# Patient Record
Sex: Female | Born: 1940 | Race: White | Hispanic: No | Smoking: Never smoker
Health system: Southern US, Community
[De-identification: ages and names within clinical notes are randomized; demographics above are authoritative.]

## PROBLEM LIST (undated history)

## (undated) DIAGNOSIS — C801 Malignant (primary) neoplasm, unspecified: Secondary | ICD-10-CM

## (undated) DIAGNOSIS — R002 Palpitations: Secondary | ICD-10-CM

## (undated) DIAGNOSIS — I509 Heart failure, unspecified: Secondary | ICD-10-CM

## (undated) DIAGNOSIS — I10 Essential (primary) hypertension: Secondary | ICD-10-CM

## (undated) DIAGNOSIS — E119 Type 2 diabetes mellitus without complications: Secondary | ICD-10-CM

## (undated) DIAGNOSIS — H9190 Unspecified hearing loss, unspecified ear: Secondary | ICD-10-CM

## (undated) HISTORY — PX: CHOLECYSTECTOMY: SHX55

## (undated) HISTORY — PX: DILATION AND CURETTAGE OF UTERUS: SHX78

## (undated) HISTORY — PX: STOMACH SURGERY: SHX791

---

## 1988-09-23 HISTORY — PX: ABDOMINAL HYSTERECTOMY: SHX81

## 2005-12-17 ENCOUNTER — Ambulatory Visit: Payer: Self-pay | Admitting: Otolaryngology

## 2006-06-24 ENCOUNTER — Ambulatory Visit: Payer: Self-pay | Admitting: Internal Medicine

## 2008-03-30 ENCOUNTER — Emergency Department: Payer: Self-pay | Admitting: Emergency Medicine

## 2008-03-30 HISTORY — PX: NECK SURGERY: SHX720

## 2008-12-22 ENCOUNTER — Ambulatory Visit: Payer: Self-pay | Admitting: Internal Medicine

## 2015-01-05 ENCOUNTER — Ambulatory Visit
Admit: 2015-01-05 | Disposition: A | Discharge status: Home / Self Care | Payer: Self-pay | Attending: Internal Medicine | Admitting: Internal Medicine

## 2015-01-12 ENCOUNTER — Ambulatory Visit
Admit: 2015-01-12 | Disposition: A | Discharge status: Home / Self Care | Payer: Self-pay | Attending: Internal Medicine | Admitting: Internal Medicine

## 2015-01-23 ENCOUNTER — Other Ambulatory Visit
Admission: RE | Admit: 2015-01-23 | Discharge: 2015-01-23 | Disposition: A | Discharge status: Home / Self Care | Payer: Medicare Other | Source: Ambulatory Visit | Attending: Urgent Care | Admitting: Urgent Care

## 2015-01-23 DIAGNOSIS — R160 Hepatomegaly, not elsewhere classified: Secondary | ICD-10-CM | POA: Insufficient documentation

## 2015-01-23 LAB — CBC
HEMATOCRIT: 33.5 % — AB (ref 35.0–47.0)
HEMOGLOBIN: 10.5 g/dL — AB (ref 12.0–16.0)
MCH: 23.4 pg — ABNORMAL LOW (ref 26.0–34.0)
MCHC: 31.4 g/dL — AB (ref 32.0–36.0)
MCV: 74.6 fL — ABNORMAL LOW (ref 80.0–100.0)
Platelets: 412 10*3/uL (ref 150–440)
RBC: 4.49 MIL/uL (ref 3.80–5.20)
RDW: 17.7 % — ABNORMAL HIGH (ref 11.5–14.5)
WBC: 16 10*3/uL — ABNORMAL HIGH (ref 3.6–11.0)

## 2015-01-23 LAB — HEPATIC FUNCTION PANEL
ALK PHOS: 52 U/L (ref 38–126)
ALT: 11 U/L — AB (ref 14–54)
AST: 15 U/L (ref 15–41)
Albumin: 3.1 g/dL — ABNORMAL LOW (ref 3.5–5.0)
Bilirubin, Direct: 0.2 mg/dL (ref 0.1–0.5)
Indirect Bilirubin: 0.4 mg/dL (ref 0.3–0.9)
Total Bilirubin: 0.6 mg/dL (ref 0.3–1.2)
Total Protein: 7 g/dL (ref 6.5–8.1)

## 2015-01-23 LAB — PROTIME-INR
INR: 1.06
Prothrombin Time: 14 seconds (ref 11.4–15.0)

## 2015-01-24 LAB — AFP TUMOR MARKER: AFP TUMOR MARKER: 2 ng/mL (ref 0.0–8.3)

## 2015-01-24 LAB — CEA (SERIAL MONITOR): CEA: 0.9 ng/mL (ref 0.0–4.7)

## 2015-02-17 ENCOUNTER — Encounter: Payer: Self-pay | Admitting: Urgent Care

## 2015-02-27 ENCOUNTER — Telehealth: Payer: Self-pay | Admitting: Urgent Care

## 2015-02-27 NOTE — Telephone Encounter (Signed)
Please call [facility] & get 5/23 notes from Consult with Dr Patsy [place].  It is not in EPIC. Thanks

## 2015-02-27 NOTE — Telephone Encounter (Signed)
Phoned Dr.Zacks office at [facility] spoke with nurse lisa who said she will fax over last progress note in May

## 2015-02-27 NOTE — Telephone Encounter (Signed)
Records have been requested by fax from Dr Patsy Baltimore

## 2015-02-28 DIAGNOSIS — E785 Hyperlipidemia, unspecified: Secondary | ICD-10-CM | POA: Insufficient documentation

## 2015-02-28 DIAGNOSIS — E039 Hypothyroidism, unspecified: Secondary | ICD-10-CM | POA: Insufficient documentation

## 2015-02-28 DIAGNOSIS — E119 Type 2 diabetes mellitus without complications: Secondary | ICD-10-CM | POA: Insufficient documentation

## 2015-02-28 NOTE — Telephone Encounter (Signed)
Dr.Zacks last progress note has been uploaded to the chart under media tab

## 2015-03-14 ENCOUNTER — Ambulatory Visit: Payer: Self-pay | Admitting: Urgent Care

## 2015-06-12 ENCOUNTER — Other Ambulatory Visit: Payer: Self-pay | Admitting: Internal Medicine

## 2015-06-12 DIAGNOSIS — R1084 Generalized abdominal pain: Secondary | ICD-10-CM

## 2015-06-14 ENCOUNTER — Ambulatory Visit
Admission: RE | Admit: 2015-06-14 | Discharge: 2015-06-14 | Disposition: A | Discharge status: Home / Self Care | Payer: Medicare Other | Source: Ambulatory Visit | Attending: Internal Medicine | Admitting: Internal Medicine

## 2015-06-14 DIAGNOSIS — Z9049 Acquired absence of other specified parts of digestive tract: Secondary | ICD-10-CM | POA: Diagnosis not present

## 2015-06-14 DIAGNOSIS — R160 Hepatomegaly, not elsewhere classified: Secondary | ICD-10-CM | POA: Diagnosis not present

## 2015-06-14 DIAGNOSIS — R1084 Generalized abdominal pain: Secondary | ICD-10-CM | POA: Diagnosis present

## 2018-01-03 ENCOUNTER — Emergency Department: Payer: Medicare Other

## 2018-01-03 ENCOUNTER — Encounter: Payer: Self-pay | Admitting: Emergency Medicine

## 2018-01-03 ENCOUNTER — Emergency Department
Admission: EM | Admit: 2018-01-03 | Discharge: 2018-01-03 | Disposition: A | Discharge status: Home / Self Care | Payer: Medicare Other | Attending: Emergency Medicine | Admitting: Emergency Medicine

## 2018-01-03 ENCOUNTER — Other Ambulatory Visit: Payer: Self-pay

## 2018-01-03 DIAGNOSIS — Z7982 Long term (current) use of aspirin: Secondary | ICD-10-CM | POA: Insufficient documentation

## 2018-01-03 DIAGNOSIS — Z7984 Long term (current) use of oral hypoglycemic drugs: Secondary | ICD-10-CM | POA: Diagnosis not present

## 2018-01-03 DIAGNOSIS — S50311A Abrasion of right elbow, initial encounter: Secondary | ICD-10-CM | POA: Diagnosis not present

## 2018-01-03 DIAGNOSIS — Y92481 Parking lot as the place of occurrence of the external cause: Secondary | ICD-10-CM | POA: Insufficient documentation

## 2018-01-03 DIAGNOSIS — Z85028 Personal history of other malignant neoplasm of stomach: Secondary | ICD-10-CM | POA: Diagnosis not present

## 2018-01-03 DIAGNOSIS — E119 Type 2 diabetes mellitus without complications: Secondary | ICD-10-CM | POA: Diagnosis not present

## 2018-01-03 DIAGNOSIS — Z23 Encounter for immunization: Secondary | ICD-10-CM | POA: Insufficient documentation

## 2018-01-03 DIAGNOSIS — Y998 Other external cause status: Secondary | ICD-10-CM | POA: Insufficient documentation

## 2018-01-03 DIAGNOSIS — T148XXA Other injury of unspecified body region, initial encounter: Secondary | ICD-10-CM | POA: Insufficient documentation

## 2018-01-03 DIAGNOSIS — I10 Essential (primary) hypertension: Secondary | ICD-10-CM | POA: Diagnosis not present

## 2018-01-03 DIAGNOSIS — S8992XA Unspecified injury of left lower leg, initial encounter: Secondary | ICD-10-CM | POA: Diagnosis present

## 2018-01-03 DIAGNOSIS — Z85828 Personal history of other malignant neoplasm of skin: Secondary | ICD-10-CM | POA: Insufficient documentation

## 2018-01-03 DIAGNOSIS — Y9301 Activity, walking, marching and hiking: Secondary | ICD-10-CM | POA: Insufficient documentation

## 2018-01-03 HISTORY — DX: Malignant (primary) neoplasm, unspecified: C80.1

## 2018-01-03 HISTORY — DX: Type 2 diabetes mellitus without complications: E11.9

## 2018-01-03 HISTORY — DX: Essential (primary) hypertension: I10

## 2018-01-03 LAB — BASIC METABOLIC PANEL
Anion gap: 8 (ref 5–15)
BUN: 10 mg/dL (ref 6–20)
CALCIUM: 8.8 mg/dL — AB (ref 8.9–10.3)
CO2: 27 mmol/L (ref 22–32)
CREATININE: 0.78 mg/dL (ref 0.44–1.00)
Chloride: 102 mmol/L (ref 101–111)
GFR calc Af Amer: >60 mL/min (ref >60)
GFR calc non Af Amer: >60 mL/min (ref >60)
GLUCOSE: 169 mg/dL — AB (ref 65–99)
Potassium: 3.5 mmol/L (ref 3.5–5.1)
Sodium: 137 mmol/L (ref 135–145)

## 2018-01-03 LAB — CBC WITH DIFFERENTIAL/PLATELET
Basophils Absolute: 0.1 10*3/uL (ref 0–0.1)
Basophils Relative: 1 %
EOS ABS: 0.2 10*3/uL (ref 0–0.7)
Eosinophils Relative: 2 %
HEMATOCRIT: 36.1 % (ref 35.0–47.0)
Hemoglobin: 11.8 g/dL — ABNORMAL LOW (ref 12.0–16.0)
Lymphocytes Relative: 16 %
Lymphs Abs: 1.7 10*3/uL (ref 1.0–3.6)
MCH: 25.5 pg — ABNORMAL LOW (ref 26.0–34.0)
MCHC: 32.6 g/dL (ref 32.0–36.0)
MCV: 78.4 fL — ABNORMAL LOW (ref 80.0–100.0)
MONO ABS: 0.5 10*3/uL (ref 0.2–0.9)
MONOS PCT: 4 %
Neutro Abs: 7.9 10*3/uL — ABNORMAL HIGH (ref 1.4–6.5)
Neutrophils Relative %: 77 %
Platelets: 286 10*3/uL (ref 150–440)
RBC: 4.61 MIL/uL (ref 3.80–5.20)
RDW: 17.3 % — AB (ref 11.5–14.5)
WBC: 10.3 10*3/uL (ref 3.6–11.0)

## 2018-01-03 MED ORDER — TETANUS-DIPHTH-ACELL PERTUSSIS 5-2.5-18.5 LF-MCG/0.5 IM SUSP
0.5000 mL | Freq: Once | INTRAMUSCULAR | Status: AC
  Administered 2018-01-03: 0.5 mL via INTRAMUSCULAR
  Filled 2018-01-03: qty 0.5
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
## 2018-01-03 NOTE — ED Notes (Signed)
Patient transported to radiology

## 2018-01-03 NOTE — ED Notes (Signed)
Patient transported to XR. 
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
## 2018-01-03 NOTE — Discharge Instructions (Addendum)
Rest and take it easy use Tylenol for pain. Follow-up with your regular doctor later this week.

## 2018-01-03 NOTE — ED Notes (Signed)
Pt refusing pain meds at this time and stating "I just want to go home."

## 2020-02-24 ENCOUNTER — Ambulatory Visit (INDEPENDENT_AMBULATORY_CARE_PROVIDER_SITE_OTHER): Payer: Medicare Other | Admitting: Internal Medicine

## 2020-02-24 ENCOUNTER — Other Ambulatory Visit: Payer: Self-pay

## 2020-02-24 ENCOUNTER — Encounter: Payer: Self-pay | Admitting: Internal Medicine

## 2020-02-24 VITALS — BP 145/62 | HR 92 | Wt 199.9 lb

## 2020-02-24 DIAGNOSIS — I10 Essential (primary) hypertension: Secondary | ICD-10-CM | POA: Diagnosis not present

## 2020-02-24 DIAGNOSIS — E0840 Diabetes mellitus due to underlying condition with diabetic neuropathy, unspecified: Secondary | ICD-10-CM | POA: Insufficient documentation

## 2020-02-24 DIAGNOSIS — J301 Allergic rhinitis due to pollen: Secondary | ICD-10-CM | POA: Diagnosis not present

## 2020-02-24 DIAGNOSIS — H9193 Unspecified hearing loss, bilateral: Secondary | ICD-10-CM | POA: Insufficient documentation

## 2020-02-24 HISTORY — DX: Morbid (severe) obesity due to excess calories: E66.01

## 2020-02-24 NOTE — Assessment & Plan Note (Signed)
Stable

## 2020-02-24 NOTE — Assessment & Plan Note (Signed)
Blood pressure is stable at the present time he is following low-salt diet.

## 2020-02-24 NOTE — Assessment & Plan Note (Signed)
Need to lose weight.

## 2020-02-24 NOTE — Assessment & Plan Note (Signed)
Due to senile otosclerosis

## 2020-02-24 NOTE — Assessment & Plan Note (Signed)
Patient is seen endocrinologist for the control of diabetes.

## 2020-02-24 NOTE — Progress Notes (Signed)
Patient ID: Tanya Frye, female   DOB: [birth year], 79 y.o.   MRN: [id number]    Established Patient Office Visit  Subjective:  Patient ID: Tanya Frye, female    DOB: 09/25/1940  Age: 79 y.o. MRN: [id number]  CC:  Chief Complaint  Patient presents with  . Diabetes    patient is here today for 3 month fu on diabetes. Patient states that her blood sugar is stable and today from home it was 123.    HPI  Tanya Frye presents for a 3 month follow up regarding her diabetes. Her blood sugars have been stable and today at home it was 123. She does not take insulin but is on Metformin. She brought her blood sugar log with her today. She reports that she cannot walk well or hear well and that she has cataracts. The patient denies chest pain, hip pain, and any other symptoms or complaints at this time.  Past Medical History:  Diagnosis Date  . 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 Diabetes mellitus without complication ([place])   . Hypertension     Past Surgical History:  Procedure Laterality Date  . ABDOMINAL HYSTERECTOMY    . NECK SURGERY     Per family, pt broke her neck  . STOMACH SURGERY     tumor removed, per family    History reviewed. No pertinent family history.  Social History   Socioeconomic History  . Marital status: Married    Spouse name: Not on file  . Number of children: Not on file  . Years of education: Not on file  . Highest education level: Not on file  Occupational History  . Not on file  Tobacco Use  . Smoking status: Never Smoker  . Smokeless tobacco: Never Used  Substance and Sexual Activity  . Alcohol use: Never  . Drug use: Never  . Sexual activity: Not Currently  Other Topics Concern  . Not on file  Social History Narrative  . Not on file   Social Determinants of Health   Financial Resource Strain:   . Difficulty of Paying Living Expenses:   Food Insecurity:   . Worried About Charity fundraiser in the Last Year:   . Arboriculturist in  the Last Year:   Transportation Needs:   . Film/video editor (Medical):   Marland Kitchen Lack of Transportation (Non-Medical):   Physical Activity:   . Days of Exercise per Week:   . Minutes of Exercise per Session:   Stress:   . Feeling of Stress :   Social Connections:   . Frequency of Communication with Friends and Family:   . Frequency of Social Gatherings with Friends and Family:   . Attends Religious Services:   . Active Member of Clubs or Organizations:   . Attends Archivist Meetings:   Marland Kitchen Marital Status:   Intimate Partner Violence:   . Fear of Current or Ex-Partner:   . Emotionally Abused:   Marland Kitchen Physically Abused:   . Sexually Abused:      Current Outpatient Medications:  .  aspirin EC 81 MG tablet, Take 1 tablet by mouth daily., Disp: , Rfl:  .  furosemide (LASIX) 40 MG tablet, Take 40 mg by mouth daily., Disp: , Rfl: 3 .  glipiZIDE (GLUCOTROL XL) 5 MG 24 hr tablet, Take 3 tablets by mouth daily., Disp: , Rfl:  .  levothyroxine (SYNTHROID, LEVOTHROID) 88 MCG tablet, Take 88  mcg by mouth every morning., Disp: , Rfl: 0 .  loratadine (CLARITIN) 10 MG tablet, Take 1 tablet by mouth daily., Disp: , Rfl:  .  losartan (COZAAR) 50 MG tablet, Take 50 mg by mouth daily., Disp: , Rfl: 3 .  lovastatin (MEVACOR) 10 MG tablet, TAKE 1 TABLET ONCE A DAY ORALLY, Disp: , Rfl: 0 .  metFORMIN (GLUCOPHAGE) 1000 MG tablet, Take 1 tablet by mouth 2 (two) times daily., Disp: , Rfl: 0 .  Omega-3 Fatty Acids (FISH OIL) 500 MG CAPS, Take 1 capsule by mouth daily., Disp: , Rfl:  .  omeprazole (PRILOSEC) 20 MG capsule, Take 1 capsule by mouth daily., Disp: , Rfl: 3   Allergies  Allergen Reactions  . Penicillins Rash    ROS Review of Systems  Constitutional: Negative.   HENT: Positive for hearing loss.   Eyes:       Reports cataracts  Respiratory: Negative.   Cardiovascular: Negative.  Negative for chest pain.  Gastrointestinal: Negative.   Endocrine: Negative.   Genitourinary:  Negative.   Musculoskeletal: Positive for gait problem.  Skin: Negative.   Allergic/Immunologic: Negative.   Hematological: Negative.   Psychiatric/Behavioral: Negative.      Objective:    Physical Exam  Constitutional: The patient is oriented to person, place, and time. Pt appears well-developed and well-nourished.  Head: Normocephalic and atraumatic. Patient cannot hear well. Eyes: Pupils are equal, round, and reactive to light.  Neck: No JVD present. No tracheal deviation present. No thyromegaly present.  Cardiovascular: Regular rate and rhythm. No gallop. Pulmonary/Chest: Normal breath sounds. Lungs clear to auscultation. Abdominal: Soft. No abdominal tenderness. No guarding or rebound tenderness.. Musculoskeletal: Normal range of motion.  Lymphatic: No cervical adenopathy.  Neurological: No cranial nerve deficit.  Skin: Skin is warm and hydrated. No ankle edema. Psychiatric: The patient has a normal mood and affect.  BP (!) 145/62   Pulse 92   Wt 199 lb 14.4 oz (90.7 kg)   BMI 34.86 kg/m  Wt Readings from Last 3 Encounters:  02/24/20 199 lb 14.4 oz (90.7 kg)  01/03/18 208 lb (94.3 kg)     Health Maintenance Due  Topic Date Due  . HEMOGLOBIN A1C  Never done  . FOOT EXAM  Never done  . OPHTHALMOLOGY EXAM  Never done  . COVID-19 Vaccine (1) Never done  . DEXA SCAN  Never done  . PNA vac Low Risk Adult (1 of 2 - PCV13) Never done    There are no preventive care reminders to display for this patient.  No results found for: TSH Lab Results  Component Value Date   WBC 10.3 01/03/2018   HGB 11.8 (L) 01/03/2018   HCT 36.1 01/03/2018   MCV 78.4 (L) 01/03/2018   PLT 286 01/03/2018   Lab Results  Component Value Date   NA 137 01/03/2018   K 3.5 01/03/2018   CO2 27 01/03/2018   GLUCOSE 169 (H) 01/03/2018   BUN 10 01/03/2018   CREATININE 0.78 01/03/2018   BILITOT 0.6 01/23/2015   ALKPHOS 52 01/23/2015   AST 15 01/23/2015   ALT 11 (L) 01/23/2015   PROT 7.0  01/23/2015   ALBUMIN 3.1 (L) 01/23/2015   CALCIUM 8.8 (L) 01/03/2018   ANIONGAP 8 01/03/2018   No results found for: CHOL No results found for: HDL No results found for: LDLCALC No results found for: TRIG No results found for: CHOLHDL No results found for: HGBA1C    Assessment & Plan:   Problem  List Items Addressed This Visit      Cardiovascular and Mediastinum   Essential hypertension - Primary    Blood pressure is stable at the present timeshe  is following low-salt diet.        Respiratory   Seasonal allergic rhinitis due to pollen    Stable.        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      Relevant Medications   glipiZIDE (GLUCOTROL XL) 5 MG 24 hr tablet      No orders of the defined types were placed in this encounter.   Follow-up: Return in about 3 months (around 05/26/2020).   By signing my name below, I, De Burrs, attest that this documentation has been prepared under the direction and in the presence of Cletis Athens, MD. Electronically Signed: De Burrs, Medical Scribe. I personally performed the services described in this documentation, which was SCRIBED in my presence. The recorded information has been reviewed and considered accurate. It has been edited as necessary during review. Cletis Athens, MD
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
## 2020-03-25 ENCOUNTER — Other Ambulatory Visit: Payer: Self-pay

## 2020-03-25 MED ORDER — LORATADINE 10 MG PO TABS: 10.0000 mg | ORAL_TABLET | Freq: Every day | ORAL | 2 refills | Status: DC

## 2020-04-13 ENCOUNTER — Other Ambulatory Visit: Payer: Self-pay | Admitting: *Deleted

## 2020-04-13 ENCOUNTER — Telehealth: Payer: Self-pay | Admitting: *Deleted

## 2020-04-13 NOTE — Telephone Encounter (Signed)
Patient needs letter to request her mail box to be moved.

## 2020-05-30 ENCOUNTER — Ambulatory Visit (INDEPENDENT_AMBULATORY_CARE_PROVIDER_SITE_OTHER): Payer: Medicare Other | Admitting: Internal Medicine

## 2020-05-30 ENCOUNTER — Other Ambulatory Visit: Payer: Self-pay

## 2020-05-30 ENCOUNTER — Encounter: Payer: Self-pay | Admitting: Internal Medicine

## 2020-05-30 VITALS — BP 145/63 | HR 87 | Ht 63.0 in | Wt 196.8 lb

## 2020-05-30 DIAGNOSIS — H9193 Unspecified hearing loss, bilateral: Secondary | ICD-10-CM

## 2020-05-30 DIAGNOSIS — E0840 Diabetes mellitus due to underlying condition with diabetic neuropathy, unspecified: Secondary | ICD-10-CM

## 2020-05-30 DIAGNOSIS — Z23 Encounter for immunization: Secondary | ICD-10-CM | POA: Diagnosis not present

## 2020-05-30 DIAGNOSIS — I10 Essential (primary) hypertension: Secondary | ICD-10-CM | POA: Diagnosis not present

## 2020-05-30 MED ORDER — LORATADINE 10 MG PO TABS: 10.0000 mg | ORAL_TABLET | Freq: Every day | ORAL | 2 refills | Status: DC

## 2020-05-30 MED ORDER — GLIPIZIDE ER 5 MG PO TB24: 15.0000 mg | ORAL_TABLET | Freq: Every day | ORAL | 3 refills | Status: DC

## 2020-05-30 MED ORDER — OMEPRAZOLE 20 MG PO CPDR: 20.0000 mg | DELAYED_RELEASE_CAPSULE | Freq: Every day | ORAL | 3 refills | Status: DC

## 2020-05-30 MED ORDER — METFORMIN HCL 1000 MG PO TABS: 1000.0000 mg | ORAL_TABLET | Freq: Two times a day (BID) | ORAL | 3 refills | Status: DC

## 2020-05-30 NOTE — Assessment & Plan Note (Signed)
bs  Is 89

## 2020-05-30 NOTE — Progress Notes (Signed)
Established Patient Office Visit  SUBJECTIVE:  Subjective  Patient ID: Tanya Frye, female    DOB: July 11, 1941  Age: 79 y.o. MRN: [id number]  CC:  Chief Complaint  Patient presents with  . Diabetes    patient checked blood sugar at home and it was 138  . Hypertension    HPI Tanya Frye is a 79 y.o. female presenting today for a routine diabetes and hypertension check.  Her husband passed recently. She got her mailbox moved to her front door so that she doesn't have to walk out to the end of the driveway; this has been a big help to her.   Today, her blood sugar was 138 at home. Her blood pressure today was 145/63. She feels ok overall.   Past Medical History:  Diagnosis Date  . 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 Diabetes mellitus without complication ([place])   . Hypertension     Past Surgical History:  Procedure Laterality Date  . ABDOMINAL HYSTERECTOMY    . NECK SURGERY     Per family, pt broke her neck  . STOMACH SURGERY     tumor removed, per family    No family history on file.  Social History   Socioeconomic History  . Marital status: Married    Spouse name: Not on file  . Number of children: Not on file  . Years of education: Not on file  . Highest education level: Not on file  Occupational History  . Not on file  Tobacco Use  . Smoking status: Never Smoker  . Smokeless tobacco: Never Used  Vaping Use  . Vaping Use: Never assessed  Substance and Sexual Activity  . Alcohol use: Never  . Drug use: Never  . Sexual activity: Not Currently  Other Topics Concern  . Not on file  Social History Narrative  . Not on file   Social Determinants of Health   Financial Resource Strain:   . Difficulty of Paying Living Expenses: Not on file  Food Insecurity:   . Worried About Charity fundraiser in the Last Year: Not on file  . Ran Out of Food in the Last Year: Not on file  Transportation Needs:   . Lack of Transportation (Medical): Not on file  .  Lack of Transportation (Non-Medical): Not on file  Physical Activity:   . Days of Exercise per Week: Not on file  . Minutes of Exercise per Session: Not on file  Stress:   . Feeling of Stress : Not on file  Social Connections:   . Frequency of Communication with Friends and Family: Not on file  . Frequency of Social Gatherings with Friends and Family: Not on file  . Attends Religious Services: Not on file  . Active Member of Clubs or Organizations: Not on file  . Attends Archivist Meetings: Not on file  . Marital Status: Not on file  Intimate Partner Violence:   . Fear of Current or Ex-Partner: Not on file  . Emotionally Abused: Not on file  . Physically Abused: Not on file  . Sexually Abused: Not on file     Current Outpatient Medications:  .  aspirin EC 81 MG tablet, Take 1 tablet by mouth daily., Disp: , Rfl:  .  furosemide (LASIX) 40 MG tablet, Take 40 mg by mouth daily., Disp: , Rfl: 3 .  glipiZIDE (GLUCOTROL XL) 5 MG 24 hr tablet, Take 3 tablets (15 mg total)  by mouth daily., Disp: 90 tablet, Rfl: 3 .  levothyroxine (SYNTHROID, LEVOTHROID) 88 MCG tablet, Take 88 mcg by mouth every morning., Disp: , Rfl: 0 .  loratadine (CLARITIN) 10 MG tablet, Take 1 tablet (10 mg total) by mouth daily., Disp: 30 tablet, Rfl: 2 .  losartan (COZAAR) 50 MG tablet, Take 50 mg by mouth daily., Disp: , Rfl: 3 .  lovastatin (MEVACOR) 10 MG tablet, TAKE 1 TABLET ONCE A DAY ORALLY, Disp: , Rfl: 0 .  metFORMIN (GLUCOPHAGE) 1000 MG tablet, Take 1 tablet (1,000 mg total) by mouth 2 (two) times daily., Disp: 90 tablet, Rfl: 3 .  Omega-3 Fatty Acids (FISH OIL) 500 MG CAPS, Take 1 capsule by mouth daily., Disp: , Rfl:  .  omeprazole (PRILOSEC) 20 MG capsule, Take 1 capsule (20 mg total) by mouth daily., Disp: 90 capsule, Rfl: 3   Allergies  Allergen Reactions  . Penicillins Rash    ROS Review of Systems  Constitutional: Negative.   HENT: Negative.   Eyes: Negative.   Respiratory:  Negative.   Cardiovascular: Negative.   Gastrointestinal: Negative.   Endocrine: Negative.   Genitourinary: Negative.   Musculoskeletal: Negative.   Skin: Negative.   Allergic/Immunologic: Negative.   Neurological: Negative.   Hematological: Negative.   Psychiatric/Behavioral: Negative.   All other systems reviewed and are negative.    OBJECTIVE:    Physical Exam Vitals reviewed.  Constitutional:      Appearance: Normal appearance. She is obese.  HENT:     Right Ear: Decreased hearing noted.     Left Ear: Decreased hearing noted.     Mouth/Throat:     Mouth: Mucous membranes are moist.  Eyes:     Pupils: Pupils are equal, round, and reactive to light.  Neck:     Vascular: No carotid bruit.  Cardiovascular:     Rate and Rhythm: Normal rate and regular rhythm.     Pulses: Normal pulses.     Heart sounds: Normal heart sounds.  Pulmonary:     Effort: Pulmonary effort is normal.     Breath sounds: Normal breath sounds.  Abdominal:     General: Bowel sounds are normal.     Palpations: Abdomen is soft. There is no hepatomegaly, splenomegaly or mass.     Tenderness: There is no abdominal tenderness.     Hernia: No hernia is present.  Musculoskeletal:        General: No tenderness.     Cervical back: Neck supple.     Right lower leg: 1+ Edema present.     Left lower leg: 1+ Edema present.  Skin:    Findings: No rash.  Neurological:     Mental Status: She is alert and oriented to person, place, and time.     Motor: No weakness.  Psychiatric:        Mood and Affect: Mood and affect normal.        Behavior: Behavior normal.     BP (!) 145/63   Pulse 87   Ht 5\' 3"  (1.6 m)   Wt 196 lb 12.8 oz (89.3 kg)   BMI 34.86 kg/m  Wt Readings from Last 3 Encounters:  05/30/20 196 lb 12.8 oz (89.3 kg)  02/24/20 199 lb 14.4 oz (90.7 kg)  01/03/18 208 lb (94.3 kg)    Health Maintenance Due  Topic Date Due  . HEMOGLOBIN A1C  Never done  . Hepatitis C Screening  Never done    . FOOT EXAM  Never done  . OPHTHALMOLOGY EXAM  Never done  . COVID-19 Vaccine (1) Never done  . DEXA SCAN  Never done  . PNA vac Low Risk Adult (1 of 2 - PCV13) Never done    There are no preventive care reminders to display for this patient.  CBC Latest Ref Rng & Units 01/03/2018 01/23/2015  WBC 3.6 - 11.0 K/uL 10.3 16.0(H)  Hemoglobin 12.0 - 16.0 g/dL 11.8(L) 10.5(L)  Hematocrit 35 - 47 % 36.1 33.5(L)  Platelets 150 - 440 K/uL 286 412   CMP Latest Ref Rng & Units 01/03/2018 01/23/2015  Glucose 65 - 99 mg/dL 169(H) -  BUN 6 - 20 mg/dL 10 -  Creatinine 0.44 - 1.00 mg/dL 0.78 -  Sodium 135 - 145 mmol/L 137 -  Potassium 3.5 - 5.1 mmol/L 3.5 -  Chloride 101 - 111 mmol/L 102 -  CO2 22 - 32 mmol/L 27 -  Calcium 8.9 - 10.3 mg/dL 8.8(L) -  Total Protein 6.5 - 8.1 g/dL - 7.0  Total Bilirubin 0.3 - 1.2 mg/dL - 0.6  Alkaline Phos 38 - 126 U/L - 52  AST 15 - 41 U/L - 15  ALT 14 - 54 U/L - 11(L)    No results found for: TSH Lab Results  Component Value Date   ALBUMIN 3.1 (L) 01/23/2015   ANIONGAP 8 01/03/2018   No results found for: CHOL, HDL, LDLCALC, CHOLHDL No results found for: TRIG No results found for: HGBA1C    ASSESSMENT & PLAN:   Problem List Items Addressed This Visit      Cardiovascular and Mediastinum   Essential hypertension    - Today, the patient's blood pressure is well managed on losartan. - The patient will continue the current treatment regimen.  - I encouraged the patient to eat a low-sodium diet to help control blood pressure. - I encouraged the patient to live an active lifestyle and complete activities that increases heart rate to 85% target heart rate at least 5 times per week for one hour.            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  - I educated them on making healthy dietary choices including eating more fruits and vegetables and less fried foods. - I encouraged the patient to exercise more, and educated on the benefits of exercise including weight loss, diabetes management, and hypertension management.        Relevant Medications   glipiZIDE (GLUCOTROL XL) 5 MG 24 hr tablet   metFORMIN (GLUCOPHAGE) 1000 MG tablet   Need for influenza vaccination - Primary    Pt received a high dose flu shot.       Relevant Orders   Flu Vaccine QUAD High Dose(Fluad) (Completed)      Meds ordered this encounter  Medications  . glipiZIDE (GLUCOTROL XL) 5 MG 24 hr tablet    Sig: Take 3 tablets (15 mg total) by mouth daily.    Dispense:  90 tablet    Refill:  3  . metFORMIN (GLUCOPHAGE)  1000 MG tablet    Sig: Take 1 tablet (1,000 mg total) by mouth 2 (two) times daily.    Dispense:  90 tablet    Refill:  3  . loratadine (CLARITIN) 10 MG tablet    Sig: Take 1 tablet (10 mg total) by mouth daily.    Dispense:  30 tablet    Refill:  2  . omeprazole (PRILOSEC) 20 MG capsule    Sig: Take 1 capsule (20 mg total) by mouth daily.    Dispense:  90 capsule    Refill:  3     Follow-up: Return in about 3 months (around 08/29/2020) for Follow Up, DM HTN.    Dr. Jane Canary [facility] [address], [place], [place] [address]   By signing my name below, I, [employer], attest that this documentation has been prepared under the direction and in the presence of Cletis Athens, MD. Electronically Signed: Cletis Athens, MD 05/30/20, 2:34 PM   I personally performed the services described in this documentation, which was SCRIBED in my presence. The recorded information has been reviewed and considered accurate. It has been edited as necessary during review. Cletis Athens, MD

## 2020-05-30 NOTE — Assessment & Plan Note (Signed)
-   I encouraged the patient to lose weight.  - I educated them on making healthy dietary choices including eating more fruits and vegetables and less fried foods. - I encouraged the patient to exercise more, and educated on the benefits of exercise including weight loss, diabetes management, and hypertension management.   

## 2020-05-30 NOTE — Assessment & Plan Note (Signed)
Pt received a high dose flu shot.

## 2020-05-30 NOTE — Assessment & Plan Note (Signed)
ch problem 

## 2020-05-30 NOTE — Assessment & Plan Note (Signed)
-   Today, the patient's blood pressure is well managed on losartan. - The patient will continue the current treatment regimen.  - I encouraged the patient to eat a low-sodium diet to help control blood pressure. - I encouraged the patient to live an active lifestyle and complete activities that increases heart rate to 85% target heart rate at least 5 times per week for one hour.     

## 2020-08-21 ENCOUNTER — Other Ambulatory Visit: Payer: Self-pay | Admitting: Internal Medicine

## 2020-08-29 ENCOUNTER — Other Ambulatory Visit: Payer: Self-pay

## 2020-08-29 ENCOUNTER — Encounter: Payer: Self-pay | Admitting: Internal Medicine

## 2020-08-29 ENCOUNTER — Ambulatory Visit (INDEPENDENT_AMBULATORY_CARE_PROVIDER_SITE_OTHER): Payer: Medicare Other | Admitting: Internal Medicine

## 2020-08-29 VITALS — BP 134/75 | HR 109 | Ht 63.5 in | Wt 200.9 lb

## 2020-08-29 DIAGNOSIS — I10 Essential (primary) hypertension: Secondary | ICD-10-CM | POA: Diagnosis not present

## 2020-08-29 DIAGNOSIS — R000 Tachycardia, unspecified: Secondary | ICD-10-CM | POA: Diagnosis not present

## 2020-08-29 DIAGNOSIS — H9193 Unspecified hearing loss, bilateral: Secondary | ICD-10-CM | POA: Diagnosis not present

## 2020-08-29 MED ORDER — METOPROLOL SUCCINATE ER 50 MG PO TB24: 50.0000 mg | ORAL_TABLET | Freq: Every day | ORAL | 3 refills | Status: DC

## 2020-08-29 NOTE — Assessment & Plan Note (Signed)
Patient complaining of tachycardia.  I did an EKG on her.  EKG did not show any acute changes suggestive of old anteroseptal myocardial infarction patient was started on metoprolol 50 mg p.o. daily.

## 2020-08-29 NOTE — Assessment & Plan Note (Signed)
Patient is seen ENT for hearing aid.

## 2020-08-29 NOTE — Progress Notes (Signed)
Established Patient Office Visit  Subjective:  Patient ID: Tanya Frye, female    DOB: Nov 07, 1940  Age: 79 y.o. MRN: [id number]  CC:  Chief Complaint  Patient presents with  . Diabetes  . Hypertension    Diabetes She presents for her follow-up diabetic visit. She has type 2 diabetes mellitus. Hypoglycemia symptoms include nervousness/anxiousness. Pertinent negatives for hypoglycemia include no dizziness. Pertinent negatives for diabetes include no blurred vision, no chest pain, no fatigue, no foot paresthesias, no weakness and no weight loss. Pertinent negatives for diabetic complications include no CVA or heart disease. Risk factors for coronary artery disease include post-menopausal, hypertension and obesity. She is compliant with treatment most of the time. She is following a high fiber and low fat/cholesterol diet. She has had a previous visit with a dietitian.  Hypertension The problem has been waxing and waning since onset. The problem is controlled. Associated symptoms include anxiety and palpitations. Pertinent negatives include no blurred vision or chest pain. There is no history of CVA.  Palpitations  The current episode started in the past 7 days. Nothing aggravates the symptoms. Associated symptoms include anxiety. Pertinent negatives include no chest pain, dizziness or weakness. There is no history of heart disease.   Patient has a history of hepatobiliary cystadenoma hyperlipidemia hypothyroidism and also has a history of lymphocytopenia. Tanya Frye presents for palpitation, she has a seasonal allergic rhinitis.   Past Medical History:  Diagnosis Date  . 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 Diabetes mellitus without complication ([place])   . Hypertension     Past Surgical History:  Procedure Laterality Date  . ABDOMINAL HYSTERECTOMY    . NECK SURGERY     Per family, pt broke her neck  . STOMACH SURGERY     tumor removed, per family    History reviewed. No  pertinent family history.  Social History   Socioeconomic History  . Marital status: Married    Spouse name: Not on file  . Number of children: Not on file  . Years of education: Not on file  . Highest education level: Not on file  Occupational History  . Not on file  Tobacco Use  . Smoking status: Never Smoker  . Smokeless tobacco: Never Used  Vaping Use  . Vaping Use: Never assessed  Substance and Sexual Activity  . Alcohol use: Never  . Drug use: Never  . Sexual activity: Not Currently  Other Topics Concern  . Not on file  Social History Narrative  . Not on file   Social Determinants of Health   Financial Resource Strain:   . Difficulty of Paying Living Expenses: Not on file  Food Insecurity:   . Worried About Charity fundraiser in the Last Year: Not on file  . Ran Out of Food in the Last Year: Not on file  Transportation Needs:   . Lack of Transportation (Medical): Not on file  . Lack of Transportation (Non-Medical): Not on file  Physical Activity:   . Days of Exercise per Week: Not on file  . Minutes of Exercise per Session: Not on file  Stress:   . Feeling of Stress : Not on file  Social Connections:   . Frequency of Communication with Friends and Family: Not on file  . Frequency of Social Gatherings with Friends and Family: Not on file  . Attends Religious Services: Not on file  . Active Member of Clubs or Organizations: Not on file  .  Attends Archivist Meetings: Not on file  . Marital Status: Not on file  Intimate Partner Violence:   . Fear of Current or Ex-Partner: Not on file  . Emotionally Abused: Not on file  . Physically Abused: Not on file  . Sexually Abused: Not on file     Current Outpatient Medications:  .  aspirin EC 81 MG tablet, Take 1 tablet by mouth daily., Disp: , Rfl:  .  furosemide (LASIX) 40 MG tablet, Take 40 mg by mouth daily., Disp: , Rfl: 3 .  glipiZIDE (GLUCOTROL XL) 5 MG 24 hr tablet, TAKE 3 TABLETS BY MOUTH DAILY,  Disp: 270 tablet, Rfl: 1 .  levothyroxine (SYNTHROID, LEVOTHROID) 88 MCG tablet, Take 88 mcg by mouth every morning., Disp: , Rfl: 0 .  loratadine (CLARITIN) 10 MG tablet, TAKE 1 TABLET BY MOUTH EVERY DAY, Disp: 90 tablet, Rfl: 3 .  losartan (COZAAR) 50 MG tablet, Take 50 mg by mouth daily., Disp: , Rfl: 3 .  lovastatin (MEVACOR) 10 MG tablet, TAKE 1 TABLET ONCE A DAY ORALLY, Disp: , Rfl: 0 .  metFORMIN (GLUCOPHAGE) 1000 MG tablet, Take 1 tablet (1,000 mg total) by mouth 2 (two) times daily., Disp: 90 tablet, Rfl: 3 .  Omega-3 Fatty Acids (FISH OIL) 500 MG CAPS, Take 1 capsule by mouth daily., Disp: , Rfl:  .  omeprazole (PRILOSEC) 20 MG capsule, Take 1 capsule (20 mg total) by mouth daily., Disp: 90 capsule, Rfl: 3 .  metoprolol succinate (TOPROL-XL) 50 MG 24 hr tablet, Take 1 tablet (50 mg total) by mouth daily. Take with or immediately following a meal., Disp: 90 tablet, Rfl: 3   Allergies  Allergen Reactions  . Penicillins Rash    ROS Review of Systems  Constitutional: Negative.  Negative for fatigue and weight loss.  HENT: Negative.   Eyes: Negative.  Negative for blurred vision.  Respiratory: Negative.   Cardiovascular: Positive for palpitations. Negative for chest pain.  Gastrointestinal: Negative.   Endocrine: Negative.   Genitourinary: Negative.   Musculoskeletal: Negative.   Skin: Negative.   Allergic/Immunologic: Negative.   Neurological: Negative.  Negative for dizziness and weakness.  Hematological: Negative.   Psychiatric/Behavioral: The patient is nervous/anxious.   All other systems reviewed and are negative.     Objective:    Physical Exam Vitals reviewed.  Constitutional:      Appearance: Normal appearance.  HENT:     Mouth/Throat:     Mouth: Mucous membranes are moist.  Eyes:     Pupils: Pupils are equal, round, and reactive to light.  Neck:     Vascular: No carotid bruit.  Cardiovascular:     Rate and Rhythm: Normal rate and regular rhythm.      Pulses: Normal pulses.     Heart sounds: Normal heart sounds.  Pulmonary:     Effort: Pulmonary effort is normal.     Breath sounds: Normal breath sounds.  Abdominal:     General: Bowel sounds are normal.     Palpations: Abdomen is soft. There is no hepatomegaly, splenomegaly or mass.     Tenderness: There is no abdominal tenderness.     Hernia: No hernia is present.  Musculoskeletal:        General: No tenderness.     Cervical back: Neck supple.     Right lower leg: No edema.     Left lower leg: No edema.  Skin:    Findings: No rash.  Neurological:     Mental  Status: She is alert and oriented to person, place, and time.     Motor: No weakness.  Psychiatric:        Mood and Affect: Mood and affect normal.        Behavior: Behavior normal.     BP 134/75   Pulse (!) 109   Ht 5' 3.5" (1.613 m)   Wt 200 lb 14.4 oz (91.1 kg)   BMI 35.03 kg/m  Wt Readings from Last 3 Encounters:  08/29/20 200 lb 14.4 oz (91.1 kg)  05/30/20 196 lb 12.8 oz (89.3 kg)  02/24/20 199 lb 14.4 oz (90.7 kg)     Health Maintenance Due  Topic Date Due  . HEMOGLOBIN A1C  Never done  . Hepatitis C Screening  Never done  . FOOT EXAM  Never done  . OPHTHALMOLOGY EXAM  Never done  . COVID-19 Vaccine (1) Never done  . DEXA SCAN  Never done  . PNA vac Low Risk Adult (1 of 2 - PCV13) Never done    There are no preventive care reminders to display for this patient.  No results found for: TSH Lab Results  Component Value Date   WBC 10.3 01/03/2018   HGB 11.8 (L) 01/03/2018   HCT 36.1 01/03/2018   MCV 78.4 (L) 01/03/2018   PLT 286 01/03/2018   Lab Results  Component Value Date   NA 137 01/03/2018   K 3.5 01/03/2018   CO2 27 01/03/2018   GLUCOSE 169 (H) 01/03/2018   BUN 10 01/03/2018   CREATININE 0.78 01/03/2018   BILITOT 0.6 01/23/2015   ALKPHOS 52 01/23/2015   AST 15 01/23/2015   ALT 11 (L) 01/23/2015   PROT 7.0 01/23/2015   ALBUMIN 3.1 (L) 01/23/2015   CALCIUM 8.8 (L) 01/03/2018    ANIONGAP 8 01/03/2018   No results found for: CHOL No results found for: HDL No results found for: LDLCALC No results found for: TRIG No results found for: CHOLHDL No results found for: HGBA1C    Assessment & Plan:   Problem List Items Addressed This Visit      Cardiovascular and Mediastinum   Essential hypertension    Blood pressure is stable at the present medication.      Relevant Medications   metoprolol succinate (TOPROL-XL) 50 MG 24 hr tablet     Nervous and Auditory   Bilateral deafness    Patient is seen ENT for hearing aid.        Other   Morbid obesity ([place])    - I encouraged the patient to lose weight.  - I educated them on making healthy dietary choices including eating more fruits and vegetables and less fried foods. - I encouraged the patient to exercise more, and educated on the benefits of exercise including weight loss, diabetes management, and hypertension management.        Tachycardia    Patient complaining of tachycardia.  I did an EKG on her.  EKG did not show any acute changes suggestive of old anteroseptal myocardial infarction patient was started on metoprolol 50 mg p.o. daily.      Relevant Medications   metoprolol succinate (TOPROL-XL) 50 MG 24 hr tablet    Other Visit Diagnoses    Racing heart beat    -  Primary   Relevant Orders   EKG 12-Lead    EKG  REPORT  Echocardiogram revealed normal sinus tachycardia heart rate 91.  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 metoprolol succinate (TOPROL-XL) 50 MG 24 hr tablet    Sig: Take 1 tablet (50 mg total) by mouth daily. Take with or immediately following a meal.    Dispense:  90 tablet    Refill:  3    Follow-up: No follow-ups on file.    Cletis Athens, MD

## 2020-08-29 NOTE — Assessment & Plan Note (Signed)
-   I encouraged the patient to lose weight.  - I educated them on making healthy dietary choices including eating more fruits and vegetables and less fried foods. - I encouraged the patient to exercise more, and educated on the benefits of exercise including weight loss, diabetes management, and hypertension management.   

## 2020-08-29 NOTE — Assessment & Plan Note (Signed)
Blood pressure is stable at the present medication.

## 2020-09-26 ENCOUNTER — Ambulatory Visit (INDEPENDENT_AMBULATORY_CARE_PROVIDER_SITE_OTHER): Payer: Medicare Other | Admitting: Internal Medicine

## 2020-09-26 ENCOUNTER — Encounter: Payer: Self-pay | Admitting: Internal Medicine

## 2020-09-26 VITALS — BP 142/67 | HR 87 | Ht 63.5 in | Wt 204.6 lb

## 2020-09-26 DIAGNOSIS — H9193 Unspecified hearing loss, bilateral: Secondary | ICD-10-CM | POA: Diagnosis not present

## 2020-09-26 DIAGNOSIS — E0840 Diabetes mellitus due to underlying condition with diabetic neuropathy, unspecified: Secondary | ICD-10-CM | POA: Diagnosis not present

## 2020-09-26 DIAGNOSIS — J301 Allergic rhinitis due to pollen: Secondary | ICD-10-CM | POA: Diagnosis not present

## 2020-09-26 DIAGNOSIS — I10 Essential (primary) hypertension: Secondary | ICD-10-CM | POA: Diagnosis not present

## 2020-09-26 MED ORDER — GABAPENTIN 100 MG PO CAPS
100.0000 mg | ORAL_CAPSULE | Freq: Three times a day (TID) | ORAL | 3 refills | Status: DC
Start: 2020-09-26 — End: 2021-04-03

## 2020-09-26 NOTE — Assessment & Plan Note (Signed)
Take claritin 10 mg po daily 

## 2020-09-26 NOTE — Progress Notes (Signed)
Established Patient Office Visit  Subjective:  Patient ID: Tanya Frye, female    DOB: 23-Feb-1941  Age: 80 y.o. MRN: [id number]  CC:  Chief Complaint  Patient presents with  . Follow-up    Patient is here for her diabetes and hypertension follow up    HPI  Tanya Frye presents for pain in both legs  Past Medical History:  Diagnosis Date  . 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 Diabetes mellitus without complication (HCC)   . Hypertension     Past Surgical History:  Procedure Laterality Date  . ABDOMINAL HYSTERECTOMY    . NECK SURGERY     Per family, pt broke her neck  . STOMACH SURGERY     tumor removed, per family    History reviewed. No pertinent family history.  Social History   Socioeconomic History  . Marital status: Married    Spouse name: Not on file  . Number of children: Not on file  . Years of education: Not on file  . Highest education level: Not on file  Occupational History  . Not on file  Tobacco Use  . Smoking status: Never Smoker  . Smokeless tobacco: Never Used  Vaping Use  . Vaping Use: Not on file  Substance and Sexual Activity  . Alcohol use: Never  . Drug use: Never  . Sexual activity: Not Currently  Other Topics Concern  . Not on file  Social History Narrative  . Not on file   Social Determinants of Health   Financial Resource Strain: Not on file  Food Insecurity: Not on file  Transportation Needs: Not on file  Physical Activity: Not on file  Stress: Not on file  Social Connections: Not on file  Intimate Partner Violence: Not on file     Current Outpatient Medications:  .  gabapentin (NEURONTIN) 100 MG capsule, Take 1 capsule (100 mg total) by mouth 3 (three) times daily., Disp: 90 capsule, Rfl: 3 .  aspirin EC 81 MG tablet, Take 1 tablet by mouth daily., Disp: , Rfl:  .  furosemide (LASIX) 40 MG tablet, Take 40 mg by mouth daily., Disp: , Rfl: 3 .  glipiZIDE (GLUCOTROL XL) 5 MG 24 hr tablet, TAKE 3 TABLETS BY  MOUTH DAILY, Disp: 270 tablet, Rfl: 1 .  levothyroxine (SYNTHROID, LEVOTHROID) 88 MCG tablet, Take 88 mcg by mouth every morning., Disp: , Rfl: 0 .  loratadine (CLARITIN) 10 MG tablet, TAKE 1 TABLET BY MOUTH EVERY DAY, Disp: 90 tablet, Rfl: 3 .  losartan (COZAAR) 50 MG tablet, Take 50 mg by mouth daily., Disp: , Rfl: 3 .  lovastatin (MEVACOR) 10 MG tablet, TAKE 1 TABLET ONCE A DAY ORALLY, Disp: , Rfl: 0 .  metFORMIN (GLUCOPHAGE) 1000 MG tablet, Take 1 tablet (1,000 mg total) by mouth 2 (two) times daily., Disp: 90 tablet, Rfl: 3 .  metoprolol succinate (TOPROL-XL) 50 MG 24 hr tablet, Take 1 tablet (50 mg total) by mouth daily. Take with or immediately following a meal., Disp: 90 tablet, Rfl: 3 .  Omega-3 Fatty Acids (FISH OIL) 500 MG CAPS, Take 1 capsule by mouth daily., Disp: , Rfl:  .  omeprazole (PRILOSEC) 20 MG capsule, Take 1 capsule (20 mg total) by mouth daily., Disp: 90 capsule, Rfl: 3   Allergies  Allergen Reactions  . Penicillins Rash    ROS Review of Systems  Constitutional: Positive for fatigue.  HENT: Negative.  Negative for congestion.   Eyes:  Negative.  Negative for itching.  Respiratory: Negative.  Negative for choking.   Cardiovascular: Positive for leg swelling. Negative for chest pain.  Gastrointestinal: Positive for abdominal distention. Negative for constipation.  Endocrine: Negative.   Genitourinary: Negative.   Musculoskeletal: Positive for arthralgias. Negative for back pain and joint swelling.  Skin: Negative.   Allergic/Immunologic: Negative.   Neurological: Negative.  Negative for seizures and headaches.  Hematological: Negative.   Psychiatric/Behavioral: Negative.  Negative for behavioral problems.  All other systems reviewed and are negative.     Objective:    Physical Exam Cardiovascular:     Rate and Rhythm: Regular rhythm.     Heart sounds: Normal heart sounds.  Pulmonary:     Breath sounds: Normal breath sounds.  Abdominal:     General:  There is distension.     Palpations: Abdomen is soft.     Tenderness: There is no guarding.  Skin:    Coloration: Skin is not jaundiced.     Findings: No bruising, lesion or rash.  Neurological:     General: No focal deficit present.     BP (!) 142/67   Pulse 87   Ht 5' 3.5" (1.613 m)   Wt 204 lb 9.6 oz (92.8 kg)   BMI 35.67 kg/m  Wt Readings from Last 3 Encounters:  09/26/20 204 lb 9.6 oz (92.8 kg)  08/29/20 200 lb 14.4 oz (91.1 kg)  05/30/20 196 lb 12.8 oz (89.3 kg)     Health Maintenance Due  Topic Date Due  . HEMOGLOBIN A1C  Never done  . Hepatitis C Screening  Never done  . FOOT EXAM  Never done  . OPHTHALMOLOGY EXAM  Never done  . COVID-19 Vaccine (1) Never done  . DEXA SCAN  Never done  . PNA vac Low Risk Adult (1 of 2 - PCV13) Never done    There are no preventive care reminders to display for this patient.  No results found for: TSH Lab Results  Component Value Date   WBC 10.3 01/03/2018   HGB 11.8 (L) 01/03/2018   HCT 36.1 01/03/2018   MCV 78.4 (L) 01/03/2018   PLT 286 01/03/2018   Lab Results  Component Value Date   NA 137 01/03/2018   K 3.5 01/03/2018   CO2 27 01/03/2018   GLUCOSE 169 (H) 01/03/2018   BUN 10 01/03/2018   CREATININE 0.78 01/03/2018   BILITOT 0.6 01/23/2015   ALKPHOS 52 01/23/2015   AST 15 01/23/2015   ALT 11 (L) 01/23/2015   PROT 7.0 01/23/2015   ALBUMIN 3.1 (L) 01/23/2015   CALCIUM 8.8 (L) 01/03/2018   ANIONGAP 8 01/03/2018   No results found for: CHOL No results found for: HDL No results found for: LDLCALC No results found for: TRIG No results found for: CHOLHDL No results found for: HGBA1C    Assessment & Plan:   Problem List Items Addressed This Visit      Cardiovascular and Mediastinum   Essential hypertension - Primary     .  - I encouraged the patient to eat a low-sodium diet to help control blood pressure. - I encouraged the patient to live an active lifestyle and complete activities that increases  heart rate to 85% target heart rate at least 5 times per week for one hour.            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  - I educated them on making healthy dietary choices including eating more fruits and vegetables and less fried foods. - I encouraged the patient to exercise more, and educated on the benefits of exercise including weight loss, diabetes management, and hypertension management.           Meds ordered this encounter  Medications  . gabapentin (NEURONTIN) 100 MG capsule    Sig: Take 1 capsule (100 mg total) by mouth 3 (three) times daily.    Dispense:  90 capsule    Refill:  3    Follow-up: No follow-ups on file.    Cletis Athens, MD

## 2020-09-26 NOTE — Assessment & Plan Note (Signed)
.  -   I encouraged the patient to eat a low-sodium diet to help control blood pressure. - I encouraged the patient to live an active lifestyle and complete activities that increases heart rate to 85% target heart rate at least 5 times per week for one hour.     

## 2020-09-26 NOTE — Assessment & Plan Note (Signed)
We will start the patient on gabapentin 100 mg to be taken 3 times a day

## 2020-09-26 NOTE — Assessment & Plan Note (Signed)
It is a chronic problem we will refer the patient to the ENT

## 2020-09-26 NOTE — Assessment & Plan Note (Signed)
-   I encouraged the patient to lose weight.  - I educated them on making healthy dietary choices including eating more fruits and vegetables and less fried foods. - I encouraged the patient to exercise more, and educated on the benefits of exercise including weight loss, diabetes management, and hypertension management.   

## 2020-11-27 ENCOUNTER — Other Ambulatory Visit: Payer: Self-pay | Admitting: *Deleted

## 2020-11-27 MED ORDER — FUROSEMIDE 40 MG PO TABS: 40.0000 mg | ORAL_TABLET | Freq: Every day | ORAL | 3 refills | Status: DC

## 2020-12-14 ENCOUNTER — Other Ambulatory Visit: Payer: Self-pay

## 2020-12-14 ENCOUNTER — Encounter: Payer: Self-pay | Admitting: Ophthalmology

## 2020-12-18 ENCOUNTER — Other Ambulatory Visit: Payer: Self-pay

## 2020-12-18 ENCOUNTER — Other Ambulatory Visit
Admission: RE | Admit: 2020-12-18 | Discharge: 2020-12-18 | Disposition: A | Discharge status: Home / Self Care | Payer: Medicare Other | Source: Ambulatory Visit | Attending: Ophthalmology | Admitting: Ophthalmology

## 2020-12-18 DIAGNOSIS — Z20822 Contact with and (suspected) exposure to covid-19: Secondary | ICD-10-CM | POA: Insufficient documentation

## 2020-12-18 DIAGNOSIS — Z01812 Encounter for preprocedural laboratory examination: Secondary | ICD-10-CM | POA: Diagnosis present

## 2020-12-18 LAB — SARS CORONAVIRUS 2 (TAT 6-24 HRS): SARS Coronavirus 2: NEGATIVE

## 2020-12-18 NOTE — Discharge Instructions (Signed)

## 2020-12-20 ENCOUNTER — Ambulatory Visit: Payer: Medicare Other | Admitting: Anesthesiology

## 2020-12-20 ENCOUNTER — Other Ambulatory Visit: Payer: Self-pay

## 2020-12-20 ENCOUNTER — Encounter: Payer: Self-pay | Admitting: Ophthalmology

## 2020-12-20 ENCOUNTER — Other Ambulatory Visit: Payer: Self-pay | Admitting: Internal Medicine

## 2020-12-20 ENCOUNTER — Ambulatory Visit
Admission: RE | Admit: 2020-12-20 | Discharge: 2020-12-20 | Disposition: A | Discharge status: Home / Self Care | Payer: Medicare Other | Attending: Ophthalmology | Admitting: Ophthalmology

## 2020-12-20 ENCOUNTER — Encounter
Admission: RE | Disposition: A | Discharge status: Home / Self Care | Payer: Self-pay | Source: Home / Self Care | Attending: Ophthalmology

## 2020-12-20 DIAGNOSIS — Z880 Allergy status to penicillin: Secondary | ICD-10-CM | POA: Diagnosis not present

## 2020-12-20 DIAGNOSIS — Z85028 Personal history of other malignant neoplasm of stomach: Secondary | ICD-10-CM | POA: Insufficient documentation

## 2020-12-20 DIAGNOSIS — E1136 Type 2 diabetes mellitus with diabetic cataract: Secondary | ICD-10-CM | POA: Insufficient documentation

## 2020-12-20 DIAGNOSIS — Z85828 Personal history of other malignant neoplasm of skin: Secondary | ICD-10-CM | POA: Insufficient documentation

## 2020-12-20 DIAGNOSIS — Z90710 Acquired absence of both cervix and uterus: Secondary | ICD-10-CM | POA: Diagnosis not present

## 2020-12-20 DIAGNOSIS — H2511 Age-related nuclear cataract, right eye: Secondary | ICD-10-CM | POA: Insufficient documentation

## 2020-12-20 DIAGNOSIS — I10 Essential (primary) hypertension: Secondary | ICD-10-CM | POA: Diagnosis not present

## 2020-12-20 DIAGNOSIS — Z9049 Acquired absence of other specified parts of digestive tract: Secondary | ICD-10-CM | POA: Diagnosis not present

## 2020-12-20 DIAGNOSIS — Z79890 Hormone replacement therapy (postmenopausal): Secondary | ICD-10-CM | POA: Insufficient documentation

## 2020-12-20 DIAGNOSIS — Z7982 Long term (current) use of aspirin: Secondary | ICD-10-CM | POA: Diagnosis not present

## 2020-12-20 DIAGNOSIS — Z79899 Other long term (current) drug therapy: Secondary | ICD-10-CM | POA: Insufficient documentation

## 2020-12-20 DIAGNOSIS — Z7984 Long term (current) use of oral hypoglycemic drugs: Secondary | ICD-10-CM | POA: Diagnosis not present

## 2020-12-20 HISTORY — DX: Unspecified hearing loss, unspecified ear: H91.90

## 2020-12-20 HISTORY — PX: CATARACT EXTRACTION W/PHACO: SHX586

## 2020-12-20 HISTORY — DX: Palpitations: R00.2

## 2020-12-20 LAB — GLUCOSE, CAPILLARY
Glucose-Capillary: 117 mg/dL — ABNORMAL HIGH (ref 70–99)
Glucose-Capillary: 133 mg/dL — ABNORMAL HIGH (ref 70–99)

## 2020-12-20 SURGERY — PHACOEMULSIFICATION, CATARACT, WITH IOL INSERTION
Anesthesia: Monitor Anesthesia Care | Site: Eye | Laterality: Right

## 2020-12-20 MED ORDER — EPINEPHRINE PF 1 MG/ML IJ SOLN
INTRAOCULAR | Status: DC | PRN
  Administered 2020-12-20: 73 mL via OPHTHALMIC

## 2020-12-20 MED ORDER — ACETAMINOPHEN 325 MG PO TABS: 325.0000 mg | ORAL_TABLET | ORAL | Status: DC | PRN

## 2020-12-20 MED ORDER — TETRACAINE HCL 0.5 % OP SOLN
1.0000 [drp] | OPHTHALMIC | Status: DC | PRN
  Administered 2020-12-20 (×3): 1 [drp] via OPHTHALMIC

## 2020-12-20 MED ORDER — LIDOCAINE HCL (PF) 2 % IJ SOLN
INTRAOCULAR | Status: DC | PRN
  Administered 2020-12-20: 1 mL

## 2020-12-20 MED ORDER — BRIMONIDINE TARTRATE-TIMOLOL 0.2-0.5 % OP SOLN
OPHTHALMIC | Status: DC | PRN
  Administered 2020-12-20: 1 [drp] via OPHTHALMIC

## 2020-12-20 MED ORDER — ARMC OPHTHALMIC DILATING DROPS
1.0000 "application " | OPHTHALMIC | Status: DC | PRN
  Administered 2020-12-20 (×3): 1 via OPHTHALMIC

## 2020-12-20 MED ORDER — MOXIFLOXACIN HCL 0.5 % OP SOLN
OPHTHALMIC | Status: DC | PRN
  Administered 2020-12-20: 0.2 mL via OPHTHALMIC

## 2020-12-20 MED ORDER — ONDANSETRON HCL 4 MG/2ML IJ SOLN: 4.0000 mg | Freq: Once | INTRAMUSCULAR | Status: DC | PRN

## 2020-12-20 MED ORDER — MIDAZOLAM HCL 2 MG/2ML IJ SOLN
INTRAMUSCULAR | Status: DC | PRN
  Administered 2020-12-20 (×2): .5 mg via INTRAVENOUS

## 2020-12-20 MED ORDER — ACETAMINOPHEN 160 MG/5ML PO SOLN: 325.0000 mg | ORAL | Status: DC | PRN

## 2020-12-20 MED ORDER — FENTANYL CITRATE (PF) 100 MCG/2ML IJ SOLN
INTRAMUSCULAR | Status: DC | PRN
  Administered 2020-12-20: 25 ug via INTRAVENOUS

## 2020-12-20 MED ORDER — NA HYALUR & NA CHOND-NA HYALUR 0.4-0.35 ML IO KIT
PACK | INTRAOCULAR | Status: DC | PRN
  Administered 2020-12-20: 1 mL via INTRAOCULAR

## 2020-12-20 SURGICAL SUPPLY — 20 items
CANNULA ANT/CHMB 27GA (MISCELLANEOUS) ×2 IMPLANT
GLOVE SURG LX 7.5 STRW (GLOVE) ×1
GLOVE SURG LX STRL 7.5 STRW (GLOVE) ×1 IMPLANT
GLOVE SURG TRIUMPH 8.0 PF LTX (GLOVE) ×2 IMPLANT
GOWN STRL REUS W/ TWL LRG LVL3 (GOWN DISPOSABLE) ×2 IMPLANT
GOWN STRL REUS W/TWL LRG LVL3 (GOWN DISPOSABLE) ×4
LENS IOL DIOP 12.0 (Intraocular Lens) ×2 IMPLANT
LENS IOL TECNIS MONO 12.0 (Intraocular Lens) ×1 IMPLANT
MARKER SKIN DUAL TIP RULER LAB (MISCELLANEOUS) ×2 IMPLANT
NEEDLE CAPSULORHEX 25GA (NEEDLE) ×2 IMPLANT
NEEDLE FILTER BLUNT 18X 1/2SAF (NEEDLE) ×2
NEEDLE FILTER BLUNT 18X1 1/2 (NEEDLE) ×2 IMPLANT
PACK CATARACT BRASINGTON (MISCELLANEOUS) ×2 IMPLANT
PACK EYE AFTER SURG (MISCELLANEOUS) ×2 IMPLANT
PACK OPTHALMIC (MISCELLANEOUS) ×2 IMPLANT
SOLUTION OPHTHALMIC SALT (MISCELLANEOUS) ×2 IMPLANT
SYR 3ML LL SCALE MARK (SYRINGE) ×4 IMPLANT
SYR TB 1ML LUER SLIP (SYRINGE) ×2 IMPLANT
WATER STERILE IRR 250ML POUR (IV SOLUTION) ×2 IMPLANT
WIPE NON LINTING 3.25X3.25 (MISCELLANEOUS) ×2 IMPLANT

## 2020-12-20 NOTE — Transfer of Care (Signed)
Immediate Anesthesia Transfer of Care Note  Patient: Tanya Frye  Procedure(s) Performed: CATARACT EXTRACTION PHACO AND INTRAOCULAR LENS PLACEMENT (IOC) RIGHT DIABETIC 8.13 01:17.6 10.5% (Right Eye)  Patient Location: PACU  Anesthesia Type: MAC  Level of Consciousness: awake, alert  and patient cooperative  Airway and Oxygen Therapy: Patient Spontanous Breathing   Post-op Assessment: Post-op Vital signs reviewed, Patient's Cardiovascular Status Stable, Respiratory Function Stable, Patent Airway and No signs of Nausea or vomiting  Post-op Vital Signs: Reviewed and stable  Complications: No complications documented.

## 2020-12-20 NOTE — H&P (Signed)
[name]   Primary Care Physician:  Cletis Athens, MD Ophthalmologist: Dr. Leandrew Koyanagi  Pre-Procedure History & Physical: HPI:  Tanya Frye is a 80 y.o. female here for ophthalmic surgery.   Past Medical History:  Diagnosis Date  . 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 Diabetes mellitus without complication ([place])   . HOH (hard of hearing)    extremely HOH  . Hypertension   . Palpitations    occasional related to meds/heart races    Past Surgical History:  Procedure Laterality Date  . ABDOMINAL HYSTERECTOMY  1990  . CHOLECYSTECTOMY    . DILATION AND CURETTAGE OF UTERUS  before 1990   X2  . NECK SURGERY  03/30/2008   Per family, pt broke her neck  . STOMACH SURGERY     tumor removed, per family    Prior to Admission medications   Medication Sig Start Date End Date Taking? Authorizing Provider  aspirin EC 81 MG tablet Take 1 tablet by mouth daily.   Yes [provider]  Calcium Carbonate-Vit D-Min (CALCIUM 1200 PO) Take by mouth.   Yes [provider]  furosemide (LASIX) 40 MG tablet Take 1 tablet (40 mg total) by mouth daily. 11/27/20  Yes Masoud, Viann Shove, MD  gabapentin (NEURONTIN) 100 MG capsule Take 1 capsule (100 mg total) by mouth 3 (three) times daily. 09/26/20  Yes Masoud, Viann Shove, MD  glipiZIDE (GLUCOTROL XL) 5 MG 24 hr tablet TAKE 3 TABLETS BY MOUTH DAILY 08/21/20  Yes Beckie Salts, FNP  levothyroxine (SYNTHROID, LEVOTHROID) 88 MCG tablet Take 88 mcg by mouth every morning. 12/09/17  Yes [provider]  loratadine (CLARITIN) 10 MG tablet TAKE 1 TABLET BY MOUTH EVERY DAY 08/21/20  Yes Beckie Salts, FNP  losartan (COZAAR) 50 MG tablet Take 50 mg by mouth daily. 10/05/17  Yes [provider]  lovastatin (MEVACOR) 10 MG tablet TAKE 1 TABLET ONCE A DAY ORALLY 10/20/17  Yes [provider]  metFORMIN (GLUCOPHAGE) 1000 MG tablet TAKE 1 TABLET BY MOUTH TWICE A DAY 12/20/20  Yes Masoud, Viann Shove, MD  metoprolol  succinate (TOPROL-XL) 50 MG 24 hr tablet Take 1 tablet (50 mg total) by mouth daily. Take with or immediately following a meal. 08/29/20  Yes Masoud, Viann Shove, MD  Omega-3 Fatty Acids (FISH OIL) 500 MG CAPS Take 1,000 mg by mouth daily.   Yes [provider]  omeprazole (PRILOSEC) 20 MG capsule Take 1 capsule (20 mg total) by mouth daily. 05/30/20  Yes Cletis Athens, MD    Allergies as of 10/18/2020 - Review Complete 09/26/2020  Allergen Reaction Noted  . Penicillins Rash 01/03/2018    History reviewed. No pertinent family history.  Social History   Socioeconomic History  . Marital status: Married    Spouse name: Not on file  . Number of children: Not on file  . Years of education: Not on file  . Highest education level: Not on file  Occupational History  . Not on file  Tobacco Use  . Smoking status: Never Smoker  . Smokeless tobacco: Never Used  Vaping Use  . Vaping Use: Not on file  Substance and Sexual Activity  . Alcohol use: Never  . Drug use: Never  . Sexual activity: Not Currently  Other Topics Concern  . Not on file  Social History Narrative  . Not on file   Social Determinants of Health   Financial Resource Strain: Not on file  Food Insecurity: Not on  file  Transportation Needs: Not on file  Physical Activity: Not on file  Stress: Not on file  Social Connections: Not on file  Intimate Partner Violence: Not on file    Review of Systems: See HPI, otherwise negative ROS  Physical Exam: Ht 5' 3.5" (1.613 m)   Wt 91.1 kg   BMI 35.01 kg/m  General:   Alert,  pleasant and cooperative in NAD Head:  Normocephalic and atraumatic. Lungs:  Clear to auscultation.    Heart:  Regular rate and rhythm.   Impression/Plan: Tanya Frye is here for ophthalmic surgery.  Risks, benefits, limitations, and alternatives regarding ophthalmic surgery have been reviewed with the patient.  Questions have been answered.  All parties agreeable.   Leandrew Koyanagi, MD   12/20/2020, 9:08 AM

## 2020-12-20 NOTE — Anesthesia Postprocedure Evaluation (Signed)
Anesthesia Post Note  Patient: Tanya Frye  Procedure(s) Performed: CATARACT EXTRACTION PHACO AND INTRAOCULAR LENS PLACEMENT (IOC) RIGHT DIABETIC 8.13 01:17.6 10.5% (Right Eye)     Patient location during evaluation: PACU Anesthesia Type: MAC Level of consciousness: awake Pain management: pain level controlled Vital Signs Assessment: post-procedure vital signs reviewed and stable Respiratory status: respiratory function stable Cardiovascular status: stable Postop Assessment: no apparent nausea or vomiting Anesthetic complications: no   No complications documented.  Veda Canning

## 2020-12-20 NOTE — Op Note (Signed)
  LOCATION:  [facility]   PREOPERATIVE DIAGNOSIS:    Nuclear sclerotic cataract right eye. H25.11   POSTOPERATIVE DIAGNOSIS:  Nuclear sclerotic cataract right eye.     PROCEDURE:  Phacoemusification with posterior chamber intraocular lens placement of the right eye   ULTRASOUND TIME: Procedure(s): CATARACT EXTRACTION PHACO AND INTRAOCULAR LENS PLACEMENT (IOC) RIGHT DIABETIC 8.13 01:17.6 10.5% (Right)  LENS:   Implant Name Type Inv. Item Serial No. Manufacturer Lot No. LRB No. Used Action  LENS IOL DIOP 12.0 - [id number] Intraocular Lens LENS IOL DIOP 12.0 [id number]   Right 1 Implanted         SURGEON:  Wyonia Hough, MD   ANESTHESIA:  Topical with tetracaine drops and 2% Xylocaine jelly, augmented with 1% preservative-free intracameral lidocaine.    COMPLICATIONS:  None.   DESCRIPTION OF PROCEDURE:  The patient was identified in the holding room and transported to the operating room and placed in the supine position under the operating microscope.  The right eye was identified as the operative eye and it was prepped and draped in the usual sterile ophthalmic fashion.   A 1 millimeter clear-corneal paracentesis was made at the 12:00 position.  0.5 ml of preservative-free 1% lidocaine was injected into the anterior chamber. The anterior chamber was filled with Viscoat viscoelastic.  A 2.4 millimeter keratome was used to make a near-clear corneal incision at the 9:00 position.  A curvilinear capsulorrhexis was made with a cystotome and capsulorrhexis forceps.  Balanced salt solution was used to hydrodissect and hydrodelineate the nucleus.   Phacoemulsification was then used in stop and chop fashion to remove the lens nucleus and epinucleus.  The remaining cortex was then removed using the irrigation and aspiration handpiece. Provisc was then placed into the capsular bag to distend it for lens placement.  A lens was then injected into the capsular bag.  The  remaining viscoelastic was aspirated.   Wounds were hydrated with balanced salt solution.  The anterior chamber was inflated to a physiologic pressure with balanced salt solution.  No wound leaks were noted. Vigamox 0.2 ml of a 1mg  per ml solution was injected into the anterior chamber for a dose of 0.2 mg of intracameral antibiotic at the completion of the case.   Timolol and Brimonidine drops were applied to the eye.  The patient was taken to the recovery room in stable condition without complications of anesthesia or surgery.   Wells Mabe 12/20/2020, 10:03 AM

## 2020-12-20 NOTE — Anesthesia Preprocedure Evaluation (Signed)
Anesthesia Evaluation  Patient identified by MRN, date of birth, ID band Patient awake    Reviewed: Allergy & Precautions, NPO status   Airway Mallampati: II  TM Distance: >3 FB     Dental   Pulmonary    Pulmonary exam normal        Cardiovascular hypertension,  Rhythm:Regular Rate:Normal  HLD   Neuro/Psych    GI/Hepatic   Endo/Other  diabetes, Type 2Hypothyroidism Obesity - BMI 35  Renal/GU      Musculoskeletal   Abdominal   Peds  Hematology   Anesthesia Other Findings   Reproductive/Obstetrics                             Anesthesia Physical Anesthesia Plan  ASA: III  Anesthesia Plan: MAC   Post-op Pain Management:    Induction: Intravenous  PONV Risk Score and Plan: TIVA, Midazolam and Treatment may vary due to age or medical condition  Airway Management Planned: Natural Airway and Nasal Cannula  Additional Equipment:   Intra-op Plan:   Post-operative Plan:   Informed Consent: I have reviewed the patients History and Physical, chart, labs and discussed the procedure including the risks, benefits and alternatives for the proposed anesthesia with the patient or authorized representative who has indicated his/her understanding and acceptance.       Plan Discussed with: CRNA  Anesthesia Plan Comments:         Anesthesia Quick Evaluation

## 2020-12-20 NOTE — Anesthesia Procedure Notes (Signed)
Procedure Name: MAC Date/Time: 12/20/2020 9:44 AM Performed by: Vanetta Shawl, CRNA Pre-anesthesia Checklist: Patient identified, Emergency Drugs available, Suction available, Timeout performed and Patient being monitored Patient Re-evaluated:Patient Re-evaluated prior to induction Oxygen Delivery Method: Nasal cannula Placement Confirmation: positive ETCO2

## 2020-12-21 ENCOUNTER — Encounter: Payer: Self-pay | Admitting: Ophthalmology

## 2020-12-25 ENCOUNTER — Ambulatory Visit: Payer: Medicare Other | Admitting: Internal Medicine

## 2020-12-26 ENCOUNTER — Ambulatory Visit: Payer: Medicare Other | Admitting: Internal Medicine

## 2020-12-27 ENCOUNTER — Encounter: Payer: Self-pay | Admitting: Ophthalmology

## 2021-01-01 ENCOUNTER — Other Ambulatory Visit: Payer: Medicare Other

## 2021-01-02 NOTE — Discharge Instructions (Signed)

## 2021-01-03 ENCOUNTER — Ambulatory Visit: Payer: Medicare Other | Admitting: Anesthesiology

## 2021-01-03 ENCOUNTER — Ambulatory Visit
Admission: RE | Admit: 2021-01-03 | Discharge: 2021-01-03 | Disposition: A | Discharge status: Home / Self Care | Payer: Medicare Other | Attending: Ophthalmology | Admitting: Ophthalmology

## 2021-01-03 ENCOUNTER — Other Ambulatory Visit: Payer: Self-pay

## 2021-01-03 ENCOUNTER — Encounter: Payer: Self-pay | Admitting: Ophthalmology

## 2021-01-03 ENCOUNTER — Encounter
Admission: RE | Disposition: A | Discharge status: Home / Self Care | Payer: Self-pay | Source: Home / Self Care | Attending: Ophthalmology

## 2021-01-03 DIAGNOSIS — E1136 Type 2 diabetes mellitus with diabetic cataract: Secondary | ICD-10-CM | POA: Insufficient documentation

## 2021-01-03 DIAGNOSIS — Z880 Allergy status to penicillin: Secondary | ICD-10-CM | POA: Insufficient documentation

## 2021-01-03 DIAGNOSIS — Z90710 Acquired absence of both cervix and uterus: Secondary | ICD-10-CM | POA: Insufficient documentation

## 2021-01-03 DIAGNOSIS — Z7982 Long term (current) use of aspirin: Secondary | ICD-10-CM | POA: Insufficient documentation

## 2021-01-03 DIAGNOSIS — I10 Essential (primary) hypertension: Secondary | ICD-10-CM | POA: Diagnosis not present

## 2021-01-03 DIAGNOSIS — H2512 Age-related nuclear cataract, left eye: Secondary | ICD-10-CM | POA: Diagnosis not present

## 2021-01-03 DIAGNOSIS — Z79899 Other long term (current) drug therapy: Secondary | ICD-10-CM | POA: Insufficient documentation

## 2021-01-03 DIAGNOSIS — Z79890 Hormone replacement therapy (postmenopausal): Secondary | ICD-10-CM | POA: Insufficient documentation

## 2021-01-03 DIAGNOSIS — Z85828 Personal history of other malignant neoplasm of skin: Secondary | ICD-10-CM | POA: Insufficient documentation

## 2021-01-03 DIAGNOSIS — Z7984 Long term (current) use of oral hypoglycemic drugs: Secondary | ICD-10-CM | POA: Diagnosis not present

## 2021-01-03 DIAGNOSIS — Z9049 Acquired absence of other specified parts of digestive tract: Secondary | ICD-10-CM | POA: Insufficient documentation

## 2021-01-03 DIAGNOSIS — H9190 Unspecified hearing loss, unspecified ear: Secondary | ICD-10-CM | POA: Insufficient documentation

## 2021-01-03 HISTORY — PX: CATARACT EXTRACTION W/PHACO: SHX586

## 2021-01-03 LAB — GLUCOSE, CAPILLARY: Glucose-Capillary: 117 mg/dL — ABNORMAL HIGH (ref 70–99)

## 2021-01-03 SURGERY — PHACOEMULSIFICATION, CATARACT, WITH IOL INSERTION
Anesthesia: Monitor Anesthesia Care | Site: Eye | Laterality: Left

## 2021-01-03 MED ORDER — TETRACAINE HCL 0.5 % OP SOLN
1.0000 [drp] | OPHTHALMIC | Status: DC | PRN
  Administered 2021-01-03 (×3): 1 [drp] via OPHTHALMIC

## 2021-01-03 MED ORDER — NA HYALUR & NA CHOND-NA HYALUR 0.4-0.35 ML IO KIT
PACK | INTRAOCULAR | Status: DC | PRN
  Administered 2021-01-03: 1 mL via INTRAOCULAR

## 2021-01-03 MED ORDER — LIDOCAINE HCL (PF) 2 % IJ SOLN
INTRAOCULAR | Status: DC | PRN
  Administered 2021-01-03: 2 mL

## 2021-01-03 MED ORDER — MIDAZOLAM HCL 2 MG/2ML IJ SOLN
INTRAMUSCULAR | Status: DC | PRN
  Administered 2021-01-03: 1 mg via INTRAVENOUS

## 2021-01-03 MED ORDER — MOXIFLOXACIN HCL 0.5 % OP SOLN
OPHTHALMIC | Status: DC | PRN
  Administered 2021-01-03: 0.2 mL via OPHTHALMIC

## 2021-01-03 MED ORDER — BRIMONIDINE TARTRATE-TIMOLOL 0.2-0.5 % OP SOLN
OPHTHALMIC | Status: DC | PRN
  Administered 2021-01-03: 1 [drp] via OPHTHALMIC

## 2021-01-03 MED ORDER — NEOMYCIN-POLYMYXIN-DEXAMETH 3.5-10000-0.1 OP OINT
TOPICAL_OINTMENT | OPHTHALMIC | Status: DC | PRN
  Administered 2021-01-03: 1 via OPHTHALMIC

## 2021-01-03 MED ORDER — FENTANYL CITRATE (PF) 100 MCG/2ML IJ SOLN
INTRAMUSCULAR | Status: DC | PRN
  Administered 2021-01-03: 50 ug via INTRAVENOUS

## 2021-01-03 MED ORDER — ARMC OPHTHALMIC DILATING DROPS
1.0000 "application " | OPHTHALMIC | Status: DC | PRN
  Administered 2021-01-03 (×3): 1 via OPHTHALMIC

## 2021-01-03 MED ORDER — EPINEPHRINE PF 1 MG/ML IJ SOLN
INTRAOCULAR | Status: DC | PRN
  Administered 2021-01-03: 70 mL via OPHTHALMIC

## 2021-01-03 SURGICAL SUPPLY — 18 items
CANNULA ANT/CHMB 27GA (MISCELLANEOUS) ×2 IMPLANT
GLOVE SURG TRIUMPH 8.0 PF LTX (GLOVE) ×4 IMPLANT
GOWN STRL REUS W/ TWL LRG LVL3 (GOWN DISPOSABLE) ×2 IMPLANT
GOWN STRL REUS W/TWL LRG LVL3 (GOWN DISPOSABLE) ×4
LENS IOL DIOP 13.0 (Intraocular Lens) ×2 IMPLANT
LENS IOL TECNIS MONO 13.0 (Intraocular Lens) ×1 IMPLANT
MARKER SKIN DUAL TIP RULER LAB (MISCELLANEOUS) ×2 IMPLANT
NEEDLE CAPSULORHEX 25GA (NEEDLE) ×2 IMPLANT
NEEDLE FILTER BLUNT 18X 1/2SAF (NEEDLE) ×2
NEEDLE FILTER BLUNT 18X1 1/2 (NEEDLE) ×2 IMPLANT
PACK CATARACT BRASINGTON (MISCELLANEOUS) ×2 IMPLANT
PACK EYE AFTER SURG (MISCELLANEOUS) ×2 IMPLANT
PACK OPTHALMIC (MISCELLANEOUS) ×2 IMPLANT
SOLUTION OPHTHALMIC SALT (MISCELLANEOUS) ×2 IMPLANT
SYR 3ML LL SCALE MARK (SYRINGE) ×4 IMPLANT
SYR TB 1ML LUER SLIP (SYRINGE) ×2 IMPLANT
WATER STERILE IRR 250ML POUR (IV SOLUTION) ×2 IMPLANT
WIPE NON LINTING 3.25X3.25 (MISCELLANEOUS) ×2 IMPLANT

## 2021-01-03 NOTE — H&P (Signed)
[facility]   Primary Care Physician:  Cletis Athens, MD Ophthalmologist: Dr. Leandrew Koyanagi  Pre-Procedure History & Physical: HPI:  Tanya Frye is a 80 y.o. female here for ophthalmic surgery.   Past Medical History:  Diagnosis Date  . 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 Diabetes mellitus without complication ([place])   . HOH (hard of hearing)    extremely HOH  . Hypertension   . Palpitations    occasional related to meds/heart races    Past Surgical History:  Procedure Laterality Date  . ABDOMINAL HYSTERECTOMY  1990  . CATARACT EXTRACTION W/PHACO Right 12/20/2020   Procedure: CATARACT EXTRACTION PHACO AND INTRAOCULAR LENS PLACEMENT (IOC) RIGHT DIABETIC 8.13 01:17.6 10.5%;  Surgeon: Leandrew Koyanagi, MD;  Location: [facility];  Service: Ophthalmology;  Laterality: Right;  . CHOLECYSTECTOMY    . DILATION AND CURETTAGE OF UTERUS  before 1990   X2  . NECK SURGERY  03/30/2008   Per family, pt broke her neck  . STOMACH SURGERY     tumor removed, per family    Prior to Admission medications   Medication Sig Start Date End Date Taking? Authorizing Provider  aspirin EC 81 MG tablet Take 1 tablet by mouth daily.   Yes [provider]  Calcium Carbonate-Vit D-Min (CALCIUM 1200 PO) Take by mouth.   Yes [provider]  furosemide (LASIX) 40 MG tablet Take 1 tablet (40 mg total) by mouth daily. 11/27/20  Yes Masoud, Viann Shove, MD  gabapentin (NEURONTIN) 100 MG capsule Take 1 capsule (100 mg total) by mouth 3 (three) times daily. 09/26/20  Yes Masoud, Viann Shove, MD  glipiZIDE (GLUCOTROL XL) 5 MG 24 hr tablet TAKE 3 TABLETS BY MOUTH DAILY 08/21/20  Yes Beckie Salts, FNP  levothyroxine (SYNTHROID, LEVOTHROID) 88 MCG tablet Take 88 mcg by mouth every morning. 12/09/17  Yes [provider]  loratadine (CLARITIN) 10 MG tablet TAKE 1 TABLET BY MOUTH EVERY DAY 08/21/20  Yes Beckie Salts, FNP  losartan (COZAAR) 50 MG tablet Take 50 mg by mouth  daily. 10/05/17  Yes [provider]  lovastatin (MEVACOR) 10 MG tablet TAKE 1 TABLET ONCE A DAY ORALLY 10/20/17  Yes [provider]  metFORMIN (GLUCOPHAGE) 1000 MG tablet TAKE 1 TABLET BY MOUTH TWICE A DAY 12/20/20  Yes Masoud, Viann Shove, MD  metoprolol succinate (TOPROL-XL) 50 MG 24 hr tablet Take 1 tablet (50 mg total) by mouth daily. Take with or immediately following a meal. 08/29/20  Yes Masoud, Viann Shove, MD  Omega-3 Fatty Acids (FISH OIL) 500 MG CAPS Take 1,000 mg by mouth daily.   Yes [provider]  omeprazole (PRILOSEC) 20 MG capsule Take 1 capsule (20 mg total) by mouth daily. 05/30/20  Yes Cletis Athens, MD    Allergies as of 10/18/2020 - Review Complete 09/26/2020  Allergen Reaction Noted  . Penicillins Rash 01/03/2018    History reviewed. No pertinent family history.  Social History   Socioeconomic History  . Marital status: Married    Spouse name: Not on file  . Number of children: Not on file  . Years of education: Not on file  . Highest education level: Not on file  Occupational History  . Not on file  Tobacco Use  . Smoking status: Never Smoker  . Smokeless tobacco: Never Used  Vaping Use  . Vaping Use: Not on file  Substance and Sexual Activity  . Alcohol use: Never  . Drug use: Never  . Sexual activity:  Not Currently  Other Topics Concern  . Not on file  Social History Narrative  . Not on file   Social Determinants of Health   Financial Resource Strain: Not on file  Food Insecurity: Not on file  Transportation Needs: Not on file  Physical Activity: Not on file  Stress: Not on file  Social Connections: Not on file  Intimate Partner Violence: Not on file    Review of Systems: See HPI, otherwise negative ROS  Physical Exam: BP 135/64   Pulse 73   Temp (!) 97.3 F (36.3 C)   Wt 88.9 kg   SpO2 95%   BMI 34.18 kg/m  General:   Alert,  pleasant and cooperative in NAD Head:  Normocephalic and atraumatic. Lungs:  Clear to  auscultation.    Heart:  Regular rate and rhythm. Gr III M  Impression/Plan: [name] is here for ophthalmic surgery.  Risks, benefits, limitations, and alternatives regarding ophthalmic surgery have been reviewed with the patient.  Questions have been answered.  All parties agreeable.   Leandrew Koyanagi, MD  01/03/2021, 9:39 AM

## 2021-01-03 NOTE — Anesthesia Preprocedure Evaluation (Signed)
Anesthesia Evaluation  Patient identified by MRN, date of birth, ID band Patient awake    History of Anesthesia Complications Negative for: history of anesthetic complications  Airway Mallampati: III  TM Distance: >3 FB Neck ROM: Full    Dental  (+) Upper Dentures, Lower Dentures   Pulmonary neg pulmonary ROS,    Pulmonary exam normal        Cardiovascular hypertension, Pt. on medications Normal cardiovascular exam     Neuro/Psych Hard of hearing    GI/Hepatic GERD  Medicated,  Endo/Other  diabetes, Well Controlled, Type 2Hypothyroidism BMI 34  Renal/GU      Musculoskeletal   Abdominal   Peds  Hematology   Anesthesia Other Findings   Reproductive/Obstetrics                             Anesthesia Physical Anesthesia Plan  ASA: II  Anesthesia Plan: MAC   Post-op Pain Management:    Induction: Intravenous  PONV Risk Score and Plan: 2 and Midazolam, TIVA and Treatment may vary due to age or medical condition  Airway Management Planned: Nasal Cannula and Natural Airway  Additional Equipment: None  Intra-op Plan:   Post-operative Plan:   Informed Consent: I have reviewed the patients History and Physical, chart, labs and discussed the procedure including the risks, benefits and alternatives for the proposed anesthesia with the patient or authorized representative who has indicated his/her understanding and acceptance.       Plan Discussed with: CRNA  Anesthesia Plan Comments:        Anesthesia Quick Evaluation

## 2021-01-03 NOTE — Transfer of Care (Signed)
Immediate Anesthesia Transfer of Care Note  Patient: Tanya Frye  Procedure(s) Performed: CATARACT EXTRACTION PHACO AND INTRAOCULAR LENS PLACEMENT (IOC) LEFT DIABETIC  7.37 01:15.6 9.8% (Left Eye)  Patient Location: PACU  Anesthesia Type: MAC  Level of Consciousness: awake, alert  and patient cooperative  Airway and Oxygen Therapy: Patient Spontanous Breathing and Patient connected to supplemental oxygen  Post-op Assessment: Post-op Vital signs reviewed, Patient's Cardiovascular Status Stable, Respiratory Function Stable, Patent Airway and No signs of Nausea or vomiting  Post-op Vital Signs: Reviewed and stable  Complications: No complications documented.

## 2021-01-03 NOTE — Anesthesia Procedure Notes (Signed)
Procedure Name: MAC Performed by: Morty Ortwein, CRNA Pre-anesthesia Checklist: Patient identified, Emergency Drugs available, Suction available, Timeout performed and Patient being monitored Patient Re-evaluated:Patient Re-evaluated prior to induction Oxygen Delivery Method: Nasal cannula Placement Confirmation: positive ETCO2       

## 2021-01-03 NOTE — Op Note (Signed)
OPERATIVE NOTE  Tanya Frye [id number] 01/03/2021   PREOPERATIVE DIAGNOSIS:  Nuclear sclerotic cataract left eye. H25.12   POSTOPERATIVE DIAGNOSIS:    Nuclear sclerotic cataract left eye.     PROCEDURE:  Phacoemusification with posterior chamber intraocular lens placement of the left eye  Ultrasound time: Procedure(s): CATARACT EXTRACTION PHACO AND INTRAOCULAR LENS PLACEMENT (IOC) LEFT DIABETIC  7.37 01:15.6 9.8% (Left)  LENS:   Implant Name Type Inv. Item Serial No. Manufacturer Lot No. LRB No. Used Action  LENS IOL DIOP 13.0 - [id number] Intraocular Lens LENS IOL DIOP 13.0 [id number] JOHNSON   Left 1 Implanted      SURGEON:  Wyonia Hough, MD   ANESTHESIA:  Topical with tetracaine drops and 2% Xylocaine jelly, augmented with 1% preservative-free intracameral lidocaine.    COMPLICATIONS:  None.   DESCRIPTION OF PROCEDURE:  The patient was identified in the holding room and transported to the operating room and placed in the supine position under the operating microscope.  The left eye was identified as the operative eye and it was prepped and draped in the usual sterile ophthalmic fashion.   A 1 millimeter clear-corneal paracentesis was made at the 1:30 position.  0.5 ml of preservative-free 1% lidocaine was injected into the anterior chamber.  The anterior chamber was filled with Viscoat viscoelastic.  A 2.4 millimeter keratome was used to make a near-clear corneal incision at the 10:30 position.  .  A curvilinear capsulorrhexis was made with a cystotome and capsulorrhexis forceps.  Balanced salt solution was used to hydrodissect and hydrodelineate the nucleus.   Phacoemulsification was then used in stop and chop fashion to remove the lens nucleus and epinucleus.  The remaining cortex was then removed using the irrigation and aspiration handpiece. Provisc was then placed into the capsular bag to distend it for lens placement.  A lens was then injected into the capsular bag.   The remaining viscoelastic was aspirated.   Wounds were hydrated with balanced salt solution.  The anterior chamber was inflated to a physiologic pressure with balanced salt solution.  No wound leaks were noted. Vigamox 0.2 ml of a 1mg  per ml solution was injected into the anterior chamber for a dose of 0.2 mg of intracameral antibiotic at the completion of the case.   Timolol and Brimonidine drops were applied to the eye.  The patient was taken to the recovery room in stable condition without complications of anesthesia or surgery.  [name] 01/03/2021, 11:17 AM

## 2021-01-03 NOTE — Anesthesia Postprocedure Evaluation (Signed)
Anesthesia Post Note  Patient: Tanya Frye  Procedure(s) Performed: CATARACT EXTRACTION PHACO AND INTRAOCULAR LENS PLACEMENT (IOC) LEFT DIABETIC  7.37 01:15.6 9.8% (Left Eye)     Patient location during evaluation: PACU Anesthesia Type: MAC Level of consciousness: awake and alert Pain management: pain level controlled Vital Signs Assessment: post-procedure vital signs reviewed and stable Respiratory status: spontaneous breathing Cardiovascular status: blood pressure returned to baseline Postop Assessment: no apparent nausea or vomiting, adequate PO intake and no headache Anesthetic complications: no   No complications documented.  Tanya Frye

## 2021-01-04 ENCOUNTER — Encounter: Payer: Self-pay | Admitting: Ophthalmology

## 2021-04-02 ENCOUNTER — Ambulatory Visit: Payer: Medicare Other | Admitting: Internal Medicine

## 2021-04-03 ENCOUNTER — Other Ambulatory Visit: Payer: Self-pay

## 2021-04-03 ENCOUNTER — Ambulatory Visit (INDEPENDENT_AMBULATORY_CARE_PROVIDER_SITE_OTHER): Payer: Medicare Other | Admitting: Internal Medicine

## 2021-04-03 ENCOUNTER — Encounter: Payer: Self-pay | Admitting: Internal Medicine

## 2021-04-03 VITALS — BP 132/84 | HR 80 | Ht 63.5 in | Wt 201.8 lb

## 2021-04-03 DIAGNOSIS — E0840 Diabetes mellitus due to underlying condition with diabetic neuropathy, unspecified: Secondary | ICD-10-CM

## 2021-04-03 DIAGNOSIS — M4005 Postural kyphosis, thoracolumbar region: Secondary | ICD-10-CM | POA: Insufficient documentation

## 2021-04-03 DIAGNOSIS — Z13820 Encounter for screening for osteoporosis: Secondary | ICD-10-CM

## 2021-04-03 DIAGNOSIS — H9193 Unspecified hearing loss, bilateral: Secondary | ICD-10-CM

## 2021-04-03 DIAGNOSIS — E1169 Type 2 diabetes mellitus with other specified complication: Secondary | ICD-10-CM

## 2021-04-03 DIAGNOSIS — I10 Essential (primary) hypertension: Secondary | ICD-10-CM

## 2021-04-03 LAB — POCT GLYCOSYLATED HEMOGLOBIN (HGB A1C): HbA1c POC (<> result, manual entry): 7.1 % (ref 4.0–5.6)

## 2021-04-03 NOTE — Progress Notes (Signed)
General check  Established Patient Office Visit  Subjective:  Patient ID: Tanya Frye, female    DOB: 10/24/1940  Age: 80 y.o. MRN: [id number]  CC: No chief complaint on file.   HPI  [name] presents for general check.  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   Diabetes mellitus without complication ([place])    HOH (hard of hearing)    extremely HOH   Hypertension    Palpitations    occasional related to meds/heart races    Past Surgical History:  Procedure Laterality Date   ABDOMINAL HYSTERECTOMY  1990   CATARACT EXTRACTION W/PHACO Right 12/20/2020   Procedure: CATARACT EXTRACTION PHACO AND INTRAOCULAR LENS PLACEMENT (IOC) RIGHT DIABETIC 8.13 01:17.6 10.5%;  Surgeon: Leandrew Koyanagi, MD;  Location: [facility];  Service: Ophthalmology;  Laterality: Right;   CATARACT EXTRACTION W/PHACO Left 01/03/2021   Procedure: CATARACT EXTRACTION PHACO AND INTRAOCULAR LENS PLACEMENT (IOC) LEFT DIABETIC  7.37 01:15.6 9.8%;  Surgeon: Leandrew Koyanagi, MD;  Location: [facility];  Service: Ophthalmology;  Laterality: Left;   CHOLECYSTECTOMY     DILATION AND CURETTAGE OF UTERUS  before [place] SURGERY  03/30/2008   Per family, pt broke her neck   STOMACH SURGERY     tumor removed, per family    History reviewed. No pertinent family history.  Social History   Socioeconomic History   Marital status: Married    Spouse name: Not on file   Number of children: Not on file   Years of education: Not on file   Highest education level: Not on file  Occupational History   Not on file  Tobacco Use   Smoking status: Never   Smokeless tobacco: Never  Vaping Use   Vaping Use: Not on file  Substance and Sexual Activity   Alcohol use: Never    Drug use: Never   Sexual activity: Not Currently  Other Topics Concern   Not on file  Social History Narrative   Not on file   Social Determinants of Health   Financial Resource Strain: Not on file  Food Insecurity: Not on file  Transportation Needs: Not on file  Physical Activity: Not on file  Stress: Not on file  Social Connections: Not on file  Intimate Partner Violence: Not on file     Current Outpatient Medications:    aspirin EC 81 MG tablet, Take 1 tablet by mouth daily., Disp: , Rfl:    Calcium Carbonate-Vit D-Min (CALCIUM 1200 PO), Take by mouth., Disp: , Rfl:    furosemide (LASIX) 40 MG tablet, Take 1 tablet (40 mg total) by mouth daily., Disp: 90 tablet, Rfl: 3   glipiZIDE (GLUCOTROL XL) 5 MG 24 hr tablet, TAKE 3 TABLETS BY MOUTH DAILY, Disp: 270 tablet, Rfl: 1   levothyroxine (SYNTHROID, LEVOTHROID) 88 MCG tablet, Take 88 mcg by mouth every morning., Disp: , Rfl: 0   loratadine (CLARITIN) 10 MG tablet, TAKE 1 TABLET BY MOUTH EVERY DAY, Disp: 90 tablet, Rfl: 3   losartan (COZAAR) 50 MG tablet, Take 50 mg by mouth daily., Disp: , Rfl: 3   lovastatin (MEVACOR) 10 MG tablet, TAKE 1 TABLET ONCE A DAY ORALLY, Disp: , Rfl: 0   metFORMIN (  GLUCOPHAGE) 1000 MG tablet, TAKE 1 TABLET BY MOUTH TWICE A DAY, Disp: 180 tablet, Rfl: 1   metoprolol succinate (TOPROL-XL) 50 MG 24 hr tablet, Take 1 tablet (50 mg total) by mouth daily. Take with or immediately following a meal., Disp: 90 tablet, Rfl: 3   Omega-3 Fatty Acids (FISH OIL) 500 MG CAPS, Take 1,000 mg by mouth daily., Disp: , Rfl:    omeprazole (PRILOSEC) 20 MG capsule, Take 1 capsule (20 mg total) by mouth daily., Disp: 90 capsule, Rfl: 3   Allergies  Allergen Reactions   Penicillins Rash    ROS Review of Systems  Constitutional: Negative.   HENT: Negative.    Eyes: Negative.   Respiratory: Negative.    Cardiovascular: Negative.   Gastrointestinal: Negative.   Endocrine: Negative.   Genitourinary: Negative.    Musculoskeletal: Negative.   Skin: Negative.   Allergic/Immunologic: Negative.   Neurological: Negative.   Hematological: Negative.   Psychiatric/Behavioral: Negative.    All other systems reviewed and are negative.    Objective:    Physical Exam Vitals reviewed.  Constitutional:      Appearance: Normal appearance.  HENT:     Mouth/Throat:     Mouth: Mucous membranes are moist.  Eyes:     Pupils: Pupils are equal, round, and reactive to light.  Neck:     Vascular: No carotid bruit.  Cardiovascular:     Rate and Rhythm: Normal rate and regular rhythm.     Pulses: Normal pulses.     Heart sounds: Normal heart sounds.  Pulmonary:     Effort: Pulmonary effort is normal.     Breath sounds: Normal breath sounds.  Abdominal:     General: Bowel sounds are normal.     Palpations: Abdomen is soft. There is no hepatomegaly, splenomegaly or mass.     Tenderness: There is no abdominal tenderness.     Hernia: No hernia is present.  Musculoskeletal:        General: No tenderness.     Cervical back: Neck supple.     Right lower leg: No edema.     Left lower leg: No edema.  Skin:    Findings: No rash.  Neurological:     Mental Status: She is alert and oriented to person, place, and time.     Motor: No weakness.  Psychiatric:        Mood and Affect: Mood and affect normal.        Behavior: Behavior normal.    BP 132/84   Pulse 80   Ht 5' 3.5" (1.613 m)   Wt 201 lb 12.8 oz (91.5 kg)   BMI 35.19 kg/m  Wt Readings from Last 3 Encounters:  04/03/21 201 lb 12.8 oz (91.5 kg)  01/03/21 196 lb (88.9 kg)  12/14/20 200 lb 12.8 oz (91.1 kg)     Health Maintenance Due  Topic Date Due   COVID-19 Vaccine (1) Never done   Hepatitis C Screening  Never done   Zoster Vaccines- Shingrix (1 of 2) Never done   DEXA SCAN  Never done   PNA vac Low Risk Adult (1 of 2 - PCV13) Never done    There are no preventive care reminders to display for this patient.  No results found for:  TSH Lab Results  Component Value Date   WBC 10.3 01/03/2018   HGB 11.8 (L) 01/03/2018   HCT 36.1 01/03/2018   MCV 78.4 (L) 01/03/2018   PLT 286 01/03/2018  Lab Results  Component Value Date   NA 137 01/03/2018   K 3.5 01/03/2018   CO2 27 01/03/2018   GLUCOSE 169 (H) 01/03/2018   BUN 10 01/03/2018   CREATININE 0.78 01/03/2018   BILITOT 0.6 01/23/2015   ALKPHOS 52 01/23/2015   AST 15 01/23/2015   ALT 11 (L) 01/23/2015   PROT 7.0 01/23/2015   ALBUMIN 3.1 (L) 01/23/2015   CALCIUM 8.8 (L) 01/03/2018   ANIONGAP 8 01/03/2018   No results found for: CHOL No results found for: HDL No results found for: LDLCALC No results found for: TRIG No results found for: CHOLHDL Lab Results  Component Value Date   HGBA1C 7.1 04/03/2021      Assessment & Plan:   Problem List Items Addressed This Visit       Cardiovascular and Mediastinum   Essential hypertension     Patient denies any chest pain or shortness of breath there is no history of palpitation or paroxysmal nocturnal dyspnea   patient was advised to follow low-salt low-cholesterol diet    ideally I want to keep systolic blood pressure below 130 mmHg, patient was asked to check blood pressure one times a week and give me a report on that.  Patient will be follow-up in 3 months  or earlier as needed, patient will call me back for any change in the cardiovascular symptoms Patient was advised to buy a book from local bookstore concerning blood pressure and read several chapters  every day.  This will be supplemented by some of the material we will give him from the office.  Patient should also utilize other resources like YouTube and Internet to learn more about the blood pressure and the diet.         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 - The patient will continue the current treatment regimen.  - I encouraged the patient to regularly  check blood sugar.  - I encouraged the patient to monitor diet. I encouraged the patient to eat low-carb and low-sugar to help prevent blood sugar spikes.  - I encouraged the patient to continue following their prescribed treatment plan for diabetes - I informed the patient to get help if blood sugar drops below 54mg /dL, or if suddenly have trouble thinking clearly or breathing.  Patient was advised to buy a book on diabetes from a local bookstore or from [place]  Patient should read 2 chapters every day to keep the motivation going, this is in addition to some of the materials we provided them from the office.  There are other resources on the Internet like YouTube and [employer] to get an education on the diabetes         Nervous and Auditory   Bilateral deafness    Refer to ENT for hearing aid         Musculoskeletal and Integument   Postural kyphosis of thoracolumbar region    I suggested the bone density test to the patient         Other   Morbid obesity ([place])    - I encouraged the patient to lose weight.  - I educated them on making healthy dietary choices including eating more fruits and vegetables and less fried foods. - I encouraged the patient to exercise more, and educated on the benefits of exercise including weight loss, diabetes prevention, and hypertension prevention.   Dietary  counseling with a registered dietician  Referral to a weight management support group (e.g. Weight Watchers, Overeaters Anonymous)  If your BMI is greater than 29 or you have gained more than 15 pounds you should work on weight loss.  Attend a healthy cooking class        Other Visit Diagnoses     Osteoporosis screening    -  Primary   Type 2 diabetes mellitus with other specified complication, unspecified whether long term insulin use ([place])       Relevant Orders   POCT HgB A1C (Completed)       No orders of the defined types were placed in this encounter.   Follow-up: No  follow-ups on file.    Cletis Athens, MD

## 2021-04-03 NOTE — Assessment & Plan Note (Signed)

## 2021-04-03 NOTE — Assessment & Plan Note (Signed)
Refer to ENT for hearing aid

## 2021-04-03 NOTE — Assessment & Plan Note (Signed)

## 2021-04-03 NOTE — Assessment & Plan Note (Signed)
I suggested the bone density test to the patient

## 2021-04-03 NOTE — Assessment & Plan Note (Signed)

## 2021-05-28 ENCOUNTER — Other Ambulatory Visit: Payer: Self-pay | Admitting: Internal Medicine

## 2021-06-04 ENCOUNTER — Other Ambulatory Visit: Payer: Self-pay

## 2021-06-04 MED ORDER — OMEPRAZOLE 20 MG PO CPDR: DELAYED_RELEASE_CAPSULE | ORAL | 2 refills | Status: DC

## 2021-07-03 ENCOUNTER — Ambulatory Visit (INDEPENDENT_AMBULATORY_CARE_PROVIDER_SITE_OTHER): Payer: Medicare Other | Admitting: Internal Medicine

## 2021-07-03 ENCOUNTER — Encounter: Payer: Self-pay | Admitting: Internal Medicine

## 2021-07-03 VITALS — BP 126/64 | HR 71 | Ht 63.5 in | Wt 202.0 lb

## 2021-07-03 DIAGNOSIS — E1169 Type 2 diabetes mellitus with other specified complication: Secondary | ICD-10-CM | POA: Diagnosis not present

## 2021-07-03 DIAGNOSIS — J301 Allergic rhinitis due to pollen: Secondary | ICD-10-CM | POA: Diagnosis not present

## 2021-07-03 DIAGNOSIS — H9193 Unspecified hearing loss, bilateral: Secondary | ICD-10-CM

## 2021-07-03 DIAGNOSIS — E0840 Diabetes mellitus due to underlying condition with diabetic neuropathy, unspecified: Secondary | ICD-10-CM

## 2021-07-03 DIAGNOSIS — I10 Essential (primary) hypertension: Secondary | ICD-10-CM | POA: Diagnosis not present

## 2021-07-03 DIAGNOSIS — Z23 Encounter for immunization: Secondary | ICD-10-CM | POA: Diagnosis not present

## 2021-07-03 LAB — GLUCOSE, POCT (MANUAL RESULT ENTRY): POC Glucose: 151 mg/dl — AB (ref 70–99)

## 2021-07-03 MED ORDER — LOSARTAN POTASSIUM 50 MG PO TABS: 50.0000 mg | ORAL_TABLET | Freq: Every day | ORAL | 3 refills | Status: DC

## 2021-07-03 NOTE — Assessment & Plan Note (Signed)

## 2021-07-03 NOTE — Progress Notes (Signed)
Established Patient Office Visit  Subjective:  Patient ID: Tanya Frye, female    DOB: March 08, 1941  Age: 80 y.o. 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   Diabetes mellitus without complication ([place])    HOH (hard of hearing)    extremely HOH   Hypertension    Palpitations    occasional related to meds/heart races    Past Surgical History:  Procedure Laterality Date   ABDOMINAL HYSTERECTOMY  1990   CATARACT EXTRACTION W/PHACO Right 12/20/2020   Procedure: CATARACT EXTRACTION PHACO AND INTRAOCULAR LENS PLACEMENT (IOC) RIGHT DIABETIC 8.13 01:17.6 10.5%;  Surgeon: Leandrew Koyanagi, MD;  Location: [place];  Service: Ophthalmology;  Laterality: Right;   CATARACT EXTRACTION W/PHACO Left 01/03/2021   Procedure: CATARACT EXTRACTION PHACO AND INTRAOCULAR LENS PLACEMENT (IOC) LEFT DIABETIC  7.37 01:15.6 9.8%;  Surgeon: Leandrew Koyanagi, MD;  Location: [facility];  Service: Ophthalmology;  Laterality: Left;   CHOLECYSTECTOMY     DILATION AND CURETTAGE OF UTERUS  before [place] [facility]ERY  03/30/2008   Per family, pt broke her neck   STOMACH SURGERY     tumor removed, per family    No family history on file.  Social History   Socioeconomic History   Marital status: Married    Spouse name: Not on file   Number of children: Not on file   Years of education: Not on file   Highest education level: Not on file  Occupational History   Not on file  Tobacco Use   Smoking status: Never   Smokeless tobacco: Never  Vaping Use   Vaping Use: Not on file  Substance and Sexual Activity   Alcohol use: Never   Drug use: Never   Sexual activity: Not Currently  Other Topics Concern   Not on file  Social History Narrative   Not on file   Social Determinants  of Health   Financial Resource Strain: Not on file  Food Insecurity: Not on file  Transportation Needs: Not on file  Physical Activity: Not on file  Stress: Not on file  Social Connections: Not on file  Intimate Partner Violence: Not on file     Current Outpatient Medications:    aspirin EC 81 MG tablet, Take 1 tablet by mouth daily., Disp: , Rfl:    Calcium Carbonate-Vit D-Min (CALCIUM 1200 PO), Take by mouth., Disp: , Rfl:    furosemide (LASIX) 40 MG tablet, Take 1 tablet (40 mg total) by mouth daily., Disp: 90 tablet, Rfl: 3   glipiZIDE (GLUCOTROL XL) 5 MG 24 hr tablet, TAKE 3 TABLETS BY MOUTH DAILY, Disp: 270 tablet, Rfl: 1   levothyroxine (SYNTHROID, LEVOTHROID) 88 MCG tablet, Take 88 mcg by mouth every morning., Disp: , Rfl: 0   loratadine (CLARITIN) 10 MG tablet, TAKE 1 TABLET BY MOUTH EVERY DAY, Disp: 90 tablet, Rfl: 3   losartan (COZAAR) 50 MG tablet, Take 1 tablet (50 mg total) by mouth daily., Disp: 90 tablet, Rfl: 3   lovastatin (MEVACOR) 10 MG tablet, TAKE 1 TABLET ONCE A DAY ORALLY, Disp: , Rfl: 0   metFORMIN (GLUCOPHAGE) 1000 MG tablet, TAKE 1 TABLET BY MOUTH TWICE A DAY, Disp: 180 tablet, Rfl:  1   metoprolol succinate (TOPROL-XL) 50 MG 24 hr tablet, Take 1 tablet (50 mg total) by mouth daily. Take with or immediately following a meal., Disp: 90 tablet, Rfl: 3   Omega-3 Fatty Acids (FISH OIL) 500 MG CAPS, Take 1,000 mg by mouth daily., Disp: , Rfl:    omeprazole (PRILOSEC) 20 MG capsule, TAKE 1 CAPSULE BY MOUTH EVERY DAY, Disp: 90 capsule, Rfl: 2   Allergies  Allergen Reactions   Penicillins Rash    ROS Review of Systems  Constitutional: Negative.   HENT: Negative.    Eyes: Negative.   Respiratory: Negative.    Cardiovascular: Negative.   Gastrointestinal: Negative.   Endocrine: Negative.   Genitourinary: Negative.   Musculoskeletal: Negative.   Skin: Negative.   Allergic/Immunologic: Negative.   Neurological: Negative.   Hematological: Negative.    Psychiatric/Behavioral: Negative.    All other systems reviewed and are negative.    Objective:    Physical Exam Vitals reviewed.  Constitutional:      Appearance: Normal appearance.  HENT:     Mouth/Throat:     Mouth: Mucous membranes are moist.  Eyes:     Pupils: Pupils are equal, round, and reactive to light.  Neck:     Vascular: No carotid bruit.  Cardiovascular:     Rate and Rhythm: Normal rate and regular rhythm.     Pulses: Normal pulses.     Heart sounds: Normal heart sounds.  Pulmonary:     Effort: Pulmonary effort is normal.     Breath sounds: Normal breath sounds.  Abdominal:     General: Bowel sounds are normal.     Palpations: Abdomen is soft. There is no hepatomegaly, splenomegaly or mass.     Tenderness: There is no abdominal tenderness.     Hernia: No hernia is present.  Musculoskeletal:        General: No tenderness.     Cervical back: Neck supple.     Right lower leg: No edema.     Left lower leg: No edema.  Skin:    Findings: No rash.  Neurological:     Mental Status: She is alert and oriented to person, place, and time.     Motor: No weakness.  Psychiatric:        Mood and Affect: Mood and affect normal.        Behavior: Behavior normal.    BP 126/64   Pulse 71   Ht 5' 3.5" (1.613 m)   Wt 202 lb (91.6 kg)   BMI 35.22 kg/m  Wt Readings from Last 3 Encounters:  07/03/21 202 lb (91.6 kg)  04/03/21 201 lb 12.8 oz (91.5 kg)  01/03/21 196 lb (88.9 kg)     Health Maintenance Due  Topic Date Due   FOOT EXAM  Never done   Hepatitis C Screening  Never done   Zoster Vaccines- Shingrix (1 of 2) Never done   DEXA SCAN  Never done    There are no preventive care reminders to display for this patient.  No results found for: TSH Lab Results  Component Value Date   WBC 10.3 01/03/2018   HGB 11.8 (L) 01/03/2018   HCT 36.1 01/03/2018   MCV 78.4 (L) 01/03/2018   PLT 286 01/03/2018   Lab Results  Component Value Date   NA 137 01/03/2018    K 3.5 01/03/2018   CO2 27 01/03/2018   GLUCOSE 169 (H) 01/03/2018   BUN 10 01/03/2018   CREATININE 0.78  01/03/2018   BILITOT 0.6 01/23/2015   ALKPHOS 52 01/23/2015   AST 15 01/23/2015   ALT 11 (L) 01/23/2015   PROT 7.0 01/23/2015   ALBUMIN 3.1 (L) 01/23/2015   CALCIUM 8.8 (L) 01/03/2018   ANIONGAP 8 01/03/2018   No results found for: CHOL No results found for: HDL No results found for: LDLCALC No results found for: TRIG No results found for: CHOLHDL Lab Results  Component Value Date   HGBA1C 7.1 04/03/2021      Assessment & Plan:   Problem List Items Addressed This Visit       Cardiovascular and Mediastinum   Essential hypertension     Patient denies any chest pain or shortness of breath there is no history of palpitation or paroxysmal nocturnal dyspnea   patient was advised to follow low-salt low-cholesterol diet    ideally I want to keep systolic blood pressure below 130 mmHg, patient was asked to check blood pressure one times a week and give me a report on that.  Patient will be follow-up in 3 months  or earlier as needed, patient will call me back for any change in the cardiovascular symptoms Patient was advised to buy a book from local bookstore concerning blood pressure and read several chapters  every day.  This will be supplemented by some of the material we will give him from the office.  Patient should also utilize other resources like YouTube and Internet to learn more about the blood pressure and the diet.      Relevant Medications   losartan (COZAAR) 50 MG tablet     Respiratory   Seasonal allergic rhinitis due to pollen    Take Claritin 10 mg p.o. 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 - The patient will continue the current treatment regimen.  - I encouraged the patient to regularly check blood sugar.  - I encouraged the patient to  monitor diet. I encouraged the patient to eat low-carb and low-sugar to help prevent blood sugar spikes.  - I encouraged the patient to continue following their prescribed treatment plan for diabetes - I informed the patient to get help if blood sugar drops below 54mg /dL, or if suddenly have trouble thinking clearly or breathing.  Patient was advised to buy a book on diabetes from a local bookstore or from [place]  Patient should read 2 chapters every day to keep the motivation going, this is in addition to some of the materials we provided them from the office.  There are other resources on the Internet like YouTube and [employer]kip[employer] to get an education on the diabetes      Relevant Medications   losartan (COZAAR) 50 MG tablet     Nervous and Auditory   Bilateral deafness    Refer to ENT        Other   Morbid obesity ([place])    - I encouraged the patient to lose weight.  - I educated them on making healthy dietary choices including eating more fruits and vegetables and less fried foods. - I encouraged the patient to exercise more, and educated on the benefits of exercise including weight loss, diabetes prevention, and hypertension prevention.   Dietary counseling with a registered dietician  Referral to a weight management support group (e.g. Weight Watchers, Overeaters Anonymous)  If your BMI is  greater than 29 or you have gained more than 15 pounds you should work on weight loss.  Attend a healthy cooking class       Need for influenza vaccination    Patient was vaccinated      Relevant Orders   Flu Vaccine QUAD High Dose(Fluad) (Completed)   Other Visit Diagnoses     Need for pneumococcal vaccination    -  Primary   Relevant Orders   Pneumococcal conjugate vaccine 20-valent (Prevnar 20) (Completed)   Type 2 diabetes mellitus with other specified complication, unspecified whether long term insulin use (H[facility])       Relevant Medications   losartan (COZAAR) 50 MG tablet   Other  Relevant Orders   POCT glucose (manual entry) (Completed)       Meds ordered this encounter  Medications   losartan (COZAAR) 50 MG tablet    Sig: Take 1 tablet (50 mg total) by mouth daily.    Dispense:  90 tablet    Refill:  3  Calories are units of energy. Your body needs a certain number of calories from food to keep going throughout the day. When you eat or drink more calories than your body needs, your body stores the extra calories mostly as fat. When you eat or drink fewer calories than your body needs, your body burns fat to get the energy it needs. Calorie counting means keeping track of how many calories you eat and drink each day. Calorie counting can be helpful if you need to lose weight. If you eat fewer calories than your body needs, you should lose weight. Ask your health care provider what a healthy weight is for you. For calorie counting to work, you will need to eat the right number of calories each day to lose a healthy amount of weight per week. A dietitian can help you figure out how many calories you need in a day and will suggest ways to reach your calorie goal. A healthy amount of weight to lose each week is usually 1-2 lb (0.5-0.9 kg). This usually means that your daily calorie intake should be reduced by 500-750 calories. Eating 1,200-1,500 calories a day can help most women lose weight. Eating 1,500-1,800 calories a day can help most men lose weight. What do I need to know about calorie counting? Work with your health care provider or dietitian to determine how many calories you should get each day. To meet your daily calorie goal, you will need to: Find out how many calories are in each food that you would like to eat. Try to do this before you eat. Decide how much of the food you plan to eat. Keep a food log. Do this by writing down what you ate and how many calories it had. To successfully lose weight, it is important to balance calorie counting with a healthy  lifestyle that includes regular activity. Where do I find calorie information? The number of calories in a food can be found on a Nutrition Facts label. If a food does not have a Nutrition Facts label, try to look up the calories online or ask your dietitian for help. Remember that calories are listed per serving. If you choose to have more than one serving of a food, you will have to multiply the calories per serving by the number of servings you plan to eat. For example, the label on a package of bread might say that a serving size is 1 slice and that there  are 90 calories in a serving. If you eat 1 slice, you will have eaten 90 calories. If you eat 2 slices, you will have eaten 180 calories. How do I keep a food log? After each time that you eat, record the following in your food log as soon as possible: What you ate. Be sure to include toppings, sauces, and other extras on the food. How much you ate. This can be measured in cups, ounces, or number of items. How many calories were in each food and drink. The total number of calories in the food you ate. Keep your food log near you, such as in a pocket-sized notebook or on an app or website on your mobile phone. Some programs will calculate calories for you and show you how many calories you have left to meet your daily goal. What are some portion-control tips? Know how many calories are in a serving. This will help you know how many servings you can have of a certain food. Use a measuring cup to measure serving sizes. You could also try weighing out portions on a kitchen scale. With time, you will be able to estimate serving sizes for some foods. Take time to put servings of different foods on your favorite plates or in your favorite bowls and cups so you know what a serving looks like. Try not to eat straight from a food's packaging, such as from a bag or box. Eating straight from the package makes it hard to see how much you are eating and can  lead to overeating. Put the amount you would like to eat in a cup or on a plate to make sure you are eating the right portion. Use smaller plates, glasses, and bowls for smaller portions and to prevent overeating. Try not to multitask. For example, avoid watching TV or using your computer while eating. If it is time to eat, sit down at a table and enjoy your food. This will help you recognize when you are full. It will also help you be more mindful of what and how much you are eating. What are tips for following this plan? Reading food labels Check the calorie count compared with the serving size. The serving size may be smaller than what you are used to eating. Check the source of the calories. Try to choose foods that are high in protein, fiber, and vitamins, and low in saturated fat, trans fat, and sodium. Shopping Read nutrition labels while you shop. This will help you make healthy decisions about which foods to buy. Pay attention to nutrition labels for low-fat or fat-free foods. These foods sometimes have the same number of calories or more calories than the full-fat versions. They also often have added sugar, starch, or salt to make up for flavor that was removed with the fat. Make a grocery list of lower-calorie foods and stick to it. Cooking Try to cook your favorite foods in a healthier way. For example, try baking instead of frying. Use low-fat dairy products. Meal planning Use more fruits and vegetables. One-half of your plate should be fruits and vegetables. Include lean proteins, such as chicken, [place], and fish. Lifestyle Each week, aim to do one of the following: 150 minutes of moderate exercise, such as walking. 75 minutes of vigorous exercise, such as running. General information Know how many calories are in the foods you eat most often. This will help you calculate calorie counts faster. Find a way of tracking calories that works for you.  Get creative. Try different apps  or programs if writing down calories does not work for you. Follow-up: No follow-ups on file.    Cletis Athens, MD

## 2021-07-03 NOTE — Assessment & Plan Note (Signed)
Take Claritin 10 mg p.o. daily as needed 

## 2021-07-03 NOTE — Assessment & Plan Note (Signed)
Refer to ENT

## 2021-07-03 NOTE — Assessment & Plan Note (Signed)
Patient was vaccinated

## 2021-07-03 NOTE — Assessment & Plan Note (Signed)

## 2021-07-03 NOTE — Assessment & Plan Note (Signed)

## 2021-07-05 ENCOUNTER — Encounter: Payer: Self-pay | Admitting: Internal Medicine

## 2021-08-07 LAB — HM DIABETES EYE EXAM

## 2021-08-26 ENCOUNTER — Other Ambulatory Visit: Payer: Self-pay | Admitting: Internal Medicine

## 2021-08-27 ENCOUNTER — Other Ambulatory Visit: Payer: Self-pay

## 2021-08-28 ENCOUNTER — Other Ambulatory Visit: Payer: Self-pay | Admitting: *Deleted

## 2021-08-28 DIAGNOSIS — R000 Tachycardia, unspecified: Secondary | ICD-10-CM

## 2021-08-28 MED ORDER — METOPROLOL SUCCINATE ER 50 MG PO TB24
50.0000 mg | ORAL_TABLET | Freq: Every day | ORAL | 3 refills | Status: DC
Start: 2021-08-28 — End: 2022-08-23

## 2021-09-05 ENCOUNTER — Ambulatory Visit (INDEPENDENT_AMBULATORY_CARE_PROVIDER_SITE_OTHER): Payer: Medicare Other

## 2021-09-05 DIAGNOSIS — Z0000 Encounter for general adult medical examination without abnormal findings: Secondary | ICD-10-CM

## 2021-09-05 NOTE — Progress Notes (Signed)
I have reviewed this visit and agree with the documentation.   

## 2021-09-05 NOTE — Progress Notes (Signed)
Subjective:   Tanya Frye is a 80 y.o. female who presents for Medicare Annual (Subsequent) preventive examination. I discussed the limitations of evaluation and management by telemedicine and the availability of in person appointments. The patient expressed understanding and agreed to proceed.   Visit performed by audio   Patient location: Home  Provider location: Home  Review of Systems    N/A       Objective:    There were no vitals filed for this visit. There is no height or weight on file to calculate BMI.  Advanced Directives 09/05/2021 01/03/2021 12/20/2020 01/03/2018  Does Patient Have a Medical Advance Directive? No No No No  Would patient like information on creating a medical advance directive? - No - Patient declined No - Patient declined No - Patient declined    Current Medications (verified) Outpatient Encounter Medications as of 09/05/2021  Medication Sig   aspirin EC 81 MG tablet Take 1 tablet by mouth daily.   Calcium Carbonate-Vit D-Min (CALCIUM 1200 PO) Take by mouth.   furosemide (LASIX) 40 MG tablet TAKE 1 TABLET BY MOUTH EVERY DAY   glipiZIDE (GLUCOTROL XL) 5 MG 24 hr tablet TAKE 3 TABLETS BY MOUTH DAILY   levothyroxine (SYNTHROID, LEVOTHROID) 88 MCG tablet Take 88 mcg by mouth every morning.   loratadine (CLARITIN) 10 MG tablet TAKE 1 TABLET BY MOUTH EVERY DAY   losartan (COZAAR) 50 MG tablet Take 1 tablet (50 mg total) by mouth daily.   lovastatin (MEVACOR) 10 MG tablet TAKE 1 TABLET ONCE A DAY ORALLY   metFORMIN (GLUCOPHAGE) 1000 MG tablet TAKE 1 TABLET BY MOUTH TWICE A DAY   metoprolol succinate (TOPROL-XL) 50 MG 24 hr tablet Take 1 tablet (50 mg total) by mouth daily. Take with or immediately following a meal.   Omega-3 Fatty Acids (FISH OIL) 500 MG CAPS Take 1,000 mg by mouth daily.   omeprazole (PRILOSEC) 20 MG capsule TAKE 1 CAPSULE BY MOUTH EVERY DAY   No facility-administered encounter medications on file as of 09/05/2021.    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   Diabetes mellitus without complication ([place])    HOH (hard of hearing)    extremely HOH   Hypertension    Palpitations    occasional related to meds/heart races   Past Surgical History:  Procedure Laterality Date   ABDOMINAL HYSTERECTOMY  1990   CATARACT EXTRACTION W/PHACO Right 12/20/2020   Procedure: CATARACT EXTRACTION PHACO AND INTRAOCULAR LENS PLACEMENT (IOC) RIGHT DIABETIC 8.13 01:17.6 10.5%;  Surgeon: Leandrew Koyanagi, MD;  Location: [place];  Service: Ophthalmology;  Laterality: Right;   CATARACT EXTRACTION W/PHACO Left 01/03/2021   Procedure: CATARACT EXTRACTION PHACO AND INTRAOCULAR LENS PLACEMENT (IOC) LEFT DIABETIC  7.37 01:15.6 9.8%;  Surgeon: Leandrew Koyanagi, MD;  Location: [place];  Service: Ophthalmology;  Laterality: Left;   CHOLECYSTECTOMY     DILATION AND CURETTAGE OF UTERUS  before [place] [facility]  03/30/2008   Per family, pt broke her neck   STOMACH SURGERY     tumor removed, per family   No family history on file. Social History   Socioeconomic History   Marital status: Widowed    Spouse name: Not on file   Number of children: 1   Years of education: Not on file   Highest education level: 10th grade  Occupational History  Not on file  Tobacco Use   Smoking status: Never   Smokeless tobacco: Never  Vaping Use   Vaping Use: Not on file  Substance and Sexual Activity   Alcohol use: Never   Drug use: Never   Sexual activity: Not Currently  Other Topics Concern   Not on file  Social History Narrative   Not on file   Social Determinants of Health   Financial Resource Strain: Low Risk    Difficulty of Paying Living Expenses: Not hard at all  Food Insecurity: No Food Insecurity   Worried About Charity fundraiser in the Last Year: Never true   [place] in the Last Year: Never true   Transportation Needs: No Transportation Needs   Lack of Transportation (Medical): No   Lack of Transportation (Non-Medical): No  Physical Activity: Inactive   Days of Exercise per Week: 0 days   Minutes of Exercise per Session: 0 min  Stress: No Stress Concern Present   Feeling of Stress : Not at all  Social Connections: Socially Isolated   Frequency of Communication with Friends and Family: More than three times a week   Frequency of Social Gatherings with Friends and Family: More than three times a week   Attends Religious Services: Never   Marine scientist or Organizations: No   Attends Archivist Meetings: Never   Marital Status: Widowed    Tobacco Counseling Counseling given: Not Answered   Clinical Intake:     Pain : No/denies pain     Diabetes: Yes  How often do you need to have someone help you when you read instructions, pamphlets, or other written materials from your doctor or pharmacy?: 1 - Never What is the last grade level you completed in school?: 10th grade  Diabetic?Yes  Interpreter Needed?: No  Information entered by :: Anson [place] CMA   Activities of Daily Living In your present state of health, do you have any difficulty performing the following activities: 09/05/2021 01/03/2021  Hearing? Y N  Vision? N N  Difficulty concentrating or making decisions? N N  Walking or climbing stairs? Y N  Dressing or bathing? N N  Doing errands, shopping? N -  Preparing Food and eating ? N -  Using the Toilet? N -  In the past six months, have you accidently leaked urine? N -  Do you have problems with loss of bowel control? N -  Managing your Medications? N -  Managing your Finances? N -  Housekeeping or managing your Housekeeping? N -  Some recent data might be hidden    Patient Care Team: Cletis Athens, MD as PCP - General (Internal Medicine)  Indicate any recent Medical Services you may have received from other than [facility]  providers in the past year (date may be approximate).     Assessment:   This is a routine wellness examination for [facility].  Hearing/Vision screen No results found.  Dietary issues and exercise activities discussed: Current Exercise Habits: The patient does not participate in regular exercise at present   Goals Addressed   None    Depression Screen PHQ 2/9 Scores 09/05/2021 04/03/2021 04/03/2021  PHQ - 2 Score 0 0 0  PHQ- 9 Score - 0 -    Fall Risk Fall Risk  09/05/2021 04/03/2021  Falls in the past year? 0 0  Number falls in past yr: 0 0  Injury with Fall? 0 0  Risk for fall due to :  No Fall Risks No Fall Risks  Follow up Falls evaluation completed Falls evaluation completed    [place]:  Any stairs in or around the home? Yes  If so, are there any without handrails? No  Home free of loose throw rugs in walkways, pet beds, electrical cords, etc? Yes  Adequate lighting in your home to reduce risk of falls? Yes   ASSISTIVE DEVICES UTILIZED TO PREVENT FALLS:  Life alert? No  Use of a cane, walker or w/c? Yes  Grab bars in the bathroom? Yes  Shower chair or bench in shower? Yes  Elevated toilet seat or a handicapped toilet? Yes   TIMED UP AND GO:  Was the test performed? No .  Length of time to ambulate 10 feet: 0 sec.     Cognitive Function:        Immunizations Immunization History  Administered Date(s) Administered   Fluad Quad(high Dose 65+) 05/30/2020, 07/03/2021   PNEUMOCOCCAL CONJUGATE-20 07/03/2021   Tdap 01/03/2018    TDAP status: Up to date  Flu Vaccine status: Up to date  Pneumococcal vaccine status: Up to date  Covid-19 vaccine status: Declined, Education has been provided regarding the importance of this vaccine but patient still declined. Advised may receive this vaccine at local pharmacy or Health Dept.or vaccine clinic. Aware to provide a copy of the vaccination record if obtained from local pharmacy or  Health Dept. Verbalized acceptance and understanding.  Qualifies for Shingles Vaccine? Yes   Zostavax completed No   Shingrix Completed?: No.    Education has been provided regarding the importance of this vaccine. Patient has been advised to call insurance company to determine out of pocket expense if they have not yet received this vaccine. Advised may also receive vaccine at local pharmacy or Health Dept. Verbalized acceptance and understanding.  Screening Tests Health Maintenance  Topic Date Due   Hepatitis C Screening  Never done   Zoster Vaccines- Shingrix (1 of 2) Never done   DEXA SCAN  Never done   COVID-19 Vaccine (1) 09/21/2021 (Originally 08/19/1941)   HEMOGLOBIN A1C  10/04/2021   OPHTHALMOLOGY EXAM  01/03/2022   FOOT EXAM  06/23/2022   TETANUS/TDAP  01/04/2028   Pneumonia Vaccine 45+ Years old  Completed   INFLUENZA VACCINE  Completed   HPV VACCINES  Aged Out    Health Maintenance  Health Maintenance Due  Topic Date Due   Hepatitis C Screening  Never done   Zoster Vaccines- Shingrix (1 of 2) Never done   DEXA SCAN  Never done    Colorectal cancer screening: No longer required.   Mammogram status: No longer required due to age.    Lung Cancer Screening: (Low Dose CT Chest recommended if Age 78-80 years, 30 pack-year currently smoking OR have quit w/in 15years.) does not qualify.   Lung Cancer Screening Referral: No  Additional Screening:  Hepatitis C Screening: does not qualify; Completed No  Vision Screening: Recommended annual ophthalmology exams for early detection of glaucoma and other disorders of the eye. Is the patient up to date with their annual eye exam?  Yes  Who is the provider or what is the name of the office in which the patient attends annual eye exams? [facility] If pt is not established with a provider, would they like to be referred to a provider to establish care? No .   Dental Screening: Recommended annual dental exams for  proper oral hygiene  [employer]  Referral / Chronic Care Management: CRR required this visit?  No   CCM required this visit?  No      Plan:     I have personally reviewed and noted the following in the patients chart:   Medical and social history Use of alcohol, tobacco or illicit drugs  Current medications and supplements including opioid prescriptions.  Functional ability and status Nutritional status Physical activity Advanced directives List of other physicians Hospitalizations, surgeries, and ER visits in previous 12 months Vitals Screenings to include cognitive, depression, and falls Referrals and appointments  In addition, I have reviewed and discussed with patient certain preventive protocols, quality metrics, and best practice recommendations. A written personalized care plan for preventive services as well as general preventive health recommendations were provided to patient.   Tanya Frye , Thank you for taking time to come for your Medicare Wellness Visit. I appreciate your ongoing commitment to your health goals. Please review the following plan we discussed and let me know if I can assist you in the future.   These are the goals we discussed:  Goals   None     This is a list of the screening recommended for you and due dates:  Health Maintenance  Topic Date Due   Hepatitis C Screening: USPSTF Recommendation to screen - Ages 58-79 yo.  Never done   Zoster (Shingles) Vaccine (1 of 2) Never done   DEXA scan (bone density measurement)  Never done   COVID-19 Vaccine (1) 09/21/2021*   Hemoglobin A1C  10/04/2021   Eye exam for diabetics  01/03/2022   Complete foot exam   06/23/2022   Tetanus Vaccine  01/04/2028   Pneumonia Vaccine  Completed   Flu Shot  Completed   HPV Vaccine  Aged Out  *Topic was postponed. The date shown is not the original due date.      Renato Gails, [place]   09/05/2021   Nurse Notes: Life alert will be ordered. Patient is  having some difficulty with frequent diarrhea that she believes is due to her metformin. Tanya Frye has an appointment scheduled October 02, 2021 to discuss this concern with provider. No further orders were placed today. Patient's daughter is informed of her need for shingles vaccine and how to obtain the vaccine.

## 2021-09-06 ENCOUNTER — Ambulatory Visit: Payer: Medicare Other

## 2021-10-03 ENCOUNTER — Ambulatory Visit (INDEPENDENT_AMBULATORY_CARE_PROVIDER_SITE_OTHER): Payer: Medicare Other | Admitting: Internal Medicine

## 2021-10-03 ENCOUNTER — Other Ambulatory Visit: Payer: Self-pay

## 2021-10-03 ENCOUNTER — Encounter: Payer: Self-pay | Admitting: Internal Medicine

## 2021-10-03 VITALS — BP 139/76 | HR 74 | Ht 63.5 in | Wt 199.8 lb

## 2021-10-03 DIAGNOSIS — H9193 Unspecified hearing loss, bilateral: Secondary | ICD-10-CM

## 2021-10-03 DIAGNOSIS — I10 Essential (primary) hypertension: Secondary | ICD-10-CM

## 2021-10-03 DIAGNOSIS — E119 Type 2 diabetes mellitus without complications: Secondary | ICD-10-CM

## 2021-10-03 DIAGNOSIS — J301 Allergic rhinitis due to pollen: Secondary | ICD-10-CM

## 2021-10-03 MED ORDER — LORATADINE 10 MG PO TABS
10.0000 mg | ORAL_TABLET | Freq: Every day | ORAL | 3 refills | Status: DC
Start: 2021-10-03 — End: 2022-09-24

## 2021-10-03 NOTE — Assessment & Plan Note (Signed)
Blood pressure is stable 

## 2021-10-03 NOTE — Progress Notes (Signed)
Established Patient Office Visit  Subjective:  Patient ID: Tanya Frye, female    DOB: 1940/12/12  Age: 81 y.o. 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   Diabetes mellitus without complication ([place])    HOH (hard of hearing)    extremely HOH   Hypertension    Palpitations    occasional related to meds/heart races    Past Surgical History:  Procedure Laterality Date   ABDOMINAL HYSTERECTOMY  1990   CATARACT EXTRACTION W/PHACO Right 12/20/2020   Procedure: CATARACT EXTRACTION PHACO AND INTRAOCULAR LENS PLACEMENT (IOC) RIGHT DIABETIC 8.13 01:17.6 10.5%;  Surgeon: Leandrew Koyanagi, MD;  Location: [place];  Service: Ophthalmology;  Laterality: Right;   CATARACT EXTRACTION W/PHACO Left 01/03/2021   Procedure: CATARACT EXTRACTION PHACO AND INTRAOCULAR LENS PLACEMENT (IOC) LEFT DIABETIC  7.37 01:15.6 9.8%;  Surgeon: Leandrew Koyanagi, MD;  Location: [place];  Service: Ophthalmology;  Laterality: Left;   CHOLECYSTECTOMY     DILATION AND CURETTAGE OF UTERUS  before [place] [facility]  03/30/2008   Per family, pt broke her neck   STOMACH SURGERY     tumor removed, per family    History reviewed. No pertinent family history.  Social History   Socioeconomic History   Marital status: Widowed    Spouse name: Not on file   Number of children: 1   Years of education: Not on file   Highest education level: 10th grade  Occupational History   Not on file  Tobacco Use   Smoking status: Never   Smokeless tobacco: Never  Vaping Use   Vaping Use: Not on file  Substance and Sexual Activity   Alcohol use: Never   Drug use: Never   Sexual activity: Not Currently  Other Topics Concern   Not on file  Social History Narrative   Not on file   Social Determinants of  Health   Financial Resource Strain: Low Risk    Difficulty of Paying Living Expenses: Not hard at all  Food Insecurity: No Food Insecurity   Worried About Charity fundraiser in the Last Year: Never true   [place] in the Last Year: Never true  Transportation Needs: No Transportation Needs   Lack of Transportation (Medical): No   Lack of Transportation (Non-Medical): No  Physical Activity: Inactive   Days of Exercise per Week: 0 days   Minutes of Exercise per Session: 0 min  Stress: No Stress Concern Present   Feeling of Stress : Not at all  Social Connections: Socially Isolated   Frequency of Communication with Friends and Family: More than three times a week   Frequency of Social Gatherings with Friends and Family: More than three times a week   Attends Religious Services: Never   Marine scientist or Organizations: No   Attends Archivist Meetings: Never   Marital Status: Widowed  Human resources officer Violence: Not At Risk   Fear of Current or Ex-Partner: No   Emotionally Abused: No   Physically Abused: No   Sexually Abused: No     Current Outpatient Medications:    aspirin EC 81 MG tablet, Take 1 tablet by mouth daily., Disp: , Rfl:    Calcium Carbonate-Vit D-Min (  CALCIUM 1200 PO), Take by mouth., Disp: , Rfl:    furosemide (LASIX) 40 MG tablet, TAKE 1 TABLET BY MOUTH EVERY DAY, Disp: 90 tablet, Rfl: 3   glipiZIDE (GLUCOTROL XL) 5 MG 24 hr tablet, TAKE 3 TABLETS BY MOUTH DAILY, Disp: 270 tablet, Rfl: 1   levothyroxine (SYNTHROID, LEVOTHROID) 88 MCG tablet, Take 88 mcg by mouth every morning., Disp: , Rfl: 0   loratadine (CLARITIN) 10 MG tablet, Take 1 tablet (10 mg total) by mouth daily., Disp: 90 tablet, Rfl: 3   losartan (COZAAR) 50 MG tablet, Take 1 tablet (50 mg total) by mouth daily., Disp: 90 tablet, Rfl: 3   lovastatin (MEVACOR) 10 MG tablet, TAKE 1 TABLET ONCE A DAY ORALLY, Disp: , Rfl: 0   metFORMIN (GLUCOPHAGE) 1000 MG tablet, TAKE 1 TABLET BY  MOUTH TWICE A DAY, Disp: 180 tablet, Rfl: 1   metoprolol succinate (TOPROL-XL) 50 MG 24 hr tablet, Take 1 tablet (50 mg total) by mouth daily. Take with or immediately following a meal., Disp: 90 tablet, Rfl: 3   Omega-3 Fatty Acids (FISH OIL) 500 MG CAPS, Take 1,000 mg by mouth daily., Disp: , Rfl:    omeprazole (PRILOSEC) 20 MG capsule, TAKE 1 CAPSULE BY MOUTH EVERY DAY, Disp: 90 capsule, Rfl: 2   Allergies  Allergen Reactions   Penicillins Rash    ROS Review of Systems  Constitutional: Negative.   HENT: Negative.    Eyes: Negative.   Respiratory: Negative.    Cardiovascular: Negative.   Gastrointestinal: Negative.   Endocrine: Negative.   Genitourinary: Negative.   Musculoskeletal: Negative.   Skin: Negative.   Allergic/Immunologic: Negative.   Neurological: Negative.  Negative for syncope and light-headedness.  Hematological: Negative.   Psychiatric/Behavioral: Negative.  Negative for agitation and confusion.   All other systems reviewed and are negative.    Objective:    Physical Exam Vitals reviewed.  Constitutional:      Appearance: Normal appearance.  HENT:     Mouth/Throat:     Mouth: Mucous membranes are moist.  Eyes:     Pupils: Pupils are equal, round, and reactive to light.  Neck:     Vascular: No carotid bruit.  Cardiovascular:     Rate and Rhythm: Normal rate and regular rhythm.     Pulses: Normal pulses.     Heart sounds: Normal heart sounds.  Pulmonary:     Effort: Pulmonary effort is normal.     Breath sounds: Normal breath sounds.  Abdominal:     General: Bowel sounds are normal.     Palpations: Abdomen is soft. There is no hepatomegaly, splenomegaly or mass.     Tenderness: There is no abdominal tenderness.     Hernia: No hernia is present.  Musculoskeletal:        General: No tenderness.     Cervical back: Neck supple.     Right lower leg: No edema.     Left lower leg: No edema.  Skin:    Findings: No rash.  Neurological:     Mental  Status: She is alert and oriented to person, place, and time.     Motor: No weakness.  Psychiatric:        Mood and Affect: Mood and affect normal.        Behavior: Behavior normal.    BP 139/76    Pulse 74    Ht 5' 3.5" (1.613 m)    Wt 199 lb 12.8 oz (90.6 kg)  BMI 34.84 kg/m  Wt Readings from Last 3 Encounters:  10/03/21 199 lb 12.8 oz (90.6 kg)  07/03/21 202 lb (91.6 kg)  04/03/21 201 lb 12.8 oz (91.5 kg)     Health Maintenance Due  Topic Date Due   COVID-19 Vaccine (1) Never done   Hepatitis C Screening  Never done   Zoster Vaccines- Shingrix (1 of 2) Never done   DEXA SCAN  Never done    There are no preventive care reminders to display for this patient.  No results found for: TSH Lab Results  Component Value Date   WBC 10.3 01/03/2018   HGB 11.8 (L) 01/03/2018   HCT 36.1 01/03/2018   MCV 78.4 (L) 01/03/2018   PLT 286 01/03/2018   Lab Results  Component Value Date   NA 137 01/03/2018   K 3.5 01/03/2018   CO2 27 01/03/2018   GLUCOSE 169 (H) 01/03/2018   BUN 10 01/03/2018   CREATININE 0.78 01/03/2018   BILITOT 0.6 01/23/2015   ALKPHOS 52 01/23/2015   AST 15 01/23/2015   ALT 11 (L) 01/23/2015   PROT 7.0 01/23/2015   ALBUMIN 3.1 (L) 01/23/2015   CALCIUM 8.8 (L) 01/03/2018   ANIONGAP 8 01/03/2018   No results found for: CHOL No results found for: HDL No results found for: LDLCALC No results found for: TRIG No results found for: CHOLHDL Lab Results  Component Value Date   HGBA1C 7.1 04/03/2021      Assessment & Plan:   Problem List Items Addressed This Visit       Cardiovascular and Mediastinum   Essential hypertension    Blood pressure is stable        Respiratory   Seasonal allergic rhinitis due to pollen    Take Claritin 5 mg daily        Nervous and Auditory   Bilateral deafness    Chronic problem        Other   Morbid obesity ([place])    - I encouraged the patient to lose weight.  - I educated them on making healthy dietary  choices including eating more fruits and vegetables and less fried foods. - I encouraged the patient to exercise more, and educated on the benefits of exercise including weight loss, diabetes prevention, and hypertension prevention.   Dietary counseling with a registered dietician  Referral to a weight management support group (e.g. Weight Watchers, Overeaters Anonymous)  If your BMI is greater than 29 or you have gained more than 15 pounds you should work on weight loss.  Attend a healthy cooking class       Other Visit Diagnoses     Type 2 diabetes mellitus without complication, without long-term current use of insulin ([place])    -  Primary   Relevant Orders   POCT glucose (manual entry)       Meds ordered this encounter  Medications   loratadine (CLARITIN) 10 MG tablet    Sig: Take 1 tablet (10 mg total) by mouth daily.    Dispense:  90 tablet    Refill:  3    Follow-up: No follow-ups on file.    Cletis Athens, MD

## 2021-10-03 NOTE — Assessment & Plan Note (Signed)

## 2021-10-03 NOTE — Assessment & Plan Note (Signed)
Chronic problem. 

## 2021-10-03 NOTE — Assessment & Plan Note (Signed)
Take Claritin 5 mg daily

## 2021-11-13 ENCOUNTER — Other Ambulatory Visit: Payer: Self-pay | Admitting: *Deleted

## 2021-11-13 MED ORDER — GLIPIZIDE ER 5 MG PO TB24: 15.0000 mg | ORAL_TABLET | Freq: Every day | ORAL | 1 refills | Status: DC

## 2021-12-25 ENCOUNTER — Encounter: Payer: Self-pay | Admitting: Internal Medicine

## 2022-01-01 ENCOUNTER — Ambulatory Visit: Payer: Medicare Other | Admitting: Internal Medicine

## 2022-06-08 ENCOUNTER — Other Ambulatory Visit: Payer: Self-pay | Admitting: Internal Medicine

## 2022-08-22 ENCOUNTER — Other Ambulatory Visit: Payer: Self-pay | Admitting: Internal Medicine

## 2022-08-22 DIAGNOSIS — R000 Tachycardia, unspecified: Secondary | ICD-10-CM

## 2022-09-22 ENCOUNTER — Other Ambulatory Visit: Payer: Self-pay | Admitting: Internal Medicine

## 2022-09-22 DIAGNOSIS — J301 Allergic rhinitis due to pollen: Secondary | ICD-10-CM

## 2023-11-06 LAB — LAB REPORT - SCANNED
A1c: 7.1
EGFR: 67
Free T4: 1.29 ng/dL
TSH: 1.89

## 2024-01-28 ENCOUNTER — Inpatient Hospital Stay
Admission: EM | Admit: 2024-01-28 | Discharge: 2024-02-13 | DRG: 870 | Disposition: A | Discharge status: Skilled Nursing Facility | Attending: Hospitalist | Admitting: Hospitalist

## 2024-01-28 ENCOUNTER — Inpatient Hospital Stay

## 2024-01-28 ENCOUNTER — Emergency Department

## 2024-01-28 ENCOUNTER — Other Ambulatory Visit: Payer: Self-pay

## 2024-01-28 DIAGNOSIS — J441 Chronic obstructive pulmonary disease with (acute) exacerbation: Secondary | ICD-10-CM | POA: Diagnosis present

## 2024-01-28 DIAGNOSIS — J9600 Acute respiratory failure, unspecified whether with hypoxia or hypercapnia: Secondary | ICD-10-CM | POA: Diagnosis not present

## 2024-01-28 DIAGNOSIS — J449 Chronic obstructive pulmonary disease, unspecified: Secondary | ICD-10-CM | POA: Diagnosis not present

## 2024-01-28 DIAGNOSIS — I5021 Acute systolic (congestive) heart failure: Secondary | ICD-10-CM | POA: Diagnosis present

## 2024-01-28 DIAGNOSIS — I340 Nonrheumatic mitral (valve) insufficiency: Secondary | ICD-10-CM | POA: Diagnosis present

## 2024-01-28 DIAGNOSIS — Z794 Long term (current) use of insulin: Secondary | ICD-10-CM

## 2024-01-28 DIAGNOSIS — R54 Age-related physical debility: Secondary | ICD-10-CM | POA: Diagnosis present

## 2024-01-28 DIAGNOSIS — I5041 Acute combined systolic (congestive) and diastolic (congestive) heart failure: Secondary | ICD-10-CM | POA: Diagnosis not present

## 2024-01-28 DIAGNOSIS — J440 Chronic obstructive pulmonary disease with acute lower respiratory infection: Secondary | ICD-10-CM | POA: Diagnosis present

## 2024-01-28 DIAGNOSIS — Z880 Allergy status to penicillin: Secondary | ICD-10-CM

## 2024-01-28 DIAGNOSIS — H9190 Unspecified hearing loss, unspecified ear: Secondary | ICD-10-CM | POA: Diagnosis present

## 2024-01-28 DIAGNOSIS — I5084 End stage heart failure: Secondary | ICD-10-CM | POA: Diagnosis present

## 2024-01-28 DIAGNOSIS — L89312 Pressure ulcer of right buttock, stage 2: Secondary | ICD-10-CM | POA: Diagnosis not present

## 2024-01-28 DIAGNOSIS — R570 Cardiogenic shock: Secondary | ICD-10-CM | POA: Diagnosis present

## 2024-01-28 DIAGNOSIS — I4819 Other persistent atrial fibrillation: Secondary | ICD-10-CM | POA: Diagnosis present

## 2024-01-28 DIAGNOSIS — E119 Type 2 diabetes mellitus without complications: Secondary | ICD-10-CM | POA: Diagnosis not present

## 2024-01-28 DIAGNOSIS — I10 Essential (primary) hypertension: Secondary | ICD-10-CM | POA: Diagnosis not present

## 2024-01-28 DIAGNOSIS — E874 Mixed disorder of acid-base balance: Secondary | ICD-10-CM | POA: Diagnosis present

## 2024-01-28 DIAGNOSIS — J9601 Acute respiratory failure with hypoxia: Secondary | ICD-10-CM | POA: Diagnosis present

## 2024-01-28 DIAGNOSIS — G9341 Metabolic encephalopathy: Secondary | ICD-10-CM | POA: Diagnosis present

## 2024-01-28 DIAGNOSIS — Z85028 Personal history of other malignant neoplasm of stomach: Secondary | ICD-10-CM

## 2024-01-28 DIAGNOSIS — J90 Pleural effusion, not elsewhere classified: Secondary | ICD-10-CM | POA: Diagnosis not present

## 2024-01-28 DIAGNOSIS — Z79899 Other long term (current) drug therapy: Secondary | ICD-10-CM

## 2024-01-28 DIAGNOSIS — Z7982 Long term (current) use of aspirin: Secondary | ICD-10-CM

## 2024-01-28 DIAGNOSIS — Z789 Other specified health status: Secondary | ICD-10-CM | POA: Diagnosis not present

## 2024-01-28 DIAGNOSIS — Z1152 Encounter for screening for COVID-19: Secondary | ICD-10-CM

## 2024-01-28 DIAGNOSIS — N179 Acute kidney failure, unspecified: Secondary | ICD-10-CM | POA: Diagnosis present

## 2024-01-28 DIAGNOSIS — K760 Fatty (change of) liver, not elsewhere classified: Secondary | ICD-10-CM | POA: Diagnosis present

## 2024-01-28 DIAGNOSIS — I420 Dilated cardiomyopathy: Secondary | ICD-10-CM | POA: Diagnosis present

## 2024-01-28 DIAGNOSIS — E1165 Type 2 diabetes mellitus with hyperglycemia: Secondary | ICD-10-CM | POA: Diagnosis present

## 2024-01-28 DIAGNOSIS — Z515 Encounter for palliative care: Secondary | ICD-10-CM

## 2024-01-28 DIAGNOSIS — E669 Obesity, unspecified: Secondary | ICD-10-CM | POA: Diagnosis present

## 2024-01-28 DIAGNOSIS — J189 Pneumonia, unspecified organism: Secondary | ICD-10-CM | POA: Diagnosis present

## 2024-01-28 DIAGNOSIS — R579 Shock, unspecified: Secondary | ICD-10-CM | POA: Diagnosis not present

## 2024-01-28 DIAGNOSIS — E039 Hypothyroidism, unspecified: Secondary | ICD-10-CM | POA: Diagnosis present

## 2024-01-28 DIAGNOSIS — I110 Hypertensive heart disease with heart failure: Secondary | ICD-10-CM | POA: Diagnosis present

## 2024-01-28 DIAGNOSIS — Z7189 Other specified counseling: Secondary | ICD-10-CM | POA: Diagnosis not present

## 2024-01-28 DIAGNOSIS — E876 Hypokalemia: Secondary | ICD-10-CM | POA: Diagnosis present

## 2024-01-28 DIAGNOSIS — I4891 Unspecified atrial fibrillation: Secondary | ICD-10-CM | POA: Diagnosis not present

## 2024-01-28 DIAGNOSIS — Z7901 Long term (current) use of anticoagulants: Secondary | ICD-10-CM

## 2024-01-28 DIAGNOSIS — R6521 Severe sepsis with septic shock: Secondary | ICD-10-CM | POA: Diagnosis present

## 2024-01-28 DIAGNOSIS — I214 Non-ST elevation (NSTEMI) myocardial infarction: Secondary | ICD-10-CM | POA: Diagnosis present

## 2024-01-28 DIAGNOSIS — L89152 Pressure ulcer of sacral region, stage 2: Secondary | ICD-10-CM | POA: Diagnosis not present

## 2024-01-28 DIAGNOSIS — T502X5A Adverse effect of carbonic-anhydrase inhibitors, benzothiadiazides and other diuretics, initial encounter: Secondary | ICD-10-CM | POA: Diagnosis present

## 2024-01-28 DIAGNOSIS — Z90710 Acquired absence of both cervix and uterus: Secondary | ICD-10-CM

## 2024-01-28 DIAGNOSIS — M25551 Pain in right hip: Secondary | ICD-10-CM | POA: Diagnosis not present

## 2024-01-28 DIAGNOSIS — A419 Sepsis, unspecified organism: Secondary | ICD-10-CM | POA: Diagnosis present

## 2024-01-28 DIAGNOSIS — Z7984 Long term (current) use of oral hypoglycemic drugs: Secondary | ICD-10-CM

## 2024-01-28 DIAGNOSIS — Z66 Do not resuscitate: Secondary | ICD-10-CM | POA: Diagnosis not present

## 2024-01-28 DIAGNOSIS — Z79890 Hormone replacement therapy (postmenopausal): Secondary | ICD-10-CM

## 2024-01-28 DIAGNOSIS — Z555 Less than a high school diploma: Secondary | ICD-10-CM

## 2024-01-28 DIAGNOSIS — Z9049 Acquired absence of other specified parts of digestive tract: Secondary | ICD-10-CM

## 2024-01-28 DIAGNOSIS — I2510 Atherosclerotic heart disease of native coronary artery without angina pectoris: Secondary | ICD-10-CM | POA: Diagnosis present

## 2024-01-28 DIAGNOSIS — L8990 Pressure ulcer of unspecified site, unspecified stage: Secondary | ICD-10-CM | POA: Insufficient documentation

## 2024-01-28 DIAGNOSIS — R0603 Acute respiratory distress: Secondary | ICD-10-CM | POA: Diagnosis present

## 2024-01-28 DIAGNOSIS — E8729 Other acidosis: Secondary | ICD-10-CM | POA: Diagnosis not present

## 2024-01-28 DIAGNOSIS — Z6837 Body mass index (BMI) 37.0-37.9, adult: Secondary | ICD-10-CM

## 2024-01-28 HISTORY — DX: Heart failure, unspecified: I50.9

## 2024-01-28 LAB — URINALYSIS, W/ REFLEX TO CULTURE (INFECTION SUSPECTED)
Bilirubin Urine: NEGATIVE
Glucose, UA: NEGATIVE mg/dL
Hgb urine dipstick: NEGATIVE
Ketones, ur: NEGATIVE mg/dL
Leukocytes,Ua: NEGATIVE
Nitrite: NEGATIVE
Protein, ur: 100 mg/dL — AB
Specific Gravity, Urine: 1.016 (ref 1.005–1.030)
pH: 5 (ref 5.0–8.0)

## 2024-01-28 LAB — CBC WITH DIFFERENTIAL/PLATELET
Abs Immature Granulocytes: 0.16 10*3/uL — ABNORMAL HIGH (ref 0.00–0.07)
Basophils Absolute: 0.2 10*3/uL — ABNORMAL HIGH (ref 0.0–0.1)
Basophils Relative: 1 %
Eosinophils Absolute: 0.3 10*3/uL (ref 0.0–0.5)
Eosinophils Relative: 1 %
HCT: 42.7 % (ref 36.0–46.0)
Hemoglobin: 13.1 g/dL (ref 12.0–15.0)
Immature Granulocytes: 1 %
Lymphocytes Relative: 18 %
Lymphs Abs: 4.2 10*3/uL — ABNORMAL HIGH (ref 0.7–4.0)
MCH: 26.8 pg (ref 26.0–34.0)
MCHC: 30.7 g/dL (ref 30.0–36.0)
MCV: 87.5 fL (ref 80.0–100.0)
Monocytes Absolute: 1.1 10*3/uL — ABNORMAL HIGH (ref 0.1–1.0)
Monocytes Relative: 5 %
Neutro Abs: 16.8 10*3/uL — ABNORMAL HIGH (ref 1.7–7.7)
Neutrophils Relative %: 74 %
Platelets: 495 10*3/uL — ABNORMAL HIGH (ref 150–400)
RBC: 4.88 MIL/uL (ref 3.87–5.11)
RDW: 15.9 % — ABNORMAL HIGH (ref 11.5–15.5)
WBC: 22.8 10*3/uL — ABNORMAL HIGH (ref 4.0–10.5)
nRBC: 0 % (ref 0.0–0.2)

## 2024-01-28 LAB — BASIC METABOLIC PANEL WITH GFR
Anion gap: 25 — ABNORMAL HIGH (ref 5–15)
BUN: 16 mg/dL (ref 8–23)
CO2: 12 mmol/L — ABNORMAL LOW (ref 22–32)
Calcium: 7.9 mg/dL — ABNORMAL LOW (ref 8.9–10.3)
Chloride: 97 mmol/L — ABNORMAL LOW (ref 98–111)
Creatinine, Ser: 1.59 mg/dL — ABNORMAL HIGH (ref 0.44–1.00)
GFR, Estimated: 32 mL/min — ABNORMAL LOW (ref >60)
Glucose, Bld: 376 mg/dL — ABNORMAL HIGH (ref 70–99)
Potassium: 4.2 mmol/L (ref 3.5–5.1)
Sodium: 134 mmol/L — ABNORMAL LOW (ref 135–145)

## 2024-01-28 LAB — BLOOD GAS, ARTERIAL
Acid-base deficit: 15.5 mmol/L — ABNORMAL HIGH (ref 0.0–2.0)
Bicarbonate: 13.3 mmol/L — ABNORMAL LOW (ref 20.0–28.0)
O2 Content: 10 L/min
O2 Saturation: 100 %
Patient temperature: 37
pCO2 arterial: 41 mmHg (ref 32–48)
pH, Arterial: 7.12 — CL (ref 7.35–7.45)
pO2, Arterial: 146 mmHg — ABNORMAL HIGH (ref 83–108)

## 2024-01-28 LAB — COMPREHENSIVE METABOLIC PANEL WITH GFR
ALT: 20 U/L (ref 0–44)
AST: 28 U/L (ref 15–41)
Albumin: 4 g/dL (ref 3.5–5.0)
Alkaline Phosphatase: 29 U/L — ABNORMAL LOW (ref 38–126)
Anion gap: 14 (ref 5–15)
BUN: 11 mg/dL (ref 8–23)
CO2: 22 mmol/L (ref 22–32)
Calcium: 8.8 mg/dL — ABNORMAL LOW (ref 8.9–10.3)
Chloride: 98 mmol/L (ref 98–111)
Creatinine, Ser: 1.11 mg/dL — ABNORMAL HIGH (ref 0.44–1.00)
GFR, Estimated: 50 mL/min — ABNORMAL LOW (ref >60)
Glucose, Bld: 256 mg/dL — ABNORMAL HIGH (ref 70–99)
Potassium: 3.8 mmol/L (ref 3.5–5.1)
Sodium: 134 mmol/L — ABNORMAL LOW (ref 135–145)
Total Bilirubin: 0.6 mg/dL (ref 0.0–1.2)
Total Protein: 7.6 g/dL (ref 6.5–8.1)

## 2024-01-28 LAB — COOXEMETRY PANEL
Carboxyhemoglobin: 1.8 % — ABNORMAL HIGH (ref 0.5–1.5)
Methemoglobin: 0.7 % (ref 0.0–1.5)
O2 Saturation: 71.6 %
Total hemoglobin: 12.5 g/dL (ref 12.0–16.0)
Total oxygen content: 69.8 %

## 2024-01-28 LAB — TROPONIN I (HIGH SENSITIVITY)
Troponin I (High Sensitivity): 8 ng/L (ref <18)
Troponin I (High Sensitivity): 106 ng/L (ref <18)
Troponin I (High Sensitivity): 223 ng/L (ref <18)

## 2024-01-28 LAB — PROCALCITONIN: Procalcitonin: <0.1 ng/mL

## 2024-01-28 LAB — GLUCOSE, CAPILLARY
Glucose-Capillary: 313 mg/dL — ABNORMAL HIGH (ref 70–99)
Glucose-Capillary: 356 mg/dL — ABNORMAL HIGH (ref 70–99)
Glucose-Capillary: 376 mg/dL — ABNORMAL HIGH (ref 70–99)
Glucose-Capillary: 389 mg/dL — ABNORMAL HIGH (ref 70–99)

## 2024-01-28 LAB — MRSA NEXT GEN BY PCR, NASAL: MRSA by PCR Next Gen: NOT DETECTED

## 2024-01-28 LAB — BLOOD GAS, VENOUS
Acid-base deficit: 5.9 mmol/L — ABNORMAL HIGH (ref 0.0–2.0)
Bicarbonate: 21.9 mmol/L (ref 20.0–28.0)
O2 Saturation: 84.6 %
Patient temperature: 37
pCO2, Ven: 51 mmHg (ref 44–60)
pH, Ven: 7.24 — ABNORMAL LOW (ref 7.25–7.43)
pO2, Ven: 58 mmHg — ABNORMAL HIGH (ref 32–45)

## 2024-01-28 LAB — PROTIME-INR
INR: 1 (ref 0.8–1.2)
Prothrombin Time: 13.8 s (ref 11.4–15.2)

## 2024-01-28 LAB — LACTIC ACID, PLASMA
Lactic Acid, Venous: 4.8 mmol/L (ref 0.5–1.9)
Lactic Acid, Venous: >9 mmol/L (ref 0.5–1.9)
Lactic Acid, Venous: >9 mmol/L (ref 0.5–1.9)
Lactic Acid, Venous: >9 mmol/L (ref 0.5–1.9)

## 2024-01-28 LAB — RESP PANEL BY RT-PCR (RSV, FLU A&B, COVID)  RVPGX2
Influenza A by PCR: NEGATIVE
Influenza B by PCR: NEGATIVE
Resp Syncytial Virus by PCR: NEGATIVE
SARS Coronavirus 2 by RT PCR: NEGATIVE

## 2024-01-28 LAB — PHOSPHORUS: Phosphorus: 8.4 mg/dL — ABNORMAL HIGH (ref 2.5–4.6)

## 2024-01-28 LAB — MAGNESIUM: Magnesium: 1.6 mg/dL — ABNORMAL LOW (ref 1.7–2.4)

## 2024-01-28 LAB — STREP PNEUMONIAE URINARY ANTIGEN: Strep Pneumo Urinary Antigen: NEGATIVE

## 2024-01-28 LAB — BRAIN NATRIURETIC PEPTIDE: B Natriuretic Peptide: 251.9 pg/mL — ABNORMAL HIGH (ref 0.0–100.0)

## 2024-01-28 LAB — APTT: aPTT: 78 s — ABNORMAL HIGH (ref 24–36)

## 2024-01-28 MED ORDER — HYDROCORTISONE SOD SUC (PF) 100 MG IJ SOLR
100.0000 mg | Freq: Two times a day (BID) | INTRAMUSCULAR | Status: DC
  Administered 2024-01-28 – 2024-01-31 (×6): 100 mg via INTRAVENOUS
  Filled 2024-01-28 (×6): qty 2

## 2024-01-28 MED ORDER — LACTATED RINGERS IV BOLUS (SEPSIS)
500.0000 mL | Freq: Once | INTRAVENOUS | Status: AC
  Administered 2024-01-28: 500 mL via INTRAVENOUS

## 2024-01-28 MED ORDER — STERILE WATER FOR INJECTION IV SOLN
INTRAVENOUS | Status: DC
  Filled 2024-01-28: qty 150
  Filled 2024-01-28: qty 1000
  Filled 2024-01-28: qty 150

## 2024-01-28 MED ORDER — HEPARIN BOLUS VIA INFUSION
2000.0000 [IU] | Freq: Once | INTRAVENOUS | Status: AC
  Administered 2024-01-28: 2000 [IU] via INTRAVENOUS
  Filled 2024-01-28: qty 2000

## 2024-01-28 MED ORDER — AMIODARONE HCL IN DEXTROSE 360-4.14 MG/200ML-% IV SOLN
60.0000 mg/h | INTRAVENOUS | Status: AC
  Administered 2024-01-28 (×2): 60 mg/h via INTRAVENOUS
  Filled 2024-01-28 (×2): qty 200

## 2024-01-28 MED ORDER — STERILE WATER FOR INJECTION IV SOLN
INTRAVENOUS | Status: DC
  Filled 2024-01-28: qty 1000

## 2024-01-28 MED ORDER — SODIUM CHLORIDE 0.9 % IV SOLN
2.0000 g | Freq: Once | INTRAVENOUS | Status: AC
  Administered 2024-01-28: 2 g via INTRAVENOUS
  Filled 2024-01-28: qty 20

## 2024-01-28 MED ORDER — INSULIN ASPART 100 UNIT/ML IJ SOLN: 0.0000 [IU] | INTRAMUSCULAR | Status: DC

## 2024-01-28 MED ORDER — SODIUM BICARBONATE 8.4 % IV SOLN
INTRAVENOUS | Status: AC
  Administered 2024-01-28: 50 meq via INTRAVENOUS
  Filled 2024-01-28: qty 50

## 2024-01-28 MED ORDER — KETAMINE HCL 50 MG/5ML IJ SOSY
PREFILLED_SYRINGE | INTRAMUSCULAR | Status: AC
  Filled 2024-01-28: qty 5

## 2024-01-28 MED ORDER — SODIUM BICARBONATE 8.4 % IV SOLN: 50.0000 meq | Freq: Once | INTRAVENOUS | Status: AC

## 2024-01-28 MED ORDER — AMIODARONE HCL IN DEXTROSE 360-4.14 MG/200ML-% IV SOLN
30.0000 mg/h | INTRAVENOUS | Status: DC
  Administered 2024-01-29 – 2024-02-07 (×19): 30 mg/h via INTRAVENOUS
  Filled 2024-01-28 (×19): qty 200

## 2024-01-28 MED ORDER — NOREPINEPHRINE 16 MG/250ML-% IV SOLN
0.0000 ug/min | INTRAVENOUS | Status: DC
  Administered 2024-01-28: 10 ug/min via INTRAVENOUS
  Administered 2024-01-29: 13 ug/min via INTRAVENOUS
  Administered 2024-01-29: 21 ug/min via INTRAVENOUS
  Administered 2024-01-30: 14 ug/min via INTRAVENOUS
  Administered 2024-02-03: 2 ug/min via INTRAVENOUS
  Filled 2024-01-28 (×6): qty 250

## 2024-01-28 MED ORDER — AZITHROMYCIN 500 MG IV SOLR
500.0000 mg | INTRAVENOUS | Status: DC
  Administered 2024-01-29 – 2024-01-31 (×3): 500 mg via INTRAVENOUS
  Filled 2024-01-28 (×3): qty 5

## 2024-01-28 MED ORDER — MIDAZOLAM HCL 2 MG/2ML IJ SOLN
2.0000 mg | Freq: Once | INTRAMUSCULAR | Status: AC
  Filled 2024-01-28: qty 2

## 2024-01-28 MED ORDER — MIDAZOLAM HCL 2 MG/2ML IJ SOLN
1.0000 mg | INTRAMUSCULAR | Status: DC | PRN
  Administered 2024-01-29 (×7): 2 mg via INTRAVENOUS
  Filled 2024-01-28 (×7): qty 2

## 2024-01-28 MED ORDER — DEXMEDETOMIDINE HCL IN NACL 400 MCG/100ML IV SOLN
0.0000 ug/kg/h | INTRAVENOUS | Status: DC
  Administered 2024-01-29: 0.4 ug/kg/h via INTRAVENOUS
  Administered 2024-01-29: 0.7 ug/kg/h via INTRAVENOUS
  Filled 2024-01-28: qty 100

## 2024-01-28 MED ORDER — METHYLPREDNISOLONE SODIUM SUCC 40 MG IJ SOLR
40.0000 mg | Freq: Every day | INTRAMUSCULAR | Status: DC
  Administered 2024-01-28: 40 mg via INTRAVENOUS
  Filled 2024-01-28: qty 1

## 2024-01-28 MED ORDER — AMIODARONE IV BOLUS ONLY 150 MG/100ML
INTRAVENOUS | Status: AC
  Filled 2024-01-28: qty 100

## 2024-01-28 MED ORDER — ROCURONIUM BROMIDE 10 MG/ML (PF) SYRINGE
PREFILLED_SYRINGE | INTRAVENOUS | Status: AC
  Administered 2024-01-28: 100 mg via INTRAVENOUS
  Filled 2024-01-28: qty 10

## 2024-01-28 MED ORDER — SODIUM CHLORIDE 0.9 % IV SOLN
2.0000 g | INTRAVENOUS | Status: DC
  Administered 2024-01-29: 2 g via INTRAVENOUS
  Filled 2024-01-28: qty 20

## 2024-01-28 MED ORDER — SODIUM CHLORIDE 0.9 % IV SOLN
500.0000 mg | Freq: Once | INTRAVENOUS | Status: AC
  Administered 2024-01-28: 500 mg via INTRAVENOUS
  Filled 2024-01-28: qty 5

## 2024-01-28 MED ORDER — EPINEPHRINE 1 MG/10ML IJ SOSY: 0.5000 mg | PREFILLED_SYRINGE | Freq: Once | INTRAMUSCULAR | Status: AC

## 2024-01-28 MED ORDER — MORPHINE SULFATE (PF) 4 MG/ML IV SOLN
4.0000 mg | Freq: Once | INTRAVENOUS | Status: AC
  Administered 2024-01-28: 4 mg via INTRAVENOUS
  Filled 2024-01-28: qty 1

## 2024-01-28 MED ORDER — AMIODARONE IV BOLUS ONLY 150 MG/100ML
150.0000 mg | Freq: Once | INTRAVENOUS | Status: AC
  Administered 2024-01-28: 150 mg via INTRAVENOUS
  Filled 2024-01-28: qty 100

## 2024-01-28 MED ORDER — POLYETHYLENE GLYCOL 3350 17 G PO PACK
17.0000 g | PACK | Freq: Every day | ORAL | Status: DC
  Administered 2024-01-29 – 2024-01-31 (×3): 17 g
  Filled 2024-01-28 (×4): qty 1

## 2024-01-28 MED ORDER — KETAMINE HCL 50 MG/5ML IJ SOSY
PREFILLED_SYRINGE | INTRAMUSCULAR | Status: AC
  Administered 2024-01-28: 100 mg via INTRAVENOUS
  Filled 2024-01-28: qty 10

## 2024-01-28 MED ORDER — MIDAZOLAM HCL 2 MG/2ML IJ SOLN
INTRAMUSCULAR | Status: AC
Start: 2024-01-28 — End: 2024-01-28
  Administered 2024-01-28: 2 mg via INTRAVENOUS
  Filled 2024-01-28: qty 2

## 2024-01-28 MED ORDER — ALBUTEROL SULFATE (2.5 MG/3ML) 0.083% IN NEBU
2.5000 mg | INHALATION_SOLUTION | Freq: Once | RESPIRATORY_TRACT | Status: AC
  Administered 2024-01-28: 2.5 mg via RESPIRATORY_TRACT
  Filled 2024-01-28: qty 3

## 2024-01-28 MED ORDER — CHLORHEXIDINE GLUCONATE CLOTH 2 % EX PADS
6.0000 | MEDICATED_PAD | Freq: Every day | CUTANEOUS | Status: DC
Start: 2024-01-28 — End: 2024-01-29
  Administered 2024-01-28: 6 via TOPICAL

## 2024-01-28 MED ORDER — ONDANSETRON HCL 4 MG/2ML IJ SOLN
4.0000 mg | Freq: Once | INTRAMUSCULAR | Status: AC
  Administered 2024-01-28: 4 mg via INTRAVENOUS
  Filled 2024-01-28: qty 2

## 2024-01-28 MED ORDER — AMIODARONE IV BOLUS ONLY 150 MG/100ML
150.0000 mg | Freq: Once | INTRAVENOUS | Status: AC
  Administered 2024-01-28: 150 mg via INTRAVENOUS

## 2024-01-28 MED ORDER — SODIUM CHLORIDE 0.9 % IV SOLN: 12.5000 mg | Freq: Four times a day (QID) | INTRAVENOUS | Status: DC | PRN

## 2024-01-28 MED ORDER — DIGOXIN 0.25 MG/ML IJ SOLN
0.0625 mg | Freq: Every day | INTRAMUSCULAR | Status: DC
  Administered 2024-01-28 – 2024-01-30 (×3): 0.0625 mg via INTRAVENOUS
  Filled 2024-01-28 (×3): qty 2

## 2024-01-28 MED ORDER — LORAZEPAM 2 MG/ML IJ SOLN
0.5000 mg | Freq: Once | INTRAMUSCULAR | Status: AC
  Administered 2024-01-28: 0.5 mg via INTRAVENOUS
  Filled 2024-01-28: qty 1

## 2024-01-28 MED ORDER — ONDANSETRON HCL 4 MG/2ML IJ SOLN
4.0000 mg | Freq: Four times a day (QID) | INTRAMUSCULAR | Status: DC | PRN
  Administered 2024-01-28: 4 mg via INTRAVENOUS
  Filled 2024-01-28: qty 2

## 2024-01-28 MED ORDER — NOREPINEPHRINE 4 MG/250ML-% IV SOLN
2.0000 ug/min | INTRAVENOUS | Status: DC
  Administered 2024-01-28: 2 ug/min via INTRAVENOUS
  Filled 2024-01-28: qty 250

## 2024-01-28 MED ORDER — EPINEPHRINE HCL 5 MG/250ML IV SOLN IN NS
0.5000 ug/min | INTRAVENOUS | Status: DC
  Administered 2024-01-28: 0.5 ug/min via INTRAVENOUS
  Filled 2024-01-28: qty 250

## 2024-01-28 MED ORDER — LACTATED RINGERS IV BOLUS (SEPSIS)
1000.0000 mL | Freq: Once | INTRAVENOUS | Status: AC
  Administered 2024-01-28: 1000 mL via INTRAVENOUS

## 2024-01-28 MED ORDER — LORAZEPAM 2 MG/ML IJ SOLN: 0.5000 mg | Freq: Once | INTRAMUSCULAR | Status: DC

## 2024-01-28 MED ORDER — KETAMINE HCL 50 MG/5ML IJ SOSY: 100.0000 mg | PREFILLED_SYRINGE | Freq: Once | INTRAMUSCULAR | Status: AC

## 2024-01-28 MED ORDER — FENTANYL 2500MCG IN NS 250ML (10MCG/ML) PREMIX INFUSION
INTRAVENOUS | Status: AC
  Administered 2024-01-28: 25 ug/h via INTRAVENOUS
  Filled 2024-01-28: qty 250

## 2024-01-28 MED ORDER — IPRATROPIUM-ALBUTEROL 0.5-2.5 (3) MG/3ML IN SOLN
3.0000 mL | RESPIRATORY_TRACT | Status: DC
  Administered 2024-01-28 – 2024-01-30 (×11): 3 mL via RESPIRATORY_TRACT
  Filled 2024-01-28 (×14): qty 3

## 2024-01-28 MED ORDER — DOCUSATE SODIUM 100 MG PO CAPS: 100.0000 mg | ORAL_CAPSULE | Freq: Two times a day (BID) | ORAL | Status: DC | PRN

## 2024-01-28 MED ORDER — VASOPRESSIN 20 UNITS/100 ML INFUSION FOR SHOCK
0.0000 [IU]/min | INTRAVENOUS | Status: DC
  Administered 2024-01-28 – 2024-01-29 (×2): 0.03 [IU]/min via INTRAVENOUS
  Administered 2024-01-29 – 2024-01-31 (×7): 0.04 [IU]/min via INTRAVENOUS
  Administered 2024-02-01: 0.02 [IU]/min via INTRAVENOUS
  Administered 2024-02-01: 0.03 [IU]/min via INTRAVENOUS
  Administered 2024-02-01: 0.04 [IU]/min via INTRAVENOUS
  Administered 2024-02-02: 0.03 [IU]/min via INTRAVENOUS
  Filled 2024-01-28 (×16): qty 100

## 2024-01-28 MED ORDER — HYDROCORTISONE SOD SUC (PF) 100 MG IJ SOLR: 50.0000 mg | Freq: Four times a day (QID) | INTRAMUSCULAR | Status: DC

## 2024-01-28 MED ORDER — DILTIAZEM HCL 25 MG/5ML IV SOLN
5.0000 mg | Freq: Once | INTRAVENOUS | Status: AC
  Administered 2024-01-28: 5 mg via INTRAVENOUS
  Filled 2024-01-28: qty 5

## 2024-01-28 MED ORDER — LACTATED RINGERS IV BOLUS
500.0000 mL | Freq: Once | INTRAVENOUS | Status: AC
  Administered 2024-01-28: 500 mL via INTRAVENOUS

## 2024-01-28 MED ORDER — ORAL CARE MOUTH RINSE: 15.0000 mL | OROMUCOSAL | Status: DC | PRN

## 2024-01-28 MED ORDER — INSULIN ASPART 100 UNIT/ML IJ SOLN
10.0000 [IU] | Freq: Once | INTRAMUSCULAR | Status: AC
  Administered 2024-01-28: 10 [IU] via SUBCUTANEOUS

## 2024-01-28 MED ORDER — SODIUM BICARBONATE 8.4 % IV SOLN
50.0000 meq | Freq: Once | INTRAVENOUS | Status: AC
  Administered 2024-01-28: 50 meq via INTRAVENOUS
  Filled 2024-01-28: qty 50

## 2024-01-28 MED ORDER — ORAL CARE MOUTH RINSE
15.0000 mL | OROMUCOSAL | Status: DC
  Administered 2024-01-28 – 2024-02-06 (×105): 15 mL via OROMUCOSAL

## 2024-01-28 MED ORDER — IOHEXOL 350 MG/ML SOLN
125.0000 mL | Freq: Once | INTRAVENOUS | Status: AC | PRN
  Administered 2024-01-28: 125 mL via INTRAVENOUS

## 2024-01-28 MED ORDER — ROCURONIUM BROMIDE 10 MG/ML (PF) SYRINGE
100.0000 mg | PREFILLED_SYRINGE | Freq: Once | INTRAVENOUS | Status: AC
  Filled 2024-01-28: qty 10

## 2024-01-28 MED ORDER — DOCUSATE SODIUM 50 MG/5ML PO LIQD
100.0000 mg | Freq: Two times a day (BID) | ORAL | Status: DC
  Administered 2024-01-29 – 2024-01-31 (×6): 100 mg
  Filled 2024-01-28 (×7): qty 10

## 2024-01-28 MED ORDER — INSULIN ASPART 100 UNIT/ML IJ SOLN
0.0000 [IU] | INTRAMUSCULAR | Status: DC
  Administered 2024-01-28 (×2): 20 [IU] via SUBCUTANEOUS
  Filled 2024-01-28 (×2): qty 1

## 2024-01-28 MED ORDER — POLYETHYLENE GLYCOL 3350 17 G PO PACK: 17.0000 g | PACK | Freq: Every day | ORAL | Status: DC | PRN

## 2024-01-28 MED ORDER — HEPARIN SODIUM (PORCINE) 5000 UNIT/ML IJ SOLN
5000.0000 [IU] | Freq: Three times a day (TID) | INTRAMUSCULAR | Status: DC
  Administered 2024-01-28: 5000 [IU] via SUBCUTANEOUS
  Filled 2024-01-28: qty 1

## 2024-01-28 MED ORDER — AMIODARONE LOAD VIA INFUSION
150.0000 mg | Freq: Once | INTRAVENOUS | Status: AC
  Administered 2024-01-28: 150 mg via INTRAVENOUS
  Filled 2024-01-28: qty 83.34

## 2024-01-28 MED ORDER — PANTOPRAZOLE SODIUM 40 MG IV SOLR
40.0000 mg | Freq: Every day | INTRAVENOUS | Status: DC
  Administered 2024-01-28 – 2024-02-07 (×11): 40 mg via INTRAVENOUS
  Filled 2024-01-28 (×11): qty 10

## 2024-01-28 MED ORDER — FENTANYL 2500MCG IN NS 250ML (10MCG/ML) PREMIX INFUSION
0.0000 ug/h | INTRAVENOUS | Status: DC
  Administered 2024-01-29 – 2024-01-30 (×2): 200 ug/h via INTRAVENOUS
  Administered 2024-01-30 – 2024-02-01 (×4): 175 ug/h via INTRAVENOUS
  Filled 2024-01-28 (×6): qty 250

## 2024-01-28 MED ORDER — EPINEPHRINE 1 MG/10ML IJ SOSY
PREFILLED_SYRINGE | INTRAMUSCULAR | Status: AC
  Administered 2024-01-28: 0.5 mg via INTRAVENOUS
  Filled 2024-01-28: qty 10

## 2024-01-28 MED ORDER — MAGNESIUM SULFATE 2 GM/50ML IV SOLN
2.0000 g | Freq: Once | INTRAVENOUS | Status: AC
  Administered 2024-01-28: 2 g via INTRAVENOUS
  Filled 2024-01-28: qty 50

## 2024-01-28 MED ORDER — SODIUM CHLORIDE 0.9 % IV SOLN
250.0000 mL | INTRAVENOUS | Status: AC
  Administered 2024-01-28: 250 mL via INTRAVENOUS

## 2024-01-28 MED ORDER — HEPARIN (PORCINE) 25000 UT/250ML-% IV SOLN
1000.0000 [IU]/h | INTRAVENOUS | Status: DC
  Administered 2024-01-28 – 2024-01-29 (×2): 1100 [IU]/h via INTRAVENOUS
  Administered 2024-01-30 – 2024-02-01 (×2): 1000 [IU]/h via INTRAVENOUS
  Filled 2024-01-28 (×4): qty 250

## 2024-01-28 MED ORDER — DEXMEDETOMIDINE HCL IN NACL 400 MCG/100ML IV SOLN
INTRAVENOUS | Status: AC
  Administered 2024-01-28: 0.4 ug/kg/h via INTRAVENOUS
  Filled 2024-01-28: qty 100

## 2024-01-28 NOTE — ED Notes (Signed)
 Was unable to update PMH as pt came in on cpap and was hard to communicate with. Daughter in law stepped out to call family. For now, pt is full code per EDP.

## 2024-01-28 NOTE — ED Notes (Signed)
 Pt was vomiting. Had to remove bipap mask. Pt desaturated to 82%. EDP Siadecki informed. Breathing still labored.

## 2024-01-28 NOTE — ED Notes (Signed)
 EDP removed bipap mask per request and placed on [place]. Very labored. EDP explained importance of keeping bipap on. Bipap back on pt. 0.5mg  ativan verbal order.

## 2024-01-28 NOTE — ED Notes (Addendum)
Daughter in law at bedside

## 2024-01-28 NOTE — Progress Notes (Addendum)
 Patient arrived onto ICU with increased work of breathing on high flow nasal cannula. Attempted to get an oral and axillary temperature unsuccessfully. Unable to roll patient at this time to obtain rectal temperature due to unstable respiratory status and severe increased work of breathing. Will attempt another rectal temperature when patient's respiratory status is stable.

## 2024-01-28 NOTE — ED Notes (Signed)
 Pt tolerating bipap much better.

## 2024-01-28 NOTE — ED Notes (Signed)
 Have been unable to leave pt. Pt not tolerating bipap well. EDP called to bedside to reassure pt and daughter. Pt has removed mask twice to spit.

## 2024-01-28 NOTE — IPAL (Signed)
  Interdisciplinary Goals of Care Family Meeting   Date carried out: 01/28/2024  Location of the meeting: Bedside  Member's involved: Nurse Practitioner, Bedside Registered Nurse, and Family Member or next of kin  Durable Power of Attorney or acting medical decision maker: Pt's daughter-in-law Tanya Frye (her husband/pt's son recently passed away, pt has multiple grandchildren and 1 sibling)  Discussion: We discussed goals of care for Tanya Frye .  Discussed that [name] is critically ill with Acute Hypoxic Respiratory Failure and Severe Sepsis with Septic Shock due to Pneumonia, along with A.fib with RVR and concern for potential CHF exacerbation.  With multiorgan failure and tenuous respiratory status on BiPAP,  her prognosis is guarded with high risk for further decompensation needing intubation, along with cardiac arrest and death.  Elicited goals of care conversation. We discussed code status, Full Code versus Do Not Resuscitate, Encouraged patient/family to consider DNR/DNI status understanding evidenced based poor outcomes in similar hospitalized patients, as the cause of the arrest is likely associated with chronic/terminal disease rather than a reversible acute cardio-pulmonary event.  Discussed that given her advanced age and multiple chronic co morbidities, if she were to require intubation, if would likely be very difficult to liberate her from the vent.  Jodie reports that pt has never explicitly discussed code status or ventilator before, and doesn't know of any documentation.  She feels that patient would not likely want CPR or ventilator, but feels she needs to discuss this with the rest of the family (pt's grandchildren, and pt's sibling) before making a decision.   Will continue current interventions and remains Full Code at present.  Will consult Palliative Care to assist with GOC conversations.   Code status:   Code Status: Full Code   Disposition: Continue current acute  care  Time spent for the meeting: 20 minutes   Cherylann Corpus, AGACNP-BC [place]  Pulmonary & Critical Care Prefer epic messenger for cross cover needs If after hours, please call E-link  Delanna Fears, NP  01/28/2024, 2:38 PM

## 2024-01-28 NOTE — Progress Notes (Signed)
 Patent intubated by Shyrl Doyne with 7.5 ETT, 22 cm at lip assisted by Amy with cardio pulmonary.  Condensation noted in ETT, positive color change noted on c02 detector, equal bilateral breath sounds.  CXR pending

## 2024-01-28 NOTE — Procedures (Signed)
 Central Venous Catheter Insertion Procedure Note  Tanya Frye  [id number]  1941-03-20  Date:01/28/24  Time:5:33 PM   Provider Performing:Leslye Puccini D [name]   Procedure: Insertion of Non-tunneled Central Venous Catheter(36556) with US  guidance (40981)   Indication(s) Medication administration and Difficult access  Consent Unable to obtain consent due to emergent nature of procedure.  Anesthesia Topical only with 1% lidocaine    Timeout Verified patient identification, verified procedure, site/side was marked, verified correct patient position, special equipment/implants available, medications/allergies/relevant history reviewed, required imaging and test results available.  Sterile Technique Maximal sterile technique including full sterile barrier drape, hand hygiene, sterile gown, sterile gloves, mask, hair covering, sterile ultrasound probe cover (if used).  Procedure Description Area of catheter insertion was cleaned with chlorhexidine and draped in sterile fashion.  With real-time ultrasound guidance a central venous catheter was placed into the right femoral vein. Nonpulsatile blood flow and easy flushing noted in all ports.  The catheter was sutured in place and sterile dressing applied.  Complications/Tolerance None; patient tolerated the procedure well. Chest X-ray is ordered to verify placement for internal jugular or subclavian cannulation.   Chest x-ray is not ordered for femoral cannulation.  EBL Minimal  Specimen(s) None   Line inserted to the 20 cm mark.   Tanya Frye, AGACNP-BC [facility] Prefer epic messenger for cross cover needs If after hours, please call E-link

## 2024-01-28 NOTE — ED Notes (Signed)
 Lab at bedside to draw cultures #2. Abx have already been started.

## 2024-01-28 NOTE — Progress Notes (Signed)
 PHARMACY CONSULT NOTE - ELECTROLYTES  Pharmacy Consult for Electrolyte Monitoring and Replacement   Recent Labs: Height: 5\' 4"  (162.6 cm) Weight: 89.4 kg (197 lb 1.5 oz) IBW/kg (Calculated) : 54.7 Estimated Creatinine Clearance: 43 mL/min (A) (by C-G formula based on SCr of 1.11 mg/dL (H)). Potassium (mmol/L)  Date Value  01/28/2024 3.8   Calcium (mg/dL)  Date Value  16/06/9603 8.8 (L)   Albumin (g/dL)  Date Value  54/05/8118 4.0   Sodium (mmol/L)  Date Value  01/28/2024 134 (L)   Assessment  Tanya Frye is a 83 y.o. female presenting with septic shock 2/2 CAP. PMH significant for COPD, CHF,  hypertension, T2DM. Pharmacy has been consulted to monitor and replace electrolytes.  Diet: NPO Pertinent medications: N/A  Goal of Therapy: Electrolytes WNL  Plan:  K 3.8: No replacement indicated today Will check Mg and Phos today Check BMP, Mg, Phos with AM labs  Thank you for allowing pharmacy to be a part of this patient's care.  Ananias Balls, PharmD Clinical Pharmacist 01/28/2024 4:56 PM

## 2024-01-28 NOTE — ED Provider Notes (Signed)
 [facility] Provider Note    Event Date/Time   First MD Initiated Contact with Patient 01/28/24 [phone]     (approximate)   History   Respiratory Distress   HPI  Tanya Frye is a 83 y.o. female with a history of COPD, hypertension, and diabetes who presents with shortness of breath, acute onset this morning.  The patient is unable to give much other history due to respiratory distress.  EMS reports that her O2 saturation was 90% on room air when they arrived and they put her on a nonrebreather and subsequently CPAP.  I reviewed the past medical records.  The patient most recent documented outpatient encounter in the [facility] system was in 2023 with primary care for hypertension.  She was last hospitalized in 2016.   Physical Exam   Triage Vital Signs: ED Triage Vitals  Encounter Vitals Group     BP 01/28/24 0919 (!) 146/119     Systolic BP Percentile --      Diastolic BP Percentile --      Pulse Rate 01/28/24 0919 (!) 128     Resp 01/28/24 0919 (!) 33     Temp 01/28/24 0919 (!) 96.9 F (36.1 C)     Temp Source 01/28/24 0919 Axillary     SpO2 01/28/24 0913 99 %     Weight 01/28/24 0921 180 lb (81.6 kg)     Height 01/28/24 0921 5\' 1"  (1.549 m)     Head Circumference --      Peak Flow --      Pain Score --      Pain Loc --      Pain Education --      Exclude from Growth Chart --     Most recent vital signs: Vitals:   01/28/24 1500 01/28/24 1535  BP: 102/71 95/77  Pulse: (!) 162 (!) 132  Resp:  (!) 22  Temp:    SpO2: 99% 97%     General: Alert, respiratory distress. CV:  Good peripheral perfusion.  Resp:  Increased respiratory effort with accessory muscle use.  Diminished breath sounds bilaterally with some wheezing. Abd:  No distention.  Other:  1+ bilateral lower extremity edema and chronic venous stasis changes.   ED Results / Procedures / Treatments   Labs (all labs ordered are listed, but only abnormal results are displayed) Labs  Reviewed  COMPREHENSIVE METABOLIC PANEL WITH GFR - Abnormal; Notable for the following components:      Result Value   Sodium 134 (*)    Glucose, Bld 256 (*)    Creatinine, Ser 1.11 (*)    Calcium 8.8 (*)    Alkaline Phosphatase 29 (*)    GFR, Estimated 50 (*)    All other components within normal limits  LACTIC ACID, PLASMA - Abnormal; Notable for the following components:   Lactic Acid, Venous 4.8 (*)    All other components within normal limits  LACTIC ACID, PLASMA - Abnormal; Notable for the following components:   Lactic Acid, Venous >9.0 (*)    All other components within normal limits  CBC WITH DIFFERENTIAL/PLATELET - Abnormal; Notable for the following components:   WBC 22.8 (*)    RDW 15.9 (*)    Platelets 495 (*)    Neutro Abs 16.8 (*)    Lymphs Abs 4.2 (*)    Monocytes Absolute 1.1 (*)    Basophils Absolute 0.2 (*)    Abs Immature Granulocytes 0.16 (*)  All other components within normal limits  BLOOD GAS, VENOUS - Abnormal; Notable for the following components:   pH, Ven 7.24 (*)    pO2, Ven 58 (*)    Acid-base deficit 5.9 (*)    All other components within normal limits  BRAIN NATRIURETIC PEPTIDE - Abnormal; Notable for the following components:   B Natriuretic Peptide 251.9 (*)    All other components within normal limits  RESP PANEL BY RT-PCR (RSV, FLU A&B, COVID)  RVPGX2  CULTURE, BLOOD (ROUTINE X 2)  CULTURE, BLOOD (ROUTINE X 2)  RESPIRATORY PANEL BY PCR  EXPECTORATED SPUTUM ASSESSMENT W GRAM STAIN, RFLX TO RESP C  MRSA NEXT GEN BY PCR, NASAL  PROTIME-INR  URINALYSIS, W/ REFLEX TO CULTURE (INFECTION SUSPECTED)  STREP PNEUMONIAE URINARY ANTIGEN  LEGIONELLA PNEUMOPHILA SEROGP 1 UR AG  PROCALCITONIN  MAGNESIUM  PHOSPHORUS  THYROID PANEL WITH TSH  LACTIC ACID, PLASMA  LACTIC ACID, PLASMA  TROPONIN I (HIGH SENSITIVITY)  TROPONIN I (HIGH SENSITIVITY)     EKG  ED ECG REPORT I, Lind Repine, the attending physician, personally viewed and  interpreted this ECG.  Date: 01/28/2024 EKG Time:  Rate:  Rhythm:  QRS Axis:  Intervals:  ST/T Wave abnormalities:  Narrative Interpretation:     RADIOLOGY  Chest x-ray: I independently viewed and interpreted the images; there is vascular congestion and bibasilar opacities   PROCEDURES:  Critical Care performed: Yes, see critical care procedure note(s)  .Critical Care  Performed by: Lind Repine, MD Authorized by: Lind Repine, MD   Critical care provider statement:    Critical care time (minutes):  60   Critical care time was exclusive of:  Separately billable procedures and treating other patients   Critical care was necessary to treat or prevent imminent or life-threatening deterioration of the following conditions:  Respiratory failure   Critical care was time spent personally by me on the following activities:  Development of treatment plan with patient or surrogate, discussions with consultants, evaluation of patient's response to treatment, examination of patient, ordering and review of laboratory studies, ordering and review of radiographic studies, ordering and performing treatments and interventions, pulse oximetry, re-evaluation of patient's condition, review of old charts and obtaining history from patient or surrogate   Care discussed with: admitting provider      MEDICATIONS ORDERED IN ED: Medications  methylPREDNISolone sodium succinate (SOLU-MEDROL) 40 mg/mL injection 40 mg (40 mg Intravenous Given 01/28/24 1353)  azithromycin (ZITHROMAX) 500 mg in sodium chloride 0.9 % 250 mL IVPB (has no administration in time range)  cefTRIAXone (ROCEPHIN) 2 g in sodium chloride 0.9 % 100 mL IVPB (has no administration in time range)  ipratropium-albuterol (DUONEB) 0.5-2.5 (3) MG/3ML nebulizer solution 3 mL (3 mLs Nebulization Given 01/28/24 1353)  0.9 %  sodium chloride infusion (has no administration in time range)  norepinephrine (LEVOPHED) 4mg  in (0.016  mg/mL) premix infusion (2 mcg/min Intravenous New Bag/Given 01/28/24 1411)  ondansetron  (ZOFRAN ) injection 4 mg (has no administration in time range)  promethazine (PHENERGAN) 12.5 mg in sodium chloride 0.9 % 50 mL IVPB (has no administration in time range)  docusate sodium (COLACE) capsule 100 mg (has no administration in time range)  polyethylene glycol (MIRALAX / GLYCOLAX) packet 17 g (has no administration in time range)  heparin injection 5,000 Units (5,000 Units Subcutaneous Given 01/28/24 1503)  albuterol (PROVENTIL) (2.5 MG/3ML) 0.083% nebulizer solution 2.5 mg (2.5 mg Nebulization Given 01/28/24 0934)  albuterol (PROVENTIL) (2.5 MG/3ML) 0.083% nebulizer solution 2.5 mg (2.5 mg  Nebulization Given 01/28/24 0934)  LORazepam (ATIVAN) injection 0.5 mg (0.5 mg Intravenous Given 01/28/24 0951)  cefTRIAXone (ROCEPHIN) 2 g in sodium chloride 0.9 % 100 mL IVPB (0 g Intravenous Stopped 01/28/24 1045)  azithromycin (ZITHROMAX) 500 mg in sodium chloride 0.9 % 250 mL IVPB (0 mg Intravenous Stopped 01/28/24 1135)  lactated ringers bolus 1,000 mL (0 mLs Intravenous Stopped 01/28/24 1353)    And  lactated ringers bolus 1,000 mL (0 mLs Intravenous Stopped 01/28/24 1119)    And  lactated ringers bolus 500 mL (0 mLs Intravenous Stopped 01/28/24 1353)  ondansetron  (ZOFRAN ) injection 4 mg (4 mg Intravenous Given 01/28/24 1048)  diltiazem (CARDIZEM) injection 5 mg (5 mg Intravenous Given 01/28/24 1055)  morphine (PF) 4 MG/ML injection 4 mg (4 mg Intravenous Given 01/28/24 1131)  amiodarone (NEXTERONE) 1.5 mg/mL IV bolus only 150 mg (150 mg Intravenous New Bag/Given 01/28/24 1503)     IMPRESSION / MDM / ASSESSMENT AND PLAN / ED COURSE  I reviewed the triage vital signs and the nursing notes.  83 year old female with PMH as noted above presents with acute onset of shortness of breath this morning.  The patient is unable to give much other history due to her respiratory distress.  On exam she is tachypneic with increased work of  breathing.  Breath sounds are diminished bilaterally.  She arrives on CPAP and was switched to BiPAP.  She is tachycardic to the 130s and the rhythm appears to be atrial fibrillation.  Other vital signs are normal except for her respiratory rate.  Differential diagnosis includes, but is not limited to, COPD exacerbation, new onset CHF, pneumonia, acute bronchitis, viral syndrome such as RSV or COVID.  We will keep the patient on BiPAP, obtain chest x-ray, lab workup, and reassess.  Patient's presentation is most consistent with acute presentation with potential threat to life or bodily function.  The patient is on the cardiac monitor to evaluate for evidence of arrhythmia and/or significant heart rate changes.  ----------------------------------------- 11:11 AM on 01/28/2024 -----------------------------------------  Lab workup so far significant for VBG showing a normal pCO2.  Lactate is elevated.  WBC count is significantly elevated.  BNP is only minimally elevated.  Chest x-ray shows bibasilar infiltrates.  Overall this suggests pneumonia/sepsis although there could be some component of acute CHF.  Given the significantly elevated lactic acid, however, I ordered fluids per the sepsis protocol, as well was empiric antibiotics for CAP; possible CHF is not a contraindication for fluid resuscitation.  The patient had difficulty tolerating the BiPAP initially.  We tried to change her to a nasal cannula, but her work of breathing was too high.  I gave a low-dose of Ativan for anxiolysis and the patient is now tolerating the BiPAP well although remains tachypneic.  Per the family the patient does not have any advanced directives and is full code.  I did discuss the possibility of intubation if she has any further respiratory decompensation.  ----------------------------------------- 2:09 PM on 01/28/2024 -----------------------------------------  The patient was reporting some upper abdominal pain,  likely from her accessory muscle use.  She got a dose of morphine.  After this she looks more comfortable and her respiratory rate has improved.  Her work of breathing is decreased somewhat.  She is still oxygenating well.  I initially consulted Dr. Guss Legacy from the hospitalist service; based on her discussion she agreed to evaluate the patient for admission.  However when she arrived to see the patient, she had become somewhat hypotensive despite the  fluids.  We decided it would be best to have the ICU evaluate.  I consulted and discussed the case with Lina Render the APP from the ICU who came and evaluated the patient.  He recommended starting the patient on a Levophed drip and admitting to the ICU.  He agrees with continuing BiPAP for now, rather than requiring intubation.  Throughout the stay, I have consulted with and That family updated on the plan of care.  They are in agreement with ICU admission.  At this time, the patient is full code, but they will discuss advance directives further with ICU providers.    FINAL CLINICAL IMPRESSION(S) / ED DIAGNOSES   Final diagnoses:  Acute respiratory failure, unspecified whether with hypoxia or hypercapnia (HCC)  Sepsis, due to unspecified organism, unspecified whether acute organ dysfunction present [facility])     Rx / DC Orders   ED Discharge Orders     None        Note:  This document was prepared using Dragon voice recognition software and may include unintentional dictation errors.    Lind Repine, MD 01/28/24 1536

## 2024-01-28 NOTE — ED Notes (Signed)
 CN on way to help get pt pants off and place purewick.

## 2024-01-28 NOTE — ED Triage Notes (Signed)
 to ED from home [facility], SOB since this morning. Arrives on cpap  EMS VS: 90% RA, 98% on NRB with EMS 180/120 initial BP, placed on cpap, 159/78 last BP, HR 160 a fib, no history of same, CBG 194  Meds given: 125mg  solu medrol, 10mg  diltiazem, 1 duoneb 18# R AC  RT, EDP, CN at bedside. Pt has noticeably labored breathing. Being placed on bipap

## 2024-01-28 NOTE — Consult Note (Signed)
 PHARMACY - ANTICOAGULATION CONSULT NOTE  Pharmacy Consult for Heparin Indication: atrial fibrillation  Allergies  Allergen Reactions   Penicillins Rash    Patient Measurements: Height: 5\' 4"  (162.6 cm) Weight: 89.4 kg (197 lb 1.5 oz) IBW/kg (Calculated) : 54.7 HEPARIN DW (KG): 74.7  Vital Signs: Temp: 97.7 F (36.5 C) (05/07 2000) Temp Source: Oral (05/07 2000) BP: 86/66 (05/07 2000) Pulse Rate: 112 (05/07 2015)  Labs: Recent Labs    01/28/24 0914 01/28/24 0915 01/28/24 1747  HGB 13.1  --   --   HCT 42.7  --   --   PLT 495*  --   --   LABPROT 13.8  --   --   INR 1.0  --   --   CREATININE 1.11*  --   --   TROPONINIHS  --  8 106*    Estimated Creatinine Clearance: 43 mL/min (A) (by C-G formula based on SCr of 1.11 mg/dL (H)).   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 female admitted with Acute Hypoxic Respiratory Failure and Severe Sepsis with Septic Shock due to Community Acquired Pneumonia, along with concern for Acute decompensated CHF and Cardiogenic shock, along with A. Fib w/ RVR now requiring intubation. Pharmacy has been consulted to initiate and dose continuous heparin infusion.  Baseline labs:   Goal of Therapy:  Heparin level 0.3-0.7 units/ml Monitor platelets by anticoagulation protocol: Yes   Plan:  Give 2000 units bolus x 1(giving half bolus initially since recent 5,000 unit SQH administration) Start heparin infusion at 1100 units/hr Check anti-Xa level in 8 hours with morning labs Continue to monitor H&H and platelets  Kwesi Sangha A Shyenne Maggard 01/28/2024,8:20 PM

## 2024-01-28 NOTE — ED Notes (Signed)
 Unable to get second cultures before starting abx.

## 2024-01-28 NOTE — Procedures (Signed)
 Arterial Catheter Insertion Procedure Note  OLGIE REEDUS  [id number]  Mar 13, 1941  Date:01/28/24  Time:5:34 PM    Provider Performing: Delanna Fears    Procedure: Insertion of Arterial Line (40981) with US  guidance (19147)   Indication(s) Blood pressure monitoring and/or need for frequent ABGs  Consent Unable to obtain consent due to emergent nature of procedure.  Anesthesia None   Time Out Verified patient identification, verified procedure, site/side was marked, verified correct patient position, special equipment/implants available, medications/allergies/relevant history reviewed, required imaging and test results available.   Sterile Technique Maximal sterile technique including full sterile barrier drape, hand hygiene, sterile gown, sterile gloves, mask, hair covering, sterile ultrasound probe cover (if used).   Procedure Description Area of catheter insertion was cleaned with chlorhexidine and draped in sterile fashion. With real-time ultrasound guidance an arterial catheter was placed into the right femoral artery.  Appropriate arterial tracings confirmed on monitor.     Complications/Tolerance None; patient tolerated the procedure well.   EBL Minimal   Specimen(s) None    Cherylann Corpus, AGACNP-BC [facility] Prefer epic messenger for cross cover needs If after hours, please call E-link

## 2024-01-28 NOTE — ED Notes (Signed)
 Called lab to draw second blood cultures.

## 2024-01-28 NOTE — H&P (Cosign Needed Addendum)
 NAME:  Tanya Frye, MRN:  [id number], DOB:  Aug 18, 1941, LOS: 0 ADMISSION DATE:  01/28/2024, CONSULTATION DATE:  01/28/2024 REFERRING MD:  Dr. Demetrios Finders, CHIEF COMPLAINT:  Shortness of Breath/Acute Respiratory Distress   Brief Pt Description / Synopsis:  83 y.o. female admitted with Acute Hypoxic Respiratory Failure and Severe Sepsis with Septic Shock due to Community Acquired Pneumonia, along with concern for Acute decompensated CHF and Cardiogenic shock, along with A. Fib w/ RVR.  Requiring BiPAP, high risk for intubation.   History of Present Illness:  Tanya Frye is a 83 year old female with a past medical history significant for COPD, CHF,  hypertension, diabetes mellitus, and hard of hearing who presents to [facility] ED on 01/28/2024 due to shortness of breath.  Patient is currently unable to contribute to history due to acute respiratory distress, BiPAP, and being extremely hard of hearing, therefore history is obtained from chart review.  Per report she acutely developed respiratory distress earlier this morning.  Upon EMS arrival she was noted to be hypoxic with O2 saturations of 90% on room air which they placed her on CPAP en route to the hospital.  Given her respiratory distress upon arrival, she was transitioned to BiPAP.   ED Course: Initial Vital Signs: Temperature 96.9 F axillary, pulse 128, respiratory rate 33, blood pressure 146/119, SpO2 99% on BiPAP Significant Labs: Sodium 134, glucose 256, BUN 11, creatinine 1.1, BNP 251, lactic acid 4.8, high-sensitivity troponin 8, WBC 22.8 with neutrophilia VBG: pH 7.24/pCO2 51/pO2 58/bicarb 21.9 COVID-19/flu/RSV PCR is negative Imaging Chest X-ray>>IMPRESSION: 1. Mild cardiomegaly and vascular congestion. 2. Small bilateral pleural effusions and bibasilar atelectasis or infiltrate. Medications Administered: 2.5 L of LR boluses, IV azithromycin and ceftriaxone, 0.5 mg IV Ativan, 4 mg morphine, 5 mg IV Cardizem  Following fluid resuscitation in  the ED, systolic blood pressure decreasing to the 70s and 80s requiring initiation of peripheral Levophed.  PCCM asked to admit for further workup and treatment.  Please see "Significant Hospital Events" section below for full detailed hospital course.   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   CHF (congestive heart failure) (HCC)    Diabetes mellitus without complication (HCC)    HOH (hard of hearing)    extremely HOH   Hypertension    Palpitations    occasional related to meds/heart races    Micro Data:  5/7: COVID/flu/RSV PCR>> negative 5/7: RVP>> 5/7: Blood culture x 2>> 5/7: Sputum>> 5/7: Strep pneumo and Legionella urine antigens>>  Antimicrobials:   Anti-infectives (From admission, onward)    Start     Dose/Rate Route Frequency Ordered Stop   01/29/24 1000  azithromycin (ZITHROMAX) 500 mg in sodium chloride 0.9 % 250 mL IVPB        500 mg 250 mL/hr over 60 Minutes Intravenous Every 24 hours 01/28/24 1343 02/02/24 0959   01/29/24 1000  cefTRIAXone (ROCEPHIN) 2 g in sodium chloride 0.9 % 100 mL IVPB        2 g 200 mL/hr over 30 Minutes Intravenous Every 24 hours 01/28/24 1343     01/28/24 1000  cefTRIAXone (ROCEPHIN) 2 g in sodium chloride 0.9 % 100 mL IVPB        2 g 200 mL/hr over 30 Minutes Intravenous Once 01/28/24 0954 01/28/24 1045   01/28/24 1000  azithromycin (ZITHROMAX) 500 mg in sodium chloride 0.9 % 250 mL IVPB  500 mg 250 mL/hr over 60 Minutes Intravenous  Once 01/28/24 0954 01/28/24 1135       Significant Hospital Events: Including procedures, antibiotic start and stop dates in addition to other pertinent events   5/7: Presents to ED with Acute Respiratory Distress requiring BiPAP.  With A.fib w/ RVR requiring Amiodarone bolus and developing septic shock requiring initiation of peripheral Levophed  PCCM asked to admit.  Interim History / Subjective:  As outlined above  under "Significant Hospital Events" section  Objective   Blood pressure (!) 97/58, pulse (!) 136, temperature (!) 96.9 F (36.1 C), temperature source Axillary, resp. rate 15, height 5\' 1"  (1.549 m), weight 81.6 kg, SpO2 98%.    FiO2 (%):  [30 %] 30 % Pressure Support:  [5 cmH20] 5 cmH20   Intake/Output Summary (Last 24 hours) at 01/28/2024 1408 Last data filed at 01/28/2024 1353 Gross per 24 hour  Intake 2850 ml  Output --  Net 2850 ml   Filed Weights   01/28/24 0921  Weight: 81.6 kg    Examination: General: Acute on chronically ill-appearing obese female, laying in bed, on BiPAP, with mild respiratory distress HENT: Atraumatic, normocephalic, neck supple, no JVD Lungs: Coarse breath sounds with mild expiratory wheezing throughout, BiPAP assisted, tachypnea with abdominal accessory muscle use Cardiovascular: Tachycardia, irregular irregular rhythm, no murmurs, rubs, gallops Abdomen: Obese, soft, nontender, no guarding or rebound tenderness, bowel sounds positive x 4 Extremities: Generalized weakness, chronic trophic changes to bilateral lower extremities Neuro: Sleeping, arouses easily to voice, difficult to assess orientation due to extreme hard of hearing, moves all extremities, no focal deficits noted GU: Deferred  Resolved Hospital Problem list     Assessment & Plan:   #Shock: Septic +/- Cardiogenic #Atrial Fibrillation with RVR #Concern for Acute Decompensated CHF (currently undifferentiated pending Echo) PMHx: HTN, palpitations -Continuous cardiac monitoring -Maintain MAP >65 -Cautious IV fluids -Vasopressors as needed to maintain MAP goal -Trend lactic acid until normalized -Trend HS Troponin until peaked -Check Coox panel once central access obtained -Echocardiogram pending -Diuresis as BP and renal function permits ~ holding for now due to shock -Amiodarone bolus x1 -Will need to consider Heparin gtt for [facility] -Check TSH and thyroid panel -Low threshold for  Cardiology consultation  #Meets SIRS Criteria (HR 130's, RR 30's, WBC 22.8) #Severe Sepsis due to ... #Community Acquired Pneumonia CTa Abdomen & Pelvis with diffuse fatty liver changes -Monitor fever curve -Trend WBC's & Procalcitonin -Follow cultures as above -Continue empiric Azithromycin and Ceftriaxone pending cultures & sensitivities -Obtain CT Abdomen & Pelvis due to worsening lactic acidosis and distended abdomen (abdominal exam currently not concerning for acute abdomen as exam without rebound tenderness or guarding)  #Acute Hypoxic Respiratory Failure due to ... #Community Acquired Pneumonia #Acute COPD Exacerbation #Concern for Acute Decompensated CHF PMHx: COPD CTa Chest negative for PE, with medium to large bilateral pleural effusions with basilar infiltrates and atelectasis (edema vs superimposed pneumonia) -Supplemental O2 as needed to maintain O2 sats 88 to 92% -BiPAP, wean as tolerated (HIGH RISK FOR INTUBATION) -Follow intermittent Chest X-ray & ABG as needed -Bronchodilators & Pulmicort nebs -IV Steroids -ABX as above -Diuresis as BP and renal function permits ~ holding for now due to shock -Pulmonary toilet as able  #Diabetes Mellitus  -CBG's q4h; Target range of 140 to 180 -SSI -Follow ICU Hypo/Hyperglycemia protocol  #Acute Metabolic Encephalopathy PMHx: HOH -Treatment of Sepsis and metabolic derangements as outlined above -Provide supportive care -Promote normal sleep/wake cycle and family presence -Avoid sedating  medications as able     Pt is critically ill with multiorgan failure. Prognosis is guarded, high risk for further decompensation, cardiac arrest, and death.  Given current critical illness superimposed on multiple chronic co-morbidities and advanced age, overall long term prognosis is poor.  Recommend consideration of DNR/DNI status.  Will consult Palliative Care to assist with GOC discussions.   Best Practice (right click and "Reselect all  SmartList Selections" daily)   Diet/type: NPO DVT prophylaxis: prophylactic heparin  GI prophylaxis: N/A Lines: N/A Foley:  N/A Code Status:  full code Last date of multidisciplinary goals of care discussion [N/a]  5/7: Pt and family (daughter-in-law) updated at bedside on plan of care.  Labs   CBC: Recent Labs  Lab 01/28/24 0914  WBC 22.8*  NEUTROABS 16.8*  HGB 13.1  HCT 42.7  MCV 87.5  PLT 495*    Basic Metabolic Panel: Recent Labs  Lab 01/28/24 0914  NA 134*  K 3.8  CL 98  CO2 22  GLUCOSE 256*  BUN 11  CREATININE 1.11*  CALCIUM 8.8*   GFR: Estimated Creatinine Clearance: 38.5 mL/min (A) (by C-G formula based on SCr of 1.11 mg/dL (H)). Recent Labs  Lab 01/28/24 0914  WBC 22.8*  LATICACIDVEN 4.8*    Liver Function Tests: Recent Labs  Lab 01/28/24 0914  AST 28  ALT 20  ALKPHOS 29*  BILITOT 0.6  PROT 7.6  ALBUMIN 4.0   No results for input(s): "LIPASE", "AMYLASE" in the last 168 hours. No results for input(s): "AMMONIA" in the last 168 hours.  ABG    Component Value Date/Time   HCO3 21.9 01/28/2024 0919   ACIDBASEDEF 5.9 (H) 01/28/2024 0919   O2SAT 84.6 01/28/2024 0919     Coagulation Profile: Recent Labs  Lab 01/28/24 0914  INR 1.0    Cardiac Enzymes: No results for input(s): "CKTOTAL", "CKMB", "CKMBINDEX", "TROPONINI" in the last 168 hours.  HbA1C: HbA1c POC (<> result, manual entry)  Date/Time Value Ref Range Status  04/03/2021 03:07 PM 7.1 4.0 - 5.6 % Final    CBG: No results for input(s): "GLUCAP" in the last 168 hours.  Review of Systems:   Unable to assess due to BiPAP/Respiratory Distress/Extremely [facility]   Past Medical History:  She,  has a past medical history of Cancer (HCC), CHF (congestive heart failure) (HCC), Diabetes mellitus without complication (HCC), HOH (hard of hearing), Hypertension, and Palpitations.   Surgical History:   Past Surgical History:  Procedure Laterality Date   ABDOMINAL HYSTERECTOMY   1990   CATARACT EXTRACTION W/PHACO Right 12/20/2020   Procedure: CATARACT EXTRACTION PHACO AND INTRAOCULAR LENS PLACEMENT (IOC) RIGHT DIABETIC 8.13 01:17.6 10.5%;  Surgeon: Annell Kidney, MD;  Location: [facility];  Service: Ophthalmology;  Laterality: Right;   CATARACT EXTRACTION W/PHACO Left 01/03/2021   Procedure: CATARACT EXTRACTION PHACO AND INTRAOCULAR LENS PLACEMENT (IOC) LEFT DIABETIC  7.37 01:15.6 9.8%;  Surgeon: Annell Kidney, MD;  Location: [facility];  Service: Ophthalmology;  Laterality: Left;   CHOLECYSTECTOMY     DILATION AND CURETTAGE OF UTERUS  before 1990   X2   NECK SURGERY  03/30/2008   Per family, pt broke her neck   STOMACH SURGERY     tumor removed, per family     Social History:   reports that she has never smoked. She has never used smokeless tobacco. She reports that she does not drink alcohol and does not use drugs.   Family History:  Her family history is not on file.  Allergies Allergies  Allergen Reactions   Penicillins Rash     Home Medications  Prior to Admission medications   Medication Sig Start Date End Date Taking? Authorizing Provider  aspirin EC 81 MG tablet Take 1 tablet by mouth daily.    [provider]  Calcium Carbonate-Vit D-Min (CALCIUM 1200 PO) Take by mouth.    [provider]  furosemide  (LASIX ) 40 MG tablet TAKE 1 TABLET BY MOUTH EVERY DAY 08/27/21   Theron Flavin, MD  glipiZIDE  (GLUCOTROL  XL) 5 MG 24 hr tablet Take 3 tablets (15 mg total) by mouth daily. 11/13/21   Masoud, Javed, MD  levothyroxine (SYNTHROID, LEVOTHROID) 88 MCG tablet Take 88 mcg by mouth every morning. 12/09/17   [provider]  loratadine  (CLARITIN ) 10 MG tablet TAKE 1 TABLET BY MOUTH EVERY DAY 09/24/22   Theron Flavin, MD  losartan  (COZAAR ) 50 MG tablet TAKE 1 TABLET BY MOUTH EVERY DAY 06/10/22   Masoud, Javed, MD  lovastatin (MEVACOR) 10 MG tablet TAKE 1 TABLET ONCE A DAY ORALLY 10/20/17   [provider]  metFORMIN  (GLUCOPHAGE ) 1000 MG tablet TAKE 1 TABLET BY MOUTH TWICE A DAY 12/20/20   Theron Flavin, MD  metoprolol  succinate (TOPROL -XL) 50 MG 24 hr tablet TAKE 1 TABLET BY MOUTH DAILY. TAKE WITH OR IMMEDIATELY FOLLOWING A MEAL. 08/23/22   Theron Flavin, MD  Omega-3 Fatty Acids (FISH OIL) 500 MG CAPS Take 1,000 mg by mouth daily.    [provider]  omeprazole  (PRILOSEC) 20 MG capsule TAKE 1 CAPSULE BY MOUTH EVERY DAY 06/04/21   Theron Flavin, MD     Critical care time: 60 minutes     Cherylann Corpus, AGACNP-BC [place] [facility] Prefer epic messenger for cross cover needs If after hours, please call E-link

## 2024-01-28 NOTE — ED Notes (Signed)
 Fluid boluses are still running. EDP at bedside talking to son and daughter in law.

## 2024-01-28 NOTE — Consult Note (Signed)
 Initial Consultation Note   Patient: Tanya Frye XBM:[id number] DOB: 11/28/1940 PCP: Pcp, No DOA: 01/28/2024 DOS: the patient was seen and examined on 01/28/2024 Primary service: Lind Repine, MD  Referring physician: Dr. Demetrios Finders Reason for consult: Respiratory Failure   Assessment/Plan:  # Acute hypoxic respiratory failure # Septic shock # Community-acquired pneumonia # New onset atrial fibrillation with RVR # AKI   Patient is presenting with multiorgan dysfunction in the setting of underlying pneumonia.  Given worsening hypotension despite IV fluid resuscitation, difficulty tolerating BiPAP, and high risk for flash pulmonary edema, I have discussed ICU consultation with the EDP.  - Transferred to ICU  TRH will sign off at present, please call us  again when needed.  HPI: Tanya Frye is a 83 y.o. female with past medical history of Type 2 diabetes, hypertension who presents to the ED due to respiratory distress.   History provided by patient's daughter-in-law at bedside.  She states that yesterday, Tanya Frye was complaining of a productive cough and not feeling well.  Then she called her today, complaining of severe shortness of breath and the ambulance was called.  She is not sure of any other new symptoms in the last few days, but notes that patient has been complaining of increasing abdominal distention and weight gain over the last couple months.  ED course: On arrival to the ED, patient was initially normotensive at 140/126 but subsequently became hypotensive as low as 68/57.  She was tachycardic at 141 with atrial fibrillation.  She was hypothermic at 96.9.  She was saturating at 96% on BiPAP.  Initial workup notable for WBC of 22.8, lactic acid 4.8, BNP 251, glucose 256, creat 1.11 and GFR 50.  COVID-19, influenza and RSV PCR negative.  Chest x-ray with multifocal opacities and bilateral pleural effusions.  Patient started on IV fluids, azithromycin, Rocephin.  TRH contacted  for admission.  Review of Systems: unable to review all systems due to the inability of the patient to answer questions. 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   CHF (congestive heart failure) (HCC)    Diabetes mellitus without complication (HCC)    HOH (hard of hearing)    extremely HOH   Hypertension    Palpitations    occasional related to meds/heart races   Past Surgical History:  Procedure Laterality Date   ABDOMINAL HYSTERECTOMY  1990   CATARACT EXTRACTION W/PHACO Right 12/20/2020   Procedure: CATARACT EXTRACTION PHACO AND INTRAOCULAR LENS PLACEMENT (IOC) RIGHT DIABETIC 8.13 01:17.6 10.5%;  Surgeon: Annell Kidney, MD;  Location: [facility];  Service: Ophthalmology;  Laterality: Right;   CATARACT EXTRACTION W/PHACO Left 01/03/2021   Procedure: CATARACT EXTRACTION PHACO AND INTRAOCULAR LENS PLACEMENT (IOC) LEFT DIABETIC  7.37 01:15.6 9.8%;  Surgeon: Annell Kidney, MD;  Location: [facility];  Service: Ophthalmology;  Laterality: Left;   CHOLECYSTECTOMY     DILATION AND CURETTAGE OF UTERUS  before 1990   X2   NECK SURGERY  03/30/2008   Per family, pt broke her neck   STOMACH SURGERY     tumor removed, per family   Social History:  reports that she has never smoked. She has never used smokeless tobacco. She reports that she does not drink alcohol and does not use drugs.  Allergies  Allergen Reactions   Penicillins Rash    History reviewed. No pertinent family history.  Prior to Admission medications   Medication Sig Start Date End  Date Taking? Authorizing Provider  aspirin EC 81 MG tablet Take 1 tablet by mouth daily.    [provider]  Calcium Carbonate-Vit D-Min (CALCIUM 1200 PO) Take by mouth.    [provider]  furosemide  (LASIX ) 40 MG tablet TAKE 1 TABLET BY MOUTH EVERY DAY 08/27/21   Theron Flavin, MD  glipiZIDE  (GLUCOTROL  XL) 5 MG 24 hr tablet Take 3 tablets (15 mg total) by  mouth daily. 11/13/21   Masoud, Javed, MD  levothyroxine (SYNTHROID, LEVOTHROID) 88 MCG tablet Take 88 mcg by mouth every morning. 12/09/17   [provider]  loratadine  (CLARITIN ) 10 MG tablet TAKE 1 TABLET BY MOUTH EVERY DAY 09/24/22   Theron Flavin, MD  losartan  (COZAAR ) 50 MG tablet TAKE 1 TABLET BY MOUTH EVERY DAY 06/10/22   Masoud, Javed, MD  lovastatin (MEVACOR) 10 MG tablet TAKE 1 TABLET ONCE A DAY ORALLY 10/20/17   [provider]  metFORMIN  (GLUCOPHAGE ) 1000 MG tablet TAKE 1 TABLET BY MOUTH TWICE A DAY 12/20/20   Theron Flavin, MD  metoprolol  succinate (TOPROL -XL) 50 MG 24 hr tablet TAKE 1 TABLET BY MOUTH DAILY. TAKE WITH OR IMMEDIATELY FOLLOWING A MEAL. 08/23/22   Theron Flavin, MD  Omega-3 Fatty Acids (FISH OIL) 500 MG CAPS Take 1,000 mg by mouth daily.    [provider]  omeprazole  (PRILOSEC) 20 MG capsule TAKE 1 CAPSULE BY MOUTH EVERY DAY 06/04/21   Theron Flavin, MD    Physical Exam: Vitals:   01/28/24 1000 01/28/24 1030 01/28/24 1100 01/28/24 1230  BP: (!) 140/126 111/70 104/72 (!) 97/58  Pulse: (!) 134 (!) 141 89 (!) 136  Resp: (!) 27 (!) 25 (!) 23 15  Temp:      TempSrc:      SpO2: 96% 95% 95% 98%  Weight:      Height:       Physical Exam Vitals and nursing note reviewed.  Constitutional:      Appearance: She is ill-appearing and toxic-appearing.  HENT:     Head: Normocephalic and atraumatic.     Mouth/Throat:     Mouth: Mucous membranes are dry.  Eyes:     Extraocular Movements: Extraocular movements intact.     Pupils: Pupils are equal, round, and reactive to light.  Cardiovascular:     Rate and Rhythm: Tachycardia present. Rhythm irregular.     Heart sounds: No murmur heard.    Comments: Trace bile lateral pitting edema Pulmonary:     Effort: Tachypnea and accessory muscle usage present.     Breath sounds: Decreased breath sounds present.  Abdominal:     General: Bowel sounds are decreased. There is distension.     Tenderness: There  is no abdominal tenderness. There is no guarding.  Skin:    General: Skin is warm and dry.  Neurological:     Comments: Patient awakes easily, but falls back asleep.  Very hard of hearing.    Data Reviewed:  CBC with WBC of 22.8, hemoglobin of 13.1, platelets of 495 CMP with sodium of 134, potassium 3.8, bicarb 22, glucose 256, BUN 11, creatinine 1.11, AST 20, ALT 20, GFR 50 BNP 251 Troponin 8 Lactic acid 4.8 and then above 9  COVID-19, influenza and RSV PCR negative  DG Chest Port 1 View Result Date: 01/28/2024 CLINICAL DATA:  Sepsis. EXAM: PORTABLE CHEST 1 VIEW COMPARISON:  Chest radiograph dated 01/03/2018. FINDINGS: There is mild cardiomegaly and vascular congestion. Small bilateral pleural effusions and bibasilar atelectasis or infiltrate. No pneumothorax.  Evaluation however is very limited due to superimposition of the patient's mandible over the upper chest. No acute osseous pathology. IMPRESSION: 1. Mild cardiomegaly and vascular congestion. 2. Small bilateral pleural effusions and bibasilar atelectasis or infiltrate. Electronically Signed   By: Angus Bark M.D.   On: 01/28/2024 10:31   Telemetry personally reviewed.  Consistent with atrial fibrillation with RVR.  Results are pending, will review when available.   Family Communication: Patient's daughter-in-law updated at bedside Primary team communication: Discussed with primary team Thank you very much for involving us  in the care of your patient.  Author: Avi Body, MD 01/28/2024 2:06 PM  For on call review [email or web address].

## 2024-01-28 NOTE — Procedures (Signed)
 Intubation Procedure Note  Tanya Frye  [id number]  1941/02/11  Date:01/28/24  Time:7:16 PM   Provider Performing:Dillan Candela D [name]    Procedure: Intubation (31500)  Indication(s) Respiratory Failure  Consent Risks of the procedure as well as the alternatives and risks of each were explained to the patient and/or caregiver.  Consent for the procedure was obtained and is signed in the bedside chart   Anesthesia Versed , Rocuronium, and Ketamine   Time Out Verified patient identification, verified procedure, site/side was marked, verified correct patient position, special equipment/implants available, medications/allergies/relevant history reviewed, required imaging and test results available.   Sterile Technique Usual hand hygeine, masks, and gloves were used   Procedure Description Patient positioned in bed supine.  Sedation given as noted above.  Patient was intubated with endotracheal tube using Glidescope.  View was Grade 1 full glottis .  Number of attempts was 1.  Colorimetric CO2 detector was consistent with tracheal placement.   Complications/Tolerance None; patient tolerated the procedure well. Chest X-ray is ordered to verify placement.   EBL Minimal   Specimen(s) None   Size 7.5 ETT Tube secured at 22 cm at the lip    Cherylann Corpus, AGACNP-BC [place] [facility] Prefer epic messenger for cross cover needs If after hours, please call E-link

## 2024-01-29 ENCOUNTER — Inpatient Hospital Stay

## 2024-01-29 ENCOUNTER — Inpatient Hospital Stay (HOSPITAL_BASED_OUTPATIENT_CLINIC_OR_DEPARTMENT_OTHER)
Admit: 2024-01-29 | Discharge: 2024-01-29 | Disposition: A | Discharge status: Home / Self Care | Attending: Pulmonary Disease | Admitting: Pulmonary Disease

## 2024-01-29 DIAGNOSIS — Z515 Encounter for palliative care: Secondary | ICD-10-CM | POA: Diagnosis not present

## 2024-01-29 DIAGNOSIS — I5041 Acute combined systolic (congestive) and diastolic (congestive) heart failure: Secondary | ICD-10-CM

## 2024-01-29 DIAGNOSIS — J189 Pneumonia, unspecified organism: Secondary | ICD-10-CM | POA: Diagnosis present

## 2024-01-29 DIAGNOSIS — R0603 Acute respiratory distress: Secondary | ICD-10-CM

## 2024-01-29 DIAGNOSIS — R570 Cardiogenic shock: Secondary | ICD-10-CM | POA: Diagnosis present

## 2024-01-29 DIAGNOSIS — I420 Dilated cardiomyopathy: Secondary | ICD-10-CM | POA: Diagnosis present

## 2024-01-29 DIAGNOSIS — R6521 Severe sepsis with septic shock: Secondary | ICD-10-CM | POA: Diagnosis not present

## 2024-01-29 DIAGNOSIS — J9600 Acute respiratory failure, unspecified whether with hypoxia or hypercapnia: Secondary | ICD-10-CM | POA: Diagnosis not present

## 2024-01-29 DIAGNOSIS — J9601 Acute respiratory failure with hypoxia: Secondary | ICD-10-CM | POA: Diagnosis not present

## 2024-01-29 DIAGNOSIS — A419 Sepsis, unspecified organism: Secondary | ICD-10-CM | POA: Diagnosis present

## 2024-01-29 DIAGNOSIS — I214 Non-ST elevation (NSTEMI) myocardial infarction: Secondary | ICD-10-CM | POA: Diagnosis present

## 2024-01-29 LAB — ECHOCARDIOGRAM COMPLETE
Height: 64 in
S' Lateral: 4.15 cm
Weight: 3241.64 [oz_av]

## 2024-01-29 LAB — BLOOD GAS, ARTERIAL
Acid-Base Excess: 3.1 mmol/L — ABNORMAL HIGH (ref 0.0–2.0)
Acid-Base Excess: 3.6 mmol/L — ABNORMAL HIGH (ref 0.0–2.0)
Acid-Base Excess: 0.8 mmol/L (ref 0.0–2.0)
Acid-base deficit: 11.8 mmol/L — ABNORMAL HIGH (ref 0.0–2.0)
Acid-base deficit: 16.6 mmol/L — ABNORMAL HIGH (ref 0.0–2.0)
Bicarbonate: 11.1 mmol/L — ABNORMAL LOW (ref 20.0–28.0)
Bicarbonate: 15.1 mmol/L — ABNORMAL LOW (ref 20.0–28.0)
Bicarbonate: 24.2 mmol/L (ref 20.0–28.0)
Bicarbonate: 24.4 mmol/L (ref 20.0–28.0)
Bicarbonate: 24.8 mmol/L (ref 20.0–28.0)
FIO2: 30 %
FIO2: 30 %
FIO2: 40 %
FIO2: 40 %
FIO2: 40 %
MECHVT: 450 mL
MECHVT: 450 mL
MECHVT: 450 mL
MECHVT: 450 mL
MECHVT: 450 mL
Mechanical Rate: 20
Mechanical Rate: 24
Mechanical Rate: 28
Mechanical Rate: 28
Mechanical Rate: 28
O2 Saturation: 96.2 %
O2 Saturation: 99 %
O2 Saturation: 99.1 %
O2 Saturation: 99.4 %
O2 Saturation: 99.5 %
PEEP: 5 cmH2O
PEEP: 5 cmH2O
PEEP: 5 cmH2O
PEEP: 5 cmH2O
PEEP: 5 cmH2O
Patient temperature: 37
Patient temperature: 37
Patient temperature: 37
Patient temperature: 37
Patient temperature: 37
pCO2 arterial: 26 mmHg — ABNORMAL LOW (ref 32–48)
pCO2 arterial: 29 mmHg — ABNORMAL LOW (ref 32–48)
pCO2 arterial: 32 mmHg (ref 32–48)
pCO2 arterial: 34 mmHg (ref 32–48)
pCO2 arterial: 37 mmHg (ref 32–48)
pH, Arterial: 7.15 — CL (ref 7.35–7.45)
pH, Arterial: 7.22 — ABNORMAL LOW (ref 7.35–7.45)
pH, Arterial: 7.46 — ABNORMAL HIGH (ref 7.35–7.45)
pH, Arterial: 7.54 — ABNORMAL HIGH (ref 7.35–7.45)
pH, Arterial: 7.58 — ABNORMAL HIGH (ref 7.35–7.45)
pO2, Arterial: 114 mmHg — ABNORMAL HIGH (ref 83–108)
pO2, Arterial: 120 mmHg — ABNORMAL HIGH (ref 83–108)
pO2, Arterial: 122 mmHg — ABNORMAL HIGH (ref 83–108)
pO2, Arterial: 71 mmHg — ABNORMAL LOW (ref 83–108)
pO2, Arterial: 80 mmHg — ABNORMAL LOW (ref 83–108)

## 2024-01-29 LAB — LACTIC ACID, PLASMA
Lactic Acid, Venous: 2.1 mmol/L (ref 0.5–1.9)
Lactic Acid, Venous: 3.8 mmol/L (ref 0.5–1.9)
Lactic Acid, Venous: 5.5 mmol/L (ref 0.5–1.9)
Lactic Acid, Venous: 7.6 mmol/L (ref 0.5–1.9)
Lactic Acid, Venous: >9 mmol/L (ref 0.5–1.9)

## 2024-01-29 LAB — RESPIRATORY PANEL BY PCR

## 2024-01-29 LAB — TROPONIN I (HIGH SENSITIVITY)
Troponin I (High Sensitivity): 1032 ng/L (ref <18)
Troponin I (High Sensitivity): 311 ng/L (ref <18)
Troponin I (High Sensitivity): 539 ng/L (ref <18)
Troponin I (High Sensitivity): 685 ng/L (ref <18)
Troponin I (High Sensitivity): 826 ng/L (ref <18)
Troponin I (High Sensitivity): 855 ng/L (ref <18)
Troponin I (High Sensitivity): 996 ng/L (ref <18)

## 2024-01-29 LAB — RENAL FUNCTION PANEL
Albumin: 3.1 g/dL — ABNORMAL LOW (ref 3.5–5.0)
Anion gap: 23 — ABNORMAL HIGH (ref 5–15)
BUN: 17 mg/dL (ref 8–23)
CO2: 17 mmol/L — ABNORMAL LOW (ref 22–32)
Calcium: 7.8 mg/dL — ABNORMAL LOW (ref 8.9–10.3)
Chloride: 96 mmol/L — ABNORMAL LOW (ref 98–111)
Creatinine, Ser: 1.47 mg/dL — ABNORMAL HIGH (ref 0.44–1.00)
GFR, Estimated: 36 mL/min — ABNORMAL LOW (ref >60)
Glucose, Bld: 342 mg/dL — ABNORMAL HIGH (ref 70–99)
Phosphorus: 5.6 mg/dL — ABNORMAL HIGH (ref 2.5–4.6)
Potassium: 4.1 mmol/L (ref 3.5–5.1)
Sodium: 136 mmol/L (ref 135–145)

## 2024-01-29 LAB — CBC
HCT: 39.6 % (ref 36.0–46.0)
Hemoglobin: 12.3 g/dL (ref 12.0–15.0)
MCH: 26.6 pg (ref 26.0–34.0)
MCHC: 31.1 g/dL (ref 30.0–36.0)
MCV: 85.7 fL (ref 80.0–100.0)
Platelets: 421 10*3/uL — ABNORMAL HIGH (ref 150–400)
RBC: 4.62 MIL/uL (ref 3.87–5.11)
RDW: 15.9 % — ABNORMAL HIGH (ref 11.5–15.5)
WBC: 28.1 10*3/uL — ABNORMAL HIGH (ref 4.0–10.5)
nRBC: 0 % (ref 0.0–0.2)

## 2024-01-29 LAB — GLUCOSE, CAPILLARY
Glucose-Capillary: 356 mg/dL — ABNORMAL HIGH (ref 70–99)
Glucose-Capillary: 330 mg/dL — ABNORMAL HIGH (ref 70–99)
Glucose-Capillary: 293 mg/dL — ABNORMAL HIGH (ref 70–99)
Glucose-Capillary: 240 mg/dL — ABNORMAL HIGH (ref 70–99)
Glucose-Capillary: 260 mg/dL — ABNORMAL HIGH (ref 70–99)
Glucose-Capillary: 176 mg/dL — ABNORMAL HIGH (ref 70–99)
Glucose-Capillary: 165 mg/dL — ABNORMAL HIGH (ref 70–99)
Glucose-Capillary: 143 mg/dL — ABNORMAL HIGH (ref 70–99)
Glucose-Capillary: 149 mg/dL — ABNORMAL HIGH (ref 70–99)
Glucose-Capillary: 134 mg/dL — ABNORMAL HIGH (ref 70–99)
Glucose-Capillary: 143 mg/dL — ABNORMAL HIGH (ref 70–99)
Glucose-Capillary: 125 mg/dL — ABNORMAL HIGH (ref 70–99)
Glucose-Capillary: 160 mg/dL — ABNORMAL HIGH (ref 70–99)
Glucose-Capillary: 288 mg/dL — ABNORMAL HIGH (ref 70–99)

## 2024-01-29 LAB — HEPATIC FUNCTION PANEL
ALT: 38 U/L (ref 0–44)
AST: 51 U/L — ABNORMAL HIGH (ref 15–41)
Albumin: 3.1 g/dL — ABNORMAL LOW (ref 3.5–5.0)
Alkaline Phosphatase: 24 U/L — ABNORMAL LOW (ref 38–126)
Bilirubin, Direct: 0.3 mg/dL — ABNORMAL HIGH (ref 0.0–0.2)
Indirect Bilirubin: 0.6 mg/dL (ref 0.3–0.9)
Total Bilirubin: 0.9 mg/dL (ref 0.0–1.2)
Total Protein: 6.1 g/dL — ABNORMAL LOW (ref 6.5–8.1)

## 2024-01-29 LAB — COOXEMETRY PANEL
Carboxyhemoglobin: 1.1 % (ref 0.5–1.5)
Methemoglobin: <0.7 % (ref 0.0–1.5)
O2 Saturation: 60 %
Total hemoglobin: 11.8 g/dL — ABNORMAL LOW (ref 12.0–16.0)
Total oxygen content: 59.3 %

## 2024-01-29 LAB — HEPARIN LEVEL (UNFRACTIONATED)
Heparin Unfractionated: 0.64 [IU]/mL (ref 0.30–0.70)
Heparin Unfractionated: 0.61 [IU]/mL (ref 0.30–0.70)

## 2024-01-29 LAB — MAGNESIUM: Magnesium: 2 mg/dL (ref 1.7–2.4)

## 2024-01-29 LAB — HEMOGLOBIN A1C
Hgb A1c MFr Bld: 6.6 % — ABNORMAL HIGH (ref 4.8–5.6)
Mean Plasma Glucose: 142.72 mg/dL

## 2024-01-29 MED ORDER — CHLORHEXIDINE GLUCONATE CLOTH 2 % EX PADS
6.0000 | MEDICATED_PAD | Freq: Every day | CUTANEOUS | Status: DC
  Administered 2024-01-29 – 2024-02-10 (×13): 6 via TOPICAL

## 2024-01-29 MED ORDER — FREE WATER
30.0000 mL | Status: DC
  Administered 2024-01-29 – 2024-02-06 (×47): 30 mL

## 2024-01-29 MED ORDER — INSULIN ASPART 100 UNIT/ML IJ SOLN: 7.0000 [IU] | Freq: Once | INTRAMUSCULAR | Status: DC

## 2024-01-29 MED ORDER — DEXTROSE 10 % IV SOLN: INTRAVENOUS | Status: DC | PRN

## 2024-01-29 MED ORDER — INSULIN GLARGINE-YFGN 100 UNIT/ML ~~LOC~~ SOLN
10.0000 [IU] | Freq: Every day | SUBCUTANEOUS | Status: DC
  Administered 2024-01-29: 10 [IU] via SUBCUTANEOUS
  Filled 2024-01-29: qty 0.1

## 2024-01-29 MED ORDER — SODIUM CHLORIDE 0.9 % IV SOLN
0.5000 ug/min | INTRAVENOUS | Status: DC
  Administered 2024-01-29: 1.5 ug/min via INTRAVENOUS
  Filled 2024-01-29 (×3): qty 10

## 2024-01-29 MED ORDER — SODIUM CHLORIDE 0.9 % IV SOLN
2.0000 g | Freq: Two times a day (BID) | INTRAVENOUS | Status: DC
  Administered 2024-01-29 – 2024-02-01 (×6): 2 g via INTRAVENOUS
  Filled 2024-01-29 (×7): qty 12.5

## 2024-01-29 MED ORDER — ASPIRIN 81 MG PO CHEW
81.0000 mg | CHEWABLE_TABLET | Freq: Every day | ORAL | Status: DC
  Administered 2024-01-29 – 2024-02-07 (×10): 81 mg
  Filled 2024-01-29 (×11): qty 1

## 2024-01-29 MED ORDER — INSULIN REGULAR(HUMAN) IN NACL 100-0.9 UT/100ML-% IV SOLN
INTRAVENOUS | Status: DC
  Administered 2024-01-29: 3.6 [IU]/h via INTRAVENOUS
  Administered 2024-01-29: 17 [IU]/h via INTRAVENOUS
  Filled 2024-01-29 (×2): qty 100

## 2024-01-29 MED ORDER — INSULIN ASPART 100 UNIT/ML IJ SOLN
2.0000 [IU] | INTRAMUSCULAR | Status: DC
  Administered 2024-01-29: 4 [IU] via SUBCUTANEOUS
  Filled 2024-01-29: qty 1

## 2024-01-29 MED ORDER — VITAL HIGH PROTEIN PO LIQD
1000.0000 mL | ORAL | Status: DC
  Administered 2024-01-29 – 2024-02-02 (×5): 1000 mL

## 2024-01-29 MED ORDER — MIDAZOLAM BOLUS VIA INFUSION
1.0000 mg | INTRAVENOUS | Status: DC | PRN
  Administered 2024-01-30: 1 mg via INTRAVENOUS
  Administered 2024-01-30 (×2): 2 mg via INTRAVENOUS

## 2024-01-29 MED ORDER — DOBUTAMINE-DEXTROSE 4-5 MG/ML-% IV SOLN
5.0000 ug/kg/min | INTRAVENOUS | Status: DC
  Administered 2024-01-29: 2.5 ug/kg/min via INTRAVENOUS
  Administered 2024-01-31: 7.5 ug/kg/min via INTRAVENOUS
  Administered 2024-02-01: 10 ug/kg/min via INTRAVENOUS
  Administered 2024-02-01: 12.5 ug/kg/min via INTRAVENOUS
  Administered 2024-02-02: 5 ug/kg/min via INTRAVENOUS
  Filled 2024-01-29 (×5): qty 250

## 2024-01-29 MED ORDER — INSULIN ASPART 100 UNIT/ML IJ SOLN
0.0000 [IU] | INTRAMUSCULAR | Status: DC
  Administered 2024-01-29: 11 [IU] via SUBCUTANEOUS
  Administered 2024-01-30: 4 [IU] via SUBCUTANEOUS
  Administered 2024-01-30: 11 [IU] via SUBCUTANEOUS
  Administered 2024-01-30: 7 [IU] via SUBCUTANEOUS
  Administered 2024-01-30: 11 [IU] via SUBCUTANEOUS
  Administered 2024-01-30 – 2024-01-31 (×4): 7 [IU] via SUBCUTANEOUS
  Administered 2024-01-31 (×2): 4 [IU] via SUBCUTANEOUS
  Administered 2024-01-31: 7 [IU] via SUBCUTANEOUS
  Administered 2024-02-01 (×3): 3 [IU] via SUBCUTANEOUS
  Administered 2024-02-01: 7 [IU] via SUBCUTANEOUS
  Administered 2024-02-01: 4 [IU] via SUBCUTANEOUS
  Administered 2024-02-01: 7 [IU] via SUBCUTANEOUS
  Administered 2024-02-02 (×2): 3 [IU] via SUBCUTANEOUS
  Administered 2024-02-02 (×4): 4 [IU] via SUBCUTANEOUS
  Administered 2024-02-02: 3 [IU] via SUBCUTANEOUS
  Administered 2024-02-03 – 2024-02-04 (×6): 4 [IU] via SUBCUTANEOUS
  Administered 2024-02-04: 3 [IU] via SUBCUTANEOUS
  Administered 2024-02-04 – 2024-02-05 (×10): 4 [IU] via SUBCUTANEOUS
  Administered 2024-02-06 (×2): 7 [IU] via SUBCUTANEOUS
  Administered 2024-02-06: 4 [IU] via SUBCUTANEOUS
  Administered 2024-02-06 – 2024-02-07 (×3): 7 [IU] via SUBCUTANEOUS
  Administered 2024-02-07: 4 [IU] via SUBCUTANEOUS
  Administered 2024-02-07: 2 [IU] via SUBCUTANEOUS
  Administered 2024-02-07: 4 [IU] via SUBCUTANEOUS
  Administered 2024-02-07: 7 [IU] via SUBCUTANEOUS
  Administered 2024-02-07: 4 [IU] via SUBCUTANEOUS
  Administered 2024-02-08 (×2): 3 [IU] via SUBCUTANEOUS
  Administered 2024-02-08: 7 [IU] via SUBCUTANEOUS
  Administered 2024-02-08 (×3): 4 [IU] via SUBCUTANEOUS
  Administered 2024-02-09 (×2): 7 [IU] via SUBCUTANEOUS
  Administered 2024-02-09 (×2): 3 [IU] via SUBCUTANEOUS
  Administered 2024-02-09: 4 [IU] via SUBCUTANEOUS
  Administered 2024-02-10: 3 [IU] via SUBCUTANEOUS
  Administered 2024-02-10: 4 [IU] via SUBCUTANEOUS
  Administered 2024-02-10: 3 [IU] via SUBCUTANEOUS
  Administered 2024-02-10 (×2): 4 [IU] via SUBCUTANEOUS
  Administered 2024-02-10: 7 [IU] via SUBCUTANEOUS
  Administered 2024-02-11: 11 [IU] via SUBCUTANEOUS
  Administered 2024-02-11: 3 [IU] via SUBCUTANEOUS
  Administered 2024-02-11: 4 [IU] via SUBCUTANEOUS
  Administered 2024-02-11: 7 [IU] via SUBCUTANEOUS
  Administered 2024-02-11: 4 [IU] via SUBCUTANEOUS
  Filled 2024-01-29 (×77): qty 1

## 2024-01-29 MED ORDER — FUROSEMIDE 10 MG/ML IJ SOLN
40.0000 mg | Freq: Once | INTRAMUSCULAR | Status: AC
  Administered 2024-01-29: 40 mg via INTRAVENOUS
  Filled 2024-01-29: qty 4

## 2024-01-29 MED ORDER — INSULIN ASPART 100 UNIT/ML IJ SOLN
7.0000 [IU] | INTRAMUSCULAR | Status: DC
  Filled 2024-01-29: qty 1

## 2024-01-29 MED ORDER — DEXTROSE 50 % IV SOLN: 0.0000 mL | INTRAVENOUS | Status: DC | PRN

## 2024-01-29 MED ORDER — MIDAZOLAM-SODIUM CHLORIDE 100-0.9 MG/100ML-% IV SOLN
0.5000 mg/h | INTRAVENOUS | Status: DC
  Administered 2024-01-29: 0.5 mg/h via INTRAVENOUS
  Administered 2024-01-30: 1.5 mg/h via INTRAVENOUS
  Filled 2024-01-29 (×2): qty 100

## 2024-01-29 MED ORDER — ADULT MULTIVITAMIN W/MINERALS CH
1.0000 | ORAL_TABLET | Freq: Every day | ORAL | Status: DC
  Administered 2024-01-30 – 2024-02-03 (×5): 1
  Filled 2024-01-29 (×6): qty 1

## 2024-01-29 NOTE — Progress Notes (Signed)
*  PRELIMINARY RESULTS* Echocardiogram 2D Echocardiogram has been performed.  Tanya Frye 01/29/2024, 12:47 PM

## 2024-01-29 NOTE — Plan of Care (Signed)
  Problem: Clinical Measurements: Goal: Respiratory complications will improve Outcome: Progressing   Problem: Nutrition: Goal: Adequate nutrition will be maintained Outcome: Progressing   Problem: Elimination: Goal: Will not experience complications related to urinary retention Outcome: Progressing   Problem: Pain Managment: Goal: General experience of comfort will improve and/or be controlled Outcome: Progressing   Problem: Skin Integrity: Goal: Risk for impaired skin integrity will decrease Outcome: Progressing   Problem: Education: Goal: Knowledge of General Education information will improve Description: Including pain rating scale, medication(s)/side effects and non-pharmacologic comfort measures Outcome: Not Progressing   Problem: Clinical Measurements: Goal: Ability to maintain clinical measurements within normal limits will improve Outcome: Not Progressing   Problem: Activity: Goal: Risk for activity intolerance will decrease Outcome: Not Progressing

## 2024-01-29 NOTE — Consult Note (Signed)
 PHARMACY - ANTICOAGULATION CONSULT NOTE  Pharmacy Consult for Heparin Indication: atrial fibrillation  Patient Measurements: Height: 5\' 4"  (162.6 cm) Weight: 91.9 kg (202 lb 9.6 oz) IBW/kg (Calculated) : 54.7 HEPARIN DW (KG): 74.7  Labs: Recent Labs    01/28/24 0914 01/28/24 0915 01/28/24 2138 01/28/24 2351 01/29/24 0401 01/29/24 0800 01/29/24 1140  HGB 13.1  --   --   --  12.3  --   --   HCT 42.7  --   --   --  39.6  --   --   PLT 495*  --   --   --  421*  --   --   APTT  --   --  78*  --   --   --   --   LABPROT 13.8  --   --   --   --   --   --   INR 1.0  --   --   --   --   --   --   HEPARINUNFRC  --   --   --   --  0.64  --  0.61  CREATININE 1.11*  --  1.59*  --  1.47*  --   --   TROPONINIHS  --    < > 223* 311* 539* 826*  --    < > = values in this interval not displayed.   Estimated Creatinine Clearance: 33 mL/min (A) (by C-G formula based on SCr of 1.47 mg/dL (H)).  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 female admitted with Acute Hypoxic Respiratory Failure and Severe Sepsis with Septic Shock due to Community Acquired Pneumonia, along with concern for Acute decompensated CHF and Cardiogenic shock, along with A. Fib w/ RVR now requiring intubation. Pharmacy has been consulted to initiate and dose continuous heparin infusion.  Goal of Therapy:  Heparin level 0.3-0.7 units/ml Monitor platelets by anticoagulation protocol: Yes   Plan:  --Heparin level is therapeutic x 2 --Continue heparin infusion at 1100 units/hr --Re-check HL / CBC tomorrow AM  Page Boast 01/29/2024,12:08 PM

## 2024-01-29 NOTE — Progress Notes (Addendum)
 Initial Nutrition Assessment  DOCUMENTATION CODES:   Obesity unspecified  INTERVENTION:   Vital HP @55ml /hr- Initiate at 29ml/hr, if tolerating, begin increasing by 43ml/hr q12 hours until goal rate is reached.   Free water flushes 30ml q4 hours to maintain tube patency   Regimen provides 1320kcal/day, 116g/day protein and 1284ml/day of free water.   MVI daily via tube   Pt at high refeed risk; recommend monitor potassium, magnesium and phosphorus labs daily until stable  Daily weights   NUTRITION DIAGNOSIS:   Inadequate oral intake related to inability to eat (pt sedated and ventilated) as evidenced by NPO status.  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 Fib w/ RVR and AKI.  Pt sedated and ventilated. Plan is to initiate trickle tube feeds today. Pt is likely at refeed risk. Pressor requirements decreasing. Per chart, pt appears weight stable at baseline.   Medications reviewed and include: colace, solu-cortef, protonix, miralax, azithromycin, ceftriaxone, epinephrine , heparin, insulin, levophed, vasopressin   Labs reviewed: K 4.1 wnl, creat 1.47(H), P 5.6(H), Mg 2.0 wnl Wbc- 28.1(H) Cbgs- 149, 143, 165, 176, 260, 240, 293 x 24 hrs  AIC 6.6(H)  Patient is currently intubated on ventilator support MV: 10.8 L/min Temp (24hrs), Avg:97.9 F (36.6 C), Min:97 F (36.1 C), Max:99.4 F (37.4 C)  MAP >67mmHg   UOP-   NUTRITION - FOCUSED PHYSICAL EXAM:  Flowsheet Row Most Recent Value  Orbital Region No depletion  Upper Arm Region No depletion  Thoracic and  Lumbar Region No depletion  Buccal Region No depletion  Temple Region No depletion  Clavicle Bone Region No depletion  Clavicle and Acromion Bone Region No depletion  Scapular Bone Region No depletion  Dorsal Hand No depletion  Patellar Region No depletion  Anterior Thigh Region No depletion  Posterior Calf Region No depletion  Edema (RD Assessment) Mild  Hair Reviewed  Eyes Reviewed  Mouth Reviewed  Skin Reviewed  Nails Reviewed   Diet Order:   Diet Order             Diet NPO time specified  Diet effective now                  EDUCATION NEEDS:   No education needs have been identified at this time  Skin:  Skin Assessment: Reviewed RN Assessment  Last BM:  PTA  Height:   Ht Readings from Last 1 Encounters:  01/28/24 5\' 4"  (1.626 m)    Weight:   Wt Readings from Last 1 Encounters:  01/29/24 91.9 kg    Ideal Body Weight:  54.5 kg  BMI:  Body mass index is 34.78 kg/m.  Estimated Nutritional Needs:   Kcal:  1010-1287kcal/day  Protein:  >109g/day  Fluid:  1.4-1.6L/day  Torrance Freestone MS, RD, LDN If unable to be reached, please send secure chat to "RD inpatient" available from 8:00a-4:00p daily

## 2024-01-29 NOTE — Progress Notes (Signed)
 NAME:  Tanya Frye, MRN:  [id number], DOB:  May 13, 1941, LOS: 1 ADMISSION DATE:  01/28/2024, CONSULTATION DATE:  01/28/2024 REFERRING MD:  Dr. Demetrios Finders, CHIEF COMPLAINT:  Shortness of Breath/Acute Respiratory Distress   Brief Pt Description / Synopsis:  83 y.o. female admitted with Acute Hypoxic Respiratory Failure and Severe Sepsis with Septic Shock due to Community Acquired Pneumonia, along with Acute decompensated HFrEF and Cardiogenic shock, and A. Fib w/ RVR.  Failed trial of BiPAP requiring intubation and mechanical ventilation.  History of Present Illness:  Tanya Frye is a 83 year old female with a past medical history significant for COPD, CHF,  hypertension, diabetes mellitus, and hard of hearing who presents to [facility] ED on 01/28/2024 due to shortness of breath.  Patient is currently unable to contribute to history due to acute respiratory distress, BiPAP, and being extremely hard of hearing, therefore history is obtained from chart review.  Per report she acutely developed respiratory distress earlier this morning.  Upon EMS arrival she was noted to be hypoxic with O2 saturations of 90% on room air which they placed her on CPAP en route to the hospital.  Given her respiratory distress upon arrival, she was transitioned to BiPAP.   ED Course: Initial Vital Signs: Temperature 96.9 F axillary, pulse 128, respiratory rate 33, blood pressure 146/119, SpO2 99% on BiPAP Significant Labs: Sodium 134, glucose 256, BUN 11, creatinine 1.1, BNP 251, lactic acid 4.8, high-sensitivity troponin 8, WBC 22.8 with neutrophilia VBG: pH 7.24/pCO2 51/pO2 58/bicarb 21.9 COVID-19/flu/RSV PCR is negative Imaging Chest X-ray>>IMPRESSION: 1. Mild cardiomegaly and vascular congestion. 2. Small bilateral pleural effusions and bibasilar atelectasis or infiltrate. Medications Administered: 2.5 L of LR boluses, IV azithromycin and ceftriaxone, 0.5 mg IV Ativan, 4 mg morphine, 5 mg IV Cardizem  Following fluid  resuscitation in the ED, systolic blood pressure decreasing to the 70s and 80s requiring initiation of peripheral Levophed.  PCCM asked to admit for further workup and treatment.  Please see "Significant Hospital Events" section below for full detailed hospital course.   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   CHF (congestive heart failure) (HCC)    Diabetes mellitus without complication (HCC)    HOH (hard of hearing)    extremely HOH   Hypertension    Palpitations    occasional related to meds/heart races    Micro Data:  5/7: COVID/flu/RSV PCR>> negative 5/7: RVP>>negative 5/7: Blood culture x 2>>no growth to date 5/7: Tracheal aspirate>> 5/7: MRSA PCR>>negative 5/7: Strep pneumo and Legionella urine antigens>>negative  Antimicrobials:   Anti-infectives (From admission, onward)    Start     Dose/Rate Route Frequency Ordered Stop   01/29/24 1000  azithromycin (ZITHROMAX) 500 mg in sodium chloride 0.9 % 250 mL IVPB        500 mg 250 mL/hr over 60 Minutes Intravenous Every 24 hours 01/28/24 1343 02/02/24 0959   01/29/24 1000  cefTRIAXone (ROCEPHIN) 2 g in sodium chloride 0.9 % 100 mL IVPB        2 g 200 mL/hr over 30 Minutes Intravenous Every 24 hours 01/28/24 1343     01/28/24 1000  cefTRIAXone (ROCEPHIN) 2 g in sodium chloride 0.9 % 100 mL IVPB        2 g 200 mL/hr over 30 Minutes Intravenous Once 01/28/24 0954 01/28/24 1045   01/28/24 1000  azithromycin (ZITHROMAX) 500 mg in sodium chloride 0.9 % 250 mL  IVPB        500 mg 250 mL/hr over 60 Minutes Intravenous  Once 01/28/24 0954 01/28/24 1135       Significant Hospital Events: Including procedures, antibiotic start and stop dates in addition to other pertinent events   5/7: Presents to ED with Acute Respiratory Distress requiring BiPAP.  With A.fib w/ RVR requiring Amiodarone bolus and drip, developing septic/cardiogenic shock requiring  initiation of peripheral Levophed  PCCM asked to admit.  Required intubation later in the evening. 5/8: Remains critically ill with multiorgan failure,  on vent.  Requiring 3 vasopressors (Levo, vaso, Epi).  Cardiology and Palliative Care consulted.  Metabolic and lactic acidosis improving, stop Bicarb gtt.  Leukocytosis worsened, broaden Ceftriaxone to Cefepime, cultures still pending.  Echo shows LVEF 25-30% with global hypokinesis .  Interim History / Subjective:  As outlined above under "Significant Hospital Events" section  Objective   Blood pressure 90/65, pulse 94, temperature 98.5 F (36.9 C), temperature source Axillary, resp. rate (!) 28, height 5\' 4"  (1.626 m), weight 91.9 kg, SpO2 97%.    Vent Mode: PRVC FiO2 (%):  [30 %-50 %] 30 % Set Rate:  [28 bmp] 28 bmp Vt Set:  [450 mL] 450 mL PEEP:  [5 cmH20] 5 cmH20 Pressure Support:  [5 cmH20] 5 cmH20 Plateau Pressure:  [20 cmH20] 20 cmH20   Intake/Output Summary (Last 24 hours) at 01/29/2024 0746 Last data filed at 01/29/2024 0700 Gross per 24 hour  Intake 6039.12 ml  Output 355 ml  Net 5684.12 ml   Filed Weights   01/28/24 0921 01/28/24 1545 01/29/24 0445  Weight: 81.6 kg 89.4 kg 91.9 kg    Examination: General: Critically ill-appearing obese female, laying in bed, intubated and sedated in NAD HENT: Atraumatic, normocephalic, neck supple, difficult to assess JVD due to body habitus, orally intubated Lungs: Coarse breath sounds with mild expiratory wheezing throughout, even, synchronous with the vent Cardiovascular: Tachycardia, irregular irregular rhythm, no murmurs, rubs, gallops Abdomen: Obese, soft, nontender, no guarding or rebound tenderness, bowel sounds positive x 4 Extremities: Chronic trophic changes to bilateral lower extremities Neuro: Sedated, currently does not withdraw from pain or follow commands, pupils PERRL 1 mm sluggish bilaterally GU: Foley catheter in place  Resolved Hospital Problem list      Assessment & Plan:   #Shock: Septic +/- Cardiogenic #Atrial Fibrillation with RVR #Acute Decompensated HFrEF  #Elevated Troponin in setting of demand ischemia vs NSTEMI PMHx: HTN, palpitations Echocardiogram 01/29/24: LVEF 25-30%, LV with global hypokinesis with severe hypokinesis of the basal to mid anterior/anteroseptal wall.  Diastolic parameters indeterminate. RV systolic function normal.  RV size normal.  Mild to moderate mitral regurgitation -Continuous cardiac monitoring -Maintain MAP >65 -Cautious IV fluids -Vasopressors as needed to maintain MAP goal -Trend lactic acid until normalized -Trend HS Troponin until peaked -Follow Coox panel  -Diuresis as BP and renal function permits ~ holding for now due to shock -Continue Amiodarone & Heparin drips -Consult Cardiology, appreciate input  #Meets SIRS Criteria (HR 130's, RR 30's, WBC 22.8) #Severe Sepsis due to ... #Community Acquired Pneumonia CTa Abdomen & Pelvis with diffuse fatty liver changes -Monitor fever curve -Trend WBC's & Procalcitonin -Follow cultures as above -Continue empiric Azithromycin, broaden Ceftriaxone to Cefepime pending cultures & sensitivities  #Acute Hypoxic Respiratory Failure due to ... #Community Acquired Pneumonia #Acute COPD Exacerbation #Acute Decompensated HFrEF PMHx: COPD CTa Chest negative for PE, with medium to large bilateral pleural effusions with basilar infiltrates and atelectasis (edema vs superimposed pneumonia) -Full  vent support, implement lung protective strategies -Plateau pressures less than 30 cm H20 -Wean FiO2 & PEEP as tolerated to maintain O2 sats 88 to 92% -Follow intermittent Chest X-ray & ABG as needed -Spontaneous Breathing Trials when respiratory parameters met and mental status permits -Implement VAP Bundle -Bronchodilators -IV steroids -ABX as above -Diuresis as BP and renal function permits ~ unable to diurese due to profound shock  #Acute Kidney  Injury #Anion Gap Metabolic Acidosis due to Lactic Acidosis -Monitor I&O's / urinary output -Follow BMP -Ensure adequate renal perfusion -Avoid nephrotoxic agents as able -Replace electrolytes as indicated ~ Pharmacy following for assistance with electrolyte replacement -Stop Bicarb gtt 5/8  #Diabetes Mellitus  Hgb A1c is 6.6 -CBG's q4h; Target range of 140 to 180 -Currently on insulin gtt -Follow ICU Hypo/Hyperglycemia protocol  #Acute Metabolic Encephalopathy #Sedation needs in setting of mechanical ventilation PMHx: HOH -Treatment of Sepsis and metabolic derangements as outlined above -Maintain a RASS goal of 0 to -1 -Fentanyl  and Precedex as needed to maintain RASS goal -Avoid sedating medications as able -Daily wake up assessment      Pt is critically ill with multiorgan failure. Prognosis is guarded, high risk for further decompensation, cardiac arrest, and death.  Given current critical illness superimposed on multiple chronic co-morbidities and advanced age, overall long term prognosis is poor.  Recommend consideration of DNR/DNI status.  Will consult Palliative Care to assist with GOC discussions.   Best Practice (right click and "Reselect all SmartList Selections" daily)   Diet/type: NPO DVT prophylaxis: Heparin gtt GI prophylaxis: PPI Lines: Right Femoral CVC and A-line, both still needed Foley:  yes, and is still needed Code Status:  full code Last date of multidisciplinary goals of care discussion [5/8]  5/8: Will update pt's family when they arrive at bedside on plan of care.  Labs   CBC: Recent Labs  Lab 01/28/24 0914 01/29/24 0401  WBC 22.8* 28.1*  NEUTROABS 16.8*  --   HGB 13.1 12.3  HCT 42.7 39.6  MCV 87.5 85.7  PLT 495* 421*    Basic Metabolic Panel: Recent Labs  Lab 01/28/24 0914 01/28/24 1747 01/28/24 2138 01/29/24 0401  NA 134*  --  134* 136  K 3.8  --  4.2 4.1  CL 98  --  97* 96*  CO2 22  --  12* 17*  GLUCOSE 256*  --  376*  342*  BUN 11  --  16 17  CREATININE 1.11*  --  1.59* 1.47*  CALCIUM 8.8*  --  7.9* 7.8*  MG  --  1.6*  --  2.0  PHOS  --  8.4*  --  5.6*   GFR: Estimated Creatinine Clearance: 33 mL/min (A) (by C-G formula based on SCr of 1.47 mg/dL (H)). Recent Labs  Lab 01/28/24 0914 01/28/24 1422 01/28/24 1747 01/28/24 2138 01/28/24 2351 01/29/24 0401  PROCALCITON <0.10  --   --   --   --   --   WBC 22.8*  --   --   --   --  28.1*  LATICACIDVEN 4.8*   < > >9.0* >9.0* >9.0* 7.6*   < > = values in this interval not displayed.    Liver Function Tests: Recent Labs  Lab 01/28/24 0914 01/29/24 0401  AST 28 51*  ALT 20 38  ALKPHOS 29* 24*  BILITOT 0.6 0.9  PROT 7.6 6.1*  ALBUMIN 4.0 3.1*  3.1*   No results for input(s): "LIPASE", "AMYLASE" in the last 168 hours. No  results for input(s): "AMMONIA" in the last 168 hours.  ABG    Component Value Date/Time   PHART 7.22 (L) 01/28/2024 2352   PCO2ART 37 01/28/2024 2352   PO2ART 114 (H) 01/28/2024 2352   HCO3 15.1 (L) 01/28/2024 2352   ACIDBASEDEF 11.8 (H) 01/28/2024 2352   O2SAT 99.5 01/28/2024 2352     Coagulation Profile: Recent Labs  Lab 01/28/24 0914  INR 1.0    Cardiac Enzymes: No results for input(s): "CKTOTAL", "CKMB", "CKMBINDEX", "TROPONINI" in the last 168 hours.  HbA1C: HbA1c POC (<> result, manual entry)  Date/Time Value Ref Range Status  04/03/2021 03:07 PM 7.1 4.0 - 5.6 % Final    CBG: Recent Labs  Lab 01/29/24 0405 01/29/24 0433 01/29/24 0535 01/29/24 0632 01/29/24 0733  GLUCAP 356* 330* 293* 240* 260*    Review of Systems:   Unable to assess due to intubation/sedation/critical illness   Past Medical History:  She,  has a past medical history of Cancer (HCC), CHF (congestive heart failure) (HCC), Diabetes mellitus without complication (HCC), HOH (hard of hearing), Hypertension, and Palpitations.   Surgical History:   Past Surgical History:  Procedure Laterality Date   ABDOMINAL HYSTERECTOMY   1990   CATARACT EXTRACTION W/PHACO Right 12/20/2020   Procedure: CATARACT EXTRACTION PHACO AND INTRAOCULAR LENS PLACEMENT (IOC) RIGHT DIABETIC 8.13 01:17.6 10.5%;  Surgeon: Annell Kidney, MD;  Location: [facility];  Service: Ophthalmology;  Laterality: Right;   CATARACT EXTRACTION W/PHACO Left 01/03/2021   Procedure: CATARACT EXTRACTION PHACO AND INTRAOCULAR LENS PLACEMENT (IOC) LEFT DIABETIC  7.37 01:15.6 9.8%;  Surgeon: Annell Kidney, MD;  Location: [facility];  Service: Ophthalmology;  Laterality: Left;   CHOLECYSTECTOMY     DILATION AND CURETTAGE OF UTERUS  before 1990   X2   NECK SURGERY  03/30/2008   Per family, pt broke her neck   STOMACH SURGERY     tumor removed, per family     Social History:   reports that she has never smoked. She has never used smokeless tobacco. She reports that she does not drink alcohol and does not use drugs.   Family History:  Her family history is not on file.   Allergies Allergies  Allergen Reactions   Penicillins Rash     Home Medications  Prior to Admission medications   Medication Sig Start Date End Date Taking? Authorizing Provider  aspirin EC 81 MG tablet Take 1 tablet by mouth daily.    [provider]  Calcium Carbonate-Vit D-Min (CALCIUM 1200 PO) Take by mouth.    [provider]  furosemide  (LASIX ) 40 MG tablet TAKE 1 TABLET BY MOUTH EVERY DAY 08/27/21   Theron Flavin, MD  glipiZIDE  (GLUCOTROL  XL) 5 MG 24 hr tablet Take 3 tablets (15 mg total) by mouth daily. 11/13/21   Masoud, Javed, MD  levothyroxine (SYNTHROID, LEVOTHROID) 88 MCG tablet Take 88 mcg by mouth every morning. 12/09/17   [provider]  loratadine  (CLARITIN ) 10 MG tablet TAKE 1 TABLET BY MOUTH EVERY DAY 09/24/22   Theron Flavin, MD  losartan  (COZAAR ) 50 MG tablet TAKE 1 TABLET BY MOUTH EVERY DAY 06/10/22   Masoud, Javed, MD  lovastatin (MEVACOR) 10 MG tablet TAKE 1 TABLET ONCE A DAY ORALLY 10/20/17   [provider]  metFORMIN  (GLUCOPHAGE ) 1000 MG tablet TAKE 1 TABLET BY MOUTH TWICE A DAY 12/20/20   Theron Flavin, MD  metoprolol  succinate (TOPROL -XL) 50 MG 24 hr tablet TAKE 1 TABLET BY MOUTH DAILY. TAKE WITH  OR IMMEDIATELY FOLLOWING A MEAL. 08/23/22   Theron Flavin, MD  Omega-3 Fatty Acids (FISH OIL) 500 MG CAPS Take 1,000 mg by mouth daily.    [provider]  omeprazole  (PRILOSEC) 20 MG capsule TAKE 1 CAPSULE BY MOUTH EVERY DAY 06/04/21   Theron Flavin, MD     Critical care time: 40 minutes     Cherylann Corpus, AGACNP-BC [facility] Prefer epic messenger for cross cover needs If after hours, please call E-link

## 2024-01-29 NOTE — Plan of Care (Signed)
 Brief note.  Complete consult note to follow.   Chart reviewed.  Assessed patient.  Spoke with patient's daughter-in-law [name].  Extensive discussion of next of kin decision maker.  [name] is patient's next of kin and surrogate decision maker.   Family wishes to pursue full code and full scope for now.   Updated NP [name] and Dr. Lucina Sabal of my discussion.    Eller Gut, NP Palliative Medicine   No charge

## 2024-01-29 NOTE — Progress Notes (Deleted)
 Brief note.  Complete consult note to follow.   Chart reviewed.  Assessed patient.  Spoke with patient's daughter-in-law [name].  Extensive discussion of next of kin decision maker.  [name] is patient's next of kin and surrogate decision maker.   Family wishes to pursue full code and full scope for now.   Updated NP [name] and Dr. Lucina Sabal of my discussion.    Eller Gut, NP Palliative Medicine   No charge

## 2024-01-29 NOTE — Inpatient Diabetes Management (Signed)
 Inpatient Diabetes Program Recommendations  AACE/ADA: New Consensus Statement on Inpatient Glycemic Control (2015)  Target Ranges:  Prepandial:   less than 140 mg/dL      Peak postprandial:   less than 180 mg/dL (1-2 hours)      Critically ill patients:  140 - 180 mg/dL    Latest Reference Range & Units 01/28/24 21:38  Sodium 135 - 145 mmol/L 134 (L)  Potassium 3.5 - 5.1 mmol/L 4.2  Chloride 98 - 111 mmol/L 97 (L)  CO2 22 - 32 mmol/L 12 (L)  Glucose 70 - 99 mg/dL 161 (H)  BUN 8 - 23 mg/dL 16  Creatinine 0.96 - 0.45 mg/dL 4.09 (H)  Calcium 8.9 - 10.3 mg/dL 7.9 (L)  Anion gap 5 - 15  25 (H)    Latest Reference Range & Units 01/28/24 15:33 01/28/24 21:42 01/28/24 21:46 01/28/24 23:39 01/29/24 04:05 01/29/24 04:33 01/29/24 05:35 01/29/24 06:32  Glucose-Capillary 70 - 99 mg/dL 811 (H) 914 (H) 782 (H) 389 (H) 356 (H) 330 (H)  IV Insulin Drip Started @0437  293 (H) 240 (H)  (H): Data is abnormally high  Admit with: Acute Hypoxic Respiratory Failure and Severe Sepsis with Septic Shock due to Community Acquired Pneumonia, along with concern for Acute decompensated CHF and Cardiogenic shock, along with A. Fib w/ RVR  History: DM, CHF  Home DM Meds: Glipizide  15 mg daily       Metformin  1000 mg BID  Current Orders: IV Insulin Drip    Hemoglobin A1c level pending  IV Insulin Drip Started this AM  Will follow   --Will follow patient during hospitalization--  Langston Pippins RN, MSN, CDCES Diabetes Coordinator Inpatient Glycemic Control Team Team Pager: [phone] (8a-5p)

## 2024-01-29 NOTE — Progress Notes (Addendum)
 PHARMACY CONSULT NOTE - ELECTROLYTES  Pharmacy Consult for Electrolyte Monitoring and Replacement   Recent Labs: Height: 5\' 4"  (162.6 cm) Weight: 91.9 kg (202 lb 9.6 oz) IBW/kg (Calculated) : 54.7 Estimated Creatinine Clearance: 33 mL/min (A) (by C-G formula based on SCr of 1.47 mg/dL (H)). Potassium (mmol/L)  Date Value  01/29/2024 4.1   Magnesium (mg/dL)  Date Value  57/84/6962 2.0   Calcium (mg/dL)  Date Value  95/28/4132 7.8 (L)   Albumin (g/dL)  Date Value  44/09/270 3.1 (L)  01/29/2024 3.1 (L)   Phosphorus (mg/dL)  Date Value  53/66/4403 5.6 (H)   Sodium (mmol/L)  Date Value  01/29/2024 136   Assessment  Tanya Frye is a 83 y.o. female presenting with septic shock 2/2 CAP. PMH significant for COPD, CHF,  hypertension, T2DM. Pharmacy has been consulted to monitor and replace electrolytes.  Diet: NPO Pertinent medications: Amiodarone, insulin gtt, bicarb gtt, epinephrine , norepinephrine, vasopressin  Goal of Therapy: Electrolytes WNL  Plan:  No electrolyte replacement indicated at this time Re-check labs tomorrow AM  Thank you for allowing pharmacy to be a part of this patient's care.  Janiel Crisostomo B Jovane Foutz 01/29/2024 7:44 AM

## 2024-01-29 NOTE — Consult Note (Signed)
 PHARMACY - ANTICOAGULATION CONSULT NOTE  Pharmacy Consult for Heparin Indication: atrial fibrillation  Allergies  Allergen Reactions   Penicillins Rash    Patient Measurements: Height: 5\' 4"  (162.6 cm) Weight: 89.4 kg (197 lb 1.5 oz) IBW/kg (Calculated) : 54.7 HEPARIN DW (KG): 74.7  Vital Signs: Temp: 98.5 F (36.9 C) (05/08 0400) Temp Source: Axillary (05/08 0000) BP: 95/71 (05/08 0300) Pulse Rate: 100 (05/08 0330)  Labs: Recent Labs    01/28/24 0914 01/28/24 0915 01/28/24 1747 01/28/24 2138 01/28/24 2351 01/29/24 0401  HGB 13.1  --   --   --   --  12.3  HCT 42.7  --   --   --   --  39.6  PLT 495*  --   --   --   --  421*  APTT  --   --   --  78*  --   --   LABPROT 13.8  --   --   --   --   --   INR 1.0  --   --   --   --   --   HEPARINUNFRC  --   --   --   --   --  0.64  CREATININE 1.11*  --   --  1.59*  --   --   TROPONINIHS  --    < > 106* 223* 311*  --    < > = values in this interval not displayed.    Estimated Creatinine Clearance: 30.1 mL/min (A) (by C-G formula based on SCr of 1.59 mg/dL (H)).   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 female admitted with Acute Hypoxic Respiratory Failure and Severe Sepsis with Septic Shock due to Community Acquired Pneumonia, along with concern for Acute decompensated CHF and Cardiogenic shock, along with A. Fib w/ RVR now requiring intubation. Pharmacy has been consulted to initiate and dose continuous heparin infusion.  Baseline labs:   Goal of Therapy:  Heparin level 0.3-0.7 units/ml Monitor platelets by anticoagulation protocol: Yes   Plan:  5/8:  HL @ 0401 =  0.64, therapeutic X 1 - Will continue pt on current rate and recheck HL in 8 hrs.  Continue to monitor H&H and platelets  Tyrick Dunagan D 01/29/2024,4:34 AM

## 2024-01-29 NOTE — Consult Note (Signed)
 Cardiology Consult    Patient ID: ESMIE ROMRELL MRN: [id number], DOB/AGE: 06/05/1941   Admit date: 01/28/2024 Date of Consult: 01/29/2024  Primary Physician: Vicente Graham, No Primary Cardiologist: Belva Boyden, MD Requesting Provider: Kirk Peper. Assaker, MD  Patient Profile    Tanya Frye is a 83 y.o. female with a history of COPD, HFpEF, HTN, DMII, gastric cancer, and HOH, who is being seen today for the evaluation of Afib w/ RVR at the request of Dr. Lucina Sabal.  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   CHF (congestive heart failure) (HCC)    Diabetes mellitus without complication (HCC)    HOH (hard of hearing)    extremely HOH   Hypertension    Palpitations    occasional related to meds/heart races    Past Surgical History:  Procedure Laterality Date   ABDOMINAL HYSTERECTOMY  1990   CATARACT EXTRACTION W/PHACO Right 12/20/2020   Procedure: CATARACT EXTRACTION PHACO AND INTRAOCULAR LENS PLACEMENT (IOC) RIGHT DIABETIC 8.13 01:17.6 10.5%;  Surgeon: Annell Kidney, MD;  Location: [facility];  Service: Ophthalmology;  Laterality: Right;   CATARACT EXTRACTION W/PHACO Left 01/03/2021   Procedure: CATARACT EXTRACTION PHACO AND INTRAOCULAR LENS PLACEMENT (IOC) LEFT DIABETIC  7.37 01:15.6 9.8%;  Surgeon: Annell Kidney, MD;  Location: [facility];  Service: Ophthalmology;  Laterality: Left;   CHOLECYSTECTOMY     DILATION AND CURETTAGE OF UTERUS  before 1990   X2   NECK SURGERY  03/30/2008   Per family, pt broke her neck   STOMACH SURGERY     tumor removed, per family     Allergies  Allergies  Allergen Reactions   Penicillins Rash       History of Present Illness   83 y.o. female with a history of COPD, HFpEF, HTN, DMII, gastric cancer, and difficulty hearing.  Information regarding current admission is limited due to respiratory failure requiring intubation on admission.  Per notes, on  May 6, Patient complained to her family members regarding productive cough and general malaise.  On day 7, she contacted family members complaining of severe shortness of breath and EMS was called.  Upon arrival to the emergency department, she became hypotensive (68/57) and tachycardic with A-fib and RVR.  Temperature was 96.9.  She was initially placed on BiPAP and was saturating at 96%.  White count was elevated at 22.8 with a lactate of 4.8.  BNP 251.  Respiratory panel negative.  Chest x-ray with multifocal opacities and bilateral pleural effusions.  CT of the chest was negative for PE but did show medium to large sized bilateral pleural effusions (right greater than left), atelectasis, bilateral interstitial prominence.  She was placed on IV fluids, azithromycin, and Rocephin.  Due to ongoing respiratory failure, she required intubation on the evening of May 7.  In addition to requiring multiple vasopressors, she was placed on amiodarone therapy with improved rate control, trending in the 90s to low 100s at this time.  She remains intubated and sedated and continues to require vasopressors.  Inpatient Medications   Subjective    aspirin  81 mg Per Tube Daily   Chlorhexidine Gluconate Cloth  6 each Topical Daily   digoxin  0.0625 mg Intravenous Daily   docusate  100 mg Per Tube BID   free water  30 mL Per Tube Q4H   furosemide   40 mg Intravenous Once   hydrocortisone sod  succinate (SOLU-CORTEF) inj  100 mg Intravenous Q12H   insulin aspart  2-6 Units Subcutaneous Q4H   insulin aspart  7 Units Subcutaneous Q4H   insulin glargine-yfgn  10 Units Subcutaneous Daily   ipratropium-albuterol  3 mL Nebulization Q4H   [START ON 01/30/2024] multivitamin with minerals  1 tablet Per Tube Daily   mouth rinse  15 mL Mouth Rinse Q2H   pantoprazole (PROTONIX) IV  40 mg Intravenous Daily   polyethylene glycol  17 g Per Tube Daily    Family History -Per notes    History reviewed. No pertinent family  history. has no family status information on file.    Social History -per notes    Social History   Socioeconomic History   Marital status: Widowed    Spouse name: Not on file   Number of children: 1   Years of education: Not on file   Highest education level: 10th grade  Occupational History   Not on file  Tobacco Use   Smoking status: Never   Smokeless tobacco: Never  Vaping Use   Vaping status: Not on file  Substance and Sexual Activity   Alcohol use: Never   Drug use: Never   Sexual activity: Not Currently  Other Topics Concern   Not on file  Social History Narrative   Not on file   Social Drivers of Health   Financial Resource Strain: Low Risk  (09/05/2021)   Overall Financial Resource Strain (CARDIA)    Difficulty of Paying Living Expenses: Not hard at all  Food Insecurity: Patient Unable To Answer (01/29/2024)   Hunger Vital Sign    Worried About Running Out of Food in the Last Year: Patient unable to answer    Ran Out of Food in the Last Year: Patient unable to answer  Transportation Needs: Patient Unable To Answer (01/29/2024)   PRAPARE - Transportation    Lack of Transportation (Medical): Patient unable to answer    Lack of Transportation (Non-Medical): Patient unable to answer  Physical Activity: Inactive (09/05/2021)   Exercise Vital Sign    Days of Exercise per Week: 0 days    Minutes of Exercise per Session: 0 min  Stress: No Stress Concern Present (09/05/2021)   [employer] of Occupational Health - Occupational Stress Questionnaire    Feeling of Stress : Not at all  Social Connections: Patient Unable To Answer (01/29/2024)   Social Connection and Isolation Panel [NHANES]    Frequency of Communication with Friends and Family: Patient unable to answer    Frequency of Social Gatherings with Friends and Family: Patient unable to answer    Attends Religious Services: Patient unable to answer    Active Member of Clubs or Organizations: Patient unable to  answer    Attends Banker Meetings: Patient unable to answer    Marital Status: Patient unable to answer  Intimate Partner Violence: Patient Unable To Answer (01/29/2024)   Humiliation, Afraid, Rape, and Kick questionnaire    Fear of Current or Ex-Partner: Patient unable to answer    Emotionally Abused: Patient unable to answer    Physically Abused: Patient unable to answer    Sexually Abused: Patient unable to answer     Review of Systems    Patient is intubated and sedated and unable to provide any meaningful history at this time.     Objective   Physical Exam    Blood pressure 90/69, pulse (!) 112, temperature 98.1 F (36.7 C), temperature source Axillary,  resp. rate (!) 24, height 5\' 4"  (1.626 m), weight 91.9 kg, SpO2 96%.  General: Intubated, sedated  Psych: Sedated Neuro: Sedated HEENT: Normal  Neck: Supple without bruits.  Difficult to gauge JVP due to ventilator apparatus. Lungs:  Resp regular and unlabored, coarse, ventilated breath sounds. Heart: Irregular irregular, tachycardic no s3, s4, or murmurs. Abdomen: Soft, non-tender, non-distended, BS + x 4.  Extremities: No clubbing, cyanosis or edema. DP/PT2+, Radials 2+ and equal bilaterally.  Labs    Cardiac Enzymes Recent Labs  Lab 01/28/24 2138 01/28/24 2351 01/29/24 0401 01/29/24 0800 01/29/24 1140  TROPONINIHS 223* 311* 539* 826* 996*     BNP    Component Value Date/Time   BNP 251.9 (H) 01/28/2024 0914    Lab Results  Component Value Date   WBC 28.1 (H) 01/29/2024   HGB 12.3 01/29/2024   HCT 39.6 01/29/2024   MCV 85.7 01/29/2024   PLT 421 (H) 01/29/2024    Recent Labs  Lab 01/29/24 0401  NA 136  K 4.1  CL 96*  CO2 17*  BUN 17  CREATININE 1.47*  CALCIUM 7.8*  PROT 6.1*  BILITOT 0.9  ALKPHOS 24*  ALT 38  AST 51*  GLUCOSE 342*     Radiology Studies    DG Abd 1 View Result Date: 01/29/2024 CLINICAL DATA:  NG placement. EXAM: ABDOMEN - 1 VIEW COMPARISON:  Abdominal  radiograph dated 01/28/2024. FINDINGS: Partially visualized enteric tube with tip and side port in the proximal stomach. IMPRESSION: Enteric tube with tip and side port in the proximal stomach. Electronically Signed   By: Angus Bark M.D.   On: 01/29/2024 11:52   DG Chest Port 1 View Result Date: 01/28/2024 CLINICAL DATA:  Intubation EXAM: PORTABLE CHEST 1 VIEW COMPARISON:  Chest x-ray 01/28/2024 FINDINGS: Endotracheal tube tip is 2 cm above the carina. Enteric tube extends below the diaphragm. The heart is enlarged. There are small bilateral pleural effusions. There is central pulmonary vascular congestion. There is no pneumothorax. The heart is enlarged. No acute fractures are seen. There is no focal lung infiltrate. IMPRESSION: 1. Endotracheal tube tip is 2 cm above the carina. 2. Cardiomegaly with central pulmonary vascular congestion and small bilateral pleural effusions. Electronically Signed   By: Tyron Gallon M.D.   On: 01/28/2024 19:52   DG Abd 1 View Result Date: 01/28/2024 CLINICAL DATA:  Check gastric catheter placement EXAM: ABDOMEN - 1 VIEW COMPARISON:  None Available. FINDINGS: Gastric catheter is noted within the stomach. Scattered bowel gas is seen. No free air is noted. IMPRESSION: Gastric catheter within the stomach. Electronically Signed   By: Violeta Grey M.D.   On: 01/28/2024 19:46   CT Angio Abd/Pel w/ and/or w/o Result Date: 01/28/2024 CLINICAL DATA:  Mesenteric ischemia EXAM: CTA ABDOMEN AND PELVIS WITHOUT AND WITH CONTRAST TECHNIQUE: Multidetector CT imaging of the abdomen and pelvis was performed using the standard protocol during bolus administration of intravenous contrast. Multiplanar reconstructed images and MIPs were obtained and reviewed to evaluate the vascular anatomy. RADIATION DOSE REDUCTION: This exam was performed according to the departmental dose-optimization program which includes automated exposure control, adjustment of the mA and/or kV according to patient  size and/or use of iterative reconstruction technique. CONTRAST:  OMNIPAQUE IOHEXOL 350 MG/ML SOLN COMPARISON:  CT abdomen and pelvis January 12, 2015 FINDINGS: VASCULAR Aorta: No arterial occlusion with diffuse atheromatous of the distal infrarenal abdominal aorta at the bifurcation with diffuse atheromatous. Celiac: Patent without evidence of aneurysm, dissection, vasculitis or significant  stenosis. SMA: Patent without evidence of aneurysm, dissection, vasculitis or significant stenosis. Renals: Both renal arteries are patent without evidence of aneurysm, dissection, vasculitis, fibromuscular dysplasia or significant stenosis. IMA: Patent without evidence of aneurysm, dissection, vasculitis or significant stenosis. Inflow: Patent without evidence of aneurysm, dissection, vasculitis or significant stenosis. Proximal Outflow: Bilateral common femoral and visualized portions of the superficial and profunda femoral arteries are patent without evidence of aneurysm, dissection, vasculitis or significant stenosis. Veins: No obvious venous abnormality within the limitations of this arterial phase study. Review of the MIP images confirms the above findings. NON-VASCULAR Lower chest: Bilateral pleural effusion with basilar infiltrates and atelectasis Hepatobiliary: Diffuse fatty liver changes without parenchymal lesions or biliary dilatation. Prior cholecystectomy Pancreas: Unremarkable. No pancreatic ductal dilatation or surrounding inflammatory changes. Spleen: Normal in size without focal abnormality. Adrenals/Urinary Tract: Adrenal glands are unremarkable. Kidneys are normal, without renal calculi, focal lesion, or hydronephrosis. Bladder is unremarkable. Stomach/Bowel: Stomach is within normal limits. Appendix appears normal. No evidence of bowel wall thickening, distention, or inflammatory changes. No definitive evidence of ischemic colitis Lymphatic: Aortic atherosclerosis. No enlarged abdominal or pelvic lymph  nodes. Reproductive: Status post hysterectomy. No adnexal masses. Other: No abdominal wall hernia or abnormality. No abdominopelvic ascites. Musculoskeletal: No acute or significant osseous findings. IMPRESSION: *Diffuse fatty liver changes. *No definitive evidence of ischemic colitis. *Aortic atherosclerosis. NON-VASCULAR Electronically Signed   By: Fredrich Jefferson M.D.   On: 01/28/2024 17:04   CT Angio Chest Pulmonary Embolism (PE) W or WO Contrast Result Date: 01/28/2024 CLINICAL DATA:  Pulmonary embolism EXAM: CT ANGIOGRAPHY CHEST WITH CONTRAST TECHNIQUE: Multidetector CT imaging of the chest was performed using the standard protocol during bolus administration of intravenous contrast. Multiplanar CT image reconstructions and MIPs were obtained to evaluate the vascular anatomy. RADIATION DOSE REDUCTION: This exam was performed according to the departmental dose-optimization program which includes automated exposure control, adjustment of the mA and/or kV according to patient size and/or use of iterative reconstruction technique. CONTRAST:  125mL OMNIPAQUE IOHEXOL 350 MG/ML SOLN COMPARISON:  None Available. FINDINGS: Cardiovascular: Satisfactory opacification of the pulmonary arteries to the segmental level. No evidence of pulmonary embolism. Normal heart size. No pericardial effusion. Coronary artery calcifications Mediastinum/Nodes: No enlarged mediastinal, hilar, or axillary lymph nodes. Thyroid gland, trachea, and esophagus demonstrate no significant findings. Lungs/Pleura: Medium to large sized bilateral pleural effusions right larger than left with basilar infiltrates and atelectasis and bilateral interstitial prominence that could correlate with congestive changes, superimposed pneumonia not excluded. No pulmonary nodules or masses. Upper Abdomen: No acute abnormality. Musculoskeletal: No chest wall abnormality. No acute or significant osseous findings. Review of the MIP images confirms the above findings.  IMPRESSION: *No evidence of pulmonary embolism. *Medium to large sized bilateral pleural effusions right larger than left with basilar infiltrates and atelectasis and bilateral interstitial prominence that could correlate with congestive changes, superimposed pneumonia not excluded. Electronically Signed   By: Fredrich Jefferson M.D.   On: 01/28/2024 17:02   DG Chest Port 1 View Result Date: 01/28/2024 CLINICAL DATA:  Sepsis. EXAM: PORTABLE CHEST 1 VIEW COMPARISON:  Chest radiograph dated 01/03/2018. FINDINGS: There is mild cardiomegaly and vascular congestion. Small bilateral pleural effusions and bibasilar atelectasis or infiltrate. No pneumothorax. Evaluation however is very limited due to superimposition of the patient's mandible over the upper chest. No acute osseous pathology. IMPRESSION: 1. Mild cardiomegaly and vascular congestion. 2. Small bilateral pleural effusions and bibasilar atelectasis or infiltrate. Electronically Signed   By: Angus Bark M.D.   On:  01/28/2024 10:31      ECG & Cardiac Imaging    Atrial fibrillation, 103, left axis deviation, poor R wave progression- personally reviewed.  Assessment & Plan    1.  Septic shock/community-acquired pneumonia: Admitted with respiratory failure and hypotension.  Imaging concerning for pneumonia, CHF, and bilateral pleural effusions.  She was intubated on the evening of May 7 and has been managed by critical care medicine with antibiotics, steroids, bronchodilators, and vasopressors.  Patient currently remains a full code.  2.  Atrial fibrillation with rapid ventricular response: Atrial fibrillation noted on arrival to the emergency department.  Unknown duration.  Amiodarone started in the emergency department in the setting of persistent hypotension and tachycardia.  Rates currently reasonable, hovering in the 90s to low 100s.  Limited options for alternate rate controlling agents in the setting of hypotension requiring vasopressors.  In that  setting, we will continue amiodarone and heparin at this time.  Reasonable to continue digoxin for the time being.  Follow-up echocardiogram and TSH.  3.  Non-STEMI: In the setting of above, troponin elevated up to 996 on most recent evaluation.  Follow-up echocardiogram.  Continue heparin.  Will add aspirin.  Further recommendations pending echo.  Plan to add statin once taking p.o.'s.  No beta-blocker in the setting of hypotension.  4.  Acute on chronic heart failure/cardiogenic shock: Reported history of HFpEF.  Vascular congestion and pleural effusions noted on chest imaging.  Body habitus makes evaluation of volume status challenging.  Follow-up echo.  She has not received any diuretics to date and in the setting of hypotension, received multiple liters of IV fluids.  Watch volume status closely with low threshold to diurese as pressure allows.  Co-ox 69.8 yesterday afternoon, though concerned about low-output - re-eval today.  Transduce CVP.  Risk Assessment/Risk Scores:     TIMI Risk Score for Unstable Angina or Non-ST Elevation MI:   The patient's TIMI risk score is 3, which indicates a 13% risk of all cause mortality, new or recurrent myocardial infarction or need for urgent revascularization in the next 14 days.  New [place]  Heart Association (NYHA) Functional Class NYHA Class IV  CHA2DS2-VASc Score = 6   This indicates a 9.7% annual risk of stroke. The patient's score is based upon: CHF History: 1 HTN History: 1 Diabetes History: 1 Stroke History: 0 Vascular Disease History: 0 Age Score: 2 Gender Score: 1     Signed, Laneta Pintos, NP 01/29/2024, 3:00 PM  For questions or updates, please contact   Please consult [email or web address] for contact info under Cardiology/STEMI.

## 2024-01-29 NOTE — Consult Note (Signed)
 Consultation Note Date: 01/29/2024 at 1130  Patient Name: Tanya Frye  DOB: 1940/10/24  MRN: [id number]  Age / Sex: 83 y.o., female  PCP: Pcp, No Referring Physician: [name], MD  HPI/Patient Profile: 83 y.o. female  with past medical history of COPD, CHF, HTN, type 2 diabetes, and HOH admitted on 01/28/2024 with acute respiratory distress requiring BiPAP.  Upon arrival to ED, patient found to be in A-fib with RVR and was given amiodarone .  Additionally, patient developed septic and cardiogenic shock requiring pressor support.  PCCM was consulted and later that evening patient required intubation.  Patient remains critically ill with multiorgan failure supported by 3 vasopressors and worsening leukocytosis.  LVEF on echo revealed 25-30% with global hypokinesis.  PMT was consulted to support patient and family with goals of care discussions.   Clinical Assessment and Goals of Care: Extensive chart review completed prior to meeting patient including labs, vital signs, imaging, progress notes, orders, and available advanced directive documents from current and previous encounters. I then met with patient and her daughter-in-law [name] at bedside to discuss diagnosis prognosis, GOC, EOL wishes, disposition and options.  I introduced Palliative Medicine as specialized medical care for people living with serious illness. It focuses on providing relief from the symptoms and stress of a serious illness. The goal is to improve quality of life for both the patient and the family.  We discussed a brief life review of the patient.  Patient is a widow and has 1 deceased son.  Deceased sons wife is [name] who helps care for patient regularly.  [name] takes patient to her doctor appointments, helps get her medications, and helps keep patient's home and meals.  [name] describes patient is being fiercely independent and  stubborn.  She shares that patient worked various jobs early on but focused on being a grandparent and helping raise her grandchildren later in life.  As far as functional and nutritional status patient was independent with ADLs PTA.  [name] denies issues with p.o. intake or access to meals.  We discussed patient's current illness-multiorgan failure, ventilatory support, use of 3 pressors, leukocytosis-and what it means in the larger context of patient's on-going co-morbidities-advanced age, CHF.  Extensive disucssion and education provided on ejection fraction, lactic and metabolic acidosis, ALT-AST, creatinine, albumin, WBC, feeding tube, amiodarone .  Discussed patient's functional, nutritional, and cognitive status remain poor and therefore contribute to an overall poor and guarded prognosis.  I attempted to elicit values and goals of care important to the patient.  Jodie shares patient has never made her wishes known or created a living will/HCPOA.  [name] shares she is really not sure what the patient would want for herself at this time.  She shares she would like to continue to get updates and share them with the rest of the family in order to come to group decision on how best to move forward for [facility].  Discussed that despite our best medical efforts to optimize patient, there is a high chance that patient could still  experience a cardiac arrest.  Encouraged [name] to consider DNR status understanding evidenced based poor outcomes in similar hospitalized patients, as the cause of the arrest is likely associated with chronic/terminal disease rather than a reversible acute cardio-pulmonary event.  Discussed that could be a traumatic and for [place] should her heart stop and we initiate CPR/ACLS protocol.  [name] shares understanding and says she would like more time to consider it and counsel with her family.  Next of kin and surrogate decision maker status discussed with [name].  In the absence of an HCPOA,  guardian, spouse, and adult children, patient's next of kin would fall to reasonably available adult siblings.  [name] shares patient has 1 sister who has patient would speak with on a daily basis.  However, [name] has now been keeping sister updated via telephone multiple times throughout [facility] time in the hospital.  [name] shares that sister defers care and decision making of [place] to [place] but that family is in constant contact with each other and makes groups decisions.  The difference between aggressive medical intervention and comfort care was considered in light of the patient's goals of care.  Discussed continuing current care or liberating patient from artificial means.  [name] shares understanding and states she would like to continue to take it 1 day at a time.  Quality of life versus quantity of life reviewed.  The mortality discussed.   Therapeutic silence, active listening, and emotional support provided.  Questions and concerns were addressed. The family was encouraged to call with questions or concerns.  [name] agreeable for PMT to continue to follow.  I plan to follow-up with patient and family tomorrow.  Primary Decision Maker NEXT OF KIN  Physical Exam Vitals reviewed.  Constitutional:      General: She is not in acute distress.    Appearance: She is ill-appearing.  HENT:     Head: Normocephalic.  Pulmonary:     Comments: Ventilatory support Abdominal:     Palpations: Abdomen is soft.  Skin:    General: Skin is warm and dry.     Coloration: Skin is pale.     Palliative Assessment/Data: 30%     Thank you for this consult. Palliative medicine will continue to follow and assist holistically.   Time Total: 105 minutes  Time spent includes: Detailed review of medical records (labs, imaging, vital signs), medically appropriate exam (mental status, respiratory, cardiac, skin), discussed with treatment team, counseling and educating patient, family and staff, documenting clinical  information, medication management and coordination of care.  Signed by: Eller Gut, DNP, FNP-BC Palliative Medicine   Please contact Palliative Medicine Team providers via [facility] for questions and concerns.

## 2024-01-30 ENCOUNTER — Inpatient Hospital Stay

## 2024-01-30 DIAGNOSIS — J90 Pleural effusion, not elsewhere classified: Secondary | ICD-10-CM

## 2024-01-30 DIAGNOSIS — E8729 Other acidosis: Secondary | ICD-10-CM

## 2024-01-30 DIAGNOSIS — I10 Essential (primary) hypertension: Secondary | ICD-10-CM

## 2024-01-30 DIAGNOSIS — A419 Sepsis, unspecified organism: Secondary | ICD-10-CM | POA: Diagnosis not present

## 2024-01-30 DIAGNOSIS — J9600 Acute respiratory failure, unspecified whether with hypoxia or hypercapnia: Secondary | ICD-10-CM | POA: Diagnosis not present

## 2024-01-30 DIAGNOSIS — J9601 Acute respiratory failure with hypoxia: Secondary | ICD-10-CM | POA: Diagnosis not present

## 2024-01-30 DIAGNOSIS — I420 Dilated cardiomyopathy: Secondary | ICD-10-CM | POA: Diagnosis not present

## 2024-01-30 DIAGNOSIS — R579 Shock, unspecified: Secondary | ICD-10-CM

## 2024-01-30 DIAGNOSIS — N179 Acute kidney failure, unspecified: Secondary | ICD-10-CM | POA: Diagnosis not present

## 2024-01-30 LAB — RENAL FUNCTION PANEL
Albumin: 2.9 g/dL — ABNORMAL LOW (ref 3.5–5.0)
Anion gap: 15 (ref 5–15)
BUN: 24 mg/dL — ABNORMAL HIGH (ref 8–23)
CO2: 24 mmol/L (ref 22–32)
Calcium: 7.4 mg/dL — ABNORMAL LOW (ref 8.9–10.3)
Chloride: 95 mmol/L — ABNORMAL LOW (ref 98–111)
Creatinine, Ser: 1.43 mg/dL — ABNORMAL HIGH (ref 0.44–1.00)
GFR, Estimated: 37 mL/min — ABNORMAL LOW (ref >60)
Glucose, Bld: 283 mg/dL — ABNORMAL HIGH (ref 70–99)
Phosphorus: 4.6 mg/dL (ref 2.5–4.6)
Potassium: 3.5 mmol/L (ref 3.5–5.1)
Sodium: 134 mmol/L — ABNORMAL LOW (ref 135–145)

## 2024-01-30 LAB — BLOOD GAS, ARTERIAL
Acid-base deficit: 3.8 mmol/L — ABNORMAL HIGH (ref 0.0–2.0)
Bicarbonate: 20.7 mmol/L (ref 20.0–28.0)
FIO2: 45 %
MECHVT: 450 mL
O2 Saturation: 93.7 %
PEEP: 5 cmH2O
Patient temperature: 37
RATE: 20 {breaths}/min
pCO2 arterial: 35 mmHg (ref 32–48)
pH, Arterial: 7.38 (ref 7.35–7.45)
pO2, Arterial: 70 mmHg — ABNORMAL LOW (ref 83–108)

## 2024-01-30 LAB — THYROID PANEL WITH TSH
Free Thyroxine Index: 2.7 (ref 1.2–4.9)
T3 Uptake Ratio: 29 % (ref 24–39)
T4, Total: 9.2 ug/dL (ref 4.5–12.0)
TSH: 2.41 u[IU]/mL (ref 0.450–4.500)

## 2024-01-30 LAB — CBC
HCT: 34.9 % — ABNORMAL LOW (ref 36.0–46.0)
Hemoglobin: 11.3 g/dL — ABNORMAL LOW (ref 12.0–15.0)
MCH: 26.8 pg (ref 26.0–34.0)
MCHC: 32.4 g/dL (ref 30.0–36.0)
MCV: 82.9 fL (ref 80.0–100.0)
Platelets: 244 10*3/uL (ref 150–400)
RBC: 4.21 MIL/uL (ref 3.87–5.11)
RDW: 16 % — ABNORMAL HIGH (ref 11.5–15.5)
WBC: 23.4 10*3/uL — ABNORMAL HIGH (ref 4.0–10.5)
nRBC: 0 % (ref 0.0–0.2)

## 2024-01-30 LAB — COOXEMETRY PANEL
Carboxyhemoglobin: 1.3 % (ref 0.5–1.5)
Methemoglobin: 1.1 % (ref 0.0–1.5)
O2 Saturation: 97.7 %
Total hemoglobin: 11.5 g/dL — ABNORMAL LOW (ref 12.0–16.0)
Total oxygen content: 95.4 %
Total oxygen content: 73.9 %

## 2024-01-30 LAB — GLUCOSE, CAPILLARY
Glucose-Capillary: 155 mg/dL — ABNORMAL HIGH (ref 70–99)
Glucose-Capillary: 208 mg/dL — ABNORMAL HIGH (ref 70–99)
Glucose-Capillary: 209 mg/dL — ABNORMAL HIGH (ref 70–99)
Glucose-Capillary: 214 mg/dL — ABNORMAL HIGH (ref 70–99)
Glucose-Capillary: 256 mg/dL — ABNORMAL HIGH (ref 70–99)
Glucose-Capillary: 277 mg/dL — ABNORMAL HIGH (ref 70–99)

## 2024-01-30 LAB — HEMOGLOBIN A1C
Hgb A1c MFr Bld: 6.7 % — ABNORMAL HIGH (ref 4.8–5.6)
Mean Plasma Glucose: 145.59 mg/dL

## 2024-01-30 LAB — MAGNESIUM: Magnesium: 1.9 mg/dL (ref 1.7–2.4)

## 2024-01-30 LAB — LACTIC ACID, PLASMA: Lactic Acid, Venous: 2.3 mmol/L (ref 0.5–1.9)

## 2024-01-30 LAB — TSH: TSH: 0.443 u[IU]/mL (ref 0.350–4.500)

## 2024-01-30 LAB — HEPARIN LEVEL (UNFRACTIONATED): Heparin Unfractionated: 0.77 [IU]/mL — ABNORMAL HIGH (ref 0.30–0.70)

## 2024-01-30 LAB — TROPONIN I (HIGH SENSITIVITY): Troponin I (High Sensitivity): 661 ng/L (ref <18)

## 2024-01-30 MED ORDER — SODIUM CHLORIDE 0.9% FLUSH: 10.0000 mL | INTRAVENOUS | Status: DC | PRN

## 2024-01-30 MED ORDER — SODIUM CHLORIDE 0.9% FLUSH
10.0000 mL | Freq: Two times a day (BID) | INTRAVENOUS | Status: DC
  Administered 2024-01-30 – 2024-02-01 (×5): 10 mL
  Administered 2024-02-01: 40 mL
  Administered 2024-02-02: 10 mL
  Administered 2024-02-02 – 2024-02-03 (×2): 30 mL
  Administered 2024-02-03 – 2024-02-07 (×8): 10 mL
  Administered 2024-02-07: 20 mL
  Administered 2024-02-08: 10 mL
  Administered 2024-02-08: 20 mL
  Administered 2024-02-09: 10 mL
  Administered 2024-02-09: 30 mL
  Administered 2024-02-10 – 2024-02-13 (×7): 10 mL

## 2024-01-30 MED ORDER — INSULIN ASPART 100 UNIT/ML IJ SOLN
5.0000 [IU] | INTRAMUSCULAR | Status: DC
  Administered 2024-01-30 (×2): 5 [IU] via SUBCUTANEOUS
  Filled 2024-01-30: qty 1

## 2024-01-30 MED ORDER — FUROSEMIDE 10 MG/ML IJ SOLN
60.0000 mg | Freq: Once | INTRAMUSCULAR | Status: AC
  Administered 2024-01-30: 60 mg via INTRAVENOUS
  Filled 2024-01-30: qty 6

## 2024-01-30 MED ORDER — MAGNESIUM SULFATE 2 GM/50ML IV SOLN
2.0000 g | Freq: Once | INTRAVENOUS | Status: AC
  Administered 2024-01-30: 2 g via INTRAVENOUS
  Filled 2024-01-30: qty 50

## 2024-01-30 MED ORDER — DEXMEDETOMIDINE HCL IN NACL 400 MCG/100ML IV SOLN
0.0000 ug/kg/h | INTRAVENOUS | Status: DC
  Administered 2024-01-30: 0.4 ug/kg/h via INTRAVENOUS
  Filled 2024-01-30 (×2): qty 100

## 2024-01-30 MED ORDER — INSULIN GLARGINE-YFGN 100 UNIT/ML ~~LOC~~ SOLN
7.0000 [IU] | Freq: Two times a day (BID) | SUBCUTANEOUS | Status: DC
  Administered 2024-01-30 – 2024-01-31 (×3): 7 [IU] via SUBCUTANEOUS
  Filled 2024-01-30 (×3): qty 0.07

## 2024-01-30 MED ORDER — INSULIN ASPART 100 UNIT/ML IJ SOLN
7.0000 [IU] | INTRAMUSCULAR | Status: DC
  Administered 2024-01-30 – 2024-01-31 (×6): 7 [IU] via SUBCUTANEOUS
  Filled 2024-01-30 (×6): qty 1

## 2024-01-30 MED ORDER — FENTANYL BOLUS VIA INFUSION
50.0000 ug | INTRAVENOUS | Status: DC | PRN
  Administered 2024-01-30 – 2024-02-01 (×3): 50 ug via INTRAVENOUS

## 2024-01-30 MED ORDER — AMIODARONE IV BOLUS ONLY 150 MG/100ML: 150.0000 mg | Freq: Once | INTRAVENOUS | Status: DC

## 2024-01-30 MED ORDER — POTASSIUM CHLORIDE 20 MEQ PO PACK
40.0000 meq | PACK | Freq: Once | ORAL | Status: AC
  Administered 2024-01-30: 40 meq
  Filled 2024-01-30: qty 2

## 2024-01-30 NOTE — Progress Notes (Signed)
   01/30/24 1000  Spiritual Encounters  Type of Visit Initial  Care provided to: Pt and family (Daughter-in-law at Bedside)  Referral source Chaplain assessment  Reason for visit Routine spiritual support  OnCall Visit Yes  Spiritual Framework  Presenting Themes Meaning/purpose/sources of inspiration;Goals in life/care;Values and beliefs;Coping tools;Impactful experiences and emotions;Other (comment);Community and relationships (Pt does have a pastor who visits)  Patient Stress Factors Loss;Other (Comment) (Daughter in law stated that Pt is a private person and might not always express what she needs when she needs it.)  Family Stress Factors Loss (Pt lost her son (Daughter-in-law's husband) this past Oct.)  Interventions  Spiritual Care Interventions Made Established relationship of care and support;Compassionate presence;Reflective listening;Normalization of emotions;Explored values/beliefs/practices/strengths;Prayer;Other (comment) (for Family)  Intervention Outcomes  Outcomes Connection to spiritual care;Awareness around self/spiritual resourses;Connection to values and goals of care;Awareness of support;Other (comment) (for Family)

## 2024-01-30 NOTE — Procedures (Signed)
 Arterial Catheter Insertion Procedure Note  [name]  [id number]  [birth year]  Date:01/30/24  Time:2:06 PM    Provider Performing: Delanna Fears    Procedure: Insertion of Arterial Line (21308) with US  guidance (65784)   Indication(s) Blood pressure monitoring and/or need for frequent ABGs  Consent Risks of the procedure as well as the alternatives and risks of each were explained to the patient and/or caregiver.  Consent for the procedure was obtained and is signed in the bedside chart  Anesthesia None   Time Out Verified patient identification, verified procedure, site/side was marked, verified correct patient position, special equipment/implants available, medications/allergies/relevant history reviewed, required imaging and test results available.   Sterile Technique Maximal sterile technique including full sterile barrier drape, hand hygiene, sterile gown, sterile gloves, mask, hair covering, sterile ultrasound probe cover (if used).   Procedure Description Area of catheter insertion was cleaned with chlorhexidine  and draped in sterile fashion. With real-time ultrasound guidance an arterial catheter was placed into the right radial artery.  Appropriate arterial tracings confirmed on monitor.     Complications/Tolerance None; patient tolerated the procedure well.   EBL Minimal   Specimen(s) None    Cherylann Corpus, AGACNP-BC [facility] Prefer epic messenger for cross cover needs If after hours, please call E-link

## 2024-01-30 NOTE — Consult Note (Signed)
 PHARMACY - ANTICOAGULATION CONSULT NOTE  Pharmacy Consult for Heparin  Indication: atrial fibrillation  Patient Measurements: Height: 5\' 4"  (162.6 cm) Weight: 92 kg (202 lb 13.2 oz) IBW/kg (Calculated) : 54.7 HEPARIN  DW (KG): 74.7  Labs: Recent Labs    01/28/24 0914 01/28/24 0915 01/28/24 2138 01/28/24 2351 01/29/24 0401 01/29/24 0800 01/29/24 1140 01/29/24 1610 01/29/24 2001 01/29/24 2250 01/30/24 0159  HGB 13.1  --   --   --  12.3  --   --   --   --   --  11.3*  HCT 42.7  --   --   --  39.6  --   --   --   --   --  34.9*  PLT 495*  --   --   --  421*  --   --   --   --   --  244  APTT  --   --  78*  --   --   --   --   --   --   --   --   LABPROT 13.8  --   --   --   --   --   --   --   --   --   --   INR 1.0  --   --   --   --   --   --   --   --   --   --   HEPARINUNFRC  --   --   --   --  0.64  --  0.61  --   --   --  0.77*  CREATININE 1.11*  --  1.59*  --  1.47*  --   --   --   --   --  1.43*  TROPONINIHS  --    < > 223*   < > 539*   < > 996*   < > 855* 685* 661*   < > = values in this interval not displayed.   Estimated Creatinine Clearance: 33.9 mL/min (A) (by C-G formula based on SCr of 1.43 mg/dL (H)).  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 female admitted with Acute Hypoxic Respiratory Failure and Severe Sepsis with Septic Shock due to Community Acquired Pneumonia, along with concern for Acute decompensated CHF and Cardiogenic shock, along with A. Fib w/ RVR now requiring intubation. Pharmacy has been consulted to initiate and dose continuous heparin  infusion.  Goal of Therapy:  Heparin  level 0.3-0.7  units/ml Monitor platelets by anticoagulation protocol: Yes   Plan:  5/9:  HL @ 0159 = 0.77, SUPRAtherapeutic  - Will decrease heparin  drip rate to 1000 units/hr and recheck HL 8 hrs after rate change - CBC daily   Mikalyn Hermida D 01/30/2024,4:33 AM

## 2024-01-30 NOTE — Progress Notes (Signed)
 Palliative Care Progress Note, Assessment & Plan   Patient Name: Tanya Frye       Date: 01/30/2024 DOB: Jul 19, 1941  Age: 83 y.o. MRN#: [id number] Attending Physician: [name], MD Primary Care Physician: Pcp, No Admit Date: 01/28/2024  Subjective: Patient is lying in bed, intubated and sedated with 3 pressors.  Patient's daughter-in-law [name] is at bedside during my visit.  HPI: 83 y.o. female  with past medical history of COPD, CHF, HTN, type 2 diabetes, and HOH admitted on 01/28/2024 with acute respiratory distress requiring BiPAP.   Upon arrival to ED, patient found to be in A-fib with RVR and was given amiodarone .  Additionally, patient developed septic and cardiogenic shock requiring pressor support.  PCCM was consulted and later that evening patient required intubation.   Patient remains critically ill with multiorgan failure supported by 3 vasopressors and worsening leukocytosis.  LVEF on echo revealed 25-30% with global hypokinesis.   PMT was consulted to support patient and family with goals of care discussions.   Summary of counseling/coordination of care: Extensive chart review completed prior to meeting patient including labs, vital signs, imaging, progress notes, orders, and available advanced directive documents from current and previous encounters.   After reviewing the patient's chart and assessing the patient at bedside, I spoke with patient's daughter-in-law in regards to plan of care and symptom management.  I attempted to gauge [name]'s understanding of patient's medical situation.  She shares that things are getting better.  When asked what has improved for patient, [name] shares that things are improving.  Discussed that an additional pressor has been added (now 4).  CCM is at  bedside evaluating whether patient needs a chest tube or not.  [name] shares that she wants to continue with current plan of care.  She maintains that she believes patient will either show mild improvements her start to decline, helping her know what the next steps and decisions should be for [facility].  I again highlighted with [name] the importance of ensuring medical decisions are within the context of the patient's values and GOCs.  I again recommended that [name] consider DNR status.  She shares she is taking it 1 day at a time and is hopeful that patient's body and numbers will help guide what is best for [name].  No adjustment to plan of care at this time.  Full code and full scope remains.  [name] made aware that I am off service for the weekend.  However, advised her that my PMT colleague plans to follow-up with her over the weekend.  Physical Exam Vitals reviewed.  Constitutional:      General: She is not in acute distress.    Appearance: She is ill-appearing.  HENT:     Head: Normocephalic.     Mouth/Throat:     Mouth: Mucous membranes are dry.  Pulmonary:     Comments: Ventilatory support Abdominal:     Palpations: Abdomen is soft.  Skin:    Coloration: Skin is pale.             Total Time 50 minutes   Time spent includes: Detailed review of medical records (labs, imaging, vital signs), medically appropriate exam (mental status,  respiratory, cardiac, skin), discussed with treatment team, counseling and educating patient, family and staff, documenting clinical information, medication management and coordination of care.  Judeen Nose L. Rebbeca Campi, DNP, FNP-BC Palliative Medicine Team

## 2024-01-30 NOTE — Procedures (Signed)
 Central Venous Catheter Insertion Procedure Note  Tanya Frye  [id number]  02-Oct-1940  Date:01/30/24  Time:1:50 PM   Provider Performing:Jean-Pierre Lounell Schumacher   Procedure: Insertion of Non-tunneled Central Venous [phone]) with US  guidance (25366)   Indication(s) Medication administration  Consent Risks of the procedure as well as the alternatives and risks of each were explained to the patient and/or caregiver.  Consent for the procedure was obtained and is signed in the bedside chart  Anesthesia Topical only with 1% lidocaine    Timeout Verified patient identification, verified procedure, site/side was marked, verified correct patient position, special equipment/implants available, medications/allergies/relevant history reviewed, required imaging and test results available.  Sterile Technique Maximal sterile technique including full sterile barrier drape, hand hygiene, sterile gown, sterile gloves, mask, hair covering, sterile ultrasound probe cover (if used).  Procedure Description Area of catheter insertion was cleaned with chlorhexidine  and draped in sterile fashion.  With real-time ultrasound guidance a central venous catheter was placed into the right subclavian vein. Nonpulsatile blood flow and easy flushing noted in all ports.  The catheter was sutured in place and sterile dressing applied.  Complications/Tolerance None; patient tolerated the procedure well. Chest X-ray is ordered to verify placement for internal jugular or subclavian cannulation.   Chest x-ray is not ordered for femoral cannulation.  EBL Minimal  Specimen(s) None  Tanya Kindler, MD [facility] 01/30/2024 1:50 PM

## 2024-01-30 NOTE — Progress Notes (Signed)
 NAME:  Tanya Frye, MRN:  [id number], DOB:  19-Jun-1941, LOS: 2 ADMISSION DATE:  01/28/2024, CONSULTATION DATE:  01/28/2024 REFERRING MD:  Dr. Demetrios Finders, CHIEF COMPLAINT:  Shortness of Breath/Acute Respiratory Distress   Brief Pt Description / Synopsis:  83 y.o. female admitted with Acute Hypoxic Respiratory Failure and Severe Sepsis with Septic Shock due to Community Acquired Pneumonia, along with Acute decompensated HFrEF and Cardiogenic shock, and A. Fib w/ RVR.  Failed trial of BiPAP requiring intubation and mechanical ventilation.  History of Present Illness:  Tanya Frye is a 83 year old female with a past medical history significant for COPD, CHF,  hypertension, diabetes mellitus, and hard of hearing who presents to [facility] ED on 01/28/2024 due to shortness of breath.  Patient is currently unable to contribute to history due to acute respiratory distress, BiPAP, and being extremely hard of hearing, therefore history is obtained from chart review.  Per report she acutely developed respiratory distress earlier this morning.  Upon EMS arrival she was noted to be hypoxic with O2 saturations of 90% on room air which they placed her on CPAP en route to the hospital.  Given her respiratory distress upon arrival, she was transitioned to BiPAP.   ED Course: Initial Vital Signs: Temperature 96.9 F axillary, pulse 128, respiratory rate 33, blood pressure 146/119, SpO2 99% on BiPAP Significant Labs: Sodium 134, glucose 256, BUN 11, creatinine 1.1, BNP 251, lactic acid 4.8, high-sensitivity troponin 8, WBC 22.8 with neutrophilia VBG: pH 7.24/pCO2 51/pO2 58/bicarb 21.9 COVID-19/flu/RSV PCR is negative Imaging Chest X-ray>>IMPRESSION: 1. Mild cardiomegaly and vascular congestion. 2. Small bilateral pleural effusions and bibasilar atelectasis or infiltrate. Medications Administered: 2.5 L of LR boluses, IV azithromycin  and ceftriaxone , 0.5 mg IV Ativan , 4 mg morphine , 5 mg IV Cardizem   Following fluid  resuscitation in the ED, systolic blood pressure decreasing to the 70s and 80s requiring initiation of peripheral Levophed .  PCCM asked to admit for further workup and treatment.  Please see "Significant Hospital Events" section below for full detailed hospital course.   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   CHF (congestive heart failure) (HCC)    Diabetes mellitus without complication (HCC)    HOH (hard of hearing)    extremely HOH   Hypertension    Palpitations    occasional related to meds/heart races    Micro Data:  5/7: COVID/flu/RSV PCR>> negative 5/7: RVP>>negative 5/7: Blood culture x 2>>no growth to date 5/7: Tracheal aspirate>> unable to collect 5/7: MRSA PCR>>negative 5/7: Strep pneumo and Legionella urine antigens>>negative  Antimicrobials:   Anti-infectives (From admission, onward)    Start     Dose/Rate Route Frequency Ordered Stop   01/29/24 1800  ceFEPIme  (MAXIPIME ) 2 g in sodium chloride  0.9 % 100 mL IVPB        2 g 200 mL/hr over 30 Minutes Intravenous Every 12 hours 01/29/24 1341     01/29/24 1000  azithromycin  (ZITHROMAX ) 500 mg in sodium chloride  0.9 % 250 mL IVPB        500 mg 250 mL/hr over 60 Minutes Intravenous Every 24 hours 01/28/24 1343 02/02/24 0959   01/29/24 1000  cefTRIAXone  (ROCEPHIN ) 2 g in sodium chloride  0.9 % 100 mL IVPB  Status:  Discontinued        2 g 200 mL/hr over 30 Minutes Intravenous Every 24 hours 01/28/24 1343 01/29/24 1341   01/28/24 1000  cefTRIAXone  (ROCEPHIN )  2 g in sodium chloride  0.9 % 100 mL IVPB        2 g 200 mL/hr over 30 Minutes Intravenous Once 01/28/24 0954 01/28/24 1045   01/28/24 1000  azithromycin  (ZITHROMAX ) 500 mg in sodium chloride  0.9 % 250 mL IVPB        500 mg 250 mL/hr over 60 Minutes Intravenous  Once 01/28/24 0954 01/28/24 1135       Significant Hospital Events: Including procedures, antibiotic start and stop dates in  addition to other pertinent events   5/7: Presents to ED with Acute Respiratory Distress requiring BiPAP.  With A.fib w/ RVR requiring Amiodarone  bolus and drip, developing septic/cardiogenic shock requiring initiation of peripheral Levophed   PCCM asked to admit.  Required intubation later in the evening. 5/8: Remains critically ill with multiorgan failure,  on vent.  Requiring 3 vasopressors (Levo, vaso, Epi).  Cardiology and Palliative Care consulted.  Metabolic and lactic acidosis improving, stop Bicarb gtt.  Leukocytosis worsened, broaden Ceftriaxone  to Cefepime , cultures still pending.  Echo shows LVEF 25-30% with global hypokinesis . 5/9: Overnight unable to tolerate turning due to desaturations.   Remains critically ill on the vent, remains on 3 vasopressors along with Dobutamine .  Plan to obtain internal jugular/Subclavian access to be able to follow Coox, remove femoral lines. Renal function stable, leukocytosis improving.  Diurese with 60 mg IV Lasix  x1 dose, pleural effusions improved with diuresis therefore holding off on thoracentesis.  Interim History / Subjective:  As outlined above under "Significant Hospital Events" section  Objective   Blood pressure 108/71, pulse (!) 101, temperature 98.5 F (36.9 C), resp. rate 20, height 5\' 4"  (1.626 m), weight 92 kg, SpO2 95%. CVP:  [12 mmHg-19 mmHg] 14 mmHg  Vent Mode: PRVC FiO2 (%):  [40 %-45 %] 45 % Set Rate:  [20 bmp] 20 bmp Vt Set:  [450 mL] 450 mL PEEP:  [5 cmH20] 5 cmH20 Plateau Pressure:  [19 cmH20-22 cmH20] 22 cmH20   Intake/Output Summary (Last 24 hours) at 01/30/2024 1559 Last data filed at 01/30/2024 1250 Gross per 24 hour  Intake 2543.3 ml  Output 1010 ml  Net 1533.3 ml   Filed Weights   01/28/24 1545 01/29/24 0445 01/30/24 0424  Weight: 89.4 kg 91.9 kg 92 kg    Examination: General: Critically ill-appearing obese female, laying in bed, intubated and sedated in NAD HENT: Atraumatic, normocephalic, neck supple, difficult  to assess JVD due to body habitus, orally intubated Lungs: Coarse breath sounds with mild expiratory wheezing throughout, even, synchronous with the vent Cardiovascular: Tachycardia, irregular irregular rhythm, no murmurs, rubs, gallops Abdomen: Obese, soft, nontender, no guarding or rebound tenderness, bowel sounds positive x 4 Extremities: Chronic trophic changes to bilateral lower extremities Neuro: Sedated, currently does not withdraw from pain or follow commands, pupils PERRL 1 mm sluggish bilaterally GU: Foley catheter in place  Resolved Hospital Problem list     Assessment & Plan:   #Shock: Septic +/- Cardiogenic #Atrial Fibrillation with RVR #Acute Decompensated HFrEF  #Elevated Troponin in setting of demand ischemia vs NSTEMI PMHx: HTN, palpitations Echocardiogram 01/29/24: LVEF 25-30%, LV with global hypokinesis with severe hypokinesis of the basal to mid anterior/anteroseptal wall.  Diastolic parameters indeterminate. RV systolic function normal.  RV size normal.  Mild to moderate mitral regurgitation -Continuous cardiac monitoring -Maintain MAP >65 -Cautious IV fluids -Vasopressors as needed to maintain MAP goal -Trend lactic acid until normalized -Trend HS Troponin until peaked -Follow Coox panel ~ continue Dobutamine  -Diuresis as BP and renal  function permits ~ give 60 mg IV Lasix  x1 dose on 5/9 -Continue Amiodarone  & Heparin  drips -TSH normal -Cardiology following, appreciate input  #Meets SIRS Criteria (HR 130's, RR 30's, WBC 22.8) #Severe Sepsis due to ... #Community Acquired Pneumonia CTa Abdomen & Pelvis with diffuse fatty liver changes -Monitor fever curve -Trend WBC's & Procalcitonin -Follow cultures as above -Continue empiric Azithromycin  and Cefepime  pending cultures & sensitivities  #Acute Hypoxic Respiratory Failure due to ... #Community Acquired Pneumonia #Acute COPD Exacerbation #Acute Decompensated HFrEF PMHx: COPD CTa Chest negative for PE, with  medium to large bilateral pleural effusions with basilar infiltrates and atelectasis (edema vs superimposed pneumonia) -Full vent support, implement lung protective strategies -Plateau pressures less than 30 cm H20 -Wean FiO2 & PEEP as tolerated to maintain O2 sats 88 to 92% -Follow intermittent Chest X-ray & ABG as needed -Spontaneous Breathing Trials when respiratory parameters met and mental status permits -Implement VAP Bundle -Bronchodilators -IV steroids -ABX as above -Diuresis as BP and renal function permits ~ give 60 mg IV Lasix  x1 dose on 5/9  #Acute Kidney Injury #Anion Gap Metabolic Acidosis due to Lactic Acidosis -Monitor I&O's / urinary output -Follow BMP -Ensure adequate renal perfusion -Avoid nephrotoxic agents as able -Replace electrolytes as indicated ~ Pharmacy following for assistance with electrolyte replacement -Stop Bicarb gtt 5/8  #Diabetes Mellitus  Hgb A1c is 6.6 -CBG's q4h; Target range of 140 to 180 -SSI -Follow ICU Hypo/Hyperglycemia protocol  #Acute Metabolic Encephalopathy #Sedation needs in setting of mechanical ventilation PMHx: HOH -Treatment of Sepsis and metabolic derangements as outlined above -Maintain a RASS goal of 0 to -1 -Fentanyl  and Precedex  as needed to maintain RASS goal -Avoid sedating medications as able -Daily wake up assessment      Pt is critically ill with multiorgan failure. Prognosis is guarded, high risk for further decompensation, cardiac arrest, and death.  Given current critical illness superimposed on multiple chronic co-morbidities and advanced age, overall long term prognosis is poor.  Recommend consideration of DNR/DNI status.  Palliative Care following to assist with GOC discussions.   Best Practice (right click and "Reselect all SmartList Selections" daily)   Diet/type: NPO, tube feeds DVT prophylaxis: Heparin  gtt GI prophylaxis: PPI Lines: Right Femoral CVC and A-line, both still needed Foley:  yes, and  is still needed Code Status:  full code Last date of multidisciplinary goals of care discussion [5/9]  5/9: Updated pt's daughter-in-law [name]  at bedside on plan of care.  Labs   CBC: Recent Labs  Lab 01/28/24 0914 01/29/24 0401 01/30/24 0159  WBC 22.8* 28.1* 23.4*  NEUTROABS 16.8*  --   --   HGB 13.1 12.3 11.3*  HCT 42.7 39.6 34.9*  MCV 87.5 85.7 82.9  PLT 495* 421* 244    Basic Metabolic Panel: Recent Labs  Lab 01/28/24 0914 01/28/24 1747 01/28/24 2138 01/29/24 0401 01/30/24 0159  NA 134*  --  134* 136 134*  K 3.8  --  4.2 4.1 3.5  CL 98  --  97* 96* 95*  CO2 22  --  12* 17* 24  GLUCOSE 256*  --  376* 342* 283*  BUN 11  --  16 17 24*  CREATININE 1.11*  --  1.59* 1.47* 1.43*  CALCIUM 8.8*  --  7.9* 7.8* 7.4*  MG  --  1.6*  --  2.0 1.9  PHOS  --  8.4*  --  5.6* 4.6   GFR: Estimated Creatinine Clearance: 33.9 mL/min (A) (by C-G formula based on  SCr of 1.43 mg/dL (H)). Recent Labs  Lab 01/28/24 0914 01/28/24 1422 01/29/24 0401 01/29/24 0745 01/29/24 1140 01/29/24 2001 01/30/24 0159  PROCALCITON <0.10  --   --   --   --   --   --   WBC 22.8*  --  28.1*  --   --   --  23.4*  LATICACIDVEN 4.8*   < > 7.6* 5.5* 3.8* 2.1*  --    < > = values in this interval not displayed.    Liver Function Tests: Recent Labs  Lab 01/28/24 0914 01/29/24 0401 01/30/24 0159  AST 28 51*  --   ALT 20 38  --   ALKPHOS 29* 24*  --   BILITOT 0.6 0.9  --   PROT 7.6 6.1*  --   ALBUMIN 4.0 3.1*  3.1* 2.9*   No results for input(s): "LIPASE", "AMYLASE" in the last 168 hours. No results for input(s): "AMMONIA" in the last 168 hours.  ABG    Component Value Date/Time   PHART 7.38 01/30/2024 0345   PCO2ART 35 01/30/2024 0345   PO2ART 70 (L) 01/30/2024 0345   HCO3 20.7 01/30/2024 0345   ACIDBASEDEF 3.8 (H) 01/30/2024 0345   O2SAT 97.7 01/30/2024 1500     Coagulation Profile: Recent Labs  Lab 01/28/24 0914  INR 1.0    Cardiac Enzymes: No results for input(s):  "CKTOTAL", "CKMB", "CKMBINDEX", "TROPONINI" in the last 168 hours.  HbA1C: HbA1c POC (<> result, manual entry)  Date/Time Value Ref Range Status  04/03/2021 03:07 PM 7.1 4.0 - 5.6 % Final   Hgb A1c MFr Bld  Date/Time Value Ref Range Status  01/30/2024 01:59 AM 6.7 (H) 4.8 - 5.6 % Final    Comment:    (NOTE) Pre diabetes:          5.7%-6.4%  Diabetes:              >6.4%  Glycemic control for   <7.0% adults with diabetes   01/29/2024 04:01 AM 6.6 (H) 4.8 - 5.6 % Final    Comment:    (NOTE) Pre diabetes:          5.7%-6.4%  Diabetes:              >6.4%  Glycemic control for   <7.0% adults with diabetes     CBG: Recent Labs  Lab 01/29/24 1927 01/29/24 2321 01/30/24 0300 01/30/24 0740 01/30/24 1210  GLUCAP 160* 288* 277* 256* 209*    Review of Systems:   Unable to assess due to intubation/sedation/critical illness   Past Medical History:  She,  has a past medical history of Cancer (HCC), CHF (congestive heart failure) (HCC), Diabetes mellitus without complication (HCC), HOH (hard of hearing), Hypertension, and Palpitations.   Surgical History:   Past Surgical History:  Procedure Laterality Date   ABDOMINAL HYSTERECTOMY  1990   CATARACT EXTRACTION W/PHACO Right 12/20/2020   Procedure: CATARACT EXTRACTION PHACO AND INTRAOCULAR LENS PLACEMENT (IOC) RIGHT DIABETIC 8.13 01:17.6 10.5%;  Surgeon: Annell Kidney, MD;  Location: [facility];  Service: Ophthalmology;  Laterality: Right;   CATARACT EXTRACTION W/PHACO Left 01/03/2021   Procedure: CATARACT EXTRACTION PHACO AND INTRAOCULAR LENS PLACEMENT (IOC) LEFT DIABETIC  7.37 01:15.6 9.8%;  Surgeon: Annell Kidney, MD;  Location: [facility];  Service: Ophthalmology;  Laterality: Left;   CHOLECYSTECTOMY     DILATION AND CURETTAGE OF UTERUS  before 1990   X2   NECK SURGERY  03/30/2008   Per family, pt broke  her neck   STOMACH SURGERY     tumor removed, per family     Social History:    reports that she has never smoked. She has never used smokeless tobacco. She reports that she does not drink alcohol and does not use drugs.   Family History:  Her family history is not on file.   Allergies Allergies  Allergen Reactions   Penicillins Rash     Home Medications  Prior to Admission medications   Medication Sig Start Date End Date Taking? Authorizing Provider  aspirin  EC 81 MG tablet Take 1 tablet by mouth daily.    [provider]  Calcium Carbonate-Vit D-Min (CALCIUM 1200 PO) Take by mouth.    [provider]  furosemide  (LASIX ) 40 MG tablet TAKE 1 TABLET BY MOUTH EVERY DAY 08/27/21   Theron Flavin, MD  glipiZIDE  (GLUCOTROL  XL) 5 MG 24 hr tablet Take 3 tablets (15 mg total) by mouth daily. 11/13/21   Masoud, Javed, MD  levothyroxine (SYNTHROID, LEVOTHROID) 88 MCG tablet Take 88 mcg by mouth every morning. 12/09/17   [provider]  loratadine  (CLARITIN ) 10 MG tablet TAKE 1 TABLET BY MOUTH EVERY DAY 09/24/22   Theron Flavin, MD  losartan  (COZAAR ) 50 MG tablet TAKE 1 TABLET BY MOUTH EVERY DAY 06/10/22   Masoud, Javed, MD  lovastatin (MEVACOR) 10 MG tablet TAKE 1 TABLET ONCE A DAY ORALLY 10/20/17   [provider]  metFORMIN  (GLUCOPHAGE ) 1000 MG tablet TAKE 1 TABLET BY MOUTH TWICE A DAY 12/20/20   Theron Flavin, MD  metoprolol  succinate (TOPROL -XL) 50 MG 24 hr tablet TAKE 1 TABLET BY MOUTH DAILY. TAKE WITH OR IMMEDIATELY FOLLOWING A MEAL. 08/23/22   Theron Flavin, MD  Omega-3 Fatty Acids (FISH OIL) 500 MG CAPS Take 1,000 mg by mouth daily.    [provider]  omeprazole  (PRILOSEC) 20 MG capsule TAKE 1 CAPSULE BY MOUTH EVERY DAY 06/04/21   Theron Flavin, MD     Critical care time: 40 minutes     Cherylann Corpus, AGACNP-BC [facility] Prefer epic messenger for cross cover needs If after hours, please call E-link

## 2024-01-30 NOTE — Inpatient Diabetes Management (Signed)
 Inpatient Diabetes Program Recommendations  AACE/ADA: New Consensus Statement on Inpatient Glycemic Control (2015)  Target Ranges:  Prepandial:   less than 140 mg/dL      Peak postprandial:   less than 180 mg/dL (1-2 hours)      Critically ill patients:  140 - 180 mg/dL    Latest Reference Range & Units 01/29/24 10:56 01/29/24 11:57 01/29/24 14:23 01/29/24 15:23 01/29/24 16:30 01/29/24 19:27 01/29/24 23:21 01/30/24 03:00 01/30/24 07:40  Glucose-Capillary 70 - 99 mg/dL 161 (H) 096 (H) 045 (H)  10 units Semglee  143 (H) 125 (H)  IV Insulin  Drip Stopped 160 (H)  4 units Novolog   288 (H)  11 units Novolog   277 (H)  16 units Novolog   256 (H)  16 units Novolog    (H): Data is abnormally high    Admit with: Acute Hypoxic Respiratory Failure and Severe Sepsis with Septic Shock due to Community Acquired Pneumonia, along with concern for Acute decompensated CHF and Cardiogenic shock, along with A. Fib w/ RVR   History: DM, CHF   Home DM Meds: Glipizide  15 mg daily                             Metformin  1000 mg BID   Current Orders: Novolog  Resistant Correction Scale/ SSI (0-20 units) Q4 hours     Novolog  5 units Q4H     Solucortef 100 mg BID Tube Feeds Running 20cc/hr (goal rate 55cc/hr)   MD- Please consider:  1. Start weight based basal insulin  while pt getting Steroids:  Semglee  7 units BID (0.15 units/kg split dose)  2. May also consider increasing the Novolog  Tube Feed Coverage to 7 units Q4 hours     --Will follow patient during hospitalization--  Langston Pippins RN, MSN, CDCES Diabetes Coordinator Inpatient Glycemic Control Team Team Pager: [phone] (8a-5p)

## 2024-01-30 NOTE — Progress Notes (Signed)
 PHARMACY CONSULT NOTE - ELECTROLYTES  Pharmacy Consult for Electrolyte Monitoring and Replacement   Recent Labs: Height: 5\' 4"  (162.6 cm) Weight: 92 kg (202 lb 13.2 oz) IBW/kg (Calculated) : 54.7 Estimated Creatinine Clearance: 33.9 mL/min (A) (by C-G formula based on SCr of 1.43 mg/dL (H)). Potassium (mmol/L)  Date Value  01/30/2024 3.5   Magnesium  (mg/dL)  Date Value  40/98/1191 1.9   Calcium (mg/dL)  Date Value  47/82/9562 7.4 (L)   Albumin (g/dL)  Date Value  13/04/6577 2.9 (L)   Phosphorus (mg/dL)  Date Value  46/96/2952 4.6   Sodium (mmol/L)  Date Value  01/30/2024 134 (L)   Assessment  Tanya Frye is a 83 y.o. female presenting with septic shock 2/2 CAP. PMH significant for COPD, CHF,  hypertension, T2DM. Pharmacy has been consulted to monitor and replace electrolytes.  Diet: NPO Pertinent medications: Amiodarone , epinephrine , norepinephrine , vasopressin , dobutamine , digoxin   Goal of Therapy: Electrolytes WNL  Plan:  K 3.5, Kcl 40 mEq per tube x 1 dose Mg 1.9, magnesium  sulfate 2 g IV x 1 dose Re-check labs tomorrow AM  Thank you for allowing pharmacy to be a part of this patient's care.  Page Boast 01/30/2024 7:52 AM

## 2024-01-30 NOTE — Progress Notes (Signed)
 Rounding Note    Patient Name: Tanya Frye Date of Encounter: 01/30/2024  [facility] Cardiologist: Belva Boyden, MD   Subjective   She continues to be in atrial fibrillation but ventricular rate is more controlled.  She is still intubated.  Inpatient Medications    Scheduled Meds:  aspirin   81 mg Per Tube Daily   Chlorhexidine  Gluconate Cloth  6 each Topical Daily   docusate  100 mg Per Tube BID   free water   30 mL Per Tube Q4H   hydrocortisone  sod succinate (SOLU-CORTEF ) inj  100 mg Intravenous Q12H   insulin  aspart  0-20 Units Subcutaneous Q4H   insulin  aspart  7 Units Subcutaneous Q4H   insulin  glargine-yfgn  7 Units Subcutaneous BID   multivitamin with minerals  1 tablet Per Tube Daily   mouth rinse  15 mL Mouth Rinse Q2H   pantoprazole  (PROTONIX ) IV  40 mg Intravenous Daily   polyethylene glycol  17 g Per Tube Daily   Continuous Infusions:  amiodarone  30 mg/hr (01/30/24 1633)   azithromycin  Stopped (01/30/24 1210)   ceFEPime  (MAXIPIME ) IV Stopped (01/30/24 0981)   dexmedetomidine  (PRECEDEX ) IV infusion Stopped (01/30/24 1513)   dextrose      DOBUTamine  5 mcg/kg/min (01/30/24 1633)   feeding supplement (VITAL HIGH PROTEIN) 20 mL/hr at 01/30/24 1633   fentaNYL  infusion INTRAVENOUS 200 mcg/hr (01/30/24 1633)   heparin  1,000 Units/hr (01/30/24 1633)   midazolam  2 mg/hr (01/30/24 1633)   norepinephrine  (LEVOPHED ) Adult infusion 17 mcg/min (01/30/24 1633)   promethazine (PHENERGAN) injection (IM or IVPB)     vasopressin  0.04 Units/min (01/30/24 1633)   PRN Meds: dextrose , dextrose , docusate sodium , fentaNYL , midazolam , ondansetron  (ZOFRAN ) IV, mouth rinse, polyethylene glycol, promethazine (PHENERGAN) injection (IM or IVPB)   Vital Signs    Vitals:   01/30/24 1615 01/30/24 1617 01/30/24 1630 01/30/24 1645  BP: 119/70 119/70 112/65 114/64  Pulse: (!) 134 (!) 117 (!) 127 (!) 120  Resp: 20 20 20 20   Temp:      TempSrc:      SpO2: 97% 96% 96% 96%   Weight:      Height:        Intake/Output Summary (Last 24 hours) at 01/30/2024 1706 Last data filed at 01/30/2024 1633 Gross per 24 hour  Intake 2742.19 ml  Output 1445 ml  Net 1297.19 ml      01/30/2024    4:24 AM 01/29/2024    4:45 AM 01/28/2024    3:45 PM  Last 3 Weights  Weight (lbs) 202 lb 13.2 oz 202 lb 9.6 oz 197 lb 1.5 oz  Weight (kg) 92 kg 91.9 kg 89.4 kg      Telemetry    Atrial fibrillation with ventricular rate between 80 and 100- Personally Reviewed  ECG     - Personally Reviewed  Physical Exam   GEN: Critically ill-appearing on the ventilator. Neck: Jugular venous pressure is not well-visualized Cardiac: Irregular with no murmurs., no  rubs, or gallops.  Respiratory: Coarse breath sounds GI: Soft, nontender, non-distended  MS: No edema; No deformity.   Labs    High Sensitivity Troponin:   Recent Labs  Lab 01/29/24 1140 01/29/24 1610 01/29/24 2001 01/29/24 2250 01/30/24 0159  TROPONINIHS 996* 1,032* 855* 685* 661*     Chemistry Recent Labs  Lab 01/28/24 0914 01/28/24 1747 01/28/24 2138 01/29/24 0401 01/30/24 0159  NA 134*  --  134* 136 134*  K 3.8  --  4.2 4.1 3.5  CL 98  --  97* 96* 95*  CO2 22  --  12* 17* 24  GLUCOSE 256*  --  376* 342* 283*  BUN 11  --  16 17 24*  CREATININE 1.11*  --  1.59* 1.47* 1.43*  CALCIUM 8.8*  --  7.9* 7.8* 7.4*  MG  --  1.6*  --  2.0 1.9  PROT 7.6  --   --  6.1*  --   ALBUMIN 4.0  --   --  3.1*  3.1* 2.9*  AST 28  --   --  51*  --   ALT 20  --   --  38  --   ALKPHOS 29*  --   --  24*  --   BILITOT 0.6  --   --  0.9  --   GFRNONAA 50*  --  32* 36* 37*  ANIONGAP 14  --  25* 23* 15    Lipids No results for input(s): "CHOL", "TRIG", "HDL", "LABVLDL", "LDLCALC", "CHOLHDL" in the last 168 hours.  Hematology Recent Labs  Lab 01/28/24 0914 01/29/24 0401 01/30/24 0159  WBC 22.8* 28.1* 23.4*  RBC 4.88 4.62 4.21  HGB 13.1 12.3 11.3*  HCT 42.7 39.6 34.9*  MCV 87.5 85.7 82.9  MCH 26.8 26.6 26.8  MCHC  30.7 31.1 32.4  RDW 15.9* 15.9* 16.0*  PLT 495* 421* 244   Thyroid  Recent Labs  Lab 01/30/24 0159  TSH 0.443    BNP Recent Labs  Lab 01/28/24 0914  BNP 251.9*    DDimer No results for input(s): "DDIMER" in the last 168 hours.   Radiology    DG Chest Port 1 View Result Date: 01/30/2024 CLINICAL DATA:  Central line placement. EXAM: PORTABLE CHEST 1 VIEW COMPARISON:  Earlier today. FINDINGS: Right subclavian central line tip is in the right atrium. No pneumothorax. Endotracheal and enteric tubes are unchanged in position. Increasing hazy opacity at the lung bases likely pleural effusions and atelectasis. Vascular congestion. Stable heart size and mediastinal contours. IMPRESSION: 1. Right subclavian central line tip in the right atrium. No pneumothorax. 2. Increasing hazy opacity at the lung bases likely pleural effusions and atelectasis. Electronically Signed   By: Chadwick Colonel M.D.   On: 01/30/2024 15:23   DG Chest Port 1 View Result Date: 01/30/2024 CLINICAL DATA:  Acute respiratory failure with hypoxia [facility]) [id number] EXAM: PORTABLE CHEST 1 VIEW COMPARISON:  01/28/2024 FINDINGS: Endotracheal tube is 1.9 cm above the carina. Nasogastric tube extends into the abdomen but the tip is beyond the image. Cardiac pad was removed from the left chest. Increased densities in the mid and lower lungs. Patient is rotated towards the left. Difficult to assess cardiac silhouette size. Negative for a pneumothorax. IMPRESSION: 1. Increased densities in the mid and lower lungs. Findings are suggestive for increased atelectasis and possibly bilateral pleural effusions. Electronically Signed   By: Elene Griffes M.D.   On: 01/30/2024 08:56   ECHOCARDIOGRAM COMPLETE Result Date: 01/29/2024    ECHOCARDIOGRAM REPORT   Patient Name:   Tanya Frye Date of Exam: 01/29/2024 Medical Rec #:  [id number]    Height:       64.0 in Accession #:    [id number]   Weight:       202.6 lb Date of Birth:  08-Jan-1941    BSA:           1.967 m Patient Age:    82 years     BP:  Not listed in chart/Not listed in                                            chart mmHg Patient Gender: F            HR:           97 bpm. Exam Location:  [facility] Procedure: 2D Echo, Cardiac Doppler and Color Doppler (Both Spectral and Color            Flow Doppler were utilized during procedure). STAT ECHO Indications:     Acute respiratory distress R06.03  History:         Patient has no prior history of Echocardiogram examinations.                  CHF; Risk Factors:Diabetes and Hypertension.  Sonographer:     Broadus Canes Referring Phys:  [id number] [name] Diagnosing Phys: Belva Boyden MD  Sonographer Comments: Echo performed with patient supine and on artificial respirator, no apical window and suboptimal subcostal window. IMPRESSIONS  1. Left ventricular ejection fraction, by estimation, is 25 to 30%. The left ventricle has severely decreased function. The left ventricle demonstrates global hypokinesis with severe hypokinesis of teh basal to mid anterior/anteroseptal wall (apical region not well visualized). The left ventricular internal cavity size was mildly dilated. Left ventricular diastolic parameters are indeterminate.  2. Right ventricular systolic function is normal. The right ventricular size is normal. There is normal pulmonary artery systolic pressure. The estimated right ventricular systolic pressure is 34.4 mmHg.  3. The mitral valve is normal in structure. Mild to moderate mitral valve regurgitation. No evidence of mitral stenosis. Moderate mitral annular calcification.  4. The aortic valve is normal in structure. There is moderate calcification of the aortic valve. Aortic valve regurgitation is not visualized. Aortic valve sclerosis/calcification is present, unable to exclude stenosis (gradient not measured.  5. The inferior vena cava is normal in size with greater than 50% respiratory variability, suggesting right atrial pressure of 3  mmHg. FINDINGS  Left Ventricle: Left ventricular ejection fraction, by estimation, is 25 to 30%. The left ventricle has severely decreased function. The left ventricle demonstrates global hypokinesis. Strain was performed and the global longitudinal strain is indeterminate. The left ventricular internal cavity size was mildly dilated. There is no left ventricular hypertrophy. Left ventricular diastolic parameters are indeterminate. Right Ventricle: The right ventricular size is normal. No increase in right ventricular wall thickness. Right ventricular systolic function is normal. There is normal pulmonary artery systolic pressure. The tricuspid regurgitant velocity is 2.71 m/s, and  with an assumed right atrial pressure of 5 mmHg, the estimated right ventricular systolic pressure is 34.4 mmHg. Left Atrium: Left atrial size was normal in size. Right Atrium: Right atrial size was normal in size. Pericardium: There is no evidence of pericardial effusion. Mitral Valve: The mitral valve is normal in structure. There is mild calcification of the mitral valve leaflet(s). Moderate mitral annular calcification. Mild to moderate mitral valve regurgitation. No evidence of mitral valve stenosis. Tricuspid Valve: The tricuspid valve is normal in structure. Tricuspid valve regurgitation is mild . No evidence of tricuspid stenosis. Aortic Valve: The aortic valve is normal in structure. There is moderate calcification of the aortic valve. Aortic valve regurgitation is not visualized. Aortic valve sclerosis/calcification is present, without any evidence of aortic stenosis. Pulmonic Valve: The pulmonic  valve was normal in structure. Pulmonic valve regurgitation is not visualized. No evidence of pulmonic stenosis. Aorta: The aortic root is normal in size and structure. Venous: The inferior vena cava is normal in size with greater than 50% respiratory variability, suggesting right atrial pressure of 3 mmHg. IAS/Shunts: No atrial level  shunt detected by color flow Doppler. Additional Comments: 3D was performed not requiring image post processing on an independent workstation and was indeterminate.  LEFT VENTRICLE PLAX 2D LVIDd:         4.85 cm LVIDs:         4.15 cm LV PW:         0.85 cm LV IVS:        1.00 cm LVOT diam:     2.00 cm LVOT Area:     3.14 cm  LEFT ATRIUM         Index LA diam:    4.60 cm 2.34 cm/m   AORTA Ao Root diam: 2.60 cm TRICUSPID VALVE TR Peak grad:   29.4 mmHg TR Vmax:        271.00 cm/s  SHUNTS Systemic Diam: 2.00 cm Belva Boyden MD Electronically signed by Belva Boyden MD Signature Date/Time: 01/29/2024/2:52:49 PM    Final    DG Abd 1 View Result Date: 01/29/2024 CLINICAL DATA:  NG placement. EXAM: ABDOMEN - 1 VIEW COMPARISON:  Abdominal radiograph dated 01/28/2024. FINDINGS: Partially visualized enteric tube with tip and side port in the proximal stomach. IMPRESSION: Enteric tube with tip and side port in the proximal stomach. Electronically Signed   By: Angus Bark M.D.   On: 01/29/2024 11:52   DG Chest Port 1 View Result Date: 01/28/2024 CLINICAL DATA:  Intubation EXAM: PORTABLE CHEST 1 VIEW COMPARISON:  Chest x-ray 01/28/2024 FINDINGS: Endotracheal tube tip is 2 cm above the carina. Enteric tube extends below the diaphragm. The heart is enlarged. There are small bilateral pleural effusions. There is central pulmonary vascular congestion. There is no pneumothorax. The heart is enlarged. No acute fractures are seen. There is no focal lung infiltrate. IMPRESSION: 1. Endotracheal tube tip is 2 cm above the carina. 2. Cardiomegaly with central pulmonary vascular congestion and small bilateral pleural effusions. Electronically Signed   By: Tyron Gallon M.D.   On: 01/28/2024 19:52   DG Abd 1 View Result Date: 01/28/2024 CLINICAL DATA:  Check gastric catheter placement EXAM: ABDOMEN - 1 VIEW COMPARISON:  None Available. FINDINGS: Gastric catheter is noted within the stomach. Scattered bowel gas is seen. No free  air is noted. IMPRESSION: Gastric catheter within the stomach. Electronically Signed   By: Violeta Grey M.D.   On: 01/28/2024 19:46    Cardiac Studies   Echocardiogram done yesterday showed an EF of 25 to 30% with wall motion abnormality in the anteroseptal and anterior wall.  There was mild to moderate mitral regurgitation.  I personally reviewed the echocardiogram images.  Patient Profile     83 y.o. female with history of COPD, chronic diastolic heart failure, essential hypertension, type 2 diabetes and previous gastric cancer who presented with respiratory distress and was found to have new heart failure and A-fib with RVR  Assessment & Plan    1.  Acute systolic heart failure with an EF of 25 to 30%: Wall motion abnormalities is concerning for underlying ischemic cardiomyopathy and likely prior LAD infarct.  Less likely to be stress-induced cardiomyopathy.  She did receive IV furosemide  and her CVP improved from 19 down to the 7-9 range.  Her acidosis and perfusion seems to have improved on dobutamine .  2.  Atrial fibrillation, newly diagnosed: I agree with amiodarone  drip for rate control given lack of alternative options.  Continue anticoagulation with heparin .  3.  Elevated troponin with a peak of 855, could be due to supply demand mismatch but that likely has underlying coronary artery disease as well based on wall motion abnormality on echo.  If she continues to improve at this rate, we will plan on a right and left cardiac catheterization next week.       For questions or updates, please contact [facility] Please consult [email or web address] for contact info under        Signed, Antionette Kirks, MD  01/30/2024, 5:06 PM

## 2024-01-31 DIAGNOSIS — Z515 Encounter for palliative care: Secondary | ICD-10-CM

## 2024-01-31 DIAGNOSIS — Z789 Other specified health status: Secondary | ICD-10-CM | POA: Diagnosis not present

## 2024-01-31 DIAGNOSIS — J9601 Acute respiratory failure with hypoxia: Secondary | ICD-10-CM | POA: Diagnosis not present

## 2024-01-31 DIAGNOSIS — J90 Pleural effusion, not elsewhere classified: Secondary | ICD-10-CM | POA: Diagnosis not present

## 2024-01-31 DIAGNOSIS — A419 Sepsis, unspecified organism: Secondary | ICD-10-CM | POA: Diagnosis not present

## 2024-01-31 DIAGNOSIS — R579 Shock, unspecified: Secondary | ICD-10-CM | POA: Diagnosis not present

## 2024-01-31 DIAGNOSIS — N179 Acute kidney failure, unspecified: Secondary | ICD-10-CM | POA: Diagnosis not present

## 2024-01-31 LAB — COOXEMETRY PANEL
Carboxyhemoglobin: 0.5 % (ref 0.5–1.5)
Carboxyhemoglobin: 1.4 % (ref 0.5–1.5)
Carboxyhemoglobin: 1.4 % (ref 0.5–1.5)
Methemoglobin: <0.7 % (ref 0.0–1.5)
Methemoglobin: 1.1 % (ref 0.0–1.5)
Methemoglobin: <0.7 % (ref 0.0–1.5)
O2 Saturation: 46.1 %
O2 Saturation: 57.6 %
O2 Saturation: 98.5 %
Total hemoglobin: 10.6 g/dL — ABNORMAL LOW (ref 12.0–16.0)
Total hemoglobin: 10.3 g/dL — ABNORMAL LOW (ref 12.0–16.0)
Total hemoglobin: 10.4 g/dL — ABNORMAL LOW (ref 12.0–16.0)
Total oxygen content: 45.7 %
Total oxygen content: 56.2 %
Total oxygen content: 97 %

## 2024-01-31 LAB — CBC
HCT: 32.5 % — ABNORMAL LOW (ref 36.0–46.0)
Hemoglobin: 10.4 g/dL — ABNORMAL LOW (ref 12.0–15.0)
MCH: 27.4 pg (ref 26.0–34.0)
MCHC: 32 g/dL (ref 30.0–36.0)
MCV: 85.8 fL (ref 80.0–100.0)
Platelets: 197 10*3/uL (ref 150–400)
RBC: 3.79 MIL/uL — ABNORMAL LOW (ref 3.87–5.11)
RDW: 16.1 % — ABNORMAL HIGH (ref 11.5–15.5)
WBC: 18.7 10*3/uL — ABNORMAL HIGH (ref 4.0–10.5)
nRBC: 0 % (ref 0.0–0.2)

## 2024-01-31 LAB — URINALYSIS, COMPLETE (UACMP) WITH MICROSCOPIC
Bilirubin Urine: NEGATIVE
Glucose, UA: NEGATIVE mg/dL
Hgb urine dipstick: NEGATIVE
Ketones, ur: 5 mg/dL — AB
Leukocytes,Ua: NEGATIVE
Nitrite: NEGATIVE
Protein, ur: 100 mg/dL — AB
Specific Gravity, Urine: 1.039 — ABNORMAL HIGH (ref 1.005–1.030)
pH: 5 (ref 5.0–8.0)

## 2024-01-31 LAB — RENAL FUNCTION PANEL
Albumin: 2.8 g/dL — ABNORMAL LOW (ref 3.5–5.0)
Anion gap: 11 (ref 5–15)
BUN: 26 mg/dL — ABNORMAL HIGH (ref 8–23)
CO2: 27 mmol/L (ref 22–32)
Calcium: 7.5 mg/dL — ABNORMAL LOW (ref 8.9–10.3)
Chloride: 95 mmol/L — ABNORMAL LOW (ref 98–111)
Creatinine, Ser: 1.2 mg/dL — ABNORMAL HIGH (ref 0.44–1.00)
GFR, Estimated: 45 mL/min — ABNORMAL LOW (ref >60)
Glucose, Bld: 228 mg/dL — ABNORMAL HIGH (ref 70–99)
Phosphorus: 3.7 mg/dL (ref 2.5–4.6)
Potassium: 3.3 mmol/L — ABNORMAL LOW (ref 3.5–5.1)
Sodium: 133 mmol/L — ABNORMAL LOW (ref 135–145)

## 2024-01-31 LAB — HEPARIN LEVEL (UNFRACTIONATED)
Heparin Unfractionated: 0.37 [IU]/mL (ref 0.30–0.70)
Heparin Unfractionated: 0.4 [IU]/mL (ref 0.30–0.70)

## 2024-01-31 LAB — VITAMIN D 25 HYDROXY (VIT D DEFICIENCY, FRACTURES): Vit D, 25-Hydroxy: 81.13 ng/mL (ref 30–100)

## 2024-01-31 LAB — POTASSIUM: Potassium: 3.7 mmol/L (ref 3.5–5.1)

## 2024-01-31 LAB — LEGIONELLA PNEUMOPHILA SEROGP 1 UR AG: L. pneumophila Serogp 1 Ur Ag: NEGATIVE

## 2024-01-31 LAB — GLUCOSE, CAPILLARY
Glucose-Capillary: 237 mg/dL — ABNORMAL HIGH (ref 70–99)
Glucose-Capillary: 198 mg/dL — ABNORMAL HIGH (ref 70–99)
Glucose-Capillary: 241 mg/dL — ABNORMAL HIGH (ref 70–99)
Glucose-Capillary: 211 mg/dL — ABNORMAL HIGH (ref 70–99)
Glucose-Capillary: 172 mg/dL — ABNORMAL HIGH (ref 70–99)

## 2024-01-31 LAB — MAGNESIUM: Magnesium: 2.1 mg/dL (ref 1.7–2.4)

## 2024-01-31 LAB — LACTIC ACID, PLASMA: Lactic Acid, Venous: 1.5 mmol/L (ref 0.5–1.9)

## 2024-01-31 MED ORDER — LEVOTHYROXINE SODIUM 88 MCG PO TABS
88.0000 ug | ORAL_TABLET | Freq: Every day | ORAL | Status: DC
  Administered 2024-01-31 – 2024-02-02 (×3): 88 ug
  Filled 2024-01-31 (×4): qty 1

## 2024-01-31 MED ORDER — INSULIN GLARGINE-YFGN 100 UNIT/ML ~~LOC~~ SOLN
10.0000 [IU] | Freq: Two times a day (BID) | SUBCUTANEOUS | Status: DC
  Administered 2024-01-31 – 2024-02-13 (×26): 10 [IU] via SUBCUTANEOUS
  Filled 2024-01-31 (×28): qty 0.1

## 2024-01-31 MED ORDER — INSULIN ASPART 100 UNIT/ML IJ SOLN
9.0000 [IU] | INTRAMUSCULAR | Status: DC
  Administered 2024-01-31 – 2024-02-02 (×16): 9 [IU] via SUBCUTANEOUS
  Filled 2024-01-31 (×16): qty 1

## 2024-01-31 MED ORDER — QUETIAPINE FUMARATE 25 MG PO TABS
25.0000 mg | ORAL_TABLET | Freq: Two times a day (BID) | ORAL | Status: DC
  Administered 2024-01-31 – 2024-02-07 (×14): 25 mg
  Filled 2024-01-31 (×15): qty 1

## 2024-01-31 MED ORDER — HYDROCORTISONE SOD SUC (PF) 100 MG IJ SOLR
50.0000 mg | Freq: Two times a day (BID) | INTRAMUSCULAR | Status: DC
  Administered 2024-01-31: 50 mg via INTRAVENOUS
  Filled 2024-01-31: qty 2

## 2024-01-31 MED ORDER — FUROSEMIDE 10 MG/ML IJ SOLN
40.0000 mg | Freq: Once | INTRAMUSCULAR | Status: AC
  Administered 2024-01-31: 40 mg via INTRAVENOUS
  Filled 2024-01-31: qty 4

## 2024-01-31 MED ORDER — POTASSIUM CHLORIDE 20 MEQ PO PACK
40.0000 meq | PACK | Freq: Once | ORAL | Status: AC
  Administered 2024-01-31: 40 meq
  Filled 2024-01-31: qty 2

## 2024-01-31 NOTE — Consult Note (Signed)
 PHARMACY - ANTICOAGULATION CONSULT NOTE  Pharmacy Consult for Heparin  Indication: atrial fibrillation  Patient Measurements: Height: 5\' 4"  (162.6 cm) Weight: 92 kg (202 lb 13.2 oz) IBW/kg (Calculated) : 54.7 HEPARIN  DW (KG): 74.7  Labs: Recent Labs    01/28/24 0914 01/28/24 0915 01/28/24 2138 01/28/24 2351 01/29/24 0401 01/29/24 0800 01/29/24 1140 01/29/24 1610 01/29/24 2001 01/29/24 2250 01/30/24 0159 01/30/24 2341  HGB 13.1  --   --   --  12.3  --   --   --   --   --  11.3*  --   HCT 42.7  --   --   --  39.6  --   --   --   --   --  34.9*  --   PLT 495*  --   --   --  421*  --   --   --   --   --  244  --   APTT  --   --  78*  --   --   --   --   --   --   --   --   --   LABPROT 13.8  --   --   --   --   --   --   --   --   --   --   --   INR 1.0  --   --   --   --   --   --   --   --   --   --   --   HEPARINUNFRC  --   --   --    < > 0.64  --  0.61  --   --   --  0.77* 0.37  CREATININE 1.11*  --  1.59*  --  1.47*  --   --   --   --   --  1.43*  --   TROPONINIHS  --    < > 223*   < > 539*   < > 996*   < > 855* 685* 661*  --    < > = values in this interval not displayed.   Estimated Creatinine Clearance: 33.9 mL/min (A) (by C-G formula based on SCr of 1.43 mg/dL (H)).  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 female admitted with Acute Hypoxic Respiratory Failure and Severe Sepsis with Septic Shock due to Community Acquired Pneumonia, along with concern for Acute decompensated CHF and Cardiogenic shock, along with A. Fib w/ RVR now requiring intubation. Pharmacy has been consulted to initiate and dose continuous heparin   infusion.  Goal of Therapy:  Heparin  level 0.3-0.7 units/ml Monitor platelets by anticoagulation protocol: Yes   Plan:  5/9:  HL @ 2341 = 0.37, therapeutic X 1 - Will continue pt on current rate and recheck HL in 8 hrs - CBC daily   Shirley Bolle D 01/31/2024,12:46 AM

## 2024-01-31 NOTE — Progress Notes (Addendum)
                                                     Palliative Care Progress Note, Assessment & Plan   Patient Name: Tanya Frye       Date: 01/31/2024 DOB: 17-Jul-1941  Age: 83 y.o. MRN#: [id number] Attending Physician: [name], MD Primary Care Physician: Pcp, No Admit Date: 01/28/2024  Subjective: Patient intubated and sedated with 3 pressors.  No family at bedside.  HPI: 83 y.o. female  with past medical history of COPD, CHF, HTN, type 2 diabetes, and HOH admitted on 01/28/2024 with acute respiratory distress requiring BiPAP.   Upon arrival to ED, patient found to be in A-fib with RVR and was given amiodarone .  Additionally, patient developed septic and cardiogenic shock requiring pressor support.  PCCM was consulted and later that evening patient required intubation.   Patient remains critically ill with multiorgan failure supported by 3 vasopressors and worsening leukocytosis.  LVEF on echo revealed 25-30% with global hypokinesis.   PMT was consulted to support patient and family with goals of care discussions.   Summary of counseling/coordination of care: Extensive chart review completed prior to meeting patient including labs, vital signs, imaging, progress notes, orders, and available advanced directive documents from current and previous encounters.   After reviewing the patient's chart and assessing the patient at bedside, I spoke with [name], daughter-in-law, in regards to symptom management and goals of care.   Contacted [name] by telephone.  She Frye she visited earlier today and was present when patient failed SBT.  Tanya Frye her and her grandson will visit again tomorrow for the next SBT trial in hopes that grandsons presence will provide relief from anxiety.  Tanya Frye advises she has no questions and wants to continue full  code/full scope care with hope Tanya Frye will get better.  Advised PMT would try to visit while she is at bedside tomorrow.  Therapeutic silence and active listening provided for Tanya Frye to share her thoughts and emotions regarding current medical situation.  Emotional support provided.  Physical Exam Constitutional:      General: She is not in acute distress.    Appearance: She is obese. She is ill-appearing.  HENT:     Head: Normocephalic and atraumatic.     Mouth/Throat:     Comments: OG in place Pulmonary:     Comments: Mechanically ventilated/sedated Skin:    General: Skin is warm and dry.     Coloration: Skin is pale. Skin is not jaundiced.    Recommendations/Plan: FULL CODE/FULL SCOPE status as previously documented    Continue current supportive interventions        Total Time 25 minutes   Time spent includes: Detailed review of medical records (labs, imaging, vital signs), medically appropriate exam (mental status, respiratory, cardiac, skin), discussed with treatment team, counseling and educating patient, family and staff, documenting clinical information, medication management and coordination of care.     Tanya Frye, Tanya Frye- [facility] Palliative Medicine Team  01/31/2024 1:56 PM  Office [phone]  Pager [phone]

## 2024-01-31 NOTE — Progress Notes (Signed)
 PHARMACY CONSULT NOTE - ELECTROLYTES  Pharmacy Consult for Electrolyte Monitoring and Replacement   Recent Labs: Height: 5\' 4"  (162.6 cm) Weight: 93 kg (205 lb 0.4 oz) IBW/kg (Calculated) : 54.7 Estimated Creatinine Clearance: 40.6 mL/min (A) (by C-G formula based on SCr of 1.2 mg/dL (H)). Potassium (mmol/L)  Date Value  01/31/2024 3.3 (L)   Magnesium  (mg/dL)  Date Value  16/06/9603 2.1   Calcium (mg/dL)  Date Value  54/05/8118 7.5 (L)   Albumin (g/dL)  Date Value  14/78/2956 2.8 (L)   Phosphorus (mg/dL)  Date Value  21/30/8657 3.7   Sodium (mmol/L)  Date Value  01/31/2024 133 (L)   Assessment  Tanya Frye is a 83 y.o. female presenting with septic shock 2/2 CAP. PMH significant for COPD, CHF,  hypertension, T2DM. Pharmacy has been consulted to monitor and replace electrolytes.  Diet: NPO Pertinent medications: Amiodarone , epinephrine , norepinephrine , vasopressin , dobutamine , digoxin   Goal of Therapy: Electrolytes WNL  Plan:  K 3.3, Kcl 40 mEq per tube x 1 dose Re-check labs tomorrow AM  Thank you for allowing pharmacy to be a part of this patient's care.  Giorgia Wahler A Zamar Odwyer 01/31/2024 8:17 AM

## 2024-01-31 NOTE — Plan of Care (Signed)
 Patient remains intubated, unable to educate. Sedation turned off this morning. Patients heart rate increased and had some labored breathing. Sedation restarted. Daughter in law at patients bedside. Tube feeds continued. Foley intact with marginal urine output (cloudy with sediment) UA ordered. EKG performed, Seroquel ordered. Potassium replaced and rechecked 3.7. Continue to monitor.

## 2024-01-31 NOTE — Consult Note (Signed)
 PHARMACY - ANTICOAGULATION CONSULT NOTE  Pharmacy Consult for Heparin  Indication: atrial fibrillation  Patient Measurements: Height: 5\' 4"  (162.6 cm) Weight: 93 kg (205 lb 0.4 oz) IBW/kg (Calculated) : 54.7 HEPARIN  DW (KG): 74.7  Labs: Recent Labs    01/28/24 0914 01/28/24 0915 01/28/24 2138 01/28/24 2351 01/29/24 0401 01/29/24 0800 01/29/24 2001 01/29/24 2250 01/30/24 0159 01/30/24 2341 01/31/24 0353 01/31/24 0743  HGB 13.1  --   --   --  12.3  --   --   --  11.3*  --  10.4*  --   HCT 42.7  --   --   --  39.6  --   --   --  34.9*  --  32.5*  --   PLT 495*  --   --   --  421*  --   --   --  244  --  197  --   APTT  --   --  78*  --   --   --   --   --   --   --   --   --   LABPROT 13.8  --   --   --   --   --   --   --   --   --   --   --   INR 1.0  --   --   --   --   --   --   --   --   --   --   --   HEPARINUNFRC  --   --   --   --  0.64   < >  --   --  0.77* 0.37  --  0.40  CREATININE 1.11*  --  1.59*  --  1.47*  --   --   --  1.43*  --  1.20*  --   TROPONINIHS  --    < > 223*   < > 539*   < > 855* 685* 661*  --   --   --    < > = values in this interval not displayed.   Estimated Creatinine Clearance: 40.6 mL/min (A) (by C-G formula based on SCr of 1.2 mg/dL (H)).  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 female admitted with Acute Hypoxic Respiratory Failure and Severe Sepsis with Septic Shock due to Community Acquired Pneumonia, along with concern for Acute decompensated CHF and Cardiogenic shock, along with A. Fib w/ RVR now requiring intubation. Pharmacy has been consulted to initiate and dose continuous heparin   infusion.  Goal of Therapy:  Heparin  level 0.3-0.7 units/ml Monitor platelets by anticoagulation protocol: Yes   Plan:  5/10:  HL @ 0743 = 0.40, therapeutic X 2 - Will continue pt on current rate and recheck HL tomorrow with morning labs - CBC daily   Cristal Don, PharmD Clinical Pharmacist 01/31/2024 8:19 AM

## 2024-01-31 NOTE — Progress Notes (Signed)
 NAME:  Tanya Frye, MRN:  [id number], DOB:  November 23, 1940, LOS: 3 ADMISSION DATE:  01/28/2024, CONSULTATION DATE:  01/28/2024 REFERRING MD:  Dr. Demetrios Finders, CHIEF COMPLAINT:  Shortness of Breath/Acute Respiratory Distress   Brief Pt Description / Synopsis:  83 y.o. female admitted with Acute Hypoxic Respiratory Failure and Severe Sepsis with Septic Shock due to Community Acquired Pneumonia, along with Acute decompensated HFrEF and Cardiogenic shock, and A. Fib w/ RVR.  Failed trial of BiPAP requiring intubation and mechanical ventilation.  History of Present Illness:  Tanya Frye is a 83 year old female with a past medical history significant for COPD, CHF,  hypertension, diabetes mellitus, and hard of hearing who presents to [facility] ED on 01/28/2024 due to shortness of breath.  Patient is currently unable to contribute to history due to acute respiratory distress, BiPAP, and being extremely hard of hearing, therefore history is obtained from chart review.  Per report she acutely developed respiratory distress earlier this morning.  Upon EMS arrival she was noted to be hypoxic with O2 saturations of 90% on room air which they placed her on CPAP en route to the hospital.  Given her respiratory distress upon arrival, she was transitioned to BiPAP.  ED Course: Initial Vital Signs: Temperature 96.9 F axillary, pulse 128, respiratory rate 33, blood pressure 146/119, SpO2 99% on BiPAP Significant Labs: Sodium 134, glucose 256, BUN 11, creatinine 1.1, BNP 251, lactic acid 4.8, high-sensitivity troponin 8, WBC 22.8 with neutrophilia VBG: pH 7.24/pCO2 51/pO2 58/bicarb 21.9 COVID-19/flu/RSV PCR is negative Imaging Chest X-ray>>IMPRESSION: 1. Mild cardiomegaly and vascular congestion. 2. Small bilateral pleural effusions and bibasilar atelectasis or infiltrate. Medications Administered: 2.5 L of LR boluses, IV azithromycin  and ceftriaxone , 0.5 mg IV Ativan , 4 mg morphine , 5 mg IV Cardizem   Following fluid  resuscitation in the ED, systolic blood pressure decreasing to the 70s and 80s requiring initiation of peripheral Levophed .  PCCM asked to admit for further workup and treatment.  Please see "Significant Hospital Events" section below for full detailed hospital course.  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   CHF (congestive heart failure) (HCC)    Diabetes mellitus without complication (HCC)    HOH (hard of hearing)    extremely HOH   Hypertension    Palpitations    occasional related to meds/heart races    Micro Data:  5/7: COVID/flu/RSV PCR>>negative 5/7: RVP>>negative 5/7: Blood culture x 2>>no growth to date 5/7: Tracheal aspirate>>unable to collect 5/7: MRSA PCR>>negative 5/7: Strep pneumo>>negative  Antimicrobials:   Anti-infectives (From admission, onward)    Start     Dose/Rate Route Frequency Ordered Stop   01/29/24 1800  ceFEPIme  (MAXIPIME ) 2 g in sodium chloride  0.9 % 100 mL IVPB        2 g 200 mL/hr over 30 Minutes Intravenous Every 12 hours 01/29/24 1341     01/29/24 1000  azithromycin  (ZITHROMAX ) 500 mg in sodium chloride  0.9 % 250 mL IVPB        500 mg 250 mL/hr over 60 Minutes Intravenous Every 24 hours 01/28/24 1343 02/02/24 0959   01/29/24 1000  cefTRIAXone  (ROCEPHIN ) 2 g in sodium chloride  0.9 % 100 mL IVPB  Status:  Discontinued        2 g 200 mL/hr over 30 Minutes Intravenous Every 24 hours 01/28/24 1343 01/29/24 1341   01/28/24 1000  cefTRIAXone  (ROCEPHIN ) 2 g in sodium chloride  0.9 % 100  mL IVPB        2 g 200 mL/hr over 30 Minutes Intravenous Once 01/28/24 0954 01/28/24 1045   01/28/24 1000  azithromycin  (ZITHROMAX ) 500 mg in sodium chloride  0.9 % 250 mL IVPB        500 mg 250 mL/hr over 60 Minutes Intravenous  Once 01/28/24 0954 01/28/24 1135       Significant Hospital Events: Including procedures, antibiotic start and stop dates in addition to other pertinent events    5/7: Presents to ED with Acute Respiratory Distress requiring BiPAP.  With A.fib w/ RVR requiring Amiodarone  bolus and drip, developing septic/cardiogenic shock requiring initiation of peripheral Levophed   PCCM asked to admit.  Required intubation later in the evening. 5/8: Remains critically ill with multiorgan failure,  on vent.  Requiring 3 vasopressors (Levo, vaso, Epi).  Cardiology and Palliative Care consulted.  Metabolic and lactic acidosis improving, stop Bicarb gtt.  Leukocytosis worsened, broaden Ceftriaxone  to Cefepime , cultures still pending.  Echo shows LVEF 25-30% with global hypokinesis . 5/9: Overnight unable to tolerate turning due to desaturations.   Remains critically ill on the vent, remains on 3 vasopressors along with Dobutamine .  Plan to obtain internal jugular/Subclavian access to be able to follow Coox, remove femoral lines. Renal function stable, leukocytosis improving.  Diurese with 60 mg IV Lasix  x1 dose, pleural effusions improved with diuresis therefore holding off on thoracentesis. 5/10: Pt remains mechanically intubated FiO2 50%/PEEP 8.  Remains levophed  and vasopressin .  During WUA pt able to follow commands, but failed SBT due to elevated hr and bp along with tachypnea with accessory muscle use   Interim History / Subjective:  As outlined above under "Significant Hospital Events" section  Objective   Blood pressure 107/65, pulse 93, temperature 98.7 F (37.1 C), temperature source Axillary, resp. rate 20, height 5\' 4"  (1.626 m), weight 93 kg, SpO2 97%. CVP:  [7 mmHg-14 mmHg] 7 mmHg  Vent Mode: PRVC FiO2 (%):  [45 %-50 %] 50 % Set Rate:  [20 bmp] 20 bmp Vt Set:  [450 mL] 450 mL PEEP:  [5 cmH20-8 cmH20] 8 cmH20 Plateau Pressure:  [19 cmH20-27 cmH20] 27 cmH20   Intake/Output Summary (Last 24 hours) at 01/31/2024 0720 Last data filed at 01/31/2024 0530 Gross per 24 hour  Intake 2707.25 ml  Output 1400 ml  Net 1307.25 ml   Filed Weights   01/29/24 0445 01/30/24  0424 01/31/24 0417  Weight: 91.9 kg 92 kg 93 kg    Examination: General: Acutely-ill appearing female, NAD mechanically intubated  HENT: Atraumatic, normocephalic, neck supple, no JVD, orally intubated Lungs: Faint rhonchi throughout, even, non labored, synchronous with the vent Cardiovascular: Irregular irregular, no m/r/g, 1+ radial/1+ distal pulses, no edema  Abdomen: +BS x4, obese, soft, non tender  Extremities: Chronic trophic changes to bilateral lower extremities Neuro: Sedated, following commands, PERRL  GU: Foley catheter in place draining yellow urine   Resolved Hospital Problem list     Assessment & Plan:   #Acute Metabolic Encephalopathy #Sedation needs in setting of mechanical ventilation PMHx: HOH - Treat metabolic derangements  - Maintain a RASS goal of 0 to -1 - PAD protocol to maintain RASS goal: Fentanyl  and Precedex  as needed to maintain RASS goal - Avoid sedating medications as able - Daily wake up assessment  #Shock: septic +/- cardiogenic #New onset atrial fibrillation with rvr #Acute decompensated HFrEF  #Elevated troponin in setting of demand ischemia vs NSTEMI PMHx: HTN, palpitations Echocardiogram 01/29/24: LVEF 25-30%, LV with global hypokinesis  with severe hypokinesis of the basal to mid anterior/anteroseptal wall.  Diastolic parameters indeterminate. RV systolic function normal.  RV size normal.  Mild to moderate mitral regurgitation - Continuous telemetry monitoring - Prn levophed  and vasopressin  gtts to maintain map 65 or higher  - Trend lactic acid until normalized - Troponin peaked at 1,032 - Follow Coox panel~Dobutamine  increased to 7.5 mcg/kg/min - Diuresis as BP and renal function permits - Continue amiodarone  and heparin  gtts - TSH normal - Stress dose steroids weaned from 100 mg q12hrs to 50 mg q12hrs  - Cardiology following, appreciate input  #Acute hypoxic respiratory failure #Possible community acquired pneumonia #Acute COPD  exacerbation #Acute decompensated HFrEF #Bilateral pleural effusions  CTa Chest negative for PE, with medium to large bilateral pleural effusions with basilar infiltrates and atelectasis (edema vs superimposed pneumonia) - Full vent support, implement lung protective strategies - Plateau pressures less than 30 cm H20 - Wean FiO2 & PEEP as tolerated to maintain O2 sats 88 to 92% - Follow intermittent CXR & ABG as needed - Spontaneous Breathing Trials when respiratory parameters met and mental status permits - Implement VAP Bundle - Bronchodilators  #Acute kidney injury #Anion gap metabolic acidosis due to lactic acidosis - Trend BMP  - Replace electrolytes as indicated  - Strict I&O's  - Avoid nephrotoxic agents as able   #Met SIRS criteria (HR 130's, RR 30's, WBC 22.8) #Severe sepsis #Community acquired pneumonia CTa Abdomen & Pelvis with diffuse fatty liver changes - Trend WBC and monitor fever curve  - Follow cultures  - Continue abx as outlined above pending culture results and sensitivities   #Diabetes mellitus  Hgb A1c is 6.6 - CBG's q4h; Target range of 140 to 180 - SSI - Follow ICU hypo/hyperglycemia protocol  Pt is critically ill with multiorgan failure. Prognosis is guarded, high risk for further decompensation, cardiac arrest, and death.  Given current critical illness superimposed on multiple chronic co-morbidities and advanced age, overall long term prognosis is poor.  Recommend consideration of DNR/DNI status.  Palliative Care following to assist with GOC discussions.  Best Practice (right click and "Reselect all SmartList Selections" daily)   Diet/type: NPO, tube feeds DVT prophylaxis: Heparin  gtt GI prophylaxis: PPI Lines: Right subclavian CVC and right radial arterial line still needed  Foley:  yes, and is still needed Code Status:  full code Last date of multidisciplinary goals of care discussion [01/31/24]  5/10: Updated pts daughter-in-law regarding pts  condition and current plan of care.  All questions answered  Labs   CBC: Recent Labs  Lab 01/28/24 0914 01/29/24 0401 01/30/24 0159 01/31/24 0353  WBC 22.8* 28.1* 23.4* 18.7*  NEUTROABS 16.8*  --   --   --   HGB 13.1 12.3 11.3* 10.4*  HCT 42.7 39.6 34.9* 32.5*  MCV 87.5 85.7 82.9 85.8  PLT 495* 421* 244 197    Basic Metabolic Panel: Recent Labs  Lab 01/28/24 0914 01/28/24 1747 01/28/24 2138 01/29/24 0401 01/30/24 0159 01/31/24 0353  NA 134*  --  134* 136 134* 133*  K 3.8  --  4.2 4.1 3.5 3.3*  CL 98  --  97* 96* 95* 95*  CO2 22  --  12* 17* 24 27  GLUCOSE 256*  --  376* 342* 283* 228*  BUN 11  --  16 17 24* 26*  CREATININE 1.11*  --  1.59* 1.47* 1.43* 1.20*  CALCIUM 8.8*  --  7.9* 7.8* 7.4* 7.5*  MG  --  1.6*  --  2.0 1.9 2.1  PHOS  --  8.4*  --  5.6* 4.6 3.7   GFR: Estimated Creatinine Clearance: 40.6 mL/min (A) (by C-G formula based on SCr of 1.2 mg/dL (H)). Recent Labs  Lab 01/28/24 0914 01/28/24 1422 01/29/24 0401 01/29/24 0745 01/29/24 1140 01/29/24 2001 01/30/24 0159 01/31/24 0353  PROCALCITON <0.10  --   --   --   --   --   --   --   WBC 22.8*  --  28.1*  --   --   --  23.4* 18.7*  LATICACIDVEN 4.8*   < > 7.6* 5.5* 3.8* 2.1*  --   --    < > = values in this interval not displayed.    Liver Function Tests: Recent Labs  Lab 01/28/24 0914 01/29/24 0401 01/30/24 0159 01/31/24 0353  AST 28 51*  --   --   ALT 20 38  --   --   ALKPHOS 29* 24*  --   --   BILITOT 0.6 0.9  --   --   PROT 7.6 6.1*  --   --   ALBUMIN 4.0 3.1*  3.1* 2.9* 2.8*   No results for input(s): "LIPASE", "AMYLASE" in the last 168 hours. No results for input(s): "AMMONIA" in the last 168 hours.  ABG    Component Value Date/Time   PHART 7.38 01/30/2024 0345   PCO2ART 35 01/30/2024 0345   PO2ART 70 (L) 01/30/2024 0345   HCO3 20.7 01/30/2024 0345   ACIDBASEDEF 3.8 (H) 01/30/2024 0345   O2SAT 74.6 01/30/2024 1553     Coagulation Profile: Recent Labs  Lab  01/28/24 0914  INR 1.0    Cardiac Enzymes: No results for input(s): "CKTOTAL", "CKMB", "CKMBINDEX", "TROPONINI" in the last 168 hours.  HbA1C: HbA1c POC (<> result, manual entry)  Date/Time Value Ref Range Status  04/03/2021 03:07 PM 7.1 4.0 - 5.6 % Final   Hgb A1c MFr Bld  Date/Time Value Ref Range Status  01/30/2024 01:59 AM 6.7 (H) 4.8 - 5.6 % Final    Comment:    (NOTE) Pre diabetes:          5.7%-6.4%  Diabetes:              >6.4%  Glycemic control for   <7.0% adults with diabetes   01/29/2024 04:01 AM 6.6 (H) 4.8 - 5.6 % Final    Comment:    (NOTE) Pre diabetes:          5.7%-6.4%  Diabetes:              >6.4%  Glycemic control for   <7.0% adults with diabetes     CBG: Recent Labs  Lab 01/30/24 1210 01/30/24 1631 01/30/24 1943 01/30/24 2342 01/31/24 0405  GLUCAP 209* 208* 155* 214* 237*    Review of Systems:   Unable to assess due to intubation/sedation/critical illness   Past Medical History:  She,  has a past medical history of Cancer (HCC), CHF (congestive heart failure) (HCC), Diabetes mellitus without complication (HCC), HOH (hard of hearing), Hypertension, and Palpitations.   Surgical History:   Past Surgical History:  Procedure Laterality Date   ABDOMINAL HYSTERECTOMY  1990   CATARACT EXTRACTION W/PHACO Right 12/20/2020   Procedure: CATARACT EXTRACTION PHACO AND INTRAOCULAR LENS PLACEMENT (IOC) RIGHT DIABETIC 8.13 01:17.6 10.5%;  Surgeon: Annell Kidney, MD;  Location: [facility];  Service: Ophthalmology;  Laterality: Right;   CATARACT EXTRACTION W/PHACO Left 01/03/2021   Procedure: CATARACT EXTRACTION PHACO AND  INTRAOCULAR LENS PLACEMENT (IOC) LEFT DIABETIC  7.37 01:15.6 9.8%;  Surgeon: Annell Kidney, MD;  Location: [facility];  Service: Ophthalmology;  Laterality: Left;   CHOLECYSTECTOMY     DILATION AND CURETTAGE OF UTERUS  before 1990   X2   NECK SURGERY  03/30/2008   Per family, pt broke her neck    STOMACH SURGERY     tumor removed, per family     Social History:   reports that she has never smoked. She has never used smokeless tobacco. She reports that she does not drink alcohol and does not use drugs.   Family History:  Her family history is not on file.   Allergies Allergies  Allergen Reactions   Penicillins Rash     Home Medications  Prior to Admission medications   Medication Sig Start Date End Date Taking? Authorizing Provider  aspirin  EC 81 MG tablet Take 1 tablet by mouth daily.    [provider]  Calcium Carbonate-Vit D-Min (CALCIUM 1200 PO) Take by mouth.    [provider]  furosemide  (LASIX ) 40 MG tablet TAKE 1 TABLET BY MOUTH EVERY DAY 08/27/21   Theron Flavin, MD  glipiZIDE  (GLUCOTROL  XL) 5 MG 24 hr tablet Take 3 tablets (15 mg total) by mouth daily. 11/13/21   Masoud, Javed, MD  levothyroxine (SYNTHROID, LEVOTHROID) 88 MCG tablet Take 88 mcg by mouth every morning. 12/09/17   [provider]  loratadine  (CLARITIN ) 10 MG tablet TAKE 1 TABLET BY MOUTH EVERY DAY 09/24/22   Theron Flavin, MD  losartan  (COZAAR ) 50 MG tablet TAKE 1 TABLET BY MOUTH EVERY DAY 06/10/22   Masoud, Javed, MD  lovastatin (MEVACOR) 10 MG tablet TAKE 1 TABLET ONCE A DAY ORALLY 10/20/17   [provider]  metFORMIN  (GLUCOPHAGE ) 1000 MG tablet TAKE 1 TABLET BY MOUTH TWICE A DAY 12/20/20   Masoud, Javed, MD  metoprolol  succinate (TOPROL -XL) 50 MG 24 hr tablet TAKE 1 TABLET BY MOUTH DAILY. TAKE WITH OR IMMEDIATELY FOLLOWING A MEAL. 08/23/22   Theron Flavin, MD  Omega-3 Fatty Acids (FISH OIL) 500 MG CAPS Take 1,000 mg by mouth daily.    [provider]  omeprazole  (PRILOSEC) 20 MG capsule TAKE 1 CAPSULE BY MOUTH EVERY DAY 06/04/21   Theron Flavin, MD     Critical care time: 40 minutes    Janey Meek, AGNP  Pulmonary/Critical Care Pager [phone] (please enter 7 digits) PCCM Consult Pager [phone] (please enter 7 digits)

## 2024-01-31 NOTE — Plan of Care (Signed)
  Problem: Clinical Measurements: Goal: Ability to maintain clinical measurements within normal limits will improve Outcome: Progressing Goal: Will remain free from infection Outcome: Progressing Goal: Respiratory complications will improve Outcome: Progressing   Problem: Elimination: Goal: Will not experience complications related to bowel motility Outcome: Progressing Goal: Will not experience complications related to urinary retention Outcome: Progressing   Problem: Pain Managment: Goal: General experience of comfort will improve and/or be controlled Outcome: Progressing

## 2024-01-31 NOTE — Progress Notes (Signed)
 Rounding Note    Patient Name: Tanya Frye Date of Encounter: 01/31/2024  [facility] Cardiologist: Belva Boyden, MD   Subjective   Remains sedated on fentanyl , Versed  Hypotensive with blood pressure support on dobutamine , vasopressin , nor epi -rate controlled atrial fibrillation on amiodarone  infusion, also on heparin  infusion - Remains intubated, sedated - No family at the bedside Will partially open eyes on stimuli  Inpatient Medications    Scheduled Meds:  aspirin   81 mg Per Tube Daily   Chlorhexidine  Gluconate Cloth  6 each Topical Daily   docusate  100 mg Per Tube BID   free water   30 mL Per Tube Q4H   hydrocortisone  sod succinate (SOLU-CORTEF ) inj  50 mg Intravenous Q12H   insulin  aspart  0-20 Units Subcutaneous Q4H   insulin  aspart  9 Units Subcutaneous Q4H   insulin  glargine-yfgn  10 Units Subcutaneous BID   levothyroxine  88 mcg Per Tube Q0600   multivitamin with minerals  1 tablet Per Tube Daily   mouth rinse  15 mL Mouth Rinse Q2H   pantoprazole  (PROTONIX ) IV  40 mg Intravenous Daily   polyethylene glycol  17 g Per Tube Daily   QUEtiapine  25 mg Per Tube BID   sodium chloride  flush  10-40 mL Intracatheter Q12H   Continuous Infusions:  amiodarone  30 mg/hr (01/31/24 1337)   ceFEPime  (MAXIPIME ) IV Stopped (01/31/24 0559)   dexmedetomidine  (PRECEDEX ) IV infusion Stopped (01/30/24 1513)   dextrose      DOBUTamine  7.5 mcg/kg/min (01/31/24 1337)   feeding supplement (VITAL HIGH PROTEIN) 40 mL/hr at 01/31/24 1337   fentaNYL  infusion INTRAVENOUS 175 mcg/hr (01/31/24 1337)   heparin  1,000 Units/hr (01/31/24 1337)   midazolam  1.5 mg/hr (01/31/24 1337)   norepinephrine  (LEVOPHED ) Adult infusion 5 mcg/min (01/31/24 1337)   vasopressin  0.04 Units/min (01/31/24 1337)   PRN Meds: dextrose , dextrose , docusate sodium , fentaNYL , midazolam , mouth rinse, polyethylene glycol, sodium chloride  flush   Vital Signs    Vitals:   01/31/24 1200 01/31/24 1230  01/31/24 1300 01/31/24 1330  BP: 105/68 103/67 107/67 109/60  Pulse: 96 95 90 84  Resp: 20 20 20 20   Temp:      TempSrc:      SpO2: 98% 97% 97% 97%  Weight:      Height:        Intake/Output Summary (Last 24 hours) at 01/31/2024 1456 Last data filed at 01/31/2024 1400 Gross per 24 hour  Intake 3078.99 ml  Output 1625 ml  Net 1453.99 ml      01/31/2024    4:17 AM 01/30/2024    4:24 AM 01/29/2024    4:45 AM  Last 3 Weights  Weight (lbs) 205 lb 0.4 oz 202 lb 13.2 oz 202 lb 9.6 oz  Weight (kg) 93 kg 92 kg 91.9 kg      Telemetry    Rate controlled atrial fibrillation rate 80s- Personally Reviewed  ECG     - Personally Reviewed  Physical Exam   GEN:  intubated, sedated Neck: No JVD Cardiac: Irregularly irregular, no murmurs, rubs, or gallops.  Respiratory: Coarse breath sounds bilaterally GI: Soft, mildly-distended  MS: No edema; No deformity. Neuro: Full exam not performed Psych: Sedated  Labs    High Sensitivity Troponin:   Recent Labs  Lab 01/29/24 1140 01/29/24 1610 01/29/24 2001 01/29/24 2250 01/30/24 0159  TROPONINIHS 996* 1,032* 855* 685* 661*     Chemistry Recent Labs  Lab 01/28/24 1610 01/28/24 1747 01/29/24 0401 01/30/24 0159 01/31/24 9604  01/31/24 1222  NA 134*   < > 136 134* 133*  --   K 3.8   < > 4.1 3.5 3.3* 3.7  CL 98   < > 96* 95* 95*  --   CO2 22   < > 17* 24 27  --   GLUCOSE 256*   < > 342* 283* 228*  --   BUN 11   < > 17 24* 26*  --   CREATININE 1.11*   < > 1.47* 1.43* 1.20*  --   CALCIUM 8.8*   < > 7.8* 7.4* 7.5*  --   MG  --    < > 2.0 1.9 2.1  --   PROT 7.6  --  6.1*  --   --   --   ALBUMIN 4.0  --  3.1*  3.1* 2.9* 2.8*  --   AST 28  --  51*  --   --   --   ALT 20  --  38  --   --   --   ALKPHOS 29*  --  24*  --   --   --   BILITOT 0.6  --  0.9  --   --   --   GFRNONAA 50*   < > 36* 37* 45*  --   ANIONGAP 14   < > 23* 15 11  --    < > = values in this interval not displayed.    Lipids No results for input(s): "CHOL",  "TRIG", "HDL", "LABVLDL", "LDLCALC", "CHOLHDL" in the last 168 hours.  Hematology Recent Labs  Lab 01/29/24 0401 01/30/24 0159 01/31/24 0353  WBC 28.1* 23.4* 18.7*  RBC 4.62 4.21 3.79*  HGB 12.3 11.3* 10.4*  HCT 39.6 34.9* 32.5*  MCV 85.7 82.9 85.8  MCH 26.6 26.8 27.4  MCHC 31.1 32.4 32.0  RDW 15.9* 16.0* 16.1*  PLT 421* 244 197   Thyroid  Recent Labs  Lab 01/30/24 0159  TSH 0.443    BNP Recent Labs  Lab 01/28/24 0914  BNP 251.9*    DDimer No results for input(s): "DDIMER" in the last 168 hours.   Radiology    DG Chest Port 1 View Result Date: 01/30/2024 CLINICAL DATA:  Central line placement. EXAM: PORTABLE CHEST 1 VIEW COMPARISON:  Earlier today. FINDINGS: Right subclavian central line tip is in the right atrium. No pneumothorax. Endotracheal and enteric tubes are unchanged in position. Increasing hazy opacity at the lung bases likely pleural effusions and atelectasis. Vascular congestion. Stable heart size and mediastinal contours. IMPRESSION: 1. Right subclavian central line tip in the right atrium. No pneumothorax. 2. Increasing hazy opacity at the lung bases likely pleural effusions and atelectasis. Electronically Signed   By: Chadwick Colonel M.D.   On: 01/30/2024 15:23   DG Chest Port 1 View Result Date: 01/30/2024 CLINICAL DATA:  Acute respiratory failure with hypoxia [facility] [address]) [id number] EXAM: PORTABLE CHEST 1 VIEW COMPARISON:  01/28/2024 FINDINGS: Endotracheal tube is 1.9 cm above the carina. Nasogastric tube extends into the abdomen but the tip is beyond the image. Cardiac pad was removed from the left chest. Increased densities in the mid and lower lungs. Patient is rotated towards the left. Difficult to assess cardiac silhouette size. Negative for a pneumothorax. IMPRESSION: 1. Increased densities in the mid and lower lungs. Findings are suggestive for increased atelectasis and possibly bilateral pleural effusions. Electronically Signed   By: Elene Griffes M.D.   On: 01/30/2024  08:56    Cardiac Studies  Echo Left ventricular ejection fraction, by estimation, is 25 to 30%. The  left ventricle has severely decreased function. The left ventricle  demonstrates global hypokinesis with severe hypokinesis of teh basal to  mid anterior/anteroseptal wall (apical  region not well visualized). The left ventricular internal cavity size was  mildly dilated. Left ventricular diastolic parameters are indeterminate.   2. Right ventricular systolic function is normal. The right ventricular  size is normal. There is normal pulmonary artery systolic pressure. The  estimated right ventricular systolic pressure is 34.4 mmHg.   3. The mitral valve is normal in structure. Mild to moderate mitral valve  regurgitation. No evidence of mitral stenosis. Moderate mitral annular  calcification.   4. The aortic valve is normal in structure. There is moderate  calcification of the aortic valve. Aortic valve regurgitation is not  visualized. Aortic valve sclerosis/calcification is present, unable to  exclude stenosis (gradient not measured.   5. The inferior vena cava is normal in size with greater than 50%  respiratory variability, suggesting right atrial pressure of 3 mmHg.   Patient Profile     Tanya Frye is a 83 y.o. 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% with global hypokinesis and wall motion abnormalities -Hypotension in the setting of sepsis, pneumonia, low output cardiomyopathy -Remains on vasopressin  norepinephrine  dobutamine , persistent hypotension - On broad-spectrum antibiotics, steroids, bronchodilators,  Use of Lasix  limited secondary to hypotension, CVP 8  Non-STEMI In the setting of tachycardia, hypotension, cardiomyopathy  ejection fraction 25 to 30% Likely underlying coronary disease -Poor candidate at this time for cardiac catheterization given age, frailty, other comorbidities - Hypotension limiting use of GDMT - Continue aspirin , heparin  infusion -After improvement of sepsis picture, could consider ischemic workup if family desires   Acute diastolic and systolic CHF Severely reduced ejection fraction 25 to 30% with concern for wall motion abnormality and underlying ischemia -CVP of 8, no strong indication for Lasix    Septic shock/community-acquired pneumonia This, pneumonia, CHF, bilateral pleural effusions Intubated  May 7 at 7 PM for worsening respiratory failure  -Critically ill, Outlook poor given frailty, multiorgan system involvement  For questions or updates, please contact  [facility] Please consult [email or web address] for contact info under        Signed, Mychael Brecht, MD  01/31/2024, 2:56 PM

## 2024-02-01 ENCOUNTER — Inpatient Hospital Stay

## 2024-02-01 DIAGNOSIS — J9600 Acute respiratory failure, unspecified whether with hypoxia or hypercapnia: Secondary | ICD-10-CM | POA: Diagnosis not present

## 2024-02-01 DIAGNOSIS — N179 Acute kidney failure, unspecified: Secondary | ICD-10-CM | POA: Diagnosis not present

## 2024-02-01 DIAGNOSIS — J90 Pleural effusion, not elsewhere classified: Secondary | ICD-10-CM | POA: Diagnosis not present

## 2024-02-01 DIAGNOSIS — J9601 Acute respiratory failure with hypoxia: Secondary | ICD-10-CM | POA: Diagnosis not present

## 2024-02-01 DIAGNOSIS — I4891 Unspecified atrial fibrillation: Secondary | ICD-10-CM | POA: Diagnosis present

## 2024-02-01 DIAGNOSIS — Z7189 Other specified counseling: Secondary | ICD-10-CM | POA: Diagnosis not present

## 2024-02-01 DIAGNOSIS — R579 Shock, unspecified: Secondary | ICD-10-CM | POA: Diagnosis not present

## 2024-02-01 DIAGNOSIS — Z515 Encounter for palliative care: Secondary | ICD-10-CM | POA: Diagnosis not present

## 2024-02-01 DIAGNOSIS — Z789 Other specified health status: Secondary | ICD-10-CM | POA: Diagnosis not present

## 2024-02-01 LAB — COOXEMETRY PANEL
Carboxyhemoglobin: 1.9 % — ABNORMAL HIGH (ref 0.5–1.5)
Carboxyhemoglobin: 1.4 % (ref 0.5–1.5)
Methemoglobin: 0.9 % (ref 0.0–1.5)
Methemoglobin: 1.4 % (ref 0.0–1.5)
O2 Saturation: 59.1 %
O2 Saturation: 67.2 %
Total hemoglobin: 10.1 g/dL — ABNORMAL LOW (ref 12.0–16.0)
Total hemoglobin: 9.7 g/dL — ABNORMAL LOW (ref 12.0–16.0)
Total oxygen content: 57.4 %
Total oxygen content: 65.3 %

## 2024-02-01 LAB — RENAL FUNCTION PANEL
Albumin: 2.7 g/dL — ABNORMAL LOW (ref 3.5–5.0)
Albumin: 2.7 g/dL — ABNORMAL LOW (ref 3.5–5.0)
Anion gap: 10 (ref 5–15)
Anion gap: 11 (ref 5–15)
BUN: 36 mg/dL — ABNORMAL HIGH (ref 8–23)
BUN: 40 mg/dL — ABNORMAL HIGH (ref 8–23)
CO2: 26 mmol/L (ref 22–32)
CO2: 29 mmol/L (ref 22–32)
Calcium: 7.5 mg/dL — ABNORMAL LOW (ref 8.9–10.3)
Calcium: 7.7 mg/dL — ABNORMAL LOW (ref 8.9–10.3)
Chloride: 97 mmol/L — ABNORMAL LOW (ref 98–111)
Chloride: 95 mmol/L — ABNORMAL LOW (ref 98–111)
Creatinine, Ser: 0.96 mg/dL (ref 0.44–1.00)
Creatinine, Ser: 1.1 mg/dL — ABNORMAL HIGH (ref 0.44–1.00)
GFR, Estimated: 59 mL/min — ABNORMAL LOW (ref >60)
GFR, Estimated: 50 mL/min — ABNORMAL LOW (ref >60)
Glucose, Bld: 251 mg/dL — ABNORMAL HIGH (ref 70–99)
Glucose, Bld: 152 mg/dL — ABNORMAL HIGH (ref 70–99)
Phosphorus: 3.3 mg/dL (ref 2.5–4.6)
Phosphorus: 3.6 mg/dL (ref 2.5–4.6)
Potassium: 3.8 mmol/L (ref 3.5–5.1)
Potassium: 3.7 mmol/L (ref 3.5–5.1)
Sodium: 133 mmol/L — ABNORMAL LOW (ref 135–145)
Sodium: 135 mmol/L (ref 135–145)

## 2024-02-01 LAB — CBC
HCT: 30.5 % — ABNORMAL LOW (ref 36.0–46.0)
Hemoglobin: 9.6 g/dL — ABNORMAL LOW (ref 12.0–15.0)
MCH: 27 pg (ref 26.0–34.0)
MCHC: 31.5 g/dL (ref 30.0–36.0)
MCV: 85.9 fL (ref 80.0–100.0)
Platelets: 174 10*3/uL (ref 150–400)
RBC: 3.55 MIL/uL — ABNORMAL LOW (ref 3.87–5.11)
RDW: 16.3 % — ABNORMAL HIGH (ref 11.5–15.5)
WBC: 11.7 10*3/uL — ABNORMAL HIGH (ref 4.0–10.5)
nRBC: 0 % (ref 0.0–0.2)

## 2024-02-01 LAB — GLUCOSE, CAPILLARY
Glucose-Capillary: 137 mg/dL — ABNORMAL HIGH (ref 70–99)
Glucose-Capillary: 140 mg/dL — ABNORMAL HIGH (ref 70–99)
Glucose-Capillary: 143 mg/dL — ABNORMAL HIGH (ref 70–99)
Glucose-Capillary: 150 mg/dL — ABNORMAL HIGH (ref 70–99)
Glucose-Capillary: 198 mg/dL — ABNORMAL HIGH (ref 70–99)
Glucose-Capillary: 203 mg/dL — ABNORMAL HIGH (ref 70–99)

## 2024-02-01 LAB — HEPARIN LEVEL (UNFRACTIONATED): Heparin Unfractionated: 0.39 [IU]/mL (ref 0.30–0.70)

## 2024-02-01 LAB — MAGNESIUM
Magnesium: 2.3 mg/dL (ref 1.7–2.4)
Magnesium: 2.3 mg/dL (ref 1.7–2.4)

## 2024-02-01 MED ORDER — FUROSEMIDE 10 MG/ML IJ SOLN
60.0000 mg | Freq: Once | INTRAMUSCULAR | Status: AC
  Administered 2024-02-01: 60 mg via INTRAVENOUS
  Filled 2024-02-01: qty 6

## 2024-02-01 MED ORDER — MIDAZOLAM BOLUS VIA INFUSION
1.0000 mg | INTRAVENOUS | Status: AC
  Administered 2024-02-01: 1 mg via INTRAVENOUS
  Filled 2024-02-01: qty 1

## 2024-02-01 MED ORDER — ENOXAPARIN SODIUM 100 MG/ML IJ SOSY
1.0000 mg/kg | PREFILLED_SYRINGE | Freq: Two times a day (BID) | INTRAMUSCULAR | Status: DC
  Administered 2024-02-01 – 2024-02-07 (×13): 92.5 mg via SUBCUTANEOUS
  Filled 2024-02-01 (×13): qty 1

## 2024-02-01 MED ORDER — SODIUM CHLORIDE 0.9 % IV SOLN
2.0000 g | Freq: Once | INTRAVENOUS | Status: AC
  Administered 2024-02-01: 2 g via INTRAVENOUS
  Filled 2024-02-01: qty 20

## 2024-02-01 NOTE — Inpatient Diabetes Management (Signed)
 Inpatient Diabetes Program Recommendations  AACE/ADA: New Consensus Statement on Inpatient Glycemic Control (2015)  Target Ranges:  Prepandial:   less than 140 mg/dL      Peak postprandial:   less than 180 mg/dL (1-2 hours)      Critically ill patients:  140 - 180 mg/dL   Lab Results  Component Value Date   GLUCAP 198 (H) 02/01/2024   HGBA1C 6.7 (H) 01/30/2024    Review of Glycemic Control  Diabetes history: DM2 Outpatient Diabetes medications: glipizide  15 mg daily, metformin  1000 mg BID Current orders for Inpatient glycemic control: Semglee  10 BID, Novolog  0-20 Q4H + 9 units Q4  Inpatient Diabetes Program Recommendations:    Consider:  Increasing Semglee  to 12 units BID  Increasing Novolog  to 10 units Q4H  Will continue to follow glucose trends.   Thank you. Joni Net, RD, LDN, CDCES Inpatient Diabetes Coordinator [phone]

## 2024-02-01 NOTE — Progress Notes (Addendum)
 Rounding Note    Patient Name: Tanya Frye Date of Encounter: 02/01/2024  [facility] Cardiologist: Belva Boyden, MD   Subjective   Family at the bedside Sedation weaned Tachycardic with rates up to 140 and higher, atrial fibrillation Hypotensive with systolics 60s to 80s this morning Failed breathing trial yesterday and today  Inpatient Medications    Scheduled Meds:  aspirin   81 mg Per Tube Daily   Chlorhexidine  Gluconate Cloth  6 each Topical Daily   enoxaparin (LOVENOX) injection  1 mg/kg Subcutaneous BID   free water   30 mL Per Tube Q4H   insulin  aspart  0-20 Units Subcutaneous Q4H   insulin  aspart  9 Units Subcutaneous Q4H   insulin  glargine-yfgn  10 Units Subcutaneous BID   levothyroxine  88 mcg Per Tube Q0600   multivitamin with minerals  1 tablet Per Tube Daily   mouth rinse  15 mL Mouth Rinse Q2H   pantoprazole  (PROTONIX ) IV  40 mg Intravenous Daily   QUEtiapine  25 mg Per Tube BID   sodium chloride  flush  10-40 mL Intracatheter Q12H   Continuous Infusions:  amiodarone  30 mg/hr (02/01/24 0540)   ceFEPime  (MAXIPIME ) IV 200 mL/hr at 02/01/24 0540   dexmedetomidine  (PRECEDEX ) IV infusion Stopped (01/30/24 1513)   dextrose      DOBUTamine  12.5 mcg/kg/min (02/01/24 1029)   feeding supplement (VITAL HIGH PROTEIN) 50 mL/hr at 02/01/24 0540   fentaNYL  infusion INTRAVENOUS 175 mcg/hr (02/01/24 1100)   midazolam  0.5 mg/hr (02/01/24 1100)   norepinephrine  (LEVOPHED ) Adult infusion Stopped (02/01/24 0920)   vasopressin  0.04 Units/min (02/01/24 0540)   PRN Meds: dextrose , dextrose , docusate sodium , fentaNYL , midazolam , mouth rinse, polyethylene glycol, sodium chloride  flush   Vital Signs    Vitals:   02/01/24 0900 02/01/24 1000 02/01/24 1046 02/01/24 1100  BP: 102/69 108/63  (!) 88/47  Pulse: (!) 104 (!) 102 (!) 155 (!) 135  Resp: 20 20 10 19   Temp:      TempSrc:      SpO2: 97% 97% 96% 97%  Weight:      Height:        Intake/Output Summary  (Last 24 hours) at 02/01/2024 1341 Last data filed at 02/01/2024 0540 Gross per 24 hour  Intake 2096.75 ml  Output 685 ml  Net 1411.75 ml      02/01/2024    4:39 AM 01/31/2024    4:17 AM 01/30/2024    4:24 AM  Last 3 Weights  Weight (lbs) 205 lb 4 oz 205 lb 0.4 oz 202 lb 13.2 oz  Weight (kg) 93.1 kg 93 kg 92 kg      Telemetry    Atrial fibrillation with RVR personally Reviewed  ECG     - Personally Reviewed  Physical Exam   Constitutional: Intubated, sedated HENT:  Head: Grossly normal Eyes:  no discharge. No scleral icterus.  Neck: No JVD, no carotid bruits  Cardiovascular: Rapid, irregularly irregular, , no murmurs appreciated Pulmonary/Chest: Coarse breath sounds bilaterally Abdominal: Soft.  no distension.    Musculoskeletal: Normal range of motion Neurological:  normal muscle tone. Coordination normal. No atrophy Skin: Skin warm and dry, pale Psychiatric: Sedated   Labs    High Sensitivity Troponin:   Recent Labs  Lab 01/29/24 1140 01/29/24 1610 01/29/24 2001 01/29/24 2250 01/30/24 0159  TROPONINIHS 996* 1,032* 855* 685* 661*     Chemistry Recent Labs  Lab 01/28/24 0914 01/28/24 1747 01/29/24 0401 01/30/24 0159 01/31/24 0353 01/31/24 1222 02/01/24 0400  NA  134*   < > 136 134* 133*  --  133*  K 3.8   < > 4.1 3.5 3.3* 3.7 3.8  CL 98   < > 96* 95* 95*  --  97*  CO2 22   < > 17* 24 27  --  26  GLUCOSE 256*   < > 342* 283* 228*  --  251*  BUN 11   < > 17 24* 26*  --  36*  CREATININE 1.11*   < > 1.47* 1.43* 1.20*  --  0.96  CALCIUM 8.8*   < > 7.8* 7.4* 7.5*  --  7.5*  MG  --    < > 2.0 1.9 2.1  --  2.3  PROT 7.6  --  6.1*  --   --   --   --   ALBUMIN 4.0  --  3.1*  3.1* 2.9* 2.8*  --  2.7*  AST 28  --  51*  --   --   --   --   ALT 20  --  38  --   --   --   --   ALKPHOS 29*  --  24*  --   --   --   --   BILITOT 0.6  --  0.9  --   --   --   --   GFRNONAA 50*   < > 36* 37* 45*  --  59*  ANIONGAP 14   < > 23* 15 11  --  10   < > = values in this  interval not displayed.    Lipids No results for input(s): "CHOL", "TRIG", "HDL", "LABVLDL", "LDLCALC", "CHOLHDL" in the last 168 hours.  Hematology Recent Labs  Lab 01/30/24 0159 01/31/24 0353 02/01/24 0400  WBC 23.4* 18.7* 11.7*  RBC 4.21 3.79* 3.55*  HGB 11.3* 10.4* 9.6*  HCT 34.9* 32.5* 30.5*  MCV 82.9 85.8 85.9  MCH 26.8 27.4 27.0  MCHC 32.4 32.0 31.5  RDW 16.0* 16.1* 16.3*  PLT 244 197 174   Thyroid  Recent Labs  Lab 01/30/24 0159  TSH 0.443    BNP Recent Labs  Lab 01/28/24 0914  BNP 251.9*    DDimer No results for input(s): "DDIMER" in the last 168 hours.   Radiology    DG Chest Port 1 View Result Date: 02/01/2024 CLINICAL DATA:  142230 Pleural effusion 142230 EXAM: PORTABLE CHEST 1 VIEW COMPARISON:  Jan 30, 2024 FINDINGS: The cardiomediastinal silhouette is unchanged in contour.ETT tip terminates 12 mm above the carina. The enteric tube courses through the chest to the abdomen beyond the field-of-view. RIGHT subclavian CVC tip terminates over the RIGHT atrium. Bilateral pleural effusions. No pneumothorax. LEFT retrocardiac homogeneous opacity. Hazy bibasilar opacities. Perihilar vascular fullness with mild interstitial markings and peribronchial cuffing. IMPRESSION: 1.  Support apparatus as described above. 2. Bilateral pleural effusions with LEFT retrocardiac opacity, favored to reflect atelectasis. 3. Background of pulmonary edema. Electronically Signed   By: Clancy Crimes M.D.   On: 02/01/2024 11:20   DG Chest Port 1 View Result Date: 01/30/2024 CLINICAL DATA:  Central line placement. EXAM: PORTABLE CHEST 1 VIEW COMPARISON:  Earlier today. FINDINGS: Right subclavian central line tip is in the right atrium. No pneumothorax. Endotracheal and enteric tubes are unchanged in position. Increasing hazy opacity at the lung bases likely pleural effusions and atelectasis. Vascular congestion. Stable heart size and mediastinal contours. IMPRESSION: 1. Right subclavian  central line tip in the right atrium. No pneumothorax. 2.  Increasing hazy opacity at the lung bases likely pleural effusions and atelectasis. Electronically Signed   By: Chadwick Colonel M.D.   On: 01/30/2024 15:23    Cardiac Studies   Echo Left ventricular ejection fraction, by estimation, is 25 to 30%. The  left ventricle has severely decreased function. The left ventricle  demonstrates global hypokinesis with severe hypokinesis of teh basal to  mid anterior/anteroseptal wall (apical  region not well visualized). The left ventricular internal cavity size was  mildly dilated. Left ventricular diastolic parameters are indeterminate.   2. Right ventricular systolic function is normal. The right ventricular  size is normal. There is normal pulmonary artery systolic pressure. The  estimated right ventricular systolic pressure is 34.4 mmHg.   3. The mitral valve is normal in structure. Mild to moderate mitral valve  regurgitation. No evidence of mitral stenosis. Moderate mitral annular  calcification.   4. The aortic valve is normal in structure. There is moderate  calcification of the aortic valve. Aortic valve regurgitation is not  visualized. Aortic valve sclerosis/calcification is present, unable to  exclude stenosis (gradient not measured.   5. The inferior vena cava is normal in size with greater than 50%  respiratory variability, suggesting right atrial pressure of 3 mmHg.   Patient Profile     CAROLEANN GERTZ is a 83 y.o. female with a history of COPD, HFpEF, HTN, DMII, gastric cancer, and HOH, who is being seen today for the evaluation of Afib w/ RVR, cardiomyopathy  Assessment & Plan    Respiratory and cardiac failure Presenting with COPD exacerbation, -Cardiomyopathy, ejection fraction 25 to 30% with global hypokinesis and wall motion abnormalities concerning for anterior MI -Hypotension in the setting of sepsis, pneumonia, low output cardiomyopathy, on pressors -Has been  maintained on vasopressin , nor epi, dobutamine  with persistent hypotension - On broad-spectrum antibiotics, steroids, bronchodilators,  Use of Lasix  limited secondary to hypotension, low CVP  Non-STEMI In the setting of tachycardia, hypotension, cardiomyopathy ejection fraction 25 to 30% Likely underlying coronary disease -Not stable enough for ischemic workup - Hypotension limiting use of GDMT - Continue aspirin , heparin  infusion - If she recovers from sepsis picture/respiratory failure, could consider ischemic workup   Acute diastolic and systolic CHF Severely reduced ejection fraction 25 to 30% with concern for wall motion abnormality and underlying ischemia - Difficulty weaning from the vent, Not a good candidate for Lasix  given hypotension   Septic shock/community-acquired pneumonia pneumonia, CHF, bilateral pleural effusions Intubated  May 7 at 7 PM for worsening respiratory failure - Failed spontaneous breathing trials yesterday and today - Weaning complicated by tachycardia  Persistent atrial fibrillation Rate elevated this morning in the setting of sedation wean, attempt to wean from the ventilator - Given hypotension, few options for rate control - Could consider amiodarone  infusion if elevated rate persists  - Critically ill, multiorgan system failure  For questions or updates, please contact [facility] Please consult [email or web address] for contact info under        Signed, Fronie Holstein, MD  02/01/2024, 1:41 PM

## 2024-02-01 NOTE — Plan of Care (Signed)
  Problem: Clinical Measurements: Goal: Ability to maintain clinical measurements within normal limits will improve Outcome: Not Progressing Goal: Will remain free from infection Outcome: Progressing Goal: Diagnostic test results will improve Outcome: Progressing Goal: Respiratory complications will improve Outcome: Progressing Goal: Cardiovascular complication will be avoided Outcome: Progressing   Problem: Nutrition: Goal: Adequate nutrition will be maintained Outcome: Progressing   Problem: Elimination: Goal: Will not experience complications related to bowel motility Outcome: Progressing Goal: Will not experience complications related to urinary retention Outcome: Progressing

## 2024-02-01 NOTE — Progress Notes (Signed)
 PHARMACY CONSULT NOTE - ELECTROLYTES  Pharmacy Consult for Electrolyte Monitoring and Replacement   Recent Labs: Height: 5\' 4"  (162.6 cm) Weight: 93.1 kg (205 lb 4 oz) IBW/kg (Calculated) : 54.7 Estimated Creatinine Clearance: 50.9 mL/min (by C-G formula based on SCr of 0.96 mg/dL). Potassium (mmol/L)  Date Value  02/01/2024 3.8   Magnesium  (mg/dL)  Date Value  40/98/1191 2.3   Calcium (mg/dL)  Date Value  47/82/9562 7.5 (L)   Albumin (g/dL)  Date Value  13/04/6577 2.7 (L)   Phosphorus (mg/dL)  Date Value  46/96/2952 3.3   Sodium (mmol/L)  Date Value  02/01/2024 133 (L)   Assessment  Tanya Frye is a 83 y.o. female presenting with septic shock 2/2 CAP. PMH significant for COPD, CHF,  hypertension, T2DM. Pharmacy has been consulted to monitor and replace electrolytes.  Diet: NPO Pertinent medications: Amiodarone , norepinephrine , vasopressin , dobutamine , digoxin   Goal of Therapy: Electrolytes WNL  Plan:  No replacement currently indicated Re-check labs tomorrow AM  Thank you for allowing pharmacy to be a part of this patient's care.  Jeferson Boozer A Kaydence Menard 02/01/2024 7:09 AM

## 2024-02-01 NOTE — Consult Note (Signed)
 PHARMACY - ANTICOAGULATION CONSULT NOTE  Pharmacy Consult for Heparin  Indication: atrial fibrillation  Patient Measurements: Height: 5\' 4"  (162.6 cm) Weight: 93.1 kg (205 lb 4 oz) IBW/kg (Calculated) : 54.7 HEPARIN  DW (KG): 74.7  Labs: Recent Labs    01/29/24 1140 01/29/24 2001 01/29/24 2250 01/30/24 0159 01/30/24 2341 01/31/24 0353 01/31/24 0743 02/01/24 0400  HGB   < >  --   --  11.3*  --  10.4*  --  9.6*  HCT  --   --   --  34.9*  --  32.5*  --  30.5*  PLT  --   --   --  244  --  197  --  174  HEPARINUNFRC  --   --   --  0.77* 0.37  --  0.40 0.39  CREATININE  --   --   --  1.43*  --  1.20*  --  0.96  TROPONINIHS  --  855* 685* 661*  --   --   --   --    < > = values in this interval not displayed.   Estimated Creatinine Clearance: 50.9 mL/min (by C-G formula based on SCr of 0.96 mg/dL).  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 female admitted with Acute Hypoxic Respiratory Failure and Severe Sepsis with Septic Shock due to Community Acquired Pneumonia, along with concern for Acute decompensated CHF and Cardiogenic shock, along with A. Fib w/ RVR now requiring intubation. Pharmacy has been consulted to initiate and dose continuous heparin  infusion.  Goal of Therapy:  Heparin  level 0.3-0.7 units/ml Monitor platelets by anticoagulation protocol: Yes   Plan:  5/11:  HL @ 0400 = 0.39, therapeutic X 3 - Will continue pt on current rate and recheck HL tomorrow with morning labs - CBC daily   Coretta Dexter, PharmD, [facility] 02/01/2024 6:02 AM

## 2024-02-01 NOTE — Progress Notes (Signed)
 NAME:  Tanya Frye, MRN:  [id number], DOB:  1941-08-30, LOS: 4 ADMISSION DATE:  01/28/2024, CONSULTATION DATE:  01/28/2024 REFERRING MD:  Dr. Demetrios Finders, CHIEF COMPLAINT:  Shortness of Breath/Acute Respiratory Distress   Brief Pt Description / Synopsis:  83 y.o. female admitted with Acute Hypoxic Respiratory Failure and Severe Sepsis with Septic Shock due to Community Acquired Pneumonia, along with Acute decompensated HFrEF and Cardiogenic shock, and A. Fib w/ RVR.  Failed trial of BiPAP requiring intubation and mechanical ventilation.  History of Present Illness:  Tanya Frye is a 83 year old female with a past medical history significant for COPD, CHF,  hypertension, diabetes mellitus, and hard of hearing who presents to [facility] ED on 01/28/2024 due to shortness of breath.  Patient is currently unable to contribute to history due to acute respiratory distress, BiPAP, and being extremely hard of hearing, therefore history is obtained from chart review.  Per report she acutely developed respiratory distress earlier this morning.  Upon EMS arrival she was noted to be hypoxic with O2 saturations of 90% on room air which they placed her on CPAP en route to the hospital.  Given her respiratory distress upon arrival, she was transitioned to BiPAP.  ED Course: Initial Vital Signs: Temperature 96.9 F axillary, pulse 128, respiratory rate 33, blood pressure 146/119, SpO2 99% on BiPAP Significant Labs: Sodium 134, glucose 256, BUN 11, creatinine 1.1, BNP 251, lactic acid 4.8, high-sensitivity troponin 8, WBC 22.8 with neutrophilia VBG: pH 7.24/pCO2 51/pO2 58/bicarb 21.9 COVID-19/flu/RSV PCR is negative Imaging Chest X-ray>>IMPRESSION: 1. Mild cardiomegaly and vascular congestion. 2. Small bilateral pleural effusions and bibasilar atelectasis or infiltrate. Medications Administered: 2.5 L of LR boluses, IV azithromycin  and ceftriaxone , 0.5 mg IV Ativan , 4 mg morphine , 5 mg IV Cardizem   Following fluid  resuscitation in the ED, systolic blood pressure decreasing to the 70s and 80s requiring initiation of peripheral Levophed .  PCCM asked to admit for further workup and treatment.  Please see "Significant Hospital Events" section below for full detailed hospital course.  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   CHF (congestive heart failure) (HCC)    Diabetes mellitus without complication (HCC)    HOH (hard of hearing)    extremely HOH   Hypertension    Palpitations    occasional related to meds/heart races    Micro Data:  5/7: COVID/flu/RSV PCR>>negative 5/7: RVP>>negative 5/7: Blood culture x 2>>no growth to date 5/7: Tracheal aspirate>>unable to collect 5/7: MRSA PCR>>negative 5/7: Strep pneumo>>negative  Antimicrobials:   Anti-infectives (From admission, onward)    Start     Dose/Rate Route Frequency Ordered Stop   01/29/24 1800  ceFEPIme  (MAXIPIME ) 2 g in sodium chloride  0.9 % 100 mL IVPB        2 g 200 mL/hr over 30 Minutes Intravenous Every 12 hours 01/29/24 1341     01/29/24 1000  azithromycin  (ZITHROMAX ) 500 mg in sodium chloride  0.9 % 250 mL IVPB  Status:  Discontinued        500 mg 250 mL/hr over 60 Minutes Intravenous Every 24 hours 01/28/24 1343 01/31/24 1031   01/29/24 1000  cefTRIAXone  (ROCEPHIN ) 2 g in sodium chloride  0.9 % 100 mL IVPB  Status:  Discontinued        2 g 200 mL/hr over 30 Minutes Intravenous Every 24 hours 01/28/24 1343 01/29/24 1341   01/28/24 1000  cefTRIAXone  (ROCEPHIN ) 2 g in sodium  chloride 0.9 % 100 mL IVPB        2 g 200 mL/hr over 30 Minutes Intravenous Once 01/28/24 0954 01/28/24 1045   01/28/24 1000  azithromycin  (ZITHROMAX ) 500 mg in sodium chloride  0.9 % 250 mL IVPB        500 mg 250 mL/hr over 60 Minutes Intravenous  Once 01/28/24 0954 01/28/24 1135       Significant Hospital Events: Including procedures, antibiotic start and stop dates in addition to  other pertinent events   5/7: Presents to ED with Acute Respiratory Distress requiring BiPAP.  With A.fib w/ RVR requiring Amiodarone  bolus and drip, developing septic/cardiogenic shock requiring initiation of peripheral Levophed   PCCM asked to admit.  Required intubation later in the evening. 5/8: Remains critically ill with multiorgan failure,  on vent.  Requiring 3 vasopressors (Levo, vaso, Epi).  Cardiology and Palliative Care consulted.  Metabolic and lactic acidosis improving, stop Bicarb gtt.  Leukocytosis worsened, broaden Ceftriaxone  to Cefepime , cultures still pending.  Echo shows LVEF 25-30% with global hypokinesis . 5/9: Overnight unable to tolerate turning due to desaturations.   Remains critically ill on the vent, remains on 3 vasopressors along with Dobutamine .  Plan to obtain internal jugular/Subclavian access to be able to follow Coox, remove femoral lines. Renal function stable, leukocytosis improving.  Diurese with 60 mg IV Lasix  x1 dose, pleural effusions improved with diuresis therefore holding off on thoracentesis. 5/10: Pt remains mechanically intubated FiO2 50%/PEEP 8.  Remains levophed  and vasopressin .  During WUA pt able to follow commands, but failed SBT due to elevated hr and bp along with tachypnea with accessory muscle use  5/11: Pt remains mechanically intubated on minimal settings.  Pending WUA and SBT   Interim History / Subjective:  As outlined above under "Significant Hospital Events" section  Objective   Blood pressure 108/62, pulse 85, temperature 98.6 F (37 C), temperature source Axillary, resp. rate 20, height 5\' 4"  (1.626 m), weight 93.1 kg, SpO2 96%. CVP:  [5 mmHg-22 mmHg] 21 mmHg  Vent Mode: PRVC FiO2 (%):  [30 %-45 %] 30 % Set Rate:  [20 bmp] 20 bmp Vt Set:  [450 mL] 450 mL PEEP:  [5 cmH20-8 cmH20] 5 cmH20 Plateau Pressure:  [19 cmH20-26 cmH20] 19 cmH20   Intake/Output Summary (Last 24 hours) at 02/01/2024 0717 Last data filed at 02/01/2024 0540 Gross  per 24 hour  Intake 3342.13 ml  Output 810 ml  Net 2532.13 ml   Filed Weights   01/30/24 0424 01/31/24 0417 02/01/24 0439  Weight: 92 kg 93 kg 93.1 kg    Examination: General: Acutely-ill appearing female, NAD mechanically intubated  HENT: Atraumatic, normocephalic, neck supple, no JVD, orally intubated Lungs: Faint rhonchi throughout, even, non labored, synchronous with the vent Cardiovascular: Irregular irregular, no m/r/g, 1+ radial/1+ distal pulses, no edema  Abdomen: +BS x4, obese, soft, non tender  Extremities: Chronic trophic changes to bilateral lower extremities Neuro: Sedated, not following commands but withdrawing from stimulation, PERRL  GU: Foley catheter in place draining yellow urine   Resolved Hospital Problem list     Assessment & Plan:   #Acute Metabolic Encephalopathy #Sedation needs in setting of mechanical ventilation PMHx: HOH - Treat metabolic derangements  - Maintain a RASS goal of 0 to -1 - PAD protocol to maintain RASS goal: fentanyl  and versed  as needed to maintain RASS goal - Avoid sedating medications as able - Continue scheduled seroquel  - Daily wake up assessment  #Shock: septic +/- cardiogenic #New onset  atrial fibrillation with rvr #Acute decompensated HFrEF  #Elevated troponin in setting of demand ischemia vs NSTEMI PMHx: HTN, palpitations Echocardiogram 01/29/24: LVEF 25-30%, LV with global hypokinesis with severe hypokinesis of the basal to mid anterior/anteroseptal wall.  Diastolic parameters indeterminate. RV systolic function normal.  RV size normal.  Mild to moderate mitral regurgitation - Continuous telemetry monitoring - Prn levophed  and vasopressin  gtts to maintain map 65 or higher  - Trend lactic acid until normalized - Troponin peaked at 1,032 - Follow Coox panel~dobutamine  @10  mcg/kg/min - Diuresis as BP and renal function permits - Continue amiodarone  and heparin  gtts - TSH normal - Will discontinue solu-cortef   -  Cardiology following, appreciate input  #Acute hypoxic respiratory failure #Possible community acquired pneumonia #Acute COPD exacerbation #Acute decompensated HFrEF #Bilateral pleural effusions  CTa Chest negative for PE, with medium to large bilateral pleural effusions with basilar infiltrates and atelectasis (edema vs superimposed pneumonia) - Full vent support, implement lung protective strategies - Plateau pressures less than 30 cm H20 - Wean FiO2 & PEEP as tolerated to maintain O2 sats 88 to 92% - Follow intermittent CXR & ABG as needed - Spontaneous Breathing Trials when respiratory parameters met and mental status permits - Implement VAP Bundle - Prn bronchodilators  #Acute kidney injury #Anion gap metabolic acidosis due to lactic acidosis - Trend BMP  - Replace electrolytes as indicated  - Strict I&O's  - Avoid nephrotoxic agents as able   #Met SIRS criteria (HR 130's, RR 30's, WBC 22.8) #Severe sepsis #Community acquired pneumonia CTa Abdomen & Pelvis with diffuse fatty liver changes - Trend WBC and monitor fever curve  - Follow cultures  - Continue abx as outlined above pending culture results and sensitivities   #Diabetes mellitus  Hgb A1c is 6.6 - CBG's q4h; Target range of 140 to 180 - SSI - Follow ICU hypo/hyperglycemia protocol  Pt is critically ill with multiorgan failure. Prognosis is guarded, high risk for further decompensation, cardiac arrest, and death.  Given current critical illness superimposed on multiple chronic co-morbidities and advanced age, overall long term prognosis is poor.  Recommend consideration of DNR/DNI status.  Palliative Care following to assist with GOC discussions.  Best Practice (right click and "Reselect all SmartList Selections" daily)   Diet/type: NPO, tube feeds DVT prophylaxis: Heparin  gtt GI prophylaxis: PPI Lines: Right subclavian CVC and right radial arterial line still needed  Foley: yes, and is still needed Code  Status:  full code Last date of multidisciplinary goals of care discussion [02/01/24]  5/11: Will Update pts daughter-in-law regarding pts condition and current plan of care.  All questions answered  Labs   CBC: Recent Labs  Lab 01/28/24 0914 01/29/24 0401 01/30/24 0159 01/31/24 0353 02/01/24 0400  WBC 22.8* 28.1* 23.4* 18.7* 11.7*  NEUTROABS 16.8*  --   --   --   --   HGB 13.1 12.3 11.3* 10.4* 9.6*  HCT 42.7 39.6 34.9* 32.5* 30.5*  MCV 87.5 85.7 82.9 85.8 85.9  PLT 495* 421* 244 197 174    Basic Metabolic Panel: Recent Labs  Lab 01/28/24 1747 01/28/24 2138 01/29/24 0401 01/30/24 0159 01/31/24 0353 01/31/24 1222 02/01/24 0400  NA  --  134* 136 134* 133*  --  133*  K  --  4.2 4.1 3.5 3.3* 3.7 3.8  CL  --  97* 96* 95* 95*  --  97*  CO2  --  12* 17* 24 27  --  26  GLUCOSE  --  376* 342* 283*  228*  --  251*  BUN  --  16 17 24* 26*  --  36*  CREATININE  --  1.59* 1.47* 1.43* 1.20*  --  0.96  CALCIUM  --  7.9* 7.8* 7.4* 7.5*  --  7.5*  MG 1.6*  --  2.0 1.9 2.1  --  2.3  PHOS 8.4*  --  5.6* 4.6 3.7  --  3.3   GFR: Estimated Creatinine Clearance: 50.9 mL/min (by C-G formula based on SCr of 0.96 mg/dL). Recent Labs  Lab 01/28/24 0914 01/28/24 1422 01/29/24 0401 01/29/24 0745 01/29/24 1140 01/29/24 2001 01/30/24 0159 01/31/24 0353 01/31/24 0901 02/01/24 0400  PROCALCITON <0.10  --   --   --   --   --   --   --   --   --   WBC 22.8*  --  28.1*  --   --   --  23.4* 18.7*  --  11.7*  LATICACIDVEN 4.8*   < > 7.6* 5.5* 3.8* 2.1*  --   --  1.5  --    < > = values in this interval not displayed.    Liver Function Tests: Recent Labs  Lab 01/28/24 0914 01/29/24 0401 01/30/24 0159 01/31/24 0353 02/01/24 0400  AST 28 51*  --   --   --   ALT 20 38  --   --   --   ALKPHOS 29* 24*  --   --   --   BILITOT 0.6 0.9  --   --   --   PROT 7.6 6.1*  --   --   --   ALBUMIN 4.0 3.1*  3.1* 2.9* 2.8* 2.7*   No results for input(s): "LIPASE", "AMYLASE" in the last 168  hours. No results for input(s): "AMMONIA" in the last 168 hours.  ABG    Component Value Date/Time   PHART 7.38 01/30/2024 0345   PCO2ART 35 01/30/2024 0345   PO2ART 70 (L) 01/30/2024 0345   HCO3 20.7 01/30/2024 0345   ACIDBASEDEF 3.8 (H) 01/30/2024 0345   O2SAT 98.5 01/31/2024 2136     Coagulation Profile: Recent Labs  Lab 01/28/24 0914  INR 1.0    Cardiac Enzymes: No results for input(s): "CKTOTAL", "CKMB", "CKMBINDEX", "TROPONINI" in the last 168 hours.  HbA1C: HbA1c POC (<> result, manual entry)  Date/Time Value Ref Range Status  04/03/2021 03:07 PM 7.1 4.0 - 5.6 % Final   Hgb A1c MFr Bld  Date/Time Value Ref Range Status  01/30/2024 01:59 AM 6.7 (H) 4.8 - 5.6 % Final    Comment:    (NOTE) Pre diabetes:          5.7%-6.4%  Diabetes:              >6.4%  Glycemic control for   <7.0% adults with diabetes   01/29/2024 04:01 AM 6.6 (H) 4.8 - 5.6 % Final    Comment:    (NOTE) Pre diabetes:          5.7%-6.4%  Diabetes:              >6.4%  Glycemic control for   <7.0% adults with diabetes     CBG: Recent Labs  Lab 01/31/24 0740 01/31/24 1136 01/31/24 1547 01/31/24 1923 02/01/24 0014  GLUCAP 198* 241* 211* 172* 140*    Review of Systems:   Unable to assess due to intubation/sedation/critical illness  Past Medical History:  She,  has a past medical history of Cancer (HCC), CHF (  congestive heart failure) (HCC), Diabetes mellitus without complication (HCC), HOH (hard of hearing), Hypertension, and Palpitations.   Surgical History:   Past Surgical History:  Procedure Laterality Date   ABDOMINAL HYSTERECTOMY  1990   CATARACT EXTRACTION W/PHACO Right 12/20/2020   Procedure: CATARACT EXTRACTION PHACO AND INTRAOCULAR LENS PLACEMENT (IOC) RIGHT DIABETIC 8.13 01:17.6 10.5%;  Surgeon: Annell Kidney, MD;  Location: [facility];  Service: Ophthalmology;  Laterality: Right;   CATARACT EXTRACTION W/PHACO Left 01/03/2021   Procedure: CATARACT  EXTRACTION PHACO AND INTRAOCULAR LENS PLACEMENT (IOC) LEFT DIABETIC  7.37 01:15.6 9.8%;  Surgeon: Annell Kidney, MD;  Location: [facility];  Service: Ophthalmology;  Laterality: Left;   CHOLECYSTECTOMY     DILATION AND CURETTAGE OF UTERUS  before 1990   X2   NECK SURGERY  03/30/2008   Per family, pt broke her neck   STOMACH SURGERY     tumor removed, per family     Social History:   reports that she has never smoked. She has never used smokeless tobacco. She reports that she does not drink alcohol and does not use drugs.   Family History:  Her family history is not on file.   Allergies Allergies  Allergen Reactions   Penicillins Rash     Home Medications  Prior to Admission medications   Medication Sig Start Date End Date Taking? Authorizing Provider  aspirin  EC 81 MG tablet Take 1 tablet by mouth daily.    [provider]  Calcium Carbonate-Vit D-Min (CALCIUM 1200 PO) Take by mouth.    [provider]  furosemide  (LASIX ) 40 MG tablet TAKE 1 TABLET BY MOUTH EVERY DAY 08/27/21   Theron Flavin, MD  glipiZIDE  (GLUCOTROL  XL) 5 MG 24 hr tablet Take 3 tablets (15 mg total) by mouth daily. 11/13/21   Masoud, Javed, MD  levothyroxine (SYNTHROID, LEVOTHROID) 88 MCG tablet Take 88 mcg by mouth every morning. 12/09/17   [provider]  loratadine  (CLARITIN ) 10 MG tablet TAKE 1 TABLET BY MOUTH EVERY DAY 09/24/22   Theron Flavin, MD  losartan  (COZAAR ) 50 MG tablet TAKE 1 TABLET BY MOUTH EVERY DAY 06/10/22   Masoud, Javed, MD  lovastatin (MEVACOR) 10 MG tablet TAKE 1 TABLET ONCE A DAY ORALLY 10/20/17   [provider]  metFORMIN  (GLUCOPHAGE ) 1000 MG tablet TAKE 1 TABLET BY MOUTH TWICE A DAY 12/20/20   Theron Flavin, MD  metoprolol  succinate (TOPROL -XL) 50 MG 24 hr tablet TAKE 1 TABLET BY MOUTH DAILY. TAKE WITH OR IMMEDIATELY FOLLOWING A MEAL. 08/23/22   Theron Flavin, MD  Omega-3 Fatty Acids (FISH OIL) 500 MG CAPS Take 1,000 mg by mouth daily.     [provider]  omeprazole  (PRILOSEC) 20 MG capsule TAKE 1 CAPSULE BY MOUTH EVERY DAY 06/04/21   Theron Flavin, MD     Critical care time: 35 minutes    Janey Meek, [facility]  Pulmonary/Critical Care Pager [phone] (please enter 7 digits) PCCM Consult Pager [phone] (please enter 7 digits)

## 2024-02-01 NOTE — Progress Notes (Signed)
 Palliative Care Progress Note, Assessment & Plan   Patient Name: Tanya Frye       Date: 02/01/2024 DOB: [birth year]  Age: 83 y.o. MRN#: [id number] Attending Physician: [name], MD Primary Care Physician: Pcp, No Admit Date: 01/28/2024  Subjective: Pt lying in bed. Intubated, sedated, reduced to one pressor today. Tanya Frye, daughter in law, Tanya Frye and Tanya Frye (grandsons) at bedside.   HPI: 83 y.o. female  with past medical history of COPD, CHF, HTN, type 2 diabetes, and HOH admitted on 01/28/2024 with acute respiratory distress requiring BiPAP.   Upon arrival to ED, patient found to be in A-fib with RVR and was given amiodarone .  Additionally, patient developed septic and cardiogenic shock requiring pressor support.  PCCM was consulted and later that evening patient required intubation.   Patient remains critically ill with multiorgan failure supported by 1 vasopressor. Leukocytosis has improved from 18.7-> 11.7 in the past 24 hours. LVEF on echo 25-30% with global hypokinesis.   PMT was consulted to support patient and family with goals of care discussions.  Summary of counseling/coordination of care: Extensive chart review completed prior to meeting patient including labs, vital signs, imaging, progress notes, orders, and available advanced directive documents from current and previous encounters.   After reviewing the patient's chart and assessing the patient at bedside, I spoke with patient's family in regards to symptom management and goals of care.   Ill-appearing female in bed. Sedated on mechanical ventilation. She opens eyes to verbal/tactile stimulation but quickly falls back to sleep. She is in no distress. Dr. Lucina Frye at bedside shares with family that patient is more hemodynamically  stable today with improvement in labs.  Tanya Frye, Tanya Frye and Tanya Frye at bedside for WUA and SBT. Tanya Frye and sons still want to continue all medical treatments including full code status. Discussion was had in regards to underlying medical conditions and a CPR event. Tanya Frye shares that her husband was an EMT and they have some medical knowledge. She wishes to keep full code status with goal of patient getting better and returning home. Discussed concern that if the patient was not able to be weaned from vent, there would be later discussion for the possibility of trach/PEG placement. Tanya Frye shares that her mother had the same outcome after surgery and she has thought about that possibility with Tanya Frye as well.   Sedation was discontinued during visit. Assisted with placing patient's hearing aids for waking. Will follow for results of WUA/SBT.   Therapeutic silence and active listening provided for patient's family to share their thoughts and emotions regarding current medical situation.  Emotional support provided.  Physical Exam Vitals reviewed.  Constitutional:      General: She is not in acute distress.    Appearance: She is obese. She is ill-appearing.  HENT:     Head: Normocephalic and atraumatic.     Mouth/Throat:     Comments: OG in place  Pulmonary:     Comments: Mechanical ventilation  Musculoskeletal:     Right lower leg: No edema.     Left lower leg: No edema.  Skin:    General: Skin is warm and dry.     Coloration: Skin is pale.  Recommendations/Plan: FULL CODE/FULL SCOPE status as previously documented    Continue current supportive interventions  PMT to follow as needed          Total Time 35 minutes   Time spent includes: Detailed review of medical records (labs, imaging, vital signs), medically appropriate exam (mental status, respiratory, cardiac, skin), discussed with treatment team, counseling and educating patient, family and staff, documenting clinical information,  medication management and coordination of care.     Tanya Frye, Tanya Frye- [facility] Palliative Medicine Team  02/01/2024 11:16 AM  Office [phone]  Pager [phone]

## 2024-02-02 DIAGNOSIS — Z7189 Other specified counseling: Secondary | ICD-10-CM | POA: Diagnosis not present

## 2024-02-02 DIAGNOSIS — A419 Sepsis, unspecified organism: Secondary | ICD-10-CM | POA: Diagnosis not present

## 2024-02-02 DIAGNOSIS — Z515 Encounter for palliative care: Secondary | ICD-10-CM | POA: Diagnosis not present

## 2024-02-02 DIAGNOSIS — Z789 Other specified health status: Secondary | ICD-10-CM | POA: Diagnosis not present

## 2024-02-02 LAB — GLUCOSE, CAPILLARY
Glucose-Capillary: 167 mg/dL — ABNORMAL HIGH (ref 70–99)
Glucose-Capillary: 160 mg/dL — ABNORMAL HIGH (ref 70–99)
Glucose-Capillary: 139 mg/dL — ABNORMAL HIGH (ref 70–99)
Glucose-Capillary: 151 mg/dL — ABNORMAL HIGH (ref 70–99)
Glucose-Capillary: 167 mg/dL — ABNORMAL HIGH (ref 70–99)
Glucose-Capillary: 150 mg/dL — ABNORMAL HIGH (ref 70–99)

## 2024-02-02 LAB — RENAL FUNCTION PANEL
Albumin: 2.6 g/dL — ABNORMAL LOW (ref 3.5–5.0)
Anion gap: 9 (ref 5–15)
BUN: 44 mg/dL — ABNORMAL HIGH (ref 8–23)
CO2: 28 mmol/L (ref 22–32)
Calcium: 7.5 mg/dL — ABNORMAL LOW (ref 8.9–10.3)
Chloride: 97 mmol/L — ABNORMAL LOW (ref 98–111)
Creatinine, Ser: 1.08 mg/dL — ABNORMAL HIGH (ref 0.44–1.00)
GFR, Estimated: 52 mL/min — ABNORMAL LOW (ref >60)
Glucose, Bld: 150 mg/dL — ABNORMAL HIGH (ref 70–99)
Phosphorus: 3.3 mg/dL (ref 2.5–4.6)
Potassium: 3.5 mmol/L (ref 3.5–5.1)
Sodium: 134 mmol/L — ABNORMAL LOW (ref 135–145)

## 2024-02-02 LAB — CULTURE, BLOOD (ROUTINE X 2)
Culture: NO GROWTH
Culture: NO GROWTH
Special Requests: ADEQUATE

## 2024-02-02 LAB — CBC
HCT: 30.1 % — ABNORMAL LOW (ref 36.0–46.0)
Hemoglobin: 9.4 g/dL — ABNORMAL LOW (ref 12.0–15.0)
MCH: 26.7 pg (ref 26.0–34.0)
MCHC: 31.2 g/dL (ref 30.0–36.0)
MCV: 85.5 fL (ref 80.0–100.0)
Platelets: 175 10*3/uL (ref 150–400)
RBC: 3.52 MIL/uL — ABNORMAL LOW (ref 3.87–5.11)
RDW: 16.5 % — ABNORMAL HIGH (ref 11.5–15.5)
WBC: 11 10*3/uL — ABNORMAL HIGH (ref 4.0–10.5)
nRBC: 0 % (ref 0.0–0.2)

## 2024-02-02 LAB — COOXEMETRY PANEL
Carboxyhemoglobin: 0.9 % (ref 0.5–1.5)
Carboxyhemoglobin: 1 % (ref 0.5–1.5)
Methemoglobin: 0.9 % (ref 0.0–1.5)
O2 Saturation: 74.6 %
O2 Saturation: 51.1 %
Total hemoglobin: 8.5 g/dL — ABNORMAL LOW (ref 12.0–16.0)
Total hemoglobin: 11.5 g/dL — ABNORMAL LOW (ref 12.0–16.0)
Total oxygen content: 73.9 % (ref 0.0–1.5)
Total oxygen content: 50.1 %

## 2024-02-02 LAB — MAGNESIUM: Magnesium: 2.2 mg/dL (ref 1.7–2.4)

## 2024-02-02 LAB — HEPARIN LEVEL (UNFRACTIONATED): Heparin Unfractionated: 0.81 [IU]/mL — ABNORMAL HIGH (ref 0.30–0.70)

## 2024-02-02 LAB — POTASSIUM: Potassium: 3.6 mmol/L (ref 3.5–5.1)

## 2024-02-02 MED ORDER — POTASSIUM CHLORIDE 20 MEQ PO PACK
40.0000 meq | PACK | Freq: Once | ORAL | Status: AC
  Administered 2024-02-02: 40 meq
  Filled 2024-02-02: qty 2

## 2024-02-02 MED ORDER — DEXMEDETOMIDINE HCL IN NACL 400 MCG/100ML IV SOLN
0.0000 ug/kg/h | INTRAVENOUS | Status: DC
  Administered 2024-02-02: 0.4 ug/kg/h via INTRAVENOUS
  Administered 2024-02-02: 0.3 ug/kg/h via INTRAVENOUS
  Administered 2024-02-03 (×3): 1 ug/kg/h via INTRAVENOUS
  Administered 2024-02-04: 0.9 ug/kg/h via INTRAVENOUS
  Filled 2024-02-02 (×6): qty 100

## 2024-02-02 MED ORDER — LEVOTHYROXINE SODIUM 50 MCG PO TABS
75.0000 ug | ORAL_TABLET | Freq: Every day | ORAL | Status: DC
  Administered 2024-02-03 – 2024-02-06 (×4): 75 ug
  Filled 2024-02-02 (×4): qty 2

## 2024-02-02 MED ORDER — DOCUSATE SODIUM 50 MG/5ML PO LIQD
100.0000 mg | Freq: Two times a day (BID) | ORAL | Status: DC | PRN
  Administered 2024-02-03 – 2024-02-04 (×2): 100 mg
  Filled 2024-02-02 (×2): qty 10

## 2024-02-02 MED ORDER — FUROSEMIDE 10 MG/ML IJ SOLN
80.0000 mg | Freq: Two times a day (BID) | INTRAMUSCULAR | Status: DC
  Administered 2024-02-02 – 2024-02-03 (×4): 80 mg via INTRAVENOUS
  Filled 2024-02-02 (×4): qty 8

## 2024-02-02 MED ORDER — FUROSEMIDE 10 MG/ML IJ SOLN: 60.0000 mg | Freq: Once | INTRAMUSCULAR | Status: DC

## 2024-02-02 NOTE — Plan of Care (Signed)
  Problem: Clinical Measurements: Goal: Ability to maintain clinical measurements within normal limits will improve Outcome: Progressing Goal: Will remain free from infection Outcome: Progressing Goal: Diagnostic test results will improve Outcome: Progressing Goal: Respiratory complications will improve Outcome: Progressing Goal: Cardiovascular complication will be avoided Outcome: Progressing   Problem: Nutritional: Goal: Maintenance of adequate nutrition will improve Outcome: Progressing   Problem: Respiratory: Goal: Ability to maintain a clear airway and adequate ventilation will improve Outcome: Progressing

## 2024-02-02 NOTE — TOC Initial Note (Signed)
 Transition of Care [facility]) - Initial/Assessment Note    Patient Details  Name: Tanya Frye MRN: [id number] Date of Birth: [birth year]  [facility]) CM/SW Contact:    [place] L Stepheni Cameron, LCSW Phone Number: 02/02/2024, 11:25 AM  Clinical Narrative:                  CSW observed patient in ICU. Pt currently intubated and unable to participate in the assessment. DIL, J. Alvelo at bedside with the pt. Pt slightly alert as she is making several attempts to extubate herself.   Relative at bedside reports pt is independent. A grandson resides in the home with pt but he is not a caregiver. Relative was married to patient's only son who is now deceased.   No prior HH. J. Chadderdon advised that [facility] referral needed if patient is to discharge home. Patient has mobility devices (cane) at home but relative feels that patient needs a RW.        Patient Goals and CMS Choice            Expected Discharge Plan and Services                                              Prior Living Arrangements/Services     Patient language and need for interpreter reviewed:: Yes (NEXT OF KIN [name] ADVISED PATIENT SPEAKS ENGLISH) Do you feel safe going back to the place where you live?:  (PATIENT UNABLE TO ANSWER. SHE IS INTUBATED)      Need for Family Participation in Patient Care: Yes (Comment) Care giver support system in place?: Yes (comment) (AS PER [name]. FAMILY SUPPORT IN PLACE)   Criminal Activity/Legal Involvement Pertinent to Current Situation/Hospitalization: No - Comment as needed  Activities of Daily Living      Permission Sought/Granted                  Emotional Assessment Appearance:: Other (Comment Required (INTUBATED) Attitude/Demeanor/Rapport: Unable to Assess Affect (typically observed): Unable to Assess   Alcohol / Substance Use: Never Used (PER RELATIVE AT BEDSIDE) Psych Involvement: No (comment)  Admission diagnosis:  Acute respiratory failure  with hypoxia (HCC) [J96.01] Patient Active Problem List   Diagnosis Date Noted   Atrial fibrillation with RVR (HCC) 02/01/2024   Sepsis (HCC) 01/29/2024   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   Non-STEMI (non-ST elevated myocardial infarction) (HCC) 01/29/2024   Community acquired pneumonia 01/29/2024   Cardiogenic shock (HCC) 01/29/2024   Acute respiratory failure (HCC) 01/28/2024   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 [place] [address] Phone: [phone] Fax: [phone]     Social Drivers of Health (SDOH) Social History: SDOH Screenings   Food Insecurity: Patient Unable To Answer (01/29/2024)  Housing: Patient Unable To Answer (01/29/2024)  Transportation Needs: Patient Unable To Answer (01/29/2024)  Utilities: Patient Unable To Answer (01/29/2024)  Alcohol Screen: Low Risk  (09/05/2021)  Depression (PHQ2-9): Low Risk  (09/05/2021)  Financial Resource Strain: Low Risk  (09/05/2021)  Physical Activity: Inactive (09/05/2021)  Social Connections: Patient Unable To Answer (01/29/2024)  Stress: No Stress Concern Present (09/05/2021)  Tobacco Use: Low Risk  (01/28/2024)   SDOH Interventions:     Readmission Risk Interventions     No data to display

## 2024-02-02 NOTE — Consult Note (Signed)
 Advanced Heart Failure Team Consult Note   Primary Physician: Pcp, No Cardiologist:  Timothy Gollan, MD  Reason for Consultation: Acute systolic CHF/cardiogenic shock  HPI:    Tanya Frye is seen today for evaluation of acute systolic CHF/cardiogenic shock at the request of Dr. Jerelene Monday with Cardiology. 83 y.o. female with history of HTN, DM II, gastric cancer in remission, HOH, obesity.   She presented via EMS 05/07 with respiratory distress, hypotension and Afib with RVR. She was started on BIPAP >> later required intubation. Lactic acid 4.8 (peaked > 9), HS troponin 996 and WBCs 22.8K. CT of the chest negative for PE but did show mediium to large b/l pleural effusions and bilateral interstitial prominence. She was given IV fluids and antibiotics in addition to vasopressor support. She was admitted to the ICU for further management of suspected mixed septic/cardiogenic shock. Cardiology consulted for NSTEMI, Afib with RVR and acute on chronic CHF/cardiogenic shock.  Echo with EF 25-30%, RWMA, RV okay  Currently on 12.5 DBA and 0.03 vaso. CVP 16.   Off sedation. Awake on vent.   Daughter at bedside. Reports patient had been doing well up until a couple of days prior to admission. She lives with her grandson but was ambulatory and independent in ADLs.  Home Medications Prior to Admission medications   Medication Sig Start Date End Date Taking? Authorizing Provider  aspirin  EC 81 MG tablet Take 1 tablet by mouth daily.    [provider]  Calcium Carbonate-Vit D-Min (CALCIUM 1200 PO) Take by mouth.    [provider]  furosemide  (LASIX ) 40 MG tablet TAKE 1 TABLET BY MOUTH EVERY DAY 08/27/21   Theron Flavin, MD  glipiZIDE  (GLUCOTROL  XL) 5 MG 24 hr tablet Take 3 tablets (15 mg total) by mouth daily. 11/13/21   Masoud, Javed, MD  levothyroxine (SYNTHROID, LEVOTHROID) 88 MCG tablet Take 88 mcg by mouth every morning. 12/09/17   [provider]  loratadine   (CLARITIN ) 10 MG tablet TAKE 1 TABLET BY MOUTH EVERY DAY 09/24/22   Theron Flavin, MD  losartan  (COZAAR ) 50 MG tablet TAKE 1 TABLET BY MOUTH EVERY DAY 06/10/22   Masoud, Javed, MD  lovastatin (MEVACOR) 10 MG tablet TAKE 1 TABLET ONCE A DAY ORALLY 10/20/17   [provider]  metFORMIN  (GLUCOPHAGE ) 1000 MG tablet TAKE 1 TABLET BY MOUTH TWICE A DAY 12/20/20   Theron Flavin, MD  metoprolol  succinate (TOPROL -XL) 50 MG 24 hr tablet TAKE 1 TABLET BY MOUTH DAILY. TAKE WITH OR IMMEDIATELY FOLLOWING A MEAL. 08/23/22   Theron Flavin, MD  Omega-3 Fatty Acids (FISH OIL) 500 MG CAPS Take 1,000 mg by mouth daily.    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   CHF (congestive heart failure) (HCC)    Diabetes mellitus without complication (HCC)    HOH (hard of hearing)    extremely HOH   Hypertension    Palpitations    occasional related to meds/heart races    Past Surgical History: Past Surgical History:  Procedure Laterality Date   ABDOMINAL HYSTERECTOMY  1990   CATARACT EXTRACTION W/PHACO Right 12/20/2020   Procedure: CATARACT EXTRACTION PHACO AND INTRAOCULAR LENS PLACEMENT (IOC) RIGHT DIABETIC 8.13 01:17.6 10.5%;  Surgeon: Annell Kidney, MD;  Location: [facility];  Service:  Ophthalmology;  Laterality: Right;   CATARACT EXTRACTION W/PHACO Left 01/03/2021   Procedure: CATARACT EXTRACTION PHACO AND INTRAOCULAR LENS PLACEMENT (IOC) LEFT DIABETIC  7.37 01:15.6 9.8%;  Surgeon: Annell Kidney, MD;  Location: [facility];  Service: Ophthalmology;  Laterality: Left;   CHOLECYSTECTOMY     DILATION AND CURETTAGE OF UTERUS  before 1990   X2   NECK SURGERY  03/30/2008   Per family, pt broke her neck   STOMACH SURGERY     tumor removed, per family    Family History: History reviewed. No  pertinent family history.  Social History: Social History   Socioeconomic History   Marital status: Widowed    Spouse name: Not on file   Number of children: 1   Years of education: Not on file   Highest education level: 10th grade  Occupational History   Not on file  Tobacco Use   Smoking status: Never   Smokeless tobacco: Never  Vaping Use   Vaping status: Not on file  Substance and Sexual Activity   Alcohol use: Never   Drug use: Never   Sexual activity: Not Currently  Other Topics Concern   Not on file  Social History Narrative   Not on file   Social Drivers of Health   Financial Resource Strain: Low Risk  (09/05/2021)   Overall Financial Resource Strain (CARDIA)    Difficulty of Paying Living Expenses: Not hard at all  Food Insecurity: Patient Unable To Answer (01/29/2024)   Hunger Vital Sign    Worried About Running Out of Food in the Last Year: Patient unable to answer    Ran Out of Food in the Last Year: Patient unable to answer  Transportation Needs: Patient Unable To Answer (01/29/2024)   PRAPARE - Transportation    Lack of Transportation (Medical): Patient unable to answer    Lack of Transportation (Non-Medical): Patient unable to answer  Physical Activity: Inactive (09/05/2021)   Exercise Vital Sign    Days of Exercise per Week: 0 days    Minutes of Exercise per Session: 0 min  Stress: No Stress Concern Present (09/05/2021)   [employer] of Occupational Health - Occupational Stress Questionnaire    Feeling of Stress : Not at all  Social Connections: Patient Unable To Answer (01/29/2024)   Social Connection and Isolation Panel [NHANES]    Frequency of Communication with Friends and Family: Patient unable to answer    Frequency of Social Gatherings with Friends and Family: Patient unable to answer    Attends Religious Services: Patient unable to answer    Active Member of Clubs or Organizations: Patient unable to answer    Attends Banker  Meetings: Patient unable to answer    Marital Status: Patient unable to answer    Allergies:  Allergies  Allergen Reactions   Penicillins Rash    Objective:    Vital Signs:   Temp:  [98.2 F (36.8 C)-98.8 F (37.1 C)] 98.2 F (36.8 C) (05/12 0400) Pulse Rate:  [70-155] 110 (05/12 0600) Resp:  [10-20] 20 (05/12 0600) BP: (68-108)/(41-63) 100/51 (05/12 0600) SpO2:  [95 %-98 %] 96 % (05/12 0600) Arterial Line BP: (87-132)/(40-61) 127/56 (05/12 0600) FiO2 (%):  [30 %] 30 % (05/12 0742) Weight:  [97.6 kg] 97.6 kg (05/12 0500) Last BM Date : 02/01/24  Weight change: Filed Weights   01/31/24 0417 02/01/24 0439 02/02/24 0500  Weight: 93 kg 93.1 kg 97.6 kg    Intake/Output:   Intake/Output Summary (Last  24 hours) at 02/02/2024 0909 Last data filed at 02/02/2024 2956 Gross per 24 hour  Intake 2857.84 ml  Output 900 ml  Net 1957.84 ml      Physical Exam    General:  Critically ill appearing elderly female ENT: + ETT Neck: thick neck. JVP elevated Cor: Irregular rhythm. No rubs, gallops or murmurs. Lungs: clear Abdomen: obese, soft, nontender, nondistended.  Extremities: 1+ edema Neuro: Awake on vent. Appears anxious. Lines/devices: R subclavian CVC   Telemetry   Afib 90s-100s  Labs   Basic Metabolic Panel: Recent Labs  Lab 01/30/24 0159 01/31/24 0353 01/31/24 1222 02/01/24 0400 02/01/24 1625 02/02/24 0423  NA 134* 133*  --  133* 135 134*  K 3.5 3.3* 3.7 3.8 3.7 3.5  CL 95* 95*  --  97* 95* 97*  CO2 24 27  --  26 29 28   GLUCOSE 283* 228*  --  251* 152* 150*  BUN 24* 26*  --  36* 40* 44*  CREATININE 1.43* 1.20*  --  0.96 1.10* 1.08*  CALCIUM 7.4* 7.5*  --  7.5* 7.7* 7.5*  MG 1.9 2.1  --  2.3 2.3 2.2  PHOS 4.6 3.7  --  3.3 3.6 3.3    Liver Function Tests: Recent Labs  Lab 01/28/24 0914 01/29/24 0401 01/30/24 0159 01/31/24 0353 02/01/24 0400 02/01/24 1625 02/02/24 0423  AST 28 51*  --   --   --   --   --   ALT 20 38  --   --   --   --   --    ALKPHOS 29* 24*  --   --   --   --   --   BILITOT 0.6 0.9  --   --   --   --   --   PROT 7.6 6.1*  --   --   --   --   --   ALBUMIN 4.0 3.1*  3.1* 2.9* 2.8* 2.7* 2.7* 2.6*   No results for input(s): "LIPASE", "AMYLASE" in the last 168 hours. No results for input(s): "AMMONIA" in the last 168 hours.  CBC: Recent Labs  Lab 01/28/24 0914 01/29/24 0401 01/30/24 0159 01/31/24 0353 02/01/24 0400 02/02/24 0423  WBC 22.8* 28.1* 23.4* 18.7* 11.7* 11.0*  NEUTROABS 16.8*  --   --   --   --   --   HGB 13.1 12.3 11.3* 10.4* 9.6* 9.4*  HCT 42.7 39.6 34.9* 32.5* 30.5* 30.1*  MCV 87.5 85.7 82.9 85.8 85.9 85.5  PLT 495* 421* 244 197 174 175    Cardiac Enzymes: No results for input(s): "CKTOTAL", "CKMB", "CKMBINDEX", "TROPONINI" in the last 168 hours.  BNP: BNP (last 3 results) Recent Labs    01/28/24 0914  BNP 251.9*    ProBNP (last 3 results) No results for input(s): "PROBNP" in the last 8760 hours.   CBG: Recent Labs  Lab 02/01/24 1548 02/01/24 1932 02/01/24 2338 02/02/24 0313 02/02/24 0717  GLUCAP 137* 150* 143* 167* 160*    Coagulation Studies: No results for input(s): "LABPROT", "INR" in the last 72 hours.   Imaging   No results found.   Medications:     Current Medications:  aspirin   81 mg Per Tube Daily   Chlorhexidine  Gluconate Cloth  6 each Topical Daily   enoxaparin (LOVENOX) injection  1 mg/kg Subcutaneous BID   free water   30 mL Per Tube Q4H   insulin  aspart  0-20 Units Subcutaneous Q4H   insulin  aspart  9  Units Subcutaneous Q4H   insulin  glargine-yfgn  10 Units Subcutaneous BID   levothyroxine  88 mcg Per Tube Q0600   multivitamin with minerals  1 tablet Per Tube Daily   mouth rinse  15 mL Mouth Rinse Q2H   pantoprazole  (PROTONIX ) IV  40 mg Intravenous Daily   QUEtiapine  25 mg Per Tube BID   sodium chloride  flush  10-40 mL Intracatheter Q12H    Infusions:  amiodarone  30 mg/hr (02/02/24 0716)   dexmedetomidine  (PRECEDEX ) IV infusion  Stopped (01/30/24 1513)   dextrose      DOBUTamine  12.5 mcg/kg/min (02/02/24 0716)   feeding supplement (VITAL HIGH PROTEIN) 55 mL/hr at 02/02/24 0716   fentaNYL  infusion INTRAVENOUS 175 mcg/hr (02/02/24 0716)   midazolam  0.5 mg/hr (02/02/24 0716)   norepinephrine  (LEVOPHED ) Adult infusion Stopped (02/01/24 8119)   vasopressin  0.03 Units/min (02/02/24 0716)      Patient Profile   83 y.o. female with history of obesity, DM II, HTN, HOH, hypothyroidism, gastric cancer in remission. Admitted with profound mixed septic/cardiogenic shock, acute respiratory failure and Afib with RVR  Assessment/Plan   Shock -Suspect mixed septic (possible PNA) and cardiogenic shock -Lactic acid > 9, now cleared -Treated with empiric abx for possible PNA - On DBA 12.5 + Vaso 0.03 this am. CO-OX 51% with Fick CI of 2.1 after DBA turned down to 5 mcg/kg/min. SVR ~ 1200. SBP 140s on Aline. Stopped Vaso. - Uncertain if Afib with RVR vs stress mediated vs ischemic etiology for CM >> shock  Acute HFrEF - New. No prior cardiac history per patient's daughter - Echo 05/25: EF 25-30%, severe HK basal to mid anterior/anteroseptal wall, RV okay - Volume up today. CVP 17. Start IV lasix  80 BID - GDMT once off inotropes/pressors - Can eventually consider R/LHC  and attempt at DCCV once she recovers  Afib with RVR - Newly diagnosed. Unclear if consequence of or etiology of CHF - Rate reasonably controlled on amiodarone  gtt at 30/hr - Anticoagulated with Lovenox, eventually switch to DOAC - Consider TEE/DCCV prior to discharge - TSH okay. On synthroid at home  Acute respiratory failure - In setting of suspected CAP and acute HFrEF - Vent management per CCM - Diuresis today to aid with extubation  DM II - A1c 6.7% - Per primary  Length of Stay: 5  Linkoln Alkire N, PA-C  02/02/2024, 9:09 AM    Advanced Heart Failure Team Pager [phone] (M-F; 7a - 5p)  Please contact [facility] for night-coverage  after hours (4p -7a ) and weekends on [email or web address]

## 2024-02-02 NOTE — Progress Notes (Signed)
 PHARMACY CONSULT NOTE - ELECTROLYTES  Pharmacy Consult for Electrolyte Monitoring and Replacement   Recent Labs: Height: 5' 4.02" (162.6 cm) Weight: 97.6 kg (215 lb 2.7 oz) IBW/kg (Calculated) : 54.74 Estimated Creatinine Clearance: 46.4 mL/min (A) (by C-G formula based on SCr of 1.08 mg/dL (H)). Potassium (mmol/L)  Date Value  02/02/2024 3.5   Magnesium  (mg/dL)  Date Value  40/98/1191 2.2   Calcium (mg/dL)  Date Value  47/82/9562 7.5 (L)   Albumin (g/dL)  Date Value  13/04/6577 2.6 (L)   Phosphorus (mg/dL)  Date Value  46/96/2952 3.3   Sodium (mmol/L)  Date Value  02/02/2024 134 (L)   Assessment  Tanya Frye is a 83 y.o. female presenting with septic shock 2/2 CAP. PMH significant for COPD, CHF,  hypertension, T2DM. Pharmacy has been consulted to monitor and replace electrolytes.  Diet: NPO Pertinent medications: Amiodarone , norepinephrine , vasopressin , dobutamine , digoxin , furosemide   Goal of Therapy:  Potassium 4.0 - 5.1 mmol/L Magnesium  2.0 - 2.4 mg/dL All Other Electrolytes WNL  Plan:  40 mEq KCl per tube x 1 Re-check labs tomorrow AM  Thank you for allowing pharmacy to be a part of this patient's care.  Adalberto Acton 02/02/2024 7:06 AM

## 2024-02-02 NOTE — Progress Notes (Signed)
Palliative Care Progress Note, Assessment & Plan   Patient Name: Tanya Frye       Date: 02/02/2024 DOB: 28-Apr-1941  Age: 83 y.o. MRN#: [id number] Attending Physician: Cleve Dale, MD Primary Care Physician: Pcp, No Admit Date: 01/28/2024  Subjective: Pt lying in bed. She is alert. Denies pain by shaking head no.   HPI: 83 y.o. female  with past medical history of COPD, CHF, HTN, type 2 diabetes, and HOH admitted on 01/28/2024 with acute respiratory distress requiring BiPAP.   Upon arrival to ED, patient found to be in A-fib with RVR and was given amiodarone .  Additionally, patient developed septic and cardiogenic shock requiring pressor support.  PCCM was consulted and later that evening patient required intubation.   Patient remains critically ill with multiorgan failure supported by 1 vasopressor. Leukocytosis has improved from 18.7-> 11.7 in the past 24 hours. LVEF on echo 25-30% with global hypokinesis.   PMT was consulted to support patient and family with goals of care discussions.  Summary of counseling/coordination of care: Extensive chart review completed prior to meeting patient including labs, vital signs, imaging, progress notes, orders, and available advanced directive documents from current and previous encounters.   After reviewing the patient's chart and assessing the patient at bedside, I spoke with Jodie in regards to symptom management and goals of care.   Ill-appearing female in bed, ET in place, awake. She shakes head no when questioned about pain. RN notifies that patient is currently breathing on her own. No sedation, 1 pressor. She is no distress.   Jodie reports that her mother in law is doing much better today with the hope of taking her off breathing machine. She shares that  she wants patient to get better, confirming continuance of all medical interventions.   Therapeutic silence and active listening provided for Jodie to share her thoughts and emotions regarding current medical situation.  Emotional support provided.  Physical Exam Vitals reviewed.  Constitutional:      General: She is not in acute distress.    Appearance: She is ill-appearing.  HENT:     Head: Normocephalic and atraumatic.     Mouth/Throat:     Comments: OG in place Pulmonary:     Effort: Pulmonary effort is normal. No respiratory distress.     Comments: ET tube in place, RN reports pt is breathing on her own Musculoskeletal:     Right lower leg: No edema.     Left lower leg: No edema.  Skin:    General: Skin is warm and dry.  Neurological:     Mental Status: She is alert.       Recommendations/Plan: FULL CODE/FULL SCOPE status as previously documented    Continue current supportive interventions  PMT to follow peripherally and available as needed    Total Time 25 minutes   Time spent includes: Detailed review of medical records (labs, imaging, vital signs), medically appropriate exam (mental status, respiratory, cardiac, skin), discussed with treatment team, counseling and educating patient, family and staff, documenting clinical information, medication management and coordination of care.     Ina Manas, Joyice Nodal- [facility] Palliative Medicine Team  02/02/2024 11:45 AM  Office [phone]  Pager  [phone]     

## 2024-02-02 NOTE — Progress Notes (Signed)
 NAME:  Tanya Frye, MRN:  [id number], DOB:  01-Sep-1941, LOS: 5 ADMISSION DATE:  01/28/2024, CONSULTATION DATE:  01/28/2024 REFERRING MD:  Dr. Demetrios Finders, CHIEF COMPLAINT:  Shortness of Breath/Acute Respiratory Distress   Brief Pt Description / Synopsis:  83 y.o. female admitted with Acute Hypoxic Respiratory Failure and Severe Sepsis with Septic Shock due to Community Acquired Pneumonia, along with Acute decompensated HFrEF and Cardiogenic shock, and A. Fib w/ RVR.  Failed trial of BiPAP requiring intubation and mechanical ventilation.  History of Present Illness:  Tanya Frye is a 83 year old female with a past medical history significant for COPD, CHF,  hypertension, diabetes mellitus, and hard of hearing who presents to [facility] ED on 01/28/2024 due to shortness of breath.  Patient is currently unable to contribute to history due to acute respiratory distress, BiPAP, and being extremely hard of hearing, therefore history is obtained from chart review.  Per report she acutely developed respiratory distress earlier this morning.  Upon EMS arrival she was noted to be hypoxic with O2 saturations of 90% on room air which they placed her on CPAP en route to the hospital.  Given her respiratory distress upon arrival, she was transitioned to BiPAP.  ED Course: Initial Vital Signs: Temperature 96.9 F axillary, pulse 128, respiratory rate 33, blood pressure 146/119, SpO2 99% on BiPAP Significant Labs: Sodium 134, glucose 256, BUN 11, creatinine 1.1, BNP 251, lactic acid 4.8, high-sensitivity troponin 8, WBC 22.8 with neutrophilia VBG: pH 7.24/pCO2 51/pO2 58/bicarb 21.9 COVID-19/flu/RSV PCR is negative Imaging Chest X-ray>>IMPRESSION: 1. Mild cardiomegaly and vascular congestion. 2. Small bilateral pleural effusions and bibasilar atelectasis or infiltrate. Medications Administered: 2.5 L of LR boluses, IV azithromycin  and ceftriaxone , 0.5 mg IV Ativan , 4 mg morphine , 5 mg IV Cardizem   Following fluid  resuscitation in the ED, systolic blood pressure decreasing to the 70s and 80s requiring initiation of peripheral Levophed .  PCCM asked to admit for further workup and treatment.  Please see "Significant Hospital Events" section below for full detailed hospital course.  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   CHF (congestive heart failure) (HCC)    Diabetes mellitus without complication (HCC)    HOH (hard of hearing)    extremely HOH   Hypertension    Palpitations    occasional related to meds/heart races    Micro Data:  5/7: COVID/flu/RSV PCR>>negative 5/7: RVP>>negative 5/7: Blood culture x 2>>no growth  5/7: Tracheal aspirate>>unable to collect 5/7: MRSA PCR>>negative 5/7: Strep pneumo & Legionella urinary antigens>>negative  Antimicrobials:   Anti-infectives (From admission, onward)    Start     Dose/Rate Route Frequency Ordered Stop   02/01/24 1500  cefTRIAXone  (ROCEPHIN ) 2 g in sodium chloride  0.9 % 100 mL IVPB        2 g 200 mL/hr over 30 Minutes Intravenous  Once 02/01/24 1407 02/01/24 1534   01/29/24 1800  ceFEPIme  (MAXIPIME ) 2 g in sodium chloride  0.9 % 100 mL IVPB  Status:  Discontinued        2 g 200 mL/hr over 30 Minutes Intravenous Every 12 hours 01/29/24 1341 02/01/24 1407   01/29/24 1000  azithromycin  (ZITHROMAX ) 500 mg in sodium chloride  0.9 % 250 mL IVPB  Status:  Discontinued        500 mg 250 mL/hr over 60 Minutes Intravenous Every 24 hours 01/28/24 1343 01/31/24 1031   01/29/24 1000  cefTRIAXone  (ROCEPHIN ) 2 g  in sodium chloride  0.9 % 100 mL IVPB  Status:  Discontinued        2 g 200 mL/hr over 30 Minutes Intravenous Every 24 hours 01/28/24 1343 01/29/24 1341   01/28/24 1000  cefTRIAXone  (ROCEPHIN ) 2 g in sodium chloride  0.9 % 100 mL IVPB        2 g 200 mL/hr over 30 Minutes Intravenous Once 01/28/24 0954 01/28/24 1045   01/28/24 1000  azithromycin  (ZITHROMAX ) 500 mg in sodium  chloride 0.9 % 250 mL IVPB        500 mg 250 mL/hr over 60 Minutes Intravenous  Once 01/28/24 0954 01/28/24 1135       Significant Hospital Events: Including procedures, antibiotic start and stop dates in addition to other pertinent events   5/7: Presents to ED with Acute Respiratory Distress requiring BiPAP.  With A.fib w/ RVR requiring Amiodarone  bolus and drip, developing septic/cardiogenic shock requiring initiation of peripheral Levophed   PCCM asked to admit.  Required intubation later in the evening. 5/8: Remains critically ill with multiorgan failure,  on vent.  Requiring 3 vasopressors (Levo, vaso, Epi).  Cardiology and Palliative Care consulted.  Metabolic and lactic acidosis improving, stop Bicarb gtt.  Leukocytosis worsened, broaden Ceftriaxone  to Cefepime , cultures still pending.  Echo shows LVEF 25-30% with global hypokinesis . 5/9: Overnight unable to tolerate turning due to desaturations.   Remains critically ill on the vent, remains on 3 vasopressors along with Dobutamine .  Plan to obtain internal jugular/Subclavian access to be able to follow Coox, remove femoral lines. Renal function stable, leukocytosis improving.  Diurese with 60 mg IV Lasix  x1 dose, pleural effusions improved with diuresis therefore holding off on thoracentesis. 5/10: Pt remains mechanically intubated FiO2 50%/PEEP 8.  Remains levophed  and vasopressin .  During WUA pt able to follow commands, but failed SBT due to elevated hr and bp along with tachypnea with accessory muscle use  5/11: Pt remains mechanically intubated on minimal settings.  Pending WUA and SBT ~ failed SBT due to increased WOB and A.fib w/ RVR. 5/12: No significant events overnight.  Remains on Vasopressin .  On minimal vent support, plan for WUQ & SBT as tolerated.  Renal function remains stable, UOP 900 cc last 24 hrs (net + 13L), will diurese with 60 mg IV Lasix  x1 dose.  Coox is 67, remains on Dobutamine , Advanced CHF consulted.  Interim  History / Subjective:  As outlined above under "Significant Hospital Events" section  Objective   Blood pressure (!) 100/51, pulse (!) 110, temperature 98.2 F (36.8 C), temperature source Oral, resp. rate 20, height 5' 4.02" (1.626 m), weight 97.6 kg, SpO2 96%. CVP:  [7 mmHg-19 mmHg] 18 mmHg  Vent Mode: PRVC FiO2 (%):  [30 %] 30 % Set Rate:  [20 bmp] 20 bmp Vt Set:  [450 mL] 450 mL PEEP:  [5 cmH20] 5 cmH20 Plateau Pressure:  [19 cmH20] 19 cmH20   Intake/Output Summary (Last 24 hours) at 02/02/2024 0828 Last data filed at 02/02/2024 0716 Gross per 24 hour  Intake 2857.84 ml  Output 900 ml  Net 1957.84 ml   Filed Weights   01/31/24 0417 02/01/24 0439 02/02/24 0500  Weight: 93 kg 93.1 kg 97.6 kg    Examination: General: Acutely-ill appearing female, NAD mechanically intubated  HENT: Atraumatic, normocephalic, neck supple, no JVD, orally intubated Lungs: Faint rhonchi throughout, even, non labored, synchronous with the vent Cardiovascular: Irregular irregular, no m/r/g, 1+ radial/1+ distal pulses, no edema  Abdomen: +BS x4, obese, soft, non tender  Extremities: Chronic trophic changes to bilateral lower extremities Neuro: Lightly Sedated, wakes up to voice and follows simple commands, nodding to questions, no focal deficits noted, PERRL  GU: Foley catheter in place draining clear yellow urine   Resolved Hospital Problem list     Assessment & Plan:   #Acute Metabolic Encephalopathy #Sedation needs in setting of mechanical ventilation PMHx: HOH - Treat metabolic derangements as outlined below -Maintain a RASS goal of 0 to -1 -Fentanyl  and Versed  as needed to maintain RASS goal -Avoid sedating medications as able -Daily wake up assessment ~ utilize Precedex  for WUA/SBT  #Shock: septic +/- cardiogenic #New onset atrial fibrillation with rvr #Acute decompensated HFrEF  #Elevated troponin in setting of demand ischemia vs NSTEMI PMHx: HTN, palpitations Echocardiogram  01/29/24: LVEF 25-30%, LV with global hypokinesis with severe hypokinesis of the basal to mid anterior/anteroseptal wall.  Diastolic parameters indeterminate. RV systolic function normal.  RV size normal.  Mild to moderate mitral regurgitation -Continuous cardiac monitoring -Maintain MAP >65 -Vasopressors as needed to maintain MAP goal -Continue Dobutamine  ~ follow Coox panel ~ Advanced CHF consulted, appreciate input -Lactic acid has normalized -Troponin peaked at 1,032 -Echocardiogram pending -Diuresis as BP and renal function permits ~ will give 60 mg IV Lasix  x1 dose on 5/12 -Cardiology following, appreciate input ~ Continue amiodarone  and heparin  gtts -TSH normal  #Acute hypoxic respiratory failure #Possible community acquired pneumonia #Acute COPD exacerbation #Acute decompensated HFrEF #Bilateral pleural effusions  CTa Chest negative for PE, with medium to large bilateral pleural effusions with basilar infiltrates and atelectasis (edema vs superimposed pneumonia) -Full vent support, implement lung protective strategies -Plateau pressures less than 30 cm H20 -Wean FiO2 & PEEP as tolerated to maintain O2 sats 88 to 92% -Follow intermittent Chest X-ray & ABG as needed -Spontaneous Breathing Trials when respiratory parameters met and mental status permits -Implement VAP Bundle -Prn Bronchodilators -Completed course of ABX as above -Diuresis as BP and renal function permits ~ give 60 mg IV lasix  x1 dose on 5/12  #Acute kidney injury #Anion gap metabolic acidosis due to lactic acidosis ~ RESOLVED #Mild Hypokalemia -Monitor I&O's / urinary output -Follow BMP -Ensure adequate renal perfusion -Avoid nephrotoxic agents as able -Replace electrolytes as indicated ~ Pharmacy following for assistance with electrolyte replacement  #Met SIRS criteria (HR 130's, RR 30's, WBC 22.8) #Severe sepsis #Community acquired pneumonia ~ TREATED  CTa Abdomen & Pelvis with diffuse fatty liver  changes - Trend WBC and monitor fever curve  - Follow cultures  - Continue abx as outlined above pending culture results and sensitivities   #Diabetes mellitus  Hgb A1c is 6.6 - CBG's q4h; Target range of 140 to 180 - SSI - Follow ICU hypo/hyperglycemia protocol   Pt is critically ill with multiorgan failure. Prognosis is guarded, high risk for further decompensation, cardiac arrest, and death.  Given current critical illness superimposed on multiple chronic co-morbidities and advanced age, overall long term prognosis is poor.  Recommend consideration of DNR/DNI status.  Palliative Care following to assist with GOC discussions.   Best Practice (right click and "Reselect all SmartList Selections" daily)   Diet/type: NPO, tube feeds DVT prophylaxis: Heparin  gtt GI prophylaxis: PPI Lines: Right subclavian CVC and right radial arterial line still needed  Foley: yes, and is still needed Code Status:  full code Last date of multidisciplinary goals of care discussion [02/02/24]  5/12: Updated pts daughter-in-law Jodie at bedside regarding pts condition and current plan of care.  All questions answered   Labs  CBC: Recent Labs  Lab 01/28/24 0914 01/29/24 0401 01/30/24 0159 01/31/24 0353 02/01/24 0400 02/02/24 0423  WBC 22.8* 28.1* 23.4* 18.7* 11.7* 11.0*  NEUTROABS 16.8*  --   --   --   --   --   HGB 13.1 12.3 11.3* 10.4* 9.6* 9.4*  HCT 42.7 39.6 34.9* 32.5* 30.5* 30.1*  MCV 87.5 85.7 82.9 85.8 85.9 85.5  PLT 495* 421* 244 197 174 175    Basic Metabolic Panel: Recent Labs  Lab 01/30/24 0159 01/31/24 0353 01/31/24 1222 02/01/24 0400 02/01/24 1625 02/02/24 0423  NA 134* 133*  --  133* 135 134*  K 3.5 3.3* 3.7 3.8 3.7 3.5  CL 95* 95*  --  97* 95* 97*  CO2 24 27  --  26 29 28   GLUCOSE 283* 228*  --  251* 152* 150*  BUN 24* 26*  --  36* 40* 44*  CREATININE 1.43* 1.20*  --  0.96 1.10* 1.08*  CALCIUM 7.4* 7.5*  --  7.5* 7.7* 7.5*  MG 1.9 2.1  --  2.3 2.3 2.2  PHOS 4.6  3.7  --  3.3 3.6 3.3   GFR: Estimated Creatinine Clearance: 46.4 mL/min (A) (by C-G formula based on SCr of 1.08 mg/dL (H)). Recent Labs  Lab 01/28/24 0914 01/28/24 1422 01/29/24 0745 01/29/24 1140 01/29/24 2001 01/30/24 0159 01/31/24 0353 01/31/24 0901 02/01/24 0400 02/02/24 0423  PROCALCITON <0.10  --   --   --   --   --   --   --   --   --   WBC 22.8*   < >  --   --   --  23.4* 18.7*  --  11.7* 11.0*  LATICACIDVEN 4.8*   < > 5.5* 3.8* 2.1*  --   --  1.5  --   --    < > = values in this interval not displayed.    Liver Function Tests: Recent Labs  Lab 01/28/24 0914 01/29/24 0401 01/30/24 0159 01/31/24 0353 02/01/24 0400 02/01/24 1625 02/02/24 0423  AST 28 51*  --   --   --   --   --   ALT 20 38  --   --   --   --   --   ALKPHOS 29* 24*  --   --   --   --   --   BILITOT 0.6 0.9  --   --   --   --   --   PROT 7.6 6.1*  --   --   --   --   --   ALBUMIN 4.0 3.1*  3.1* 2.9* 2.8* 2.7* 2.7* 2.6*   No results for input(s): "LIPASE", "AMYLASE" in the last 168 hours. No results for input(s): "AMMONIA" in the last 168 hours.  ABG    Component Value Date/Time   PHART 7.38 01/30/2024 0345   PCO2ART 35 01/30/2024 0345   PO2ART 70 (L) 01/30/2024 0345   HCO3 20.7 01/30/2024 0345   ACIDBASEDEF 3.8 (H) 01/30/2024 0345   O2SAT 67.2 02/01/2024 1626     Coagulation Profile: Recent Labs  Lab 01/28/24 0914  INR 1.0    Cardiac Enzymes: No results for input(s): "CKTOTAL", "CKMB", "CKMBINDEX", "TROPONINI" in the last 168 hours.  HbA1C: HbA1c POC (<> result, manual entry)  Date/Time Value Ref Range Status  04/03/2021 03:07 PM 7.1 4.0 - 5.6 % Final   Hgb A1c MFr Bld  Date/Time Value Ref Range Status  01/30/2024 01:59 AM 6.7 (H)  4.8 - 5.6 % Final    Comment:    (NOTE) Pre diabetes:          5.7%-6.4%  Diabetes:              >6.4%  Glycemic control for   <7.0% adults with diabetes   01/29/2024 04:01 AM 6.6 (H) 4.8 - 5.6 % Final    Comment:    (NOTE) Pre  diabetes:          5.7%-6.4%  Diabetes:              >6.4%  Glycemic control for   <7.0% adults with diabetes     CBG: Recent Labs  Lab 02/01/24 1548 02/01/24 1932 02/01/24 2338 02/02/24 0313 02/02/24 0717  GLUCAP 137* 150* 143* 167* 160*    Review of Systems:   Unable to assess due to intubation/sedation/critical illness  Past Medical History:  She,  has a past medical history of Cancer (HCC), CHF (congestive heart failure) (HCC), Diabetes mellitus without complication (HCC), HOH (hard of hearing), Hypertension, and Palpitations.   Surgical History:   Past Surgical History:  Procedure Laterality Date   ABDOMINAL HYSTERECTOMY  1990   CATARACT EXTRACTION W/PHACO Right 12/20/2020   Procedure: CATARACT EXTRACTION PHACO AND INTRAOCULAR LENS PLACEMENT (IOC) RIGHT DIABETIC 8.13 01:17.6 10.5%;  Surgeon: Annell Kidney, MD;  Location: [facility];  Service: Ophthalmology;  Laterality: Right;   CATARACT EXTRACTION W/PHACO Left 01/03/2021   Procedure: CATARACT EXTRACTION PHACO AND INTRAOCULAR LENS PLACEMENT (IOC) LEFT DIABETIC  7.37 01:15.6 9.8%;  Surgeon: Annell Kidney, MD;  Location: [facility];  Service: Ophthalmology;  Laterality: Left;   CHOLECYSTECTOMY     DILATION AND CURETTAGE OF UTERUS  before 1990   X2   NECK SURGERY  03/30/2008   Per family, pt broke her neck   STOMACH SURGERY     tumor removed, per family     Social History:   reports that she has never smoked. She has never used smokeless tobacco. She reports that she does not drink alcohol and does not use drugs.   Family History:  Her family history is not on file.   Allergies Allergies  Allergen Reactions   Penicillins Rash     Home Medications  Prior to Admission medications   Medication Sig Start Date End Date Taking? Authorizing Provider  aspirin  EC 81 MG tablet Take 1 tablet by mouth daily.    [provider]  Calcium Carbonate-Vit D-Min (CALCIUM 1200 PO) Take  by mouth.    [provider]  furosemide  (LASIX ) 40 MG tablet TAKE 1 TABLET BY MOUTH EVERY DAY 08/27/21   Theron Flavin, MD  glipiZIDE  (GLUCOTROL  XL) 5 MG 24 hr tablet Take 3 tablets (15 mg total) by mouth daily. 11/13/21   Masoud, Javed, MD  levothyroxine (SYNTHROID, LEVOTHROID) 88 MCG tablet Take 88 mcg by mouth every morning. 12/09/17   [provider]  loratadine  (CLARITIN ) 10 MG tablet TAKE 1 TABLET BY MOUTH EVERY DAY 09/24/22   Theron Flavin, MD  losartan  (COZAAR ) 50 MG tablet TAKE 1 TABLET BY MOUTH EVERY DAY 06/10/22   Masoud, Javed, MD  lovastatin (MEVACOR) 10 MG tablet TAKE 1 TABLET ONCE A DAY ORALLY 10/20/17   [provider]  metFORMIN  (GLUCOPHAGE ) 1000 MG tablet TAKE 1 TABLET BY MOUTH TWICE A DAY 12/20/20   Masoud, Javed, MD  metoprolol  succinate (TOPROL -XL) 50 MG 24 hr tablet TAKE 1 TABLET BY MOUTH DAILY. TAKE WITH OR IMMEDIATELY FOLLOWING A MEAL.  08/23/22   Theron Flavin, MD  Omega-3 Fatty Acids (FISH OIL) 500 MG CAPS Take 1,000 mg by mouth daily.    [provider]  omeprazole  (PRILOSEC) 20 MG capsule TAKE 1 CAPSULE BY MOUTH EVERY DAY 06/04/21   Theron Flavin, MD     Critical care time: 40 minutes    Cherylann Corpus, AGACNP-BC [facility] Prefer epic messenger for cross cover needs If after hours, please call E-link

## 2024-02-02 NOTE — Plan of Care (Signed)
  Problem: Education: Goal: Knowledge of General Education information will improve Description: Including pain rating scale, medication(s)/side effects and non-pharmacologic comfort measures Outcome: Progressing   Problem: Health Behavior/Discharge Planning: Goal: Ability to manage health-related needs will improve Outcome: Progressing   Problem: Clinical Measurements: Goal: Diagnostic test results will improve Outcome: Progressing Goal: Respiratory complications will improve Outcome: Progressing Goal: Cardiovascular complication will be avoided Outcome: Progressing   Problem: Activity: Goal: Risk for activity intolerance will decrease Outcome: Progressing   Problem: Nutrition: Goal: Adequate nutrition will be maintained Outcome: Progressing   Problem: Coping: Goal: Level of anxiety will decrease Outcome: Progressing   Problem: Pain Managment: Goal: General experience of comfort will improve and/or be controlled Outcome: Progressing   Problem: Metabolic: Goal: Ability to maintain appropriate glucose levels will improve Outcome: Progressing   Problem: Nutritional: Goal: Maintenance of adequate nutrition will improve Outcome: Progressing   Problem: Skin Integrity: Goal: Risk for impaired skin integrity will decrease Outcome: Progressing   Problem: Tissue Perfusion: Goal: Adequacy of tissue perfusion will improve Outcome: Progressing   Problem: Activity: Goal: Ability to tolerate increased activity will improve Outcome: Progressing   Problem: Respiratory: Goal: Ability to maintain a clear airway and adequate ventilation will improve Outcome: Progressing   Problem: Urinary Elimination: Goal: Ability to achieve and maintain adequate renal perfusion and functioning will improve Outcome: Progressing

## 2024-02-02 NOTE — Progress Notes (Signed)
 Heart Failure Navigator Progress Note  Assessed for Heart & Vascular TOC clinic readiness.  Patient does not meet criteria due to current Advanced Heart Failure Team patient.   Navigator will sign off at this time.  Roxy Horseman, RN, BSN [facility] Heart Failure Navigator Secure Chat Only

## 2024-02-03 ENCOUNTER — Inpatient Hospital Stay

## 2024-02-03 DIAGNOSIS — A419 Sepsis, unspecified organism: Secondary | ICD-10-CM | POA: Diagnosis not present

## 2024-02-03 DIAGNOSIS — I5021 Acute systolic (congestive) heart failure: Secondary | ICD-10-CM

## 2024-02-03 DIAGNOSIS — R570 Cardiogenic shock: Secondary | ICD-10-CM | POA: Diagnosis not present

## 2024-02-03 DIAGNOSIS — J9601 Acute respiratory failure with hypoxia: Secondary | ICD-10-CM | POA: Diagnosis not present

## 2024-02-03 DIAGNOSIS — J9600 Acute respiratory failure, unspecified whether with hypoxia or hypercapnia: Secondary | ICD-10-CM | POA: Diagnosis not present

## 2024-02-03 DIAGNOSIS — J189 Pneumonia, unspecified organism: Secondary | ICD-10-CM | POA: Diagnosis not present

## 2024-02-03 DIAGNOSIS — G9341 Metabolic encephalopathy: Secondary | ICD-10-CM | POA: Diagnosis not present

## 2024-02-03 DIAGNOSIS — I420 Dilated cardiomyopathy: Secondary | ICD-10-CM | POA: Diagnosis not present

## 2024-02-03 LAB — GLUCOSE, CAPILLARY
Glucose-Capillary: 104 mg/dL — ABNORMAL HIGH (ref 70–99)
Glucose-Capillary: 171 mg/dL — ABNORMAL HIGH (ref 70–99)
Glucose-Capillary: 186 mg/dL — ABNORMAL HIGH (ref 70–99)
Glucose-Capillary: 186 mg/dL — ABNORMAL HIGH (ref 70–99)
Glucose-Capillary: 188 mg/dL — ABNORMAL HIGH (ref 70–99)
Glucose-Capillary: 193 mg/dL — ABNORMAL HIGH (ref 70–99)

## 2024-02-03 LAB — CBC
HCT: 32.9 % — ABNORMAL LOW (ref 36.0–46.0)
Hemoglobin: 10.4 g/dL — ABNORMAL LOW (ref 12.0–15.0)
MCH: 27.2 pg (ref 26.0–34.0)
MCHC: 31.6 g/dL (ref 30.0–36.0)
MCV: 86.1 fL (ref 80.0–100.0)
Platelets: 192 10*3/uL (ref 150–400)
RBC: 3.82 MIL/uL — ABNORMAL LOW (ref 3.87–5.11)
RDW: 16.6 % — ABNORMAL HIGH (ref 11.5–15.5)
WBC: 12.7 10*3/uL — ABNORMAL HIGH (ref 4.0–10.5)
nRBC: 0 % (ref 0.0–0.2)

## 2024-02-03 LAB — RENAL FUNCTION PANEL
Albumin: 2.9 g/dL — ABNORMAL LOW (ref 3.5–5.0)
Anion gap: 7 (ref 5–15)
BUN: 44 mg/dL — ABNORMAL HIGH (ref 8–23)
CO2: 30 mmol/L (ref 22–32)
Calcium: 8 mg/dL — ABNORMAL LOW (ref 8.9–10.3)
Chloride: 96 mmol/L — ABNORMAL LOW (ref 98–111)
Creatinine, Ser: 0.98 mg/dL (ref 0.44–1.00)
GFR, Estimated: 58 mL/min — ABNORMAL LOW (ref >60)
Glucose, Bld: 140 mg/dL — ABNORMAL HIGH (ref 70–99)
Phosphorus: 4.1 mg/dL (ref 2.5–4.6)
Potassium: 3.5 mmol/L (ref 3.5–5.1)
Sodium: 133 mmol/L — ABNORMAL LOW (ref 135–145)

## 2024-02-03 LAB — COOXEMETRY PANEL
Carboxyhemoglobin: 1.2 % (ref 0.5–1.5)
Methemoglobin: <0.7 % (ref 0.0–1.5)
O2 Saturation: 59.5 %
Total hemoglobin: 14.6 g/dL (ref 12.0–16.0)
Total oxygen content: 58.4 %

## 2024-02-03 LAB — LACTIC ACID, PLASMA: Lactic Acid, Venous: 1 mmol/L (ref 0.5–1.9)

## 2024-02-03 LAB — MAGNESIUM: Magnesium: 2.1 mg/dL (ref 1.7–2.4)

## 2024-02-03 MED ORDER — ACETAZOLAMIDE 250 MG PO TABS
500.0000 mg | ORAL_TABLET | Freq: Once | ORAL | Status: AC
  Administered 2024-02-03: 500 mg via ORAL
  Filled 2024-02-03: qty 2

## 2024-02-03 MED ORDER — PROSOURCE TF20 ENFIT COMPATIBL EN LIQD
60.0000 mL | Freq: Every day | ENTERAL | Status: DC
  Administered 2024-02-03 – 2024-02-06 (×3): 60 mL
  Filled 2024-02-03: qty 60

## 2024-02-03 MED ORDER — VITAL AF 1.2 CAL PO LIQD
1000.0000 mL | ORAL | Status: DC
  Administered 2024-02-03 – 2024-02-05 (×3): 1000 mL

## 2024-02-03 MED ORDER — FUROSEMIDE 10 MG/ML IJ SOLN
160.0000 mg | Freq: Once | INTRAVENOUS | Status: AC
  Administered 2024-02-03: 160 mg via INTRAVENOUS
  Filled 2024-02-03: qty 16

## 2024-02-03 MED ORDER — POTASSIUM CHLORIDE 20 MEQ PO PACK
40.0000 meq | PACK | ORAL | Status: AC
  Administered 2024-02-03 (×2): 40 meq
  Filled 2024-02-03 (×2): qty 2

## 2024-02-03 MED ORDER — METOLAZONE 5 MG PO TABS
5.0000 mg | ORAL_TABLET | Freq: Once | ORAL | Status: AC
  Administered 2024-02-03: 5 mg
  Filled 2024-02-03: qty 1

## 2024-02-03 MED ORDER — POTASSIUM CHLORIDE 20 MEQ PO PACK: 40.0000 meq | PACK | Freq: Once | ORAL | Status: DC

## 2024-02-03 MED ORDER — INSULIN ASPART 100 UNIT/ML IJ SOLN
4.0000 [IU] | INTRAMUSCULAR | Status: DC
  Administered 2024-02-03 – 2024-02-06 (×20): 4 [IU] via SUBCUTANEOUS
  Filled 2024-02-03 (×20): qty 1

## 2024-02-03 MED ORDER — MILRINONE LACTATE IN DEXTROSE 20-5 MG/100ML-% IV SOLN
0.2500 ug/kg/min | INTRAVENOUS | Status: DC
  Administered 2024-02-03 – 2024-02-07 (×8): 0.25 ug/kg/min via INTRAVENOUS
  Filled 2024-02-03 (×8): qty 100

## 2024-02-03 MED ORDER — JUVEN PO PACK
1.0000 | PACK | Freq: Two times a day (BID) | ORAL | Status: DC
  Administered 2024-02-03 – 2024-02-06 (×5): 1

## 2024-02-03 MED ORDER — FUROSEMIDE 10 MG/ML IJ SOLN
80.0000 mg | Freq: Once | INTRAMUSCULAR | Status: AC
  Administered 2024-02-03: 80 mg via INTRAVENOUS
  Filled 2024-02-03: qty 8

## 2024-02-03 NOTE — IPAL (Signed)
  Interdisciplinary Goals of Care Family Meeting   Date carried out: 02/03/2024  Location of the meeting: Bedside  Member's involved: Physician, Nurse Practitioner, and Family Member or next of kin    GOALS OF CARE DISCUSSION  The Clinical status was relayed to family in detail-Daughter In Law  Updated and notified of patients medical condition- Patient remains unresponsive and will not open eyes to command.   Patient with increased WOB and using accessory muscles to breathe Explained to family course of therapy and the modalities  Patient with Progressive multiorgan failure with a very high probablity of a very minimal chance of meaningful recovery despite all aggressive and optimal medical therapy.    PATIENT REMAINS FULL CODE Recommend DNR status Discussion of possible TRACH AND PEG tube, severe heart disease Asked for family gathering and discussion  DIL satisfied with Plan of action and management. All questions answered  Additional CC time 35 mins   Lacreasha Hinds Nestora Baptise, M.D.  [facility]  Medical Director [facility] Medical Director [facility] Cardio-Pulmonary Department

## 2024-02-03 NOTE — Progress Notes (Signed)
 PHARMACY CONSULT NOTE - ELECTROLYTES  Pharmacy Consult for Electrolyte Monitoring and Replacement   Recent Labs: Height: 5' 4.02" (162.6 cm) Weight: 98.5 kg (217 lb 2.5 oz) IBW/kg (Calculated) : 54.74 Estimated Creatinine Clearance: 51.3 mL/min (by C-G formula based on SCr of 0.98 mg/dL). Potassium (mmol/L)  Date Value  02/03/2024 3.5   Magnesium  (mg/dL)  Date Value  56/38/7564 2.1   Calcium (mg/dL)  Date Value  33/29/5188 8.0 (L)   Albumin (g/dL)  Date Value  41/66/0630 2.9 (L)   Phosphorus (mg/dL)  Date Value  16/09/930 4.1   Sodium (mmol/L)  Date Value  02/03/2024 133 (L)   Assessment  Tanya Frye is a 83 y.o. female presenting with septic shock 2/2 CAP. PMH significant for COPD, CHF,  hypertension, T2DM. Pharmacy has been consulted to monitor and replace electrolytes.  Diet: NPO Pertinent medications: Amiodarone , norepinephrine , furosemide , acetazolamide  Goal of Therapy:  Potassium 4.0 - 5.1 mmol/L Magnesium  2.0 - 2.4 mg/dL All Other Electrolytes WNL  Plan:  40 mEq KCl per tube x 2 per MD Re-check labs tomorrow AM  Thank you for allowing pharmacy to be a part of this patient's care.  Adalberto Acton 02/03/2024 7:07 AM

## 2024-02-03 NOTE — Plan of Care (Signed)
  Problem: Clinical Measurements: Goal: Ability to maintain clinical measurements within normal limits will improve Outcome: Progressing Goal: Diagnostic test results will improve Outcome: Progressing Goal: Respiratory complications will improve Outcome: Progressing Goal: Cardiovascular complication will be avoided Outcome: Progressing   Problem: Activity: Goal: Risk for activity intolerance will decrease Outcome: Progressing   Problem: Nutrition: Goal: Adequate nutrition will be maintained Outcome: Progressing   Problem: Elimination: Goal: Will not experience complications related to bowel motility Outcome: Progressing Goal: Will not experience complications related to urinary retention Outcome: Progressing   Problem: Safety: Goal: Ability to remain free from injury will improve Outcome: Progressing   Problem: Skin Integrity: Goal: Risk for impaired skin integrity will decrease Outcome: Not Progressing

## 2024-02-03 NOTE — Plan of Care (Signed)
  Problem: Education: Goal: Knowledge of General Education information will improve Description: Including pain rating scale, medication(s)/side effects and non-pharmacologic comfort measures Outcome: Progressing   Problem: Clinical Measurements: Goal: Ability to maintain clinical measurements within normal limits will improve Outcome: Progressing Goal: Diagnostic test results will improve Outcome: Progressing Goal: Cardiovascular complication will be avoided Outcome: Progressing   Problem: Nutrition: Goal: Adequate nutrition will be maintained Outcome: Progressing   Problem: Safety: Goal: Ability to remain free from injury will improve Outcome: Progressing

## 2024-02-03 NOTE — Progress Notes (Signed)
 Palliative Care Progress Note, Assessment & Plan   Patient Name: Tanya Frye       Date: 02/03/2024 DOB: Oct 17, 1940  Age: 83 y.o. MRN#: [id number] Attending Physician: Cleve Dale, MD Primary Care Physician: Pcp, No Admit Date: 01/28/2024  Subjective: Patient is lying in bed with ventilatory support in place.  She is intermittently awake.  She has mitts in place.  Her daughter-in-law [name] is at bedside.  HPI: 83 y.o. female  with past medical history of COPD, CHF, HTN, type 2 diabetes, and HOH admitted on 01/28/2024 with acute respiratory distress requiring BiPAP.   Upon arrival to ED, patient found to be in A-fib with RVR and was given amiodarone .  Additionally, patient developed septic and cardiogenic shock requiring pressor support.  PCCM was consulted and later that evening patient required intubation.   Patient remains critically ill with multiorgan failure supported by 1 vasopressor. Leukocytosis has improved from 18.7-> 11.7 in the past 24 hours. LVEF on echo 25-30% with global hypokinesis.   PMT was consulted to support patient and family with goals of care discussions.  Summary of counseling/coordination of care: Extensive chart review completed prior to meeting patient including labs, vital signs, imaging, progress notes, orders, and available advanced directive documents from current and previous encounters.   After reviewing the patient's chart and assessing the patient at bedside, I spoke with patient's DIL Jodie in regards to symptom management and goals of care.   [name] shares she was hopeful that patient could be extubated today but that she is retaining fluid.  She shares understanding that patient will not be extubated today will be monitored daily to assess for readiness to  extubate.  Space and opportunity provided for [name] to share thoughts and emotions regarding her mother-in-law's current medical situation.  [name] shares that she is spoken with CCM earlier today in regards to placement of trach and PEG.  She shares she would like to speak with the family about this potential.  Discussed best and worst case scenarios.  Again reiterated that patient's goals, values, and wishes are at the forefront and center of medical decision making.  [name] shares that patient never made her wishes known but she remains hopeful that after extubation patient can share her thoughts/wishes.  No change to plan of care at this time.  [name]'s plans to speak with family in regards to potential for trach and PEG placement.  PMT will continue to follow and support patient and family throughout her hospitalization.  Physical Exam Vitals reviewed.  Constitutional:      General: She is not in acute distress.    Appearance: She is obese.  HENT:     Mouth/Throat:     Mouth: Mucous membranes are moist.  Eyes:     Pupils: Pupils are equal, round, and reactive to light.  Cardiovascular:     Rate and Rhythm: Normal rate.  Pulmonary:     Comments: Ventilatory support Abdominal:     Palpations: Abdomen is soft.  Skin:    General: Skin is warm and dry.     Coloration: Skin is pale.             Total Time 35 minutes  Time spent includes: Detailed review of medical records (labs, imaging, vital signs), medically appropriate exam (mental status, respiratory, cardiac, skin), discussed with treatment team, counseling and educating patient, family and staff, documenting clinical information, medication management and coordination of care.  Judeen Nose L. Rebbeca Campi, DNP, FNP-BC Palliative Medicine Team

## 2024-02-03 NOTE — Progress Notes (Signed)
 Advanced Heart Failure Rounding Note  Cardiologist: Belva Boyden, MD  Chief Complaint:  Subjective:    Patient in the middle of an SBT, mildly tachypneic with borderline tidal volumes but has been tolerating for over an hour.  Chest x-ray worsening today, hypertensive.   Objective:   Weight Range: 98.5 kg Body mass index is 37.26 kg/m.   Vital Signs:   Temp:  [97.1 F (36.2 C)-98.2 F (36.8 C)] 97.6 F (36.4 C) (05/13 2020) Pulse Rate:  [33-131] 80 (05/13 2020) Resp:  [16-28] 25 (05/13 2020) SpO2:  [72 %-100 %] 98 % (05/13 2020) Arterial Line BP: (69-163)/(39-81) 121/58 (05/13 2020) FiO2 (%):  [30 %] 30 % (05/13 1922) Weight:  [98.5 kg] 98.5 kg (05/13 0500) Last BM Date : 02/01/24  Weight change: Filed Weights   02/01/24 0439 02/02/24 0500 02/03/24 0500  Weight: 93.1 kg 97.6 kg 98.5 kg    Intake/Output:   Intake/Output Summary (Last 24 hours) at 02/03/2024 2044 Last data filed at 02/03/2024 1857 Gross per 24 hour  Intake 2569.65 ml  Output 1235 ml  Net 1334.65 ml      Physical Exam    GENERAL: Chronically ill-appearing PULM: Ventilated breath sounds CARDIAC:  JVP: mildly elevated, CVP 12         Irregular rate and rhythm, no murmurs, trace lower extremity edema  ABDOMEN: Soft, non-tender, non-distended. NEUROLOGIC: Nods head in answer to commands ]  Labs    CBC Recent Labs    02/02/24 0423 02/03/24 0441  WBC 11.0* 12.7*  HGB 9.4* 10.4*  HCT 30.1* 32.9*  MCV 85.5 86.1  PLT 175 192   Basic Metabolic Panel Recent Labs    16/10/96 0423 02/02/24 2039 02/03/24 0441  NA 134*  --  133*  K 3.5 3.6 3.5  CL 97*  --  96*  CO2 28  --  30  GLUCOSE 150*  --  140*  BUN 44*  --  44*  CREATININE 1.08*  --  0.98  CALCIUM 7.5*  --  8.0*  MG 2.2  --  2.1  PHOS 3.3  --  4.1   Liver Function Tests Recent Labs    02/02/24 0423 02/03/24 0441  ALBUMIN 2.6* 2.9*   No results for input(s): "LIPASE", "AMYLASE" in the last 72 hours. Cardiac  Enzymes No results for input(s): "CKTOTAL", "CKMB", "CKMBINDEX", "TROPONINI" in the last 72 hours.  BNP: BNP (last 3 results) Recent Labs    01/28/24 0914  BNP 251.9*    ProBNP (last 3 results) No results for input(s): "PROBNP" in the last 8760 hours.   D-Dimer No results for input(s): "DDIMER" in the last 72 hours. Hemoglobin A1C No results for input(s): "HGBA1C" in the last 72 hours. Fasting Lipid Panel No results for input(s): "CHOL", "HDL", "LDLCALC", "TRIG", "CHOLHDL", "LDLDIRECT" in the last 72 hours. Thyroid Function Tests No results for input(s): "TSH", "T4TOTAL", "T3FREE", "THYROIDAB" in the last 72 hours.  Invalid input(s): "FREET3"  Other results:    Medications:     Scheduled Medications:  aspirin   81 mg Per Tube Daily   Chlorhexidine  Gluconate Cloth  6 each Topical Daily   enoxaparin (LOVENOX) injection  1 mg/kg Subcutaneous BID   feeding supplement (PROSource TF20)  60 mL Per Tube Daily   free water   30 mL Per Tube Q4H   insulin  aspart  0-20 Units Subcutaneous Q4H   insulin  aspart  4 Units Subcutaneous Q4H   insulin  glargine-yfgn  10 Units Subcutaneous BID  levothyroxine  75 mcg Per Tube Q0600   metolazone  5 mg Oral Once   nutrition supplement (JUVEN)  1 packet Per Tube BID BM   mouth rinse  15 mL Mouth Rinse Q2H   pantoprazole  (PROTONIX ) IV  40 mg Intravenous Daily   QUEtiapine  25 mg Per Tube BID   sodium chloride  flush  10-40 mL Intracatheter Q12H    Infusions:  amiodarone  30 mg/hr (02/03/24 1857)   dexmedetomidine  (PRECEDEX ) IV infusion 1 mcg/kg/hr (02/03/24 2041)   dextrose      feeding supplement (VITAL AF 1.2 CAL) 50 mL/hr at 02/03/24 1857   fentaNYL  infusion INTRAVENOUS Stopped (02/02/24 1610)   furosemide      midazolam  0.5 mg/hr (02/02/24 2348)   milrinone 0.25 mcg/kg/min (02/03/24 2043)   norepinephrine  (LEVOPHED ) Adult infusion 1 mcg/min (02/03/24 1857)    PRN Medications: dextrose , dextrose , docusate, fentaNYL , midazolam ,  mouth rinse, polyethylene glycol, sodium chloride  flush    Patient Profile   83 y.o. female with history of obesity, DM II, HTN, HOH, hypothyroidism, gastric cancer in remission. Admitted with profound mixed septic/cardiogenic shock, acute respiratory failure and Afib with RVR   Assessment/Plan   Shock -Suspect mixed septic (possible PNA) and cardiogenic shock -Lactic acid > 9, now cleared -Treated with empiric abx for possible PNA -Briefly on NE overnight, transition to milrinone from dobutamine  given increased afterload and suboptimal urine response - Suspect takotsubo given appearance on echocardiogram but eventually will need to consider ischemic evaluation   Acute HFrEF - New, though potentially had a history of HF per family at bedside - Echo 05/25: EF 25-30%, preserved basal segements, RV dysfunction, and poor apical windows - Volume up today. CVP 12 but CXR worsening. Will increase lasix  to 160mg  IV BID and give 5mg  metolazone - GDMT once off inotropes/pressors - Can eventually consider R/LHC  and attempt at DCCV once she recovers   Afib with RVR - Newly diagnosed. Suspect a consequence of ongoing shock and new HF - Rate reasonably controlled on amiodarone  gtt at 30/hr - Anticoagulated with Lovenox, eventually switch to DOAC - Consider TEE/DCCV prior to discharge - TSH okay. On synthroid at home   Acute respiratory failure - In setting of suspected CAP and acute HFrEF - Vent management per CCM - Increase diuresis today to aid with extubation   DM II - A1c 6.7% - Per primary    Medication concerns reviewed with patient and pharmacy team. Barriers identified: Anticoagulation, inotropes, high dose diuretics  CRITICAL CARE Performed by: Lauralee Poll   Total critical care time: 45 minutes  Critical care time was exclusive of separately billable procedures and treating other patients.  Critical care was necessary to treat or prevent imminent or  life-threatening deterioration.  Critical care was time spent personally by me on the following activities: development of treatment plan with patient and/or surrogate as well as nursing, discussions with consultants, evaluation of patient's response to treatment, examination of patient, obtaining history from patient or surrogate, ordering and performing treatments and interventions, ordering and review of laboratory studies, ordering and review of radiographic studies, pulse oximetry and re-evaluation of patient's condition.   Length of Stay: 6  Lauralee Poll, MD  02/03/2024, 8:44 PM  Advanced Heart Failure Team Pager [phone] (M-F; 7a - 5p)  Please contact [facility] for night-coverage after hours (5p -7a ) and weekends on [email or web address]

## 2024-02-03 NOTE — Progress Notes (Signed)
 NAME:  Tanya Frye, MRN:  [id number], DOB:  02-24-1941, LOS: 6 ADMISSION DATE:  01/28/2024, CONSULTATION DATE:  01/28/2024 REFERRING MD:  Dr. Demetrios Finders, CHIEF COMPLAINT:  Shortness of Breath/Acute Respiratory Distress   Brief Pt Description / Synopsis:  83 y.o. female admitted with Acute Hypoxic Respiratory Failure and Severe Sepsis with Septic Shock due to Community Acquired Pneumonia, along with Acute decompensated HFrEF and Cardiogenic shock, and A. Fib w/ RVR.  Failed trial of BiPAP requiring intubation and mechanical ventilation.  History of Present Illness:  Tanya Frye is a 83 year old female with a past medical history significant for COPD, CHF,  hypertension, diabetes mellitus, and hard of hearing who presents to [facility] ED on 01/28/2024 due to shortness of breath.  Patient is currently unable to contribute to history due to acute respiratory distress, BiPAP, and being extremely hard of hearing, therefore history is obtained from chart review.  Per report she acutely developed respiratory distress earlier this morning.  Upon EMS arrival she was noted to be hypoxic with O2 saturations of 90% on room air which they placed her on CPAP en route to the hospital.  Given her respiratory distress upon arrival, she was transitioned to BiPAP.  ED Course: Initial Vital Signs: Temperature 96.9 F axillary, pulse 128, respiratory rate 33, blood pressure 146/119, SpO2 99% on BiPAP Significant Labs: Sodium 134, glucose 256, BUN 11, creatinine 1.1, BNP 251, lactic acid 4.8, high-sensitivity troponin 8, WBC 22.8 with neutrophilia VBG: pH 7.24/pCO2 51/pO2 58/bicarb 21.9 COVID-19/flu/RSV PCR is negative Imaging Chest X-ray>>IMPRESSION: 1. Mild cardiomegaly and vascular congestion. 2. Small bilateral pleural effusions and bibasilar atelectasis or infiltrate. Medications Administered: 2.5 L of LR boluses, IV azithromycin  and ceftriaxone , 0.5 mg IV Ativan , 4 mg morphine , 5 mg IV Cardizem   Following fluid  resuscitation in the ED, systolic blood pressure decreasing to the 70s and 80s requiring initiation of peripheral Levophed .  PCCM asked to admit for further workup and treatment.  Please see "Significant Hospital Events" section below for full detailed hospital course.  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   CHF (congestive heart failure) (HCC)    Diabetes mellitus without complication (HCC)    HOH (hard of hearing)    extremely HOH   Hypertension    Palpitations    occasional related to meds/heart races    Micro Data:  5/7: COVID/flu/RSV PCR>>negative 5/7: RVP>>negative 5/7: Blood culture x 2>>no growth  5/7: Tracheal aspirate>>unable to collect 5/7: MRSA PCR>>negative 5/7: Strep pneumo & Legionella urinary antigens>>negative  Antimicrobials:   Anti-infectives (From admission, onward)    Start     Dose/Rate Route Frequency Ordered Stop   02/01/24 1500  cefTRIAXone  (ROCEPHIN ) 2 g in sodium chloride  0.9 % 100 mL IVPB        2 g 200 mL/hr over 30 Minutes Intravenous  Once 02/01/24 1407 02/01/24 1534   01/29/24 1800  ceFEPIme  (MAXIPIME ) 2 g in sodium chloride  0.9 % 100 mL IVPB  Status:  Discontinued        2 g 200 mL/hr over 30 Minutes Intravenous Every 12 hours 01/29/24 1341 02/01/24 1407   01/29/24 1000  azithromycin  (ZITHROMAX ) 500 mg in sodium chloride  0.9 % 250 mL IVPB  Status:  Discontinued        500 mg 250 mL/hr over 60 Minutes Intravenous Every 24 hours 01/28/24 1343 01/31/24 1031   01/29/24 1000  cefTRIAXone  (ROCEPHIN ) 2 g  in sodium chloride  0.9 % 100 mL IVPB  Status:  Discontinued        2 g 200 mL/hr over 30 Minutes Intravenous Every 24 hours 01/28/24 1343 01/29/24 1341   01/28/24 1000  cefTRIAXone  (ROCEPHIN ) 2 g in sodium chloride  0.9 % 100 mL IVPB        2 g 200 mL/hr over 30 Minutes Intravenous Once 01/28/24 0954 01/28/24 1045   01/28/24 1000  azithromycin  (ZITHROMAX ) 500 mg in sodium  chloride 0.9 % 250 mL IVPB        500 mg 250 mL/hr over 60 Minutes Intravenous  Once 01/28/24 0954 01/28/24 1135       Significant Hospital Events: Including procedures, antibiotic start and stop dates in addition to other pertinent events   5/7: Presents to ED with Acute Respiratory Distress requiring BiPAP.  With A.fib w/ RVR requiring Amiodarone  bolus and drip, developing septic/cardiogenic shock requiring initiation of peripheral Levophed   PCCM asked to admit.  Required intubation later in the evening. 5/8: Remains critically ill with multiorgan failure,  on vent.  Requiring 3 vasopressors (Levo, vaso, Epi).  Cardiology and Palliative Care consulted.  Metabolic and lactic acidosis improving, stop Bicarb gtt.  Leukocytosis worsened, broaden Ceftriaxone  to Cefepime , cultures still pending.  Echo shows LVEF 25-30% with global hypokinesis . 5/9: Overnight unable to tolerate turning due to desaturations.   Remains critically ill on the vent, remains on 3 vasopressors along with Dobutamine .  Plan to obtain internal jugular/Subclavian access to be able to follow Coox, remove femoral lines. Renal function stable, leukocytosis improving.  Diurese with 60 mg IV Lasix  x1 dose, pleural effusions improved with diuresis therefore holding off on thoracentesis. 5/10: Pt remains mechanically intubated FiO2 50%/PEEP 8.  Remains levophed  and vasopressin .  During WUA pt able to follow commands, but failed SBT due to elevated hr and bp along with tachypnea with accessory muscle use  5/11: Pt remains mechanically intubated on minimal settings.  Pending WUA and SBT ~ failed SBT due to increased WOB and A.fib w/ RVR. 5/12: No significant events overnight.  Remains on Vasopressin .  On minimal vent support, plan for WUQ & SBT as tolerated.  Renal function remains stable, UOP 900 cc last 24 hrs (net + 13L), will diurese with 60 mg IV Lasix  x1 dose.  Coox is 67, remains on Dobutamine , Advanced CHF consulted. 5/13: No  significant events overnight, on minimal vent support.  Plan for WUA & SBT utilizing Precedex  ~ failed SBT due to tachypnea, increased WOB, and tachycardia.   Lactic normalized, Coox 59.5.  CHF team changing dobuatime to Milrinone.  Creatinine improved to 0.98, UOP 2.5 L last 24 hrs with diuresis (net + 12.7 L), continue with Lasix  80 BID, receiving additional Diamox x1 per CHF team.  Interim History / Subjective:  As outlined above under "Significant Hospital Events" section  Objective   Blood pressure (!) 82/48, pulse 82, temperature 97.8 F (36.6 C), temperature source Axillary, resp. rate 18, height 5' 4.02" (1.626 m), weight 98.5 kg, SpO2 100%. CVP:  [7 mmHg-70 mmHg] 28 mmHg  Vent Mode: PSV;CPAP FiO2 (%):  [30 %] 30 % Set Rate:  [20 bmp] 20 bmp Vt Set:  [450 mL] 450 mL PEEP:  [5 cmH20] 5 cmH20 Pressure Support:  [8 cmH20-15 cmH20] 10 cmH20 Plateau Pressure:  [19 cmH20] 19 cmH20   Intake/Output Summary (Last 24 hours) at 02/03/2024 0730 Last data filed at 02/03/2024 0616 Gross per 24 hour  Intake 2202.2 ml  Output 2535  ml  Net -332.8 ml   Filed Weights   02/01/24 0439 02/02/24 0500 02/03/24 0500  Weight: 93.1 kg 97.6 kg 98.5 kg    Examination: General: Acutely-ill appearing female, NAD mechanically intubated  HENT: Atraumatic, normocephalic, neck supple, no JVD, orally intubated Lungs: Faint rhonchi throughout, even, non labored, synchronous with the vent Cardiovascular: Irregular irregular, no m/r/g, 1+ radial/1+ distal pulses, no edema  Abdomen: +BS x4, obese, soft, non tender  Extremities: Chronic trophic changes to bilateral lower extremities Neuro: Lightly Sedated, wakes up to voice and follows simple commands, nodding to questions, no focal deficits noted, PERRL  GU: Foley catheter in place draining clear yellow urine   Resolved Hospital Problem list     Assessment & Plan:   #Acute Metabolic Encephalopathy #Sedation needs in setting of mechanical  ventilation PMHx: HOH -Treat metabolic derangements as outlined below -Maintain a RASS goal of 0 to -1 -Fentanyl  and Versed  as needed to maintain RASS goal -Avoid sedating medications as able -Daily wake up assessment ~ utilize Precedex  for WUA/SBT  #Shock: septic +/- cardiogenic #New onset atrial fibrillation with rvr #Acute decompensated HFrEF  #Elevated troponin in setting of demand ischemia vs NSTEMI PMHx: HTN, palpitations Echocardiogram 01/29/24: LVEF 25-30%, LV with global hypokinesis with severe hypokinesis of the basal to mid anterior/anteroseptal wall.  Diastolic parameters indeterminate. RV systolic function normal.  RV size normal.  Mild to moderate mitral regurgitation -Continuous cardiac monitoring -Maintain MAP >65 -Vasopressors as needed to maintain MAP goal -Advanced CHF following, appreciate input ~ follow Coox panel ~ plan to transition from Dobutamine  to Milrinone -Lactic acid has normalized -Troponin peaked at 1,032 -Diuresis as BP and renal function permits ~ continue Lasix  80 mg BID, to receive Diamox x1 dose on 5/13 -Cardiology following, appreciate input ~ Continue amiodarone  and heparin  gtts -TSH normal  #Acute hypoxic respiratory failure #Possible community acquired pneumonia ~ TREATED #Acute COPD exacerbation #Acute decompensated HFrEF #Bilateral pleural effusions  CTa Chest negative for PE, with medium to large bilateral pleural effusions with basilar infiltrates and atelectasis (edema vs superimposed pneumonia) -Full vent support, implement lung protective strategies -Plateau pressures less than 30 cm H20 -Wean FiO2 & PEEP as tolerated to maintain O2 sats 88 to 92% -Follow intermittent Chest X-ray & ABG as needed -Spontaneous Breathing Trials when respiratory parameters met and mental status permits -Implement VAP Bundle -Prn Bronchodilators -Completed course of ABX as above -Diuresis as BP and renal function permits ~ continue Lasix  80 mg BID, Diamox  x1 dose on 5/13  #Acute kidney injury ~ IMPROVING #Anion gap metabolic acidosis due to lactic acidosis ~ RESOLVED #Mild Hypokalemia -Monitor I&O's / urinary output -Follow BMP -Ensure adequate renal perfusion -Avoid nephrotoxic agents as able -Replace electrolytes as indicated ~ Pharmacy following for assistance with electrolyte replacement  #Met SIRS criteria (HR 130's, RR 30's, WBC 22.8) #Severe sepsis #Community acquired pneumonia ~ TREATED  CTa Abdomen & Pelvis with diffuse fatty liver changes - Trend WBC and monitor fever curve  - Follow cultures  - Completed abx as outlined above pending culture results and sensitivities   #Diabetes mellitus  Hgb A1c is 6.6 - CBG's q4h; Target range of 140 to 180 - SSI - Follow ICU hypo/hyperglycemia protocol   Pt is critically ill with multiorgan failure. Prognosis is guarded, high risk for further decompensation, cardiac arrest, and death.  Given current critical illness superimposed on multiple chronic co-morbidities and advanced age, overall long term prognosis is poor.  Recommend consideration of DNR/DNI status.  Palliative Care  following to assist with GOC discussions.   Best Practice (right click and "Reselect all SmartList Selections" daily)   Diet/type: NPO, tube feeds DVT prophylaxis: Heparin  gtt GI prophylaxis: PPI Lines: Right subclavian CVC and right radial arterial line still needed  Foley: yes, and is still needed Code Status:  full code Last date of multidisciplinary goals of care discussion [02/03/24]  5/13: Myself and Dr Auston Left updated pts daughter-in-law Jodie at bedside regarding pts condition and current plan of care.  All questions answered   Labs   CBC: Recent Labs  Lab 01/28/24 0914 01/29/24 0401 01/30/24 0159 01/31/24 0353 02/01/24 0400 02/02/24 0423 02/03/24 0441  WBC 22.8*   < > 23.4* 18.7* 11.7* 11.0* 12.7*  NEUTROABS 16.8*  --   --   --   --   --   --   HGB 13.1   < > 11.3* 10.4* 9.6* 9.4* 10.4*   HCT 42.7   < > 34.9* 32.5* 30.5* 30.1* 32.9*  MCV 87.5   < > 82.9 85.8 85.9 85.5 86.1  PLT 495*   < > 244 197 174 175 192   < > = values in this interval not displayed.    Basic Metabolic Panel: Recent Labs  Lab 01/31/24 0353 01/31/24 1222 02/01/24 0400 02/01/24 1625 02/02/24 0423 02/02/24 2039 02/03/24 0441  NA 133*  --  133* 135 134*  --  133*  K 3.3*   < > 3.8 3.7 3.5 3.6 3.5  CL 95*  --  97* 95* 97*  --  96*  CO2 27  --  26 29 28   --  30  GLUCOSE 228*  --  251* 152* 150*  --  140*  BUN 26*  --  36* 40* 44*  --  44*  CREATININE 1.20*  --  0.96 1.10* 1.08*  --  0.98  CALCIUM 7.5*  --  7.5* 7.7* 7.5*  --  8.0*  MG 2.1  --  2.3 2.3 2.2  --  2.1  PHOS 3.7  --  3.3 3.6 3.3  --  4.1   < > = values in this interval not displayed.   GFR: Estimated Creatinine Clearance: 51.3 mL/min (by C-G formula based on SCr of 0.98 mg/dL). Recent Labs  Lab 01/28/24 0914 01/28/24 1422 01/29/24 1140 01/29/24 2001 01/30/24 0159 01/31/24 0353 01/31/24 0901 02/01/24 0400 02/02/24 0423 02/03/24 0441  PROCALCITON <0.10  --   --   --   --   --   --   --   --   --   WBC 22.8*   < >  --   --    < > 18.7*  --  11.7* 11.0* 12.7*  LATICACIDVEN 4.8*   < > 3.8* 2.1*  --   --  1.5  --   --  1.0   < > = values in this interval not displayed.    Liver Function Tests: Recent Labs  Lab 01/28/24 0914 01/29/24 0401 01/30/24 0159 01/31/24 0353 02/01/24 0400 02/01/24 1625 02/02/24 0423 02/03/24 0441  AST 28 51*  --   --   --   --   --   --   ALT 20 38  --   --   --   --   --   --   ALKPHOS 29* 24*  --   --   --   --   --   --   BILITOT 0.6 0.9  --   --   --   --   --   --  PROT 7.6 6.1*  --   --   --   --   --   --   ALBUMIN 4.0 3.1*  3.1*   < > 2.8* 2.7* 2.7* 2.6* 2.9*   < > = values in this interval not displayed.   No results for input(s): "LIPASE", "AMYLASE" in the last 168 hours. No results for input(s): "AMMONIA" in the last 168 hours.  ABG    Component Value Date/Time   PHART  7.38 01/30/2024 0345   PCO2ART 35 01/30/2024 0345   PO2ART 70 (L) 01/30/2024 0345   HCO3 20.7 01/30/2024 0345   ACIDBASEDEF 3.8 (H) 01/30/2024 0345   O2SAT 59.5 02/03/2024 0442     Coagulation Profile: Recent Labs  Lab 01/28/24 0914  INR 1.0    Cardiac Enzymes: No results for input(s): "CKTOTAL", "CKMB", "CKMBINDEX", "TROPONINI" in the last 168 hours.  HbA1C: HbA1c POC (<> result, manual entry)  Date/Time Value Ref Range Status  04/03/2021 03:07 PM 7.1 4.0 - 5.6 % Final   Hgb A1c MFr Bld  Date/Time Value Ref Range Status  01/30/2024 01:59 AM 6.7 (H) 4.8 - 5.6 % Final    Comment:    (NOTE) Pre diabetes:          5.7%-6.4%  Diabetes:              >6.4%  Glycemic control for   <7.0% adults with diabetes   01/29/2024 04:01 AM 6.6 (H) 4.8 - 5.6 % Final    Comment:    (NOTE) Pre diabetes:          5.7%-6.4%  Diabetes:              >6.4%  Glycemic control for   <7.0% adults with diabetes     CBG: Recent Labs  Lab 02/02/24 1602 02/02/24 1946 02/02/24 2326 02/03/24 0326 02/03/24 0716  GLUCAP 151* 167* 150* 104* 171*    Review of Systems:   Unable to assess due to intubation/sedation/critical illness  Past Medical History:  She,  has a past medical history of Cancer (HCC), CHF (congestive heart failure) (HCC), Diabetes mellitus without complication (HCC), HOH (hard of hearing), Hypertension, and Palpitations.   Surgical History:   Past Surgical History:  Procedure Laterality Date   ABDOMINAL HYSTERECTOMY  1990   CATARACT EXTRACTION W/PHACO Right 12/20/2020   Procedure: CATARACT EXTRACTION PHACO AND INTRAOCULAR LENS PLACEMENT (IOC) RIGHT DIABETIC 8.13 01:17.6 10.5%;  Surgeon: Annell Kidney, MD;  Location[facility];  Service: Ophthalmology;  Laterality: Right;   CATARACT EXTRACTION W/PHACO Left 01/03/2021   Procedure: CATARACT EXTRACTION PHACO AND INTRAOCULAR LENS PLACEMENT (IOC) LEFT DIABETIC  7.37 01:15.6 9.8%;  Surgeon: Annell Kidney, MD;  Location: [facility];  Service: Ophthalmology;  Laterality: Left;   CHOLECYSTECTOMY     DILATION AND CURETTAGE OF UTERUS  before 1990   X2   NECK SURGERY  03/30/2008   Per family, pt broke her neck   STOMACH SURGERY     tumor removed, per family     Social History:   reports that she has never smoked. She has never used smokeless tobacco. She reports that she does not drink alcohol and does not use drugs.   Family History:  Her family history is not on file.   Allergies Allergies  Allergen Reactions   Penicillins Rash     Home Medications  Prior to Admission medications   Medication Sig Start Date End Date Taking? Authorizing Provider  aspirin  EC 81 MG  tablet Take 1 tablet by mouth daily.    [provider]  Calcium Carbonate-Vit D-Min (CALCIUM 1200 PO) Take by mouth.    [provider]  furosemide  (LASIX ) 40 MG tablet TAKE 1 TABLET BY MOUTH EVERY DAY 08/27/21   Theron Flavin, MD  glipiZIDE  (GLUCOTROL  XL) 5 MG 24 hr tablet Take 3 tablets (15 mg total) by mouth daily. 11/13/21   Masoud, Javed, MD  levothyroxine (SYNTHROID, LEVOTHROID) 88 MCG tablet Take 88 mcg by mouth every morning. 12/09/17   [provider]  loratadine  (CLARITIN ) 10 MG tablet TAKE 1 TABLET BY MOUTH EVERY DAY 09/24/22   Theron Flavin, MD  losartan  (COZAAR ) 50 MG tablet TAKE 1 TABLET BY MOUTH EVERY DAY 06/10/22   Masoud, Javed, MD  lovastatin (MEVACOR) 10 MG tablet TAKE 1 TABLET ONCE A DAY ORALLY 10/20/17   [provider]  metFORMIN  (GLUCOPHAGE ) 1000 MG tablet TAKE 1 TABLET BY MOUTH TWICE A DAY 12/20/20   Theron Flavin, MD  metoprolol  succinate (TOPROL -XL) 50 MG 24 hr tablet TAKE 1 TABLET BY MOUTH DAILY. TAKE WITH OR IMMEDIATELY FOLLOWING A MEAL. 08/23/22   Theron Flavin, MD  Omega-3 Fatty Acids (FISH OIL) 500 MG CAPS Take 1,000 mg by mouth daily.    [provider]  omeprazole  (PRILOSEC) 20 MG capsule TAKE 1 CAPSULE BY MOUTH EVERY DAY 06/04/21   Theron Flavin, MD     Critical care time: 40 minutes    Cherylann Corpus, AGACNP-BC [facility] Prefer epic messenger for cross cover needs If after hours, please call E-link

## 2024-02-03 NOTE — Progress Notes (Signed)
 Nutrition Follow Up Note   DOCUMENTATION CODES:   Obesity unspecified  INTERVENTION:   Change to Vital 1.2 @50ml /hr + ProSource TF 20- Give 60ml daily via tube  Free water  flushes 30ml q4 hours to maintain tube patency   Regimen provides 1440kcal/day, 110g/day protein and 1152ml/day of free water .   Juven Fruit Punch BID via tube, each serving provides 95kcal and 2.5g of protein (amino acids glutamine and arginine)  Daily weights   NUTRITION DIAGNOSIS:   Inadequate oral intake related to inability to eat (pt sedated and ventilated) as evidenced by NPO status. 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 Fib w/ RVR and AKI.  Pt remains sedated and ventilated. Pt is tolerating tube feeds at goal rate. Pt with new pressure injury noted; will add Juven. Refeed labs stable. Per chart, pt remains up ~20lbs from her UBW. Pt +12.7L on her I & Os. Pt is being diuresed. Chest x-ray from this morning reporting worsening bilateral pulmonary infiltrates and pleural effusions   Medications reviewed and include: diamox, aspirin , lovenox, lasix , insulin , synthroid, MVI, protonix , KCl, lovenox  Labs reviewed: Na 133(L), K 3.5 wnl, BUN 44(H), P 4.1 wnl, Mg 2.1 wnl Wbc- 12.7(H), Hgb 10.4(L), Hct 32.9(L) Cbgs- 171, 104, 150, 167, 151, 139, 160 x 24 hrs   Patient is currently intubated on ventilator support MV: 10.2 L/min Temp (24hrs), Avg:97.3 F (36.3 C), Min:97 F (36.1 C), Max:97.8 F (36.6 C)  MAP >30mmHg   UOP-   Diet Order:   Diet Order             Diet NPO time specified  Diet effective now                   EDUCATION NEEDS:   No education needs have been identified at this time  Skin:  Skin Assessment: Reviewed RN Assessment (Stage II sacrum and buttocks)  Last BM:  5/11- type 6  Height:   Ht Readings from Last 1 Encounters:  02/02/24 5' 4.02" (1.626 m)    Weight:   Wt Readings from Last 1 Encounters:  02/03/24 98.5 kg    Ideal Body Weight:  54.5 kg  BMI:  Body mass index is 37.26 kg/m.  Estimated Nutritional Needs:   Kcal:  1517kcal/day  Protein:  >109g/day  Fluid:  1.4-1.6L/day  Torrance Freestone MS, RD, LDN If unable to be reached, please send secure chat to "RD inpatient" available from 8:00a-4:00p daily

## 2024-02-04 ENCOUNTER — Other Ambulatory Visit: Payer: Self-pay

## 2024-02-04 ENCOUNTER — Inpatient Hospital Stay

## 2024-02-04 DIAGNOSIS — J9600 Acute respiratory failure, unspecified whether with hypoxia or hypercapnia: Secondary | ICD-10-CM | POA: Diagnosis not present

## 2024-02-04 DIAGNOSIS — A419 Sepsis, unspecified organism: Secondary | ICD-10-CM | POA: Diagnosis not present

## 2024-02-04 DIAGNOSIS — I420 Dilated cardiomyopathy: Secondary | ICD-10-CM | POA: Diagnosis not present

## 2024-02-04 DIAGNOSIS — L8990 Pressure ulcer of unspecified site, unspecified stage: Secondary | ICD-10-CM | POA: Insufficient documentation

## 2024-02-04 DIAGNOSIS — R570 Cardiogenic shock: Secondary | ICD-10-CM | POA: Diagnosis not present

## 2024-02-04 DIAGNOSIS — I4891 Unspecified atrial fibrillation: Secondary | ICD-10-CM | POA: Diagnosis not present

## 2024-02-04 DIAGNOSIS — J9601 Acute respiratory failure with hypoxia: Secondary | ICD-10-CM | POA: Diagnosis not present

## 2024-02-04 LAB — RENAL FUNCTION PANEL
Albumin: 3 g/dL — ABNORMAL LOW (ref 3.5–5.0)
Albumin: 3 g/dL — ABNORMAL LOW (ref 3.5–5.0)
Anion gap: 13 (ref 5–15)
Anion gap: 12 (ref 5–15)
BUN: 54 mg/dL — ABNORMAL HIGH (ref 8–23)
BUN: 60 mg/dL — ABNORMAL HIGH (ref 8–23)
CO2: 28 mmol/L (ref 22–32)
CO2: 29 mmol/L (ref 22–32)
Calcium: 8.6 mg/dL — ABNORMAL LOW (ref 8.9–10.3)
Calcium: 9.2 mg/dL (ref 8.9–10.3)
Chloride: 96 mmol/L — ABNORMAL LOW (ref 98–111)
Chloride: 95 mmol/L — ABNORMAL LOW (ref 98–111)
Creatinine, Ser: 1.04 mg/dL — ABNORMAL HIGH (ref 0.44–1.00)
Creatinine, Ser: 1.14 mg/dL — ABNORMAL HIGH (ref 0.44–1.00)
GFR, Estimated: 54 mL/min — ABNORMAL LOW (ref >60)
GFR, Estimated: 48 mL/min — ABNORMAL LOW (ref >60)
Glucose, Bld: 188 mg/dL — ABNORMAL HIGH (ref 70–99)
Glucose, Bld: 154 mg/dL — ABNORMAL HIGH (ref 70–99)
Phosphorus: 4.3 mg/dL (ref 2.5–4.6)
Phosphorus: 4.5 mg/dL (ref 2.5–4.6)
Potassium: 3.1 mmol/L — ABNORMAL LOW (ref 3.5–5.1)
Potassium: 3.8 mmol/L (ref 3.5–5.1)
Sodium: 137 mmol/L (ref 135–145)
Sodium: 136 mmol/L (ref 135–145)

## 2024-02-04 LAB — COOXEMETRY PANEL
Carboxyhemoglobin: 1.4 % (ref 0.5–1.5)
Methemoglobin: <0.7 % (ref 0.0–1.5)
O2 Saturation: 78.9 %
Total hemoglobin: 9.7 g/dL — ABNORMAL LOW (ref 12.0–16.0)
Total oxygen content: 77.6 %

## 2024-02-04 LAB — CBC
HCT: 35.1 % — ABNORMAL LOW (ref 36.0–46.0)
Hemoglobin: 11 g/dL — ABNORMAL LOW (ref 12.0–15.0)
MCH: 26.8 pg (ref 26.0–34.0)
MCHC: 31.3 g/dL (ref 30.0–36.0)
MCV: 85.4 fL (ref 80.0–100.0)
Platelets: 261 10*3/uL (ref 150–400)
RBC: 4.11 MIL/uL (ref 3.87–5.11)
RDW: 17.2 % — ABNORMAL HIGH (ref 11.5–15.5)
WBC: 16.2 10*3/uL — ABNORMAL HIGH (ref 4.0–10.5)
nRBC: 0 % (ref 0.0–0.2)

## 2024-02-04 LAB — MAGNESIUM
Magnesium: 2 mg/dL (ref 1.7–2.4)
Magnesium: 1.8 mg/dL (ref 1.7–2.4)

## 2024-02-04 LAB — GLUCOSE, CAPILLARY
Glucose-Capillary: 146 mg/dL — ABNORMAL HIGH (ref 70–99)
Glucose-Capillary: 179 mg/dL — ABNORMAL HIGH (ref 70–99)
Glucose-Capillary: 179 mg/dL — ABNORMAL HIGH (ref 70–99)
Glucose-Capillary: 179 mg/dL — ABNORMAL HIGH (ref 70–99)
Glucose-Capillary: 183 mg/dL — ABNORMAL HIGH (ref 70–99)

## 2024-02-04 LAB — LACTIC ACID, PLASMA: Lactic Acid, Venous: 1.3 mmol/L (ref 0.5–1.9)

## 2024-02-04 MED ORDER — POTASSIUM CHLORIDE 20 MEQ PO PACK
40.0000 meq | PACK | ORAL | Status: AC
  Administered 2024-02-04 (×2): 40 meq
  Filled 2024-02-04 (×2): qty 2

## 2024-02-04 MED ORDER — POTASSIUM CHLORIDE 10 MEQ/100ML IV SOLN
10.0000 meq | INTRAVENOUS | Status: AC
  Administered 2024-02-04 (×3): 10 meq via INTRAVENOUS
  Filled 2024-02-04 (×3): qty 100

## 2024-02-04 MED ORDER — SPIRONOLACTONE 25 MG PO TABS
25.0000 mg | ORAL_TABLET | Freq: Every day | ORAL | Status: DC
  Administered 2024-02-04 – 2024-02-07 (×4): 25 mg
  Filled 2024-02-04 (×4): qty 1

## 2024-02-04 MED ORDER — FUROSEMIDE 10 MG/ML IJ SOLN
160.0000 mg | Freq: Two times a day (BID) | INTRAVENOUS | Status: DC
  Administered 2024-02-04 – 2024-02-07 (×7): 160 mg via INTRAVENOUS
  Filled 2024-02-04 (×7): qty 16

## 2024-02-04 MED ORDER — PROPOFOL 1000 MG/100ML IV EMUL
5.0000 ug/kg/min | INTRAVENOUS | Status: DC
  Administered 2024-02-04: 45 ug/kg/min via INTRAVENOUS
  Administered 2024-02-04: 5 ug/kg/min via INTRAVENOUS
  Administered 2024-02-04: 65 ug/kg/min via INTRAVENOUS
  Administered 2024-02-05: 45 ug/kg/min via INTRAVENOUS
  Administered 2024-02-05 (×4): 30 ug/kg/min via INTRAVENOUS
  Administered 2024-02-06: 10 ug/kg/min via INTRAVENOUS
  Filled 2024-02-04 (×9): qty 100

## 2024-02-04 MED ORDER — POTASSIUM CHLORIDE 20 MEQ PO PACK
40.0000 meq | PACK | ORAL | Status: AC
  Administered 2024-02-04: 40 meq
  Filled 2024-02-04: qty 2

## 2024-02-04 MED ORDER — MIDAZOLAM HCL 2 MG/2ML IJ SOLN
1.0000 mg | INTRAMUSCULAR | Status: DC | PRN
  Administered 2024-02-04: 2 mg via INTRAVENOUS

## 2024-02-04 MED ORDER — MIDAZOLAM HCL 2 MG/2ML IJ SOLN
INTRAMUSCULAR | Status: AC
Start: 2024-02-04 — End: 2024-02-04
  Filled 2024-02-04: qty 2

## 2024-02-04 MED ORDER — METOLAZONE 5 MG PO TABS
5.0000 mg | ORAL_TABLET | Freq: Once | ORAL | Status: AC
  Administered 2024-02-04: 5 mg
  Filled 2024-02-04: qty 1

## 2024-02-04 MED ORDER — SPIRONOLACTONE 25 MG PO TABS: 25.0000 mg | ORAL_TABLET | Freq: Every day | ORAL | Status: DC

## 2024-02-04 MED ORDER — POLYETHYLENE GLYCOL 3350 17 G PO PACK: 17.0000 g | PACK | Freq: Every day | ORAL | Status: DC | PRN

## 2024-02-04 MED ORDER — MAGNESIUM SULFATE 2 GM/50ML IV SOLN
2.0000 g | Freq: Once | INTRAVENOUS | Status: AC
  Administered 2024-02-04: 2 g via INTRAVENOUS
  Filled 2024-02-04: qty 50

## 2024-02-04 MED ORDER — POTASSIUM CHLORIDE 20 MEQ PO PACK
40.0000 meq | PACK | Freq: Once | ORAL | Status: AC
  Administered 2024-02-04: 40 meq
  Filled 2024-02-04: qty 2

## 2024-02-04 MED ORDER — FENTANYL CITRATE PF 50 MCG/ML IJ SOSY
25.0000 ug | PREFILLED_SYRINGE | INTRAMUSCULAR | Status: DC | PRN
  Administered 2024-02-04 (×2): 50 ug via INTRAVENOUS
  Filled 2024-02-04 (×2): qty 1

## 2024-02-04 MED ORDER — ACETAZOLAMIDE 250 MG PO TABS
500.0000 mg | ORAL_TABLET | Freq: Two times a day (BID) | ORAL | Status: AC
  Administered 2024-02-04 (×2): 500 mg via ORAL
  Filled 2024-02-04 (×2): qty 2

## 2024-02-04 NOTE — IPAL (Signed)
  Interdisciplinary Goals of Care Family Meeting   Date carried out: 02/04/2024  Location of the meeting: Bedside  Member's involved: Physician, Nurse Practitioner, Bedside Registered Nurse, and Family Member or next of kin   GOALS OF CARE DISCUSSION  The Clinical status was relayed to family in detail- Daughter in law at bedside   Updated and notified of patients medical condition- Patient with increased WOB and using accessory muscles to breathe Explained to family course of therapy and the modalities  Patient with Progressive multiorgan failure with a very high probablity of a very minimal chance of meaningful recovery despite all aggressive and optimal medical therapy.   PATIENT REMAINS FULL CODE  END STAGE HEART FAILURE FAILURE TO WEAN FROM VENT VERY POOR PROGNOSIS  RECOMMEND DNR STATUS   FAMILY HAS NOT MADE ANY DECISIONS OR GOALS OF CARE. THIS IS CAUSING DELAY IN PATIENTS CARE I HAVE RELAYED TO DAUGHTER IN LAW MULTIPLE TIMES THAT PATIENT IS IN DISTRESS AND CAN NOT HAVE ORAL TUBE IN MOUTH/AIRWAY FOR LONG TIME.   WE WILL ATTEMPT EXTUBATION ONCE WE CAN CLARIFY RE-INTUBATION/TRACH AND FEEDING TUBES OR ONE WAY EXTUBATION  Family does NOT  understand the grave  situation. THEY ARE UPSET THAT I AM WANTING TO CLARIFY GOALS FOR PATIENT IN A TIMELY MANNER.  I HAVE RELAYED THAT PATIENT HAS VERY LOW CHANCE OF LIVING ON HER OWN.     Additional CC time 45 mins   Alonza Knisley Nestora Baptise, M.D.  [facility]  Medical Director [facility] Medical Director [facility] Cardio-Pulmonary Department

## 2024-02-04 NOTE — Progress Notes (Signed)
 Patient on no sedation and following simple commands. Respiratory therapist placed patient in spontaneous mode for spontaneous breathing trail.

## 2024-02-04 NOTE — Plan of Care (Signed)
  Problem: Clinical Measurements: Goal: Diagnostic test results will improve Outcome: Not Progressing Goal: Respiratory complications will improve Outcome: Not Progressing Goal: Cardiovascular complication will be avoided Outcome: Not Progressing

## 2024-02-04 NOTE — Progress Notes (Signed)
 NAME:  Tanya Frye, MRN:  [id number], DOB:  1941/03/14, LOS: 7 ADMISSION DATE:  01/28/2024, CONSULTATION DATE:  01/28/2024 REFERRING MD:  Dr. Demetrios Finders, CHIEF COMPLAINT:  Shortness of Breath/Acute Respiratory Distress   Brief Pt Description / Synopsis:  83 y.o. female admitted with Acute Hypoxic Respiratory Failure and Severe Sepsis with Septic Shock due to Community Acquired Pneumonia, along with Acute decompensated HFrEF and Cardiogenic shock, and A. Fib w/ RVR.  Failed trial of BiPAP requiring intubation and mechanical ventilation.  History of Present Illness:  Tanya suits is a 83 year old female with a past medical history significant for COPD, CHF,  hypertension, diabetes mellitus, and hard of hearing who presents to [facility] ED on 01/28/2024 due to shortness of breath.  Patient is currently unable to contribute to history due to acute respiratory distress, BiPAP, and being extremely hard of hearing, therefore history is obtained from chart review.  Per report she acutely developed respiratory distress earlier this morning.  Upon EMS arrival she was noted to be hypoxic with O2 saturations of 90% on room air which they placed her on CPAP en route to the hospital.  Given her respiratory distress upon arrival, she was transitioned to BiPAP.  ED Course: Initial Vital Signs: Temperature 96.9 F axillary, pulse 128, respiratory rate 33, blood pressure 146/119, SpO2 99% on BiPAP Significant Labs: Sodium 134, glucose 256, BUN 11, creatinine 1.1, BNP 251, lactic acid 4.8, high-sensitivity troponin 8, WBC 22.8 with neutrophilia VBG: pH 7.24/pCO2 51/pO2 58/bicarb 21.9 COVID-19/flu/RSV PCR is negative Imaging Chest X-ray>>IMPRESSION: 1. Mild cardiomegaly and vascular congestion. 2. Small bilateral pleural effusions and bibasilar atelectasis or infiltrate. Medications Administered: 2.5 L of LR boluses, IV azithromycin  and ceftriaxone , 0.5 mg IV Ativan , 4 mg morphine , 5 mg IV Cardizem   Following fluid  resuscitation in the ED, systolic blood pressure decreasing to the 70s and 80s requiring initiation of peripheral Levophed .  PCCM asked to admit for further workup and treatment.  Please see "Significant Hospital Events" section below for full detailed hospital course.  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   CHF (congestive heart failure) (HCC)    Diabetes mellitus without complication (HCC)    HOH (hard of hearing)    extremely HOH   Hypertension    Palpitations    occasional related to meds/heart races    Micro Data:  5/7: COVID/flu/RSV PCR>>negative 5/7: RVP>>negative 5/7: Blood culture x 2>>no growth to date 5/7: Tracheal aspirate>>unable to collect 5/7: MRSA PCR>>negative 5/7: Strep pneumo>>negative  Antimicrobials:   Anti-infectives (From admission, onward)    Start     Dose/Rate Route Frequency Ordered Stop   02/01/24 1500  cefTRIAXone  (ROCEPHIN ) 2 g in sodium chloride  0.9 % 100 mL IVPB        2 g 200 mL/hr over 30 Minutes Intravenous  Once 02/01/24 1407 02/01/24 1534   01/29/24 1800  ceFEPIme  (MAXIPIME ) 2 g in sodium chloride  0.9 % 100 mL IVPB  Status:  Discontinued        2 g 200 mL/hr over 30 Minutes Intravenous Every 12 hours 01/29/24 1341 02/01/24 1407   01/29/24 1000  azithromycin  (ZITHROMAX ) 500 mg in sodium chloride  0.9 % 250 mL IVPB  Status:  Discontinued        500 mg 250 mL/hr over 60 Minutes Intravenous Every 24 hours 01/28/24 1343 01/31/24 1031   01/29/24 1000  cefTRIAXone  (ROCEPHIN ) 2 g in sodium chloride   0.9 % 100 mL IVPB  Status:  Discontinued        2 g 200 mL/hr over 30 Minutes Intravenous Every 24 hours 01/28/24 1343 01/29/24 1341   01/28/24 1000  cefTRIAXone  (ROCEPHIN ) 2 g in sodium chloride  0.9 % 100 mL IVPB        2 g 200 mL/hr over 30 Minutes Intravenous Once 01/28/24 0954 01/28/24 1045   01/28/24 1000  azithromycin  (ZITHROMAX ) 500 mg in sodium chloride  0.9 % 250 mL  IVPB        500 mg 250 mL/hr over 60 Minutes Intravenous  Once 01/28/24 0954 01/28/24 1135       Significant Hospital Events: Including procedures, antibiotic start and stop dates in addition to other pertinent events   5/7: Presents to ED with Acute Respiratory Distress requiring BiPAP.  With A.fib w/ RVR requiring Amiodarone  bolus and drip, developing septic/cardiogenic shock requiring initiation of peripheral Levophed   PCCM asked to admit.  Required intubation later in the evening. 5/8: Remains critically ill with multiorgan failure,  on vent.  Requiring 3 vasopressors (Levo, vaso, Epi).  Cardiology and Palliative Care consulted.  Metabolic and lactic acidosis improving, stop Bicarb gtt.  Leukocytosis worsened, broaden Ceftriaxone  to Cefepime , cultures still pending.  Echo shows LVEF 25-30% with global hypokinesis . 5/9: Overnight unable to tolerate turning due to desaturations.   Remains critically ill on the vent, remains on 3 vasopressors along with Dobutamine .  Plan to obtain internal jugular/Subclavian access to be able to follow Coox, remove femoral lines. Renal function stable, leukocytosis improving.  Diurese with 60 mg IV Lasix  x1 dose, pleural effusions improved with diuresis therefore holding off on thoracentesis. 5/10: Pt remains mechanically intubated FiO2 50%/PEEP 8.  Remains levophed  and vasopressin .  During WUA pt able to follow commands, but failed SBT due to elevated hr and bp along with tachypnea with accessory muscle use  5/11: Pt remains mechanically intubated on minimal settings.  Failed SBT due to elevated hr and bp along with tachypnea with accessory muscle use  5/12: No significant events overnight.  Remains on Vasopressin .  On minimal vent support, plan for WUQ & SBT as tolerated.  Renal function remains stable, UOP 900 cc last 24 hrs (net + 13L), will diurese with 60 mg IV Lasix  x1 dose.  Coox is 67, remains on Dobutamine , Advanced CHF consulted. 5/13: No significant  events overnight, on minimal vent support.  Plan for WUA & SBT utilizing Precedex  ~ failed SBT due to tachypnea, increased WOB, and tachycardia.   Lactic normalized, Coox 59.5.  CHF team changing dobuatime to Milrinone.  Creatinine improved to 0.98, UOP 2.5 L last 24 hrs with diuresis (net + 12.7 L), continue with Lasix  80 BID, receiving additional Diamox x1 per CHF team. 5/14: Pt remains mechanically intubated on minimal vent settings.  Remains on        milrinone gtt per heart failure team recs coox improving.  Pending SBT   Interim History / Subjective:  As outlined above under "Significant Hospital Events" section  Objective   Blood pressure (!) 82/48, pulse (!) 118, temperature (!) 97.5 F (36.4 C), temperature source Axillary, resp. rate 19, height 5' 4.02" (1.626 m), weight 92.2 kg, SpO2 98%. CVP:  [4 mmHg-33 mmHg] 11 mmHg  Vent Mode: PRVC FiO2 (%):  [30 %] 30 % Set Rate:  [20 bmp] 20 bmp Vt Set:  [450 mL] 450 mL PEEP:  [5 cmH20] 5 cmH20 Pressure Support:  [8 cmH20] 8 cmH20   Intake/Output  Summary (Last 24 hours) at 02/04/2024 0732 Last data filed at 02/04/2024 0602 Gross per 24 hour  Intake 2837.57 ml  Output 4950 ml  Net -2112.43 ml   Filed Weights   02/02/24 0500 02/03/24 0500 02/04/24 0410  Weight: 97.6 kg 98.5 kg 92.2 kg    Examination: General: Acutely-ill appearing female, NAD mechanically intubated  HENT: Atraumatic, normocephalic, neck supple, no JVD, orally intubated Lungs: Diminished throughout, even, non labored, synchronous with the vent Cardiovascular: Irregular irregular, no m/r/g, 1+ radial/1+ distal pulses, no edema  Abdomen: +BS x4, obese, soft, non tender  Extremities: Chronic trophic changes to bilateral lower extremities Neuro: Awake and following commands, PERRL  GU: Foley catheter in place draining yellow urine   Resolved Hospital Problem list     Assessment & Plan:   #Acute Metabolic Encephalopathy~improving  #Sedation needs in setting of  mechanical ventilation Hx: HOH - Treat metabolic derangements  - Maintain a RASS goal of 0 to -1 - PAD protocol to maintain RASS goal: prn fentanyl  and versed   - Avoid sedating medications as able - Continue scheduled seroquel  - Daily wake up assessment  #Shock: septic +/- cardiogenic #New onset atrial fibrillation with rvr #Acute decompensated HFrEF  #Elevated troponin in setting of demand ischemia vs NSTEMI PMHx: HTN, palpitations Echocardiogram 01/29/24: LVEF 25-30%, LV with global hypokinesis with severe hypokinesis of the basal to mid anterior/anteroseptal wall.  Diastolic parameters indeterminate. RV systolic function normal.  RV size normal.  Mild to moderate mitral regurgitation - Continuous telemetry monitoring - Prn levophed  and vasopressin  gtts to maintain map 65 or higher  - Troponin peaked at 1,032 - Follow Coox panel - Heart failure team consulted appreciate input: milrinone @0 .25 mcg/kg/min per recs  - Diuresis as BP and renal function permits - Continue amiodarone  gtt for rate control  - TSH normal - Cardiology following, appreciate input  #Acute hypoxic respiratory failure #Possible community acquired pneumonia #Acute COPD exacerbation #Acute decompensated HFrEF #Bilateral pleural effusions  CTa Chest negative for PE, with medium to large bilateral pleural effusions with basilar infiltrates and atelectasis (edema vs superimposed pneumonia) - Full vent support, implement lung protective strategies - Plateau pressures less than 30 cm H20 - Wean FiO2 & PEEP as tolerated to maintain O2 sats 88 to 92% - Follow intermittent CXR & ABG as needed - Spontaneous Breathing Trials when respiratory parameters met and mental status permits - Implement VAP Bundle - Prn bronchodilators  #Acute kidney injury #Anion gap metabolic acidosis due to lactic acidosis - Trend BMP  - Replace electrolytes as indicated  - Strict I&O's  - Avoid nephrotoxic agents as able   #Met SIRS  criteria (HR 130's, RR 30's, WBC 22.8) #Severe sepsis~resolved  #Community acquired pneumonia~treated  CTa Abdomen & Pelvis with diffuse fatty liver changes - Trend WBC and monitor fever curve  - Completed course of abx   #Diabetes mellitus  Hgb A1c is 6.6 - CBG's q4h; Target range of 140 to 180 - SSI - Follow ICU hypo/hyperglycemia protocol  Pt is critically ill with multiorgan failure. Prognosis is guarded, high risk for further decompensation, cardiac arrest, and death.  Given current critical illness superimposed on multiple chronic co-morbidities and advanced age, overall long term prognosis is poor.  Recommend consideration of DNR/DNI status.  Palliative Care following to assist with GOC discussions.  Best Practice (right click and "Reselect all SmartList Selections" daily)   Diet/type: NPO, tube feeds DVT prophylaxis: subcutaneous lovenox  GI prophylaxis: PPI Lines: Right subclavian CVC still needed  will consider discontinuing arterial line today  Foley: yes, and is still needed Code Status:  full code Last date of multidisciplinary goals of care discussion [02/02/24]  5/12: Will update pts daughter-in-law regarding pts condition and current plan of care when she arrives at bedside   Labs   CBC: Recent Labs  Lab 01/28/24 0914 01/29/24 0401 01/31/24 0353 02/01/24 0400 02/02/24 0423 02/03/24 0441 02/04/24 0356  WBC 22.8*   < > 18.7* 11.7* 11.0* 12.7* 16.2*  NEUTROABS 16.8*  --   --   --   --   --   --   HGB 13.1   < > 10.4* 9.6* 9.4* 10.4* 11.0*  HCT 42.7   < > 32.5* 30.5* 30.1* 32.9* 35.1*  MCV 87.5   < > 85.8 85.9 85.5 86.1 85.4  PLT 495*   < > 197 174 175 192 261   < > = values in this interval not displayed.    Basic Metabolic Panel: Recent Labs  Lab 02/01/24 0400 02/01/24 1625 02/02/24 0423 02/02/24 2039 02/03/24 0441 02/04/24 0356  NA 133* 135 134*  --  133* 137  K 3.8 3.7 3.5 3.6 3.5 3.1*  CL 97* 95* 97*  --  96* 96*  CO2 26 29 28   --  30 28   GLUCOSE 251* 152* 150*  --  140* 188*  BUN 36* 40* 44*  --  44* 54*  CREATININE 0.96 1.10* 1.08*  --  0.98 1.04*  CALCIUM 7.5* 7.7* 7.5*  --  8.0* 8.6*  MG 2.3 2.3 2.2  --  2.1 2.0  PHOS 3.3 3.6 3.3  --  4.1 4.3   GFR: Estimated Creatinine Clearance: 46.7 mL/min (A) (by C-G formula based on SCr of 1.04 mg/dL (H)). Recent Labs  Lab 01/28/24 0914 01/28/24 1422 01/29/24 2001 01/30/24 0159 01/31/24 0901 02/01/24 0400 02/02/24 0423 02/03/24 0441 02/04/24 0356  PROCALCITON <0.10  --   --   --   --   --   --   --   --   WBC 22.8*   < >  --    < >  --  11.7* 11.0* 12.7* 16.2*  LATICACIDVEN 4.8*   < > 2.1*  --  1.5  --   --  1.0 1.3   < > = values in this interval not displayed.    Liver Function Tests: Recent Labs  Lab 01/28/24 0914 01/29/24 0401 01/30/24 0159 02/01/24 0400 02/01/24 1625 02/02/24 0423 02/03/24 0441 02/04/24 0356  AST 28 51*  --   --   --   --   --   --   ALT 20 38  --   --   --   --   --   --   ALKPHOS 29* 24*  --   --   --   --   --   --   BILITOT 0.6 0.9  --   --   --   --   --   --   PROT 7.6 6.1*  --   --   --   --   --   --   ALBUMIN 4.0 3.1*  3.1*   < > 2.7* 2.7* 2.6* 2.9* 3.0*   < > = values in this interval not displayed.   No results for input(s): "LIPASE", "AMYLASE" in the last 168 hours. No results for input(s): "AMMONIA" in the last 168 hours.  ABG    Component Value Date/Time   PHART 7.38  01/30/2024 0345   PCO2ART 35 01/30/2024 0345   PO2ART 70 (L) 01/30/2024 0345   HCO3 20.7 01/30/2024 0345   ACIDBASEDEF 3.8 (H) 01/30/2024 0345   O2SAT 78.9 02/04/2024 0356     Coagulation Profile: Recent Labs  Lab 01/28/24 0914  INR 1.0    Cardiac Enzymes: No results for input(s): "CKTOTAL", "CKMB", "CKMBINDEX", "TROPONINI" in the last 168 hours.  HbA1C: HbA1c POC (<> result, manual entry)  Date/Time Value Ref Range Status  04/03/2021 03:07 PM 7.1 4.0 - 5.6 % Final   Hgb A1c MFr Bld  Date/Time Value Ref Range Status  01/30/2024  01:59 AM 6.7 (H) 4.8 - 5.6 % Final    Comment:    (NOTE) Pre diabetes:          5.7%-6.4%  Diabetes:              >6.4%  Glycemic control for   <7.0% adults with diabetes   01/29/2024 04:01 AM 6.6 (H) 4.8 - 5.6 % Final    Comment:    (NOTE) Pre diabetes:          5.7%-6.4%  Diabetes:              >6.4%  Glycemic control for   <7.0% adults with diabetes     CBG: Recent Labs  Lab 02/03/24 1136 02/03/24 1658 02/03/24 1959 02/03/24 2355 02/04/24 0720  GLUCAP 188* 186* 193* 186* 183*    Review of Systems:   Unable to assess due to intubation/sedation/critical illness  Past Medical History:  She,  has a past medical history of Cancer (HCC), CHF (congestive heart failure) (HCC), Diabetes mellitus without complication (HCC), HOH (hard of hearing), Hypertension, and Palpitations.   Surgical History:   Past Surgical History:  Procedure Laterality Date   ABDOMINAL HYSTERECTOMY  1990   CATARACT EXTRACTION W/PHACO Right 12/20/2020   Procedure: CATARACT EXTRACTION PHACO AND INTRAOCULAR LENS PLACEMENT (IOC) RIGHT DIABETIC 8.13 01:17.6 10.5%;  Surgeon: Annell Kidney, MD;  Location: [facility];  Service: Ophthalmology;  Laterality: Right;   CATARACT EXTRACTION W/PHACO Left 01/03/2021   Procedure: CATARACT EXTRACTION PHACO AND INTRAOCULAR LENS PLACEMENT (IOC) LEFT DIABETIC  7.37 01:15.6 9.8%;  Surgeon: Annell Kidney, MD;  Location: [facility];  Service: Ophthalmology;  Laterality: Left;   CHOLECYSTECTOMY     DILATION AND CURETTAGE OF UTERUS  before 1990   X2   NECK SURGERY  03/30/2008   Per family, pt broke her neck   STOMACH SURGERY     tumor removed, per family     Social History:   reports that she has never smoked. She has never used smokeless tobacco. She reports that she does not drink alcohol and does not use drugs.   Family History:  Her family history is not on file.   Allergies Allergies  Allergen Reactions   Penicillins Rash      Home Medications  Prior to Admission medications   Medication Sig Start Date End Date Taking? Authorizing Provider  aspirin  EC 81 MG tablet Take 1 tablet by mouth daily.    [provider]  Calcium Carbonate-Vit D-Min (CALCIUM 1200 PO) Take by mouth.    [provider]  furosemide  (LASIX ) 40 MG tablet TAKE 1 TABLET BY MOUTH EVERY DAY 08/27/21   Theron Flavin, MD  glipiZIDE  (GLUCOTROL  XL) 5 MG 24 hr tablet Take 3 tablets (15 mg total) by mouth daily. 11/13/21   Masoud, Javed, MD  levothyroxine (SYNTHROID, LEVOTHROID) 88 MCG tablet Take 88 mcg  by mouth every morning. 12/09/17   [provider]  loratadine  (CLARITIN ) 10 MG tablet TAKE 1 TABLET BY MOUTH EVERY DAY 09/24/22   Theron Flavin, MD  losartan  (COZAAR ) 50 MG tablet TAKE 1 TABLET BY MOUTH EVERY DAY 06/10/22   Masoud, Javed, MD  lovastatin (MEVACOR) 10 MG tablet TAKE 1 TABLET ONCE A DAY ORALLY 10/20/17   [provider]  metFORMIN  (GLUCOPHAGE ) 1000 MG tablet TAKE 1 TABLET BY MOUTH TWICE A DAY 12/20/20   Theron Flavin, MD  metoprolol  succinate (TOPROL -XL) 50 MG 24 hr tablet TAKE 1 TABLET BY MOUTH DAILY. TAKE WITH OR IMMEDIATELY FOLLOWING A MEAL. 08/23/22   Theron Flavin, MD  Omega-3 Fatty Acids (FISH OIL) 500 MG CAPS Take 1,000 mg by mouth daily.    [provider]  omeprazole  (PRILOSEC) 20 MG capsule TAKE 1 CAPSULE BY MOUTH EVERY DAY 06/04/21   Theron Flavin, MD     Critical care time: 38 minutes    Janey Meek, AGNP  Pulmonary/Critical Care Pager [phone] (please enter 7 digits) PCCM Consult Pager [phone] (please enter 7 digits)

## 2024-02-04 NOTE — Consult Note (Signed)
 WOC Nurse Consult Note: Reason for Consult:Skin breakdown noted on RN assessment today.  Moisture associated skin damage to buttocks and sacral area  Patient is noted to be in disposable briefs and is incontinent Wound type:moisture associated skin damage to sacrum and buttocks.  Pressure Injury POA: NA Measurement: 6 cm x 10 cm x 0.1 cm  Wound QMV:HQIONGE thickness tissue loss  Drainage (amount, consistency, odor) minimal serosanguinous  no odor  Periwound: intact  frequently moist due to exposure to incontinence.  Was in a disposable brief.  Recommend no disposable briefs or underpads.  Dressing procedure/placement/frequency: Cleanse perineal area and buttocks with soap and water  twice daily and pat dry. Apply barrier cream twice daily and PRN soilage.  Will not follow at this time.  Please re-consult if needed.  Branda Cain MSN, RN, FNP-BC CWON Wound, Ostomy, Continence Nurse Outpatient [facility] [phone] Pager [phone]

## 2024-02-04 NOTE — Progress Notes (Signed)
 While giving the patient a bath this evening it was discovered she has fairly large fissure type openings between her labia majora and minora. They are partially open with blood clots that have formed over them. There is still slight active bleeding occurring. Discussed with Recardo Canal Rust-Chester NP and agreed to heavily apply the unit supplied moisture barrier cream/ointment to the area and pass on this protective practice to the next shift and will continue to evaluate/assess.

## 2024-02-04 NOTE — Progress Notes (Addendum)
 Advanced Heart Failure Rounding Note  Cardiologist: Belva Boyden, MD  Chief Complaint:  Subjective:    Tolerating SBT much better this morning, excellent response to metolazone augmentation yesterday. Hypertensive while on SBT, but borderline BP while on sedation.   Would expect that she should continue to improve her work of breathing with continued diuresis. She is early in her intubated course, is still clearly overloaded but improving.   Objective:   Weight Range: 92.2 kg Body mass index is 34.87 kg/m.   Vital Signs:   Temp:  [97.5 F (36.4 C)-99.6 F (37.6 C)] 99.6 F (37.6 C) (05/14 1200) Pulse Rate:  [34-132] 122 (05/14 1200) Resp:  [16-26] 19 (05/14 1200) SpO2:  [72 %-100 %] 99 % (05/14 1200) Arterial Line BP: (99-168)/(43-78) 160/74 (05/14 1200) FiO2 (%):  [30 %] 30 % (05/14 0757) Weight:  [92.2 kg] 92.2 kg (05/14 0410) Last BM Date : 02/03/24  Weight change: Filed Weights   02/02/24 0500 02/03/24 0500 02/04/24 0410  Weight: 97.6 kg 98.5 kg 92.2 kg    Intake/Output:   Intake/Output Summary (Last 24 hours) at 02/04/2024 1303 Last data filed at 02/04/2024 1200 Gross per 24 hour  Intake 4577.05 ml  Output 5600 ml  Net -1022.95 ml      Physical Exam    GENERAL: Chronically ill-appearing PULM: Ventilated breath sounds, comfortable on SBT CARDIAC:  JVP: mildly elevated, CVP 12         Irregular rate and rhythm, no murmurs, trace lower extremity edema  ABDOMEN: Soft, non-tender, non-distended. NEUROLOGIC: Nods head in answer to commands  Labs    CBC Recent Labs    02/03/24 0441 02/04/24 0356  WBC 12.7* 16.2*  HGB 10.4* 11.0*  HCT 32.9* 35.1*  MCV 86.1 85.4  PLT 192 261   Basic Metabolic Panel Recent Labs    16/10/96 0441 02/04/24 0356  NA 133* 137  K 3.5 3.1*  CL 96* 96*  CO2 30 28  GLUCOSE 140* 188*  BUN 44* 54*  CREATININE 0.98 1.04*  CALCIUM 8.0* 8.6*  MG 2.1 2.0  PHOS 4.1 4.3   Liver Function Tests Recent Labs     02/03/24 0441 02/04/24 0356  ALBUMIN 2.9* 3.0*   No results for input(s): "LIPASE", "AMYLASE" in the last 72 hours. Cardiac Enzymes No results for input(s): "CKTOTAL", "CKMB", "CKMBINDEX", "TROPONINI" in the last 72 hours.  BNP: BNP (last 3 results) Recent Labs    01/28/24 0914  BNP 251.9*    ProBNP (last 3 results) No results for input(s): "PROBNP" in the last 8760 hours.   D-Dimer No results for input(s): "DDIMER" in the last 72 hours. Hemoglobin A1C No results for input(s): "HGBA1C" in the last 72 hours. Fasting Lipid Panel No results for input(s): "CHOL", "HDL", "LDLCALC", "TRIG", "CHOLHDL", "LDLDIRECT" in the last 72 hours. Thyroid Function Tests No results for input(s): "TSH", "T4TOTAL", "T3FREE", "THYROIDAB" in the last 72 hours.  Invalid input(s): "FREET3"  Other results:    Medications:     Scheduled Medications:  acetaZOLAMIDE  500 mg Oral BID   aspirin   81 mg Per Tube Daily   Chlorhexidine  Gluconate Cloth  6 each Topical Daily   enoxaparin (LOVENOX) injection  1 mg/kg Subcutaneous BID   feeding supplement (PROSource TF20)  60 mL Per Tube Daily   free water   30 mL Per Tube Q4H   insulin  aspart  0-20 Units Subcutaneous Q4H   insulin  aspart  4 Units Subcutaneous Q4H   insulin  glargine-yfgn  10  Units Subcutaneous BID   levothyroxine  75 mcg Per Tube Q0600   nutrition supplement (JUVEN)  1 packet Per Tube BID BM   mouth rinse  15 mL Mouth Rinse Q2H   pantoprazole  (PROTONIX ) IV  40 mg Intravenous Daily   QUEtiapine  25 mg Per Tube BID   sodium chloride  flush  10-40 mL Intracatheter Q12H   spironolactone  25 mg Per Tube Daily    Infusions:  amiodarone  30 mg/hr (02/04/24 1200)   dextrose      feeding supplement (VITAL AF 1.2 CAL) 50 mL/hr at 02/04/24 1200   furosemide  66 mL/hr at 02/04/24 1200   milrinone 0.25 mcg/kg/min (02/04/24 1200)   propofol (DIPRIVAN) infusion      PRN Medications: dextrose , dextrose , docusate, fentaNYL  (SUBLIMAZE )  injection, midazolam , mouth rinse, polyethylene glycol, sodium chloride  flush    Patient Profile   83 y.o. female with history of obesity, DM II, HTN, HOH, hypothyroidism, gastric cancer in remission. Admitted with profound mixed septic/cardiogenic shock, acute respiratory failure and Afib with RVR   Assessment/Plan   Shock -Suspect mixed septic (possible PNA) and cardiogenic shock -Lactic acid > 9, now cleared -Treated with empiric abx for possible PNA, now off. WBC worsening today, no fevers but would watch closely - Suspect takotsubo given appearance on echocardiogram but eventually will need to consider ischemic evaluation pending recovery   Acute HFrEF - New, though potentially had a history of HF per family at bedside - Echo 05/25: EF 25-30%, preserved basal segements, RV dysfunction, and poor apical windows - Volume and CXR improved, continued IV lasix  160mg  BID with acetazolamide, hold on metolazone while potassium repleted - GDMT once off inotropes/pressors - Can eventually consider R/LHC  and attempt at TEE/DCCV once she recovers   Afib with RVR - Newly diagnosed. Suspect a consequence of ongoing shock and new HF - Rate reasonably controlled on amiodarone  gtt at 30/hr - Anticoagulated with Lovenox, eventually switch to DOAC - Will need TEE/DCCV prior to discharge - TSH okay. On synthroid at home   Acute respiratory failure - In setting of suspected CAP and acute HFrEF - Vent management per CCM - Continued diuresis   DM II - A1c 6.7% - Per primary    Medication concerns reviewed with patient and pharmacy team. Barriers identified: Anticoagulation, inotropes, high dose diuretics  CRITICAL CARE Performed by: Lauralee Poll   Total critical care time: 35 minutes  Critical care time was exclusive of separately billable procedures and treating other patients.  Critical care was necessary to treat or prevent imminent or life-threatening  deterioration.  Critical care was time spent personally by me on the following activities: development of treatment plan with patient and/or surrogate as well as nursing, discussions with consultants, evaluation of patient's response to treatment, examination of patient, obtaining history from patient or surrogate, ordering and performing treatments and interventions, ordering and review of laboratory studies, ordering and review of radiographic studies, pulse oximetry and re-evaluation of patient's condition.   Length of Stay: 7  Lauralee Poll, MD  02/04/2024, 1:03 PM  Advanced Heart Failure Team Pager [phone] (M-F; 7a - 5p)  Please contact [facility] for night-coverage after hours (5p -7a ) and weekends on [email or web address]

## 2024-02-04 NOTE — Progress Notes (Signed)
 Palliative Care Progress Note, Assessment & Plan   Patient Name: Tanya Frye       Date: 02/04/2024 DOB: Dec 23, 1940  Age: 83 y.o. MRN#: [id number] Attending Physician: Cleve Dale, MD Primary Care Physician: Pcp, No Admit Date: 01/28/2024  Subjective: Patient is lying in bed with mitts in place.  She remains intubated.  She is awake and alert but unable to follow simple commands like squeezing my hands or tracking me.  Her daughter-in-law Jodie and I met outside of the patient's room and spoke again on the phone this afternoon.  HPI: 83 y.o. female  with past medical history of COPD, CHF, HTN, type 2 diabetes, and HOH admitted on 01/28/2024 with acute respiratory distress requiring BiPAP.   Upon arrival to ED, patient found to be in A-fib with RVR and was given amiodarone .  Additionally, patient developed septic and cardiogenic shock requiring pressor support.  PCCM was consulted and later that evening patient required intubation.   Patient remains critically ill with multiorgan failure supported by 1 vasopressor. Leukocytosis has improved from 18.7-> 11.7 in the past 24 hours. LVEF on echo 25-30% with global hypokinesis.   PMT was consulted to support patient and family with goals of care discussions.  Summary of counseling/coordination of care: Extensive chart review completed prior to meeting patient including labs, vital signs, imaging, progress notes, orders, and available advanced directive documents from current and previous encounters.   After reviewing the patient's chart and assessing the patient at bedside, I spoke with Jodie patient in regards to symptom management and goals of care.   Attempted to gauge [name]'s understanding of patient's medical situation.  She says as per cardiology, patient is  doing well and can likely be removed from the ventilator on Saturday/this weekend.  She shares she knows the patient's chest x-ray looks better today than it did yesterday but that medications to pull fluid off (Lasix  (are being continued.  She reviewed that CCM discussed removing ventilator and placing a trach.  She shares she does not believe it is time for the patient to have a trach "just yet" and shares she feels "rushed".    Extensive discussion of cardiology and CCM's recommendations.  Reviewed that patient has failed her spontaneous breathing trials. Additionally, she has a weak heart (EF 25-30, RV dysfunction).  I highlighted CCM's concern and share that I am in agreement with their recommendation that patient likely would fail if extubated without a trach in place. Therefore, family needs to decide whether patient would want a trach.   I advised her that discussions of trach do not mean it will immediately be placed.  A surgical team will need to be consulted to evaluate the patient, they will need to discuss risk and benefit of placement of trach, and and then a time will be scheduled to place the trach.  All of this does not happen overnight and therefore must be coordinated with several teams.   Additionally, I highlighted that patient has shown signs that she will fail if extubated.  Therefore if family's wishes are for full scope of treatment, a trach needs to be placed to avoid further deterioration of airway or other complications given intubation.  [name] shares she is upset at the moment after hearing such differing news but is trying to take it all in. Space and opportunity provided for [name] to share thoughts and emotions.  Therapeutic silence, active listening, and emotional support provided.  She shares her daughter-in-law Carolyn Cisco plans to be at the hospital this evening.  [name] is requesting that the medical team give Jamie a full update as she has not been involved in patient's  care.  Additionally, [name] does not wish to have a family meeting this evening.  She is going to coordinate a family meeting with her sons for Friday in person.  After speaking with [name], I conveyed above to CCM NP Bernabe Brew and [employer].  PMT will continue to follow and support patient throughout her hospitalization.  Full code and full scope remain.  Physical Exam Vitals reviewed.  Constitutional:      General: She is not in acute distress. HENT:     Head: Normocephalic.     Nose: Nose normal.     Mouth/Throat:     Mouth: Mucous membranes are moist.  Eyes:     Pupils: Pupils are equal, round, and reactive to light.  Pulmonary:     Comments: Ventilatory support Abdominal:     Palpations: Abdomen is soft.  Skin:    General: Skin is warm and dry.     Coloration: Skin is pale.  Neurological:     Mental Status: She is alert.             Total Time 50 minutes   Time spent includes: Detailed review of medical records (labs, imaging, vital signs), medically appropriate exam (mental status, respiratory, cardiac, skin), discussed with treatment team, counseling and educating patient, family and staff, documenting clinical information, medication management and coordination of care.  Judeen Nose L. Rebbeca Campi, DNP, FNP-BC Palliative Medicine Team

## 2024-02-04 NOTE — Progress Notes (Signed)
 PHARMACY CONSULT NOTE - ELECTROLYTES  Pharmacy Consult for Electrolyte Monitoring and Replacement   Recent Labs: Height: 5' 4.02" (162.6 cm) Weight: 92.2 kg (203 lb 4.2 oz) IBW/kg (Calculated) : 54.74 Estimated Creatinine Clearance: 46.7 mL/min (A) (by C-G formula based on SCr of 1.04 mg/dL (H)). Potassium (mmol/L)  Date Value  02/04/2024 3.1 (L)   Magnesium  (mg/dL)  Date Value  69/62/9528 2.0   Calcium (mg/dL)  Date Value  41/32/4401 8.6 (L)   Albumin (g/dL)  Date Value  02/72/5366 3.0 (L)   Phosphorus (mg/dL)  Date Value  44/11/4740 4.3   Sodium (mmol/L)  Date Value  02/04/2024 137   Assessment  Tanya Frye is a 83 y.o. female presenting with septic shock 2/2 CAP. PMH significant for COPD, CHF,  hypertension, T2DM. Pharmacy has been consulted to monitor and replace electrolytes.  diuretics: furosemide  160 mg IV BID, acetazolamide 500 mg po BID, spironolactone 25 mg po daily  Goal of Therapy:  potassium 4.0 - 5.1 mmol/L magnesium  2.0 - 2.4 mg/dL All Other Electrolytes WNL  Plan:  10 mEq KCl IV x 3 40 mEq KCl per tube x 2  Re-check labs today at 1600 and again tomorrow AM  Thank you for allowing pharmacy to be a part of this patient's care.  Adalberto Acton 02/04/2024 7:08 AM

## 2024-02-04 NOTE — Progress Notes (Signed)
   02/04/24 1830  Spiritual Encounters  Type of Visit Follow up  Care provided to: Pt and family (Granddaughter in [place] at bedside)  Referral source Chaplain assessment  Reason for visit Routine spiritual support  OnCall Visit Yes  Spiritual Framework  Presenting Themes Meaning/purpose/sources of inspiration;Impactful experiences and emotions;Other (comment) (for Family)  Interventions  Spiritual Care Interventions Made Established relationship of care and support;Compassionate presence;Supported grief process;Other (comment) (for Family)  Intervention Outcomes  Outcomes Connection to spiritual care;Awareness around self/spiritual resourses;Awareness of support;Other (comment) (for Family)

## 2024-02-04 NOTE — Progress Notes (Signed)
 PHARMACY CONSULT NOTE - ELECTROLYTES  Pharmacy Consult for Electrolyte Monitoring and Replacement   Recent Labs: Height: 5' 4.02" (162.6 cm) Weight: 92.2 kg (203 lb 4.2 oz) IBW/kg (Calculated) : 54.74 Estimated Creatinine Clearance: 42.6 mL/min (A) (by C-G formula based on SCr of 1.14 mg/dL (H)). Potassium (mmol/L)  Date Value  02/04/2024 3.8   Magnesium  (mg/dL)  Date Value  16/06/9603 1.8   Calcium (mg/dL)  Date Value  54/05/8118 9.2   Albumin (g/dL)  Date Value  14/78/2956 3.0 (L)   Phosphorus (mg/dL)  Date Value  21/30/8657 4.5   Sodium (mmol/L)  Date Value  02/04/2024 136   Assessment  Tanya Frye is a 83 y.o. female presenting with septic shock 2/2 CAP. PMH significant for COPD, CHF,  hypertension, T2DM. Pharmacy has been consulted to monitor and replace electrolytes.  diuretics: furosemide  160 mg IV BID, acetazolamide 500 mg po BID, spironolactone 25 mg po daily  Goal of Therapy:  potassium 4.0 - 5.1 mmol/L magnesium  2.0 - 2.4 mg/dL All Other Electrolytes WNL  Plan:  K 3.8, Give 40 mEq per tube x 1 Mag 1.8, Give Mag sulfate 2 g IV x 1  Re-check labs tomorrow AM  Thank you for allowing pharmacy to be a part of this patient's care.  Alice Innocent, PharmD Clinical Pharmacist  02/04/2024 4:53 PM

## 2024-02-04 NOTE — Progress Notes (Signed)
 Heart rate elevated to 120-130's, SBP 160-180, and patient with noted increase work of breathing.   Janey Meek, NP and Dr. Auston Left spoke with patient daughter in law about extubation.  Patient to be placed back on full support and sedated if needed.  Possible family meeting with daughter in law and grandson's this afternoon to discuss goals in care. When patient asked basic questions such as year patient answered inappropriately therefore patient mentation is in question as to if she is completely alert and oriented to make her own decisions.

## 2024-02-04 NOTE — Plan of Care (Signed)
  Problem: Clinical Measurements: Goal: Ability to maintain clinical measurements within normal limits will improve Outcome: Progressing Goal: Will remain free from infection Outcome: Progressing Goal: Diagnostic test results will improve Outcome: Progressing Goal: Respiratory complications will improve Outcome: Progressing Goal: Cardiovascular complication will be avoided Outcome: Not Progressing   Problem: Activity: Goal: Risk for activity intolerance will decrease Outcome: Progressing   Problem: Nutrition: Goal: Adequate nutrition will be maintained Outcome: Progressing   Problem: Coping: Goal: Level of anxiety will decrease Outcome: Progressing   Problem: Elimination: Goal: Will not experience complications related to bowel motility Outcome: Progressing Goal: Will not experience complications related to urinary retention Outcome: Progressing   Problem: Pain Managment: Goal: General experience of comfort will improve and/or be controlled Outcome: Progressing   Problem: Safety: Goal: Ability to remain free from injury will improve Outcome: Progressing   Problem: Skin Integrity: Goal: Risk for impaired skin integrity will decrease Outcome: Progressing

## 2024-02-04 NOTE — Progress Notes (Signed)
 After two doses of fentanyl  and one dose of versed  patient continues to appear uncomfortable with heart rates 130's and SBP 150-160. Order obtained for propofol from Dana Nelson,NP

## 2024-02-04 NOTE — Progress Notes (Signed)
 0033 - Pt HR noted to start dropping down into the upper 50's. Weaning Precedex  as able.   0045 - Pt noted to have a short pause on telemetry monitor w/ HR noted to be 38 (though not sustained). Precedex  drip turned off. Recardo Canal, NP at bedside. Pt's heart rate up to upper 50's to low 60's. Advised to pause Amio x1 hour and to restart if HR remains above 60. Plan to keep Precedex  drip off and utilize PRN Fentanyl  IVP, if needed.   0200 - Amio drip restarted. Pt's heart rate improved to 60's-70's.   0400 - Abdominal distention noted and increasingly taunt. Provider notified. Verbal order for KUB received.

## 2024-02-05 DIAGNOSIS — I420 Dilated cardiomyopathy: Secondary | ICD-10-CM | POA: Diagnosis not present

## 2024-02-05 DIAGNOSIS — I4891 Unspecified atrial fibrillation: Secondary | ICD-10-CM | POA: Diagnosis not present

## 2024-02-05 DIAGNOSIS — J9601 Acute respiratory failure with hypoxia: Secondary | ICD-10-CM | POA: Diagnosis not present

## 2024-02-05 DIAGNOSIS — R570 Cardiogenic shock: Secondary | ICD-10-CM | POA: Diagnosis not present

## 2024-02-05 DIAGNOSIS — J9600 Acute respiratory failure, unspecified whether with hypoxia or hypercapnia: Secondary | ICD-10-CM | POA: Diagnosis not present

## 2024-02-05 DIAGNOSIS — A419 Sepsis, unspecified organism: Secondary | ICD-10-CM | POA: Diagnosis not present

## 2024-02-05 LAB — GLUCOSE, CAPILLARY
Glucose-Capillary: 169 mg/dL — ABNORMAL HIGH (ref 70–99)
Glucose-Capillary: 189 mg/dL — ABNORMAL HIGH (ref 70–99)
Glucose-Capillary: 180 mg/dL — ABNORMAL HIGH (ref 70–99)
Glucose-Capillary: 181 mg/dL — ABNORMAL HIGH (ref 70–99)
Glucose-Capillary: 172 mg/dL — ABNORMAL HIGH (ref 70–99)
Glucose-Capillary: 183 mg/dL — ABNORMAL HIGH (ref 70–99)
Glucose-Capillary: 199 mg/dL — ABNORMAL HIGH (ref 70–99)

## 2024-02-05 LAB — TRIGLYCERIDES: Triglycerides: 193 mg/dL — ABNORMAL HIGH (ref <150)

## 2024-02-05 LAB — MAGNESIUM: Magnesium: 2.2 mg/dL (ref 1.7–2.4)

## 2024-02-05 LAB — CBC
HCT: 34.5 % — ABNORMAL LOW (ref 36.0–46.0)
Hemoglobin: 10.7 g/dL — ABNORMAL LOW (ref 12.0–15.0)
MCH: 26.9 pg (ref 26.0–34.0)
MCHC: 31 g/dL (ref 30.0–36.0)
MCV: 86.7 fL (ref 80.0–100.0)
Platelets: 262 10*3/uL (ref 150–400)
RBC: 3.98 MIL/uL (ref 3.87–5.11)
RDW: 17 % — ABNORMAL HIGH (ref 11.5–15.5)
WBC: 14 10*3/uL — ABNORMAL HIGH (ref 4.0–10.5)
nRBC: 0 % (ref 0.0–0.2)

## 2024-02-05 LAB — RENAL FUNCTION PANEL
Albumin: 3.2 g/dL — ABNORMAL LOW (ref 3.5–5.0)
Anion gap: 11 (ref 5–15)
BUN: 69 mg/dL — ABNORMAL HIGH (ref 8–23)
CO2: 34 mmol/L — ABNORMAL HIGH (ref 22–32)
Calcium: 10.3 mg/dL (ref 8.9–10.3)
Chloride: 90 mmol/L — ABNORMAL LOW (ref 98–111)
Creatinine, Ser: 1.18 mg/dL — ABNORMAL HIGH (ref 0.44–1.00)
GFR, Estimated: 46 mL/min — ABNORMAL LOW (ref >60)
Glucose, Bld: 192 mg/dL — ABNORMAL HIGH (ref 70–99)
Phosphorus: 6.4 mg/dL — ABNORMAL HIGH (ref 2.5–4.6)
Potassium: 4.1 mmol/L (ref 3.5–5.1)
Sodium: 135 mmol/L (ref 135–145)

## 2024-02-05 LAB — COOXEMETRY PANEL
Carboxyhemoglobin: 1.2 % (ref 0.5–1.5)
Methemoglobin: 0.9 % (ref 0.0–1.5)
O2 Saturation: 63.7 %
Total hemoglobin: 11.2 g/dL — ABNORMAL LOW (ref 12.0–16.0)
Total oxygen content: 62.3 %

## 2024-02-05 MED ORDER — NOREPINEPHRINE 16 MG/250ML-% IV SOLN
0.0000 ug/min | INTRAVENOUS | Status: DC
  Administered 2024-02-05: 2 ug/min via INTRAVENOUS
  Filled 2024-02-05 (×2): qty 250

## 2024-02-05 MED ORDER — ACETAZOLAMIDE 250 MG PO TABS
500.0000 mg | ORAL_TABLET | Freq: Two times a day (BID) | ORAL | Status: AC
  Administered 2024-02-05 (×2): 500 mg via ORAL
  Filled 2024-02-05 (×2): qty 2

## 2024-02-05 MED ORDER — POTASSIUM CHLORIDE 20 MEQ PO PACK
40.0000 meq | PACK | Freq: Once | ORAL | Status: AC
  Administered 2024-02-05: 40 meq
  Filled 2024-02-05: qty 2

## 2024-02-05 NOTE — Plan of Care (Signed)
  Problem: Clinical Measurements: Goal: Ability to maintain clinical measurements within normal limits will improve Outcome: Progressing Goal: Will remain free from infection Outcome: Progressing   Problem: Nutrition: Goal: Adequate nutrition will be maintained Outcome: Progressing   Problem: Elimination: Goal: Will not experience complications related to bowel motility Outcome: Progressing Goal: Will not experience complications related to urinary retention Outcome: Progressing   Problem: Safety: Goal: Ability to remain free from injury will improve Outcome: Progressing   Problem: Skin Integrity: Goal: Risk for impaired skin integrity will decrease Outcome: Progressing   Problem: Clinical Measurements: Goal: Respiratory complications will improve Outcome: Not Progressing   Problem: Activity: Goal: Risk for activity intolerance will decrease Outcome: Not Progressing

## 2024-02-05 NOTE — Progress Notes (Addendum)
 PHARMACY CONSULT NOTE - ELECTROLYTES  Pharmacy Consult for Electrolyte Monitoring and Replacement   Recent Labs: Height: 5' 4.02" (162.6 cm) Weight: 87.2 kg (192 lb 3.9 oz) IBW/kg (Calculated) : 54.74 Estimated Creatinine Clearance: 40 mL/min (A) (by C-G formula based on SCr of 1.18 mg/dL (H)). Potassium (mmol/L)  Date Value  02/05/2024 4.1   Magnesium  (mg/dL)  Date Value  16/06/9603 2.2   Calcium (mg/dL)  Date Value  54/05/8118 10.3   Albumin (g/dL)  Date Value  14/78/2956 3.2 (L)   Phosphorus (mg/dL)  Date Value  21/30/8657 6.4 (H)   Sodium (mmol/L)  Date Value  02/05/2024 135   Assessment  Tanya Frye is a 83 y.o. female presenting with septic shock 2/2 CAP. PMH significant for COPD, CHF,  hypertension, T2DM. Pharmacy has been consulted to monitor and replace electrolytes.  diuretics: furosemide  160 mg IV BID, acetazolamide 500 mg po BID, spironolactone 25 mg po daily  Goal of Therapy:  potassium 4.0 - 5.1 mmol/L magnesium  2.0 - 2.4 mg/dL All Other Electrolytes WNL  Plan:  40 mEq KCl per tube x 1 Re-check labs again tomorrow AM  Thank you for allowing pharmacy to be a part of this patient's care.  Adalberto Acton 02/05/2024 7:33 AM

## 2024-02-05 NOTE — Progress Notes (Addendum)
 Advanced Heart Failure Rounding Note  Cardiologist: Belva Boyden, MD  Chief Complaint:  Subjective:    Excellent urine output yesterday, CVP 8-10, about to start SBT trial. More interactive today. Discussed plan of care with family at bedside. Following commands.    Objective:   Weight Range: 87.2 kg Body mass index is 32.98 kg/m.   Vital Signs:   Temp:  [97.7 F (36.5 C)-99.3 F (37.4 C)] 98.4 F (36.9 C) (05/15 1600) Pulse Rate:  [44-117] 94 (05/15 1400) Resp:  [14-21] 15 (05/15 1400) BP: (88-115)/(55-88) 106/56 (05/15 0830) SpO2:  [97 %-100 %] 99 % (05/15 1516) Arterial Line BP: (78-168)/(40-78) 129/58 (05/15 1400) FiO2 (%):  [30 %] 30 % (05/15 1516) Weight:  [87.2 kg] 87.2 kg (05/15 0600) Last BM Date : 02/03/24  Weight change: Filed Weights   02/03/24 0500 02/04/24 0410 02/05/24 0600  Weight: 98.5 kg 92.2 kg 87.2 kg    Intake/Output:   Intake/Output Summary (Last 24 hours) at 02/05/2024 1610 Last data filed at 02/05/2024 1600 Gross per 24 hour  Intake 2645.66 ml  Output 8100 ml  Net -5454.34 ml      Physical Exam    GENERAL: Chronically ill-appearing PULM: Ventilated breath sounds, comfortable on SBT CARDIAC:  JVP: mildly elevated, CVP 12         Irregular rate and rhythm, no murmurs, trace lower extremity edema  ABDOMEN: Soft, non-tender, non-distended. NEUROLOGIC: Nods head in answer to commands, moves all 4's follows commands    Medications:     Scheduled Medications:  acetaZOLAMIDE  500 mg Oral BID   aspirin   81 mg Per Tube Daily   Chlorhexidine  Gluconate Cloth  6 each Topical Daily   enoxaparin (LOVENOX) injection  1 mg/kg Subcutaneous BID   feeding supplement (PROSource TF20)  60 mL Per Tube Daily   free water   30 mL Per Tube Q4H   insulin  aspart  0-20 Units Subcutaneous Q4H   insulin  aspart  4 Units Subcutaneous Q4H   insulin  glargine-yfgn  10 Units Subcutaneous BID   levothyroxine  75 mcg Per Tube Q0600   nutrition supplement  (JUVEN)  1 packet Per Tube BID BM   mouth rinse  15 mL Mouth Rinse Q2H   pantoprazole  (PROTONIX ) IV  40 mg Intravenous Daily   QUEtiapine  25 mg Per Tube BID   sodium chloride  flush  10-40 mL Intracatheter Q12H   spironolactone  25 mg Per Tube Daily    Infusions:  amiodarone  30 mg/hr (02/05/24 1600)   dextrose      feeding supplement (VITAL AF 1.2 CAL) 50 mL/hr at 02/05/24 1600   furosemide  Stopped (02/05/24 0903)   milrinone 0.25 mcg/kg/min (02/05/24 1600)   norepinephrine  (LEVOPHED ) Adult infusion 3 mcg/min (02/05/24 1600)   propofol (DIPRIVAN) infusion 30 mcg/kg/min (02/05/24 1600)    PRN Medications: dextrose , dextrose , docusate, fentaNYL  (SUBLIMAZE ) injection, midazolam , mouth rinse, polyethylene glycol, sodium chloride  flush    Patient Profile   83 y.o. female with history of obesity, DM II, HTN, HOH, hypothyroidism, gastric cancer in remission. Admitted with profound mixed septic/cardiogenic shock, acute respiratory failure and Afib with RVR   Assessment/Plan   Shock -Suspect mixed septic (possible PNA) and cardiogenic shock -Lactic acid > 9, now cleared -Treated with empiric abx for possible PNA, now off. WBC mildly improved today - Suspect takotsubo given appearance on echocardiogram. Given debility will defer ischemic evaluation at this time. Could consider prior to discharge if significant improvement   Acute HFrEF - New, though  potentially had a history of HF per family at bedside - Echo 05/25: EF 25-30%, preserved basal segements, RV dysfunction, and poor apical windows - Volume and CXR improved, continued IV lasix  160mg  BID with acetazolamide, given 1 dose metolazone yesterday - Augmenting diuresis with IV milrinone 0.25mcg/kg/min, will continue for help with diuresis - GDMT once off inotropes/pressors, limited by sedation overnight - Consider TEE/DCCV prior to discharge pending improvement in functional status   Afib with RVR - Newly diagnosed. Suspect a  consequence of ongoing shock and new HF - Rate reasonably controlled on amiodarone  gtt at 30/hr - Anticoagulated with Lovenox, eventually switch to DOAC - Will need TEE/DCCV prior to discharge - TSH okay. On synthroid at home   Acute respiratory failure - In setting of suspected CAP and acute HFrEF - Vent management per CCM - Continued diuresis   DM II - A1c 6.7% - Per primary    Medication concerns reviewed with patient and pharmacy team. Barriers identified: Anticoagulation, inotropes, high dose diuretics  CRITICAL CARE Performed by: Lauralee Poll   Total critical care time: 32 minutes  Critical care time was exclusive of separately billable procedures and treating other patients.  Critical care was necessary to treat or prevent imminent or life-threatening deterioration.  Critical care was time spent personally by me on the following activities: development of treatment plan with patient and/or surrogate as well as nursing, discussions with consultants, evaluation of patient's response to treatment, examination of patient, obtaining history from patient or surrogate, ordering and performing treatments and interventions, ordering and review of laboratory studies, ordering and review of radiographic studies, pulse oximetry and re-evaluation of patient's condition.   Length of Stay: 8  Lauralee Poll, MD  02/05/2024, 4:10 PM  Advanced Heart Failure Team Pager [phone] (M-F; 7a - 5p)  Please contact [facility] for night-coverage after hours (5p -7a ) and weekends on [email or web address]

## 2024-02-05 NOTE — Progress Notes (Signed)
 NAME:  Tanya Frye, MRN:  [id number], DOB:  [birth year], LOS: 8 ADMISSION DATE:  01/28/2024, CONSULTATION DATE:  01/28/2024 REFERRING MD:  Dr. Demetrios Finders, CHIEF COMPLAINT:  Shortness of Breath/Acute Respiratory Distress   Brief Pt Description / Synopsis:  83 y.o. female admitted with Acute Hypoxic Respiratory Failure and Severe Sepsis with Septic Shock due to Community Acquired Pneumonia, along with Acute decompensated HFrEF and Cardiogenic shock, and A. Fib w/ RVR.  Failed trial of BiPAP requiring intubation and mechanical ventilation.  History of Present Illness:  Tanya Frye is a 83 year old female with a past medical history significant for COPD, CHF,  hypertension, diabetes mellitus, and hard of hearing who presents to [facility] ED on 01/28/2024 due to shortness of breath.  Patient is currently unable to contribute to history due to acute respiratory distress, BiPAP, and being extremely hard of hearing, therefore history is obtained from chart review.  Per report she acutely developed respiratory distress earlier this morning.  Upon EMS arrival she was noted to be hypoxic with O2 saturations of 90% on room air which they placed her on CPAP en route to the hospital.  Given her respiratory distress upon arrival, she was transitioned to BiPAP.  ED Course: Initial Vital Signs: Temperature 96.9 F axillary, pulse 128, respiratory rate 33, blood pressure 146/119, SpO2 99% on BiPAP Significant Labs: Sodium 134, glucose 256, BUN 11, creatinine 1.1, BNP 251, lactic acid 4.8, high-sensitivity troponin 8, WBC 22.8 with neutrophilia VBG: pH 7.24/pCO2 51/pO2 58/bicarb 21.9 COVID-19/flu/RSV PCR is negative Imaging Chest X-ray>>IMPRESSION: 1. Mild cardiomegaly and vascular congestion. 2. Small bilateral pleural effusions and bibasilar atelectasis or infiltrate. Medications Administered: 2.5 L of LR boluses, IV azithromycin  and ceftriaxone , 0.5 mg IV Ativan , 4 mg morphine , 5 mg IV Cardizem   Following fluid  resuscitation in the ED, systolic blood pressure decreasing to the 70s and 80s requiring initiation of peripheral Levophed .  PCCM asked to admit for further workup and treatment.  Please see "Significant Hospital Events" section below for full detailed hospital course.  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   CHF (congestive heart failure) (HCC)    Diabetes mellitus without complication (HCC)    HOH (hard of hearing)    extremely HOH   Hypertension    Palpitations    occasional related to meds/heart races    Micro Data:  5/7: COVID/flu/RSV PCR>>negative 5/7: RVP>>negative 5/7: Blood culture x 2>>no growth to date 5/7: Tracheal aspirate>>unable to collect 5/7: MRSA PCR>>negative 5/7: Strep pneumo>>negative  Antimicrobials:   Anti-infectives (From admission, onward)    Start     Dose/Rate Route Frequency Ordered Stop   02/01/24 1500  cefTRIAXone  (ROCEPHIN ) 2 g in sodium chloride  0.9 % 100 mL IVPB        2 g 200 mL/hr over 30 Minutes Intravenous  Once 02/01/24 1407 02/01/24 1534   01/29/24 1800  ceFEPIme  (MAXIPIME ) 2 g in sodium chloride  0.9 % 100 mL IVPB  Status:  Discontinued        2 g 200 mL/hr over 30 Minutes Intravenous Every 12 hours 01/29/24 1341 02/01/24 1407   01/29/24 1000  azithromycin  (ZITHROMAX ) 500 mg in sodium chloride  0.9 % 250 mL IVPB  Status:  Discontinued        500 mg 250 mL/hr over 60 Minutes Intravenous Every 24 hours 01/28/24 1343 01/31/24 1031   01/29/24 1000  cefTRIAXone  (ROCEPHIN ) 2 g in sodium chloride   0.9 % 100 mL IVPB  Status:  Discontinued        2 g 200 mL/hr over 30 Minutes Intravenous Every 24 hours 01/28/24 1343 01/29/24 1341   01/28/24 1000  cefTRIAXone  (ROCEPHIN ) 2 g in sodium chloride  0.9 % 100 mL IVPB        2 g 200 mL/hr over 30 Minutes Intravenous Once 01/28/24 0954 01/28/24 1045   01/28/24 1000  azithromycin  (ZITHROMAX ) 500 mg in sodium chloride  0.9 % 250 mL  IVPB        500 mg 250 mL/hr over 60 Minutes Intravenous  Once 01/28/24 0954 01/28/24 1135       Significant Hospital Events: Including procedures, antibiotic start and stop dates in addition to other pertinent events   5/7: Presents to ED with Acute Respiratory Distress requiring BiPAP.  With A.fib w/ RVR requiring Amiodarone  bolus and drip, developing septic/cardiogenic shock requiring initiation of peripheral Levophed   PCCM asked to admit.  Required intubation later in the evening. 5/8: Remains critically ill with multiorgan failure,  on vent.  Requiring 3 vasopressors (Levo, vaso, Epi).  Cardiology and Palliative Care consulted.  Metabolic and lactic acidosis improving, stop Bicarb gtt.  Leukocytosis worsened, broaden Ceftriaxone  to Cefepime , cultures still pending.  Echo shows LVEF 25-30% with global hypokinesis . 5/9: Overnight unable to tolerate turning due to desaturations.   Remains critically ill on the vent, remains on 3 vasopressors along with Dobutamine .  Plan to obtain internal jugular/Subclavian access to be able to follow Coox, remove femoral lines. Renal function stable, leukocytosis improving.  Diurese with 60 mg IV Lasix  x1 dose, pleural effusions improved with diuresis therefore holding off on thoracentesis. 5/10: Pt remains mechanically intubated FiO2 50%/PEEP 8.  Remains levophed  and vasopressin .  During WUA pt able to follow commands, but failed SBT due to elevated hr and bp along with tachypnea with accessory muscle use  5/11: Pt remains mechanically intubated on minimal settings.  Failed SBT due to elevated hr and bp along with tachypnea with accessory muscle use  5/12: No significant events overnight.  Remains on Vasopressin .  On minimal vent support, plan for WUQ & SBT as tolerated.  Renal function remains stable, UOP 900 cc last 24 hrs (net + 13L), will diurese with 60 mg IV Lasix  x1 dose.  Coox is 67, remains on Dobutamine , Advanced CHF consulted. 5/13: No significant  events overnight, on minimal vent support.  Plan for WUA & SBT utilizing Precedex  ~ failed SBT due to tachypnea, increased WOB, and tachycardia.   Lactic normalized, Coox 59.5.  CHF team changing dobuatime to Milrinone.  Creatinine improved to 0.98, UOP 2.5 L last 24 hrs with diuresis (net + 12.7 L), continue with Lasix  80 BID, receiving additional Diamox x1 per CHF team. 5/14: Pt remains mechanically intubated on minimal vent settings.  Remains on        milrinone gtt per heart failure team recs coox improving.  SBT better today on 8/5,       attempted PS 5/5, however pt became hypertensive/tachycardia/with increased work of        breathing, placed back on full support.  Still having goals of care discussions with POA  5/15: Overnight pt became hypotensive requiring levophed  gtt suspect secondary to propofol gtt.  Will perform WUA and SBT today once POA arrives at bedside   Interim History / Subjective:  As outlined above under "Significant Hospital Events" section  Objective   Blood pressure (!) 100/55, pulse (!) 106, temperature 97.7 F (  36.5 C), temperature source Axillary, resp. rate 16, height 5' 4.02" (1.626 m), weight 87.2 kg, SpO2 98%. CVP:  [3 mmHg-15 mmHg] 8 mmHg  Vent Mode: PRVC FiO2 (%):  [30 %] 30 % Set Rate:  [15 bmp] 15 bmp Vt Set:  [450 mL] 450 mL PEEP:  [5 cmH20] 5 cmH20 Pressure Support:  [8 cmH20] 8 cmH20 Plateau Pressure:  [14 cmH20-15 cmH20] 14 cmH20   Intake/Output Summary (Last 24 hours) at 02/05/2024 0700 Last data filed at 02/05/2024 0600 Gross per 24 hour  Intake 3938.76 ml  Output 8800 ml  Net -4861.24 ml   Filed Weights   02/03/24 0500 02/04/24 0410 02/05/24 0600  Weight: 98.5 kg 92.2 kg 87.2 kg    Examination: General: Acutely-ill appearing female, NAD mechanically intubated  HENT: Atraumatic, normocephalic, neck supple, no JVD, orally intubated Lungs: Diminished throughout, even, non labored, synchronous with the vent Cardiovascular: Irregular  irregular, no m/r/g, 1+ radial/1+ distal pulses, no edema  Abdomen: +BS x4, obese, soft, non tender  Extremities: Chronic trophic changes to bilateral lower extremities Neuro: Sedated, not following commands, PERRL  GU: Foley catheter in place draining yellow urine   Resolved Hospital Problem list   Anion gap metabolic acidosis   Assessment & Plan:   #Acute Metabolic Encephalopathy~improving  #Sedation needs in setting of mechanical ventilation Hx: HOH - Treat metabolic derangements  - Maintain a RASS goal of 0 to -1 - PAD protocol to maintain RASS goal: prn fentanyl  and propofol gtt  - Avoid sedating medications as able - Continue scheduled seroquel  - Daily wake up assessment  #Shock: septic +/- cardiogenic #New onset atrial fibrillation with rvr #Acute decompensated HFrEF  #Elevated troponin in setting of demand ischemia vs NSTEMI PMHx: HTN, palpitations Echocardiogram 01/29/24: LVEF 25-30%, LV with global hypokinesis with severe hypokinesis of the basal to mid anterior/anteroseptal wall.  Diastolic parameters indeterminate. RV systolic function normal.  RV size normal.  Mild to moderate mitral regurgitation - Continuous telemetry monitoring - Prn levophed  and vasopressin  gtts to maintain map 65 or higher  - Troponin peaked at 1,032 - Follow Coox panel - Heart failure team consulted appreciate input: milrinone @0 .25 mcg/kg/min per recs and  diurese as BP and renal function permits - Continue amiodarone  gtt for rate control  - TSH normal - Cardiology following, appreciate input  #Acute hypoxic respiratory failure #Possible community acquired pneumonia #Acute COPD exacerbation #Acute decompensated HFrEF #Bilateral pleural effusions  CTa Chest negative for PE, with medium to large bilateral pleural effusions with basilar infiltrates and atelectasis (edema vs superimposed pneumonia) - Full vent support, implement lung protective strategies - Plateau pressures less than 30 cm  H20 - Wean FiO2 & PEEP as tolerated to maintain O2 sats 88 to 92% - Follow intermittent CXR & ABG as needed - Spontaneous Breathing Trials when respiratory parameters met and mental status permits - Implement VAP Bundle - Prn bronchodilators  #Acute kidney injury #Metabolic alkalosis secondary to diuresis   - Trend BMP  - Replace electrolytes as indicated  - Strict I&O's  - Avoid nephrotoxic agents as able   #Met SIRS criteria (HR 130's, RR 30's, WBC 22.8) #Severe sepsis~resolved  #Community acquired pneumonia~treated  CTa Abdomen & Pelvis with diffuse fatty liver changes - Trend WBC and monitor fever curve  - Completed course of abx   #Diabetes mellitus  Hgb A1c is 6.6 - CBG's q4h; Target range of 140 to 180 - SSI - Follow ICU hypo/hyperglycemia protocol  Pt is critically ill with multiorgan  failure. Prognosis is guarded, high risk for further decompensation, cardiac arrest, and death.  Given current critical illness superimposed on multiple chronic co-morbidities and advanced age, overall long term prognosis is poor.  Recommend consideration of DNR/DNI status.  Palliative Care following to assist with GOC discussions.  Best Practice (right click and "Reselect all SmartList Selections" daily)   Diet/type: NPO, tube feeds DVT prophylaxis: subcutaneous lovenox  GI prophylaxis: PPI Lines: Right subclavian CVC and right radial line still needed  Foley: yes, and is still needed Code Status:  full code Last date of multidisciplinary goals of care discussion [02/03/24]  5/13: Will update pts daughter-in-law regarding pts condition and current plan of care when she arrives at bedside   Labs   CBC: Recent Labs  Lab 02/01/24 0400 02/02/24 0423 02/03/24 0441 02/04/24 0356 02/05/24 0314  WBC 11.7* 11.0* 12.7* 16.2* 14.0*  HGB 9.6* 9.4* 10.4* 11.0* 10.7*  HCT 30.5* 30.1* 32.9* 35.1* 34.5*  MCV 85.9 85.5 86.1 85.4 86.7  PLT 174 175 192 261 262    Basic Metabolic  Panel: Recent Labs  Lab 02/02/24 0423 02/02/24 2039 02/03/24 0441 02/04/24 0356 02/04/24 1558 02/05/24 0314  NA 134*  --  133* 137 136 135  K 3.5 3.6 3.5 3.1* 3.8 4.1  CL 97*  --  96* 96* 95* 90*  CO2 28  --  30 28 29  34*  GLUCOSE 150*  --  140* 188* 154* 192*  BUN 44*  --  44* 54* 60* 69*  CREATININE 1.08*  --  0.98 1.04* 1.14* 1.18*  CALCIUM 7.5*  --  8.0* 8.6* 9.2 10.3  MG 2.2  --  2.1 2.0 1.8 2.2  PHOS 3.3  --  4.1 4.3 4.5 6.4*   GFR: Estimated Creatinine Clearance: 40 mL/min (A) (by C-G formula based on SCr of 1.18 mg/dL (H)). Recent Labs  Lab 01/29/24 2001 01/30/24 0159 01/31/24 0901 02/01/24 0400 02/02/24 0423 02/03/24 0441 02/04/24 0356 02/05/24 0314  WBC  --    < >  --    < > 11.0* 12.7* 16.2* 14.0*  LATICACIDVEN 2.1*  --  1.5  --   --  1.0 1.3  --    < > = values in this interval not displayed.    Liver Function Tests: Recent Labs  Lab 02/02/24 0423 02/03/24 0441 02/04/24 0356 02/04/24 1558 02/05/24 0314  ALBUMIN 2.6* 2.9* 3.0* 3.0* 3.2*   No results for input(s): "LIPASE", "AMYLASE" in the last 168 hours. No results for input(s): "AMMONIA" in the last 168 hours.  ABG    Component Value Date/Time   PHART 7.38 01/30/2024 0345   PCO2ART 35 01/30/2024 0345   PO2ART 70 (L) 01/30/2024 0345   HCO3 20.7 01/30/2024 0345   ACIDBASEDEF 3.8 (H) 01/30/2024 0345   O2SAT 63.7 02/05/2024 0314     Coagulation Profile: No results for input(s): "INR", "PROTIME" in the last 168 hours.   Cardiac Enzymes: No results for input(s): "CKTOTAL", "CKMB", "CKMBINDEX", "TROPONINI" in the last 168 hours.  HbA1C: HbA1c POC (<> result, manual entry)  Date/Time Value Ref Range Status  04/03/2021 03:07 PM 7.1 4.0 - 5.6 % Final   Hgb A1c MFr Bld  Date/Time Value Ref Range Status  01/30/2024 01:59 AM 6.7 (H) 4.8 - 5.6 % Final    Comment:    (NOTE) Pre diabetes:          5.7%-6.4%  Diabetes:              >6.4%  Glycemic control for   <7.0% adults with  diabetes   01/29/2024 04:01 AM 6.6 (H) 4.8 - 5.6 % Final    Comment:    (NOTE) Pre diabetes:          5.7%-6.4%  Diabetes:              >6.4%  Glycemic control for   <7.0% adults with diabetes     CBG: Recent Labs  Lab 02/04/24 1115 02/04/24 1602 02/04/24 1931 02/04/24 2351 02/05/24 0346  GLUCAP 146* 179* 179* 179* 189*    Review of Systems:   Unable to assess due to intubation/sedation/critical illness  Past Medical History:  She,  has a past medical history of Cancer (HCC), CHF (congestive heart failure) (HCC), Diabetes mellitus without complication (HCC), HOH (hard of hearing), Hypertension, and Palpitations.   Surgical History:   Past Surgical History:  Procedure Laterality Date   ABDOMINAL HYSTERECTOMY  1990   CATARACT EXTRACTION W/PHACO Right 12/20/2020   Procedure: CATARACT EXTRACTION PHACO AND INTRAOCULAR LENS PLACEMENT (IOC) RIGHT DIABETIC 8.13 01:17.6 10.5%;  Surgeon: Annell Kidney, MD;  Location[facility];  Service: Ophthalmology;  Laterality: Right;   CATARACT EXTRACTION W/PHACO Left 01/03/2021   Procedure: CATARACT EXTRACTION PHACO AND INTRAOCULAR LENS PLACEMENT (IOC) LEFT DIABETIC  7.37 01:15.6 9.8%;  Surgeon: Annell Kidney, MD;  Location: [facility];  Service: Ophthalmology;  Laterality: Left;   CHOLECYSTECTOMY     DILATION AND CURETTAGE OF UTERUS  before 1990   X2   NECK SURGERY  03/30/2008   Per family, pt broke her neck   STOMACH SURGERY     tumor removed, per family     Social History:   reports that she has never smoked. She has never used smokeless tobacco. She reports that she does not drink alcohol and does not use drugs.   Family History:  Her family history is not on file.   Allergies Allergies  Allergen Reactions   Penicillins Rash     Home Medications  Prior to Admission medications   Medication Sig Start Date End Date Taking? Authorizing Provider  aspirin  EC 81 MG tablet Take 1 tablet by mouth  daily.    [provider]  Calcium Carbonate-Vit D-Min (CALCIUM 1200 PO) Take by mouth.    [provider]  furosemide  (LASIX ) 40 MG tablet TAKE 1 TABLET BY MOUTH EVERY DAY 08/27/21   Theron Flavin, MD  glipiZIDE  (GLUCOTROL  XL) 5 MG 24 hr tablet Take 3 tablets (15 mg total) by mouth daily. 11/13/21   Masoud, Javed, MD  levothyroxine (SYNTHROID, LEVOTHROID) 88 MCG tablet Take 88 mcg by mouth every morning. 12/09/17   [provider]  loratadine  (CLARITIN ) 10 MG tablet TAKE 1 TABLET BY MOUTH EVERY DAY 09/24/22   Theron Flavin, MD  losartan  (COZAAR ) 50 MG tablet TAKE 1 TABLET BY MOUTH EVERY DAY 06/10/22   Masoud, Javed, MD  lovastatin (MEVACOR) 10 MG tablet TAKE 1 TABLET ONCE A DAY ORALLY 10/20/17   [provider]  metFORMIN  (GLUCOPHAGE ) 1000 MG tablet TAKE 1 TABLET BY MOUTH TWICE A DAY 12/20/20   Theron Flavin, MD  metoprolol  succinate (TOPROL -XL) 50 MG 24 hr tablet TAKE 1 TABLET BY MOUTH DAILY. TAKE WITH OR IMMEDIATELY FOLLOWING A MEAL. 08/23/22   Theron Flavin, MD  Omega-3 Fatty Acids (FISH OIL) 500 MG CAPS Take 1,000 mg by mouth daily.    [provider]  omeprazole  (PRILOSEC) 20 MG capsule TAKE 1 CAPSULE BY MOUTH EVERY DAY 06/04/21  Theron Flavin, MD     Critical care time: 35 minutes    Janey Meek, [facility]  Pulmonary/Critical Care Pager [phone] (please enter 7 digits) PCCM Consult Pager [phone] (please enter 7 digits)

## 2024-02-05 NOTE — Progress Notes (Signed)
 Restarted Levophed  due to BP not recovering post turn and pausing of propofol. Will continue to support and attempt to wean due to the patient being previously weaned from pressors. NP [place] aware.

## 2024-02-05 NOTE — IPAL (Signed)
 Plan for family meeting on 02/06/2024 at 10:30 am after rounds with pts POA/daughter-in-law Jodie Burgert along with 2 grandsons to discuss goals of care.  Jodie requesting ICU team, heart failure team, and palliative care to be present.  Pt currently in SBT 8/5 and tolerating.  Will continue to monitor and assess pt.  Janey Meek, AGNP  Pulmonary/Critical Care Pager [phone] (please enter 7 digits) PCCM Consult Pager [phone] (please enter 7 digits)

## 2024-02-05 NOTE — Plan of Care (Signed)
 Neuro: alertness is intermittent, weaning propofol, purposeful and non-purposeful movement of extremities intermittent Resp: stable on ventilator CV: afebrile, edema improving, required restart of levophed  to stabilize BP GIGU: foley in place, OG with feeds well tolerated, no BM/passing gas Skin: grossly intact, sacral and buttock wounds stable Social: No contact with family.  Problem: Nutrition: Goal: Adequate nutrition will be maintained Outcome: Progressing   Problem: Coping: Goal: Level of anxiety will decrease Outcome: Progressing   Problem: Pain Managment: Goal: General experience of comfort will improve and/or be controlled Outcome: Progressing

## 2024-02-05 NOTE — Progress Notes (Signed)
 Placed pt back on full vent support to allow her to rest.  Pt tolerated SBT today PS 8/5.  Will perform WUA and SBT tomorrow when all family members arrive at bedside to discuss goals of treatment.  Will continue to monitor and assess pt.   Janey Meek, AGNP  Pulmonary/Critical Care Pager [phone] (please enter 7 digits) PCCM Consult Pager [phone] (please enter 7 digits)

## 2024-02-05 NOTE — Progress Notes (Signed)
 Palliative Care Progress Note, Assessment & Plan   Patient Name: Tanya Frye       Date: 02/05/2024 DOB: [birth year]  Age: 83 y.o. MRN#: [id number] Attending Physician: Cleve Dale, MD Primary Care Physician: Pcp, No Admit Date: 01/28/2024  Subjective: Patient is lying in bed, awake and alert, with a mechanical ventilatory support and mitts in place.  She acknowledges my presence.  Her daughter-in-law [name] and CCM NP Bernabe Brew are at bedside during my visit.  HPI: 83 y.o. female  with past medical history of COPD, CHF, HTN, type 2 diabetes, and HOH admitted on 01/28/2024 with acute respiratory distress requiring BiPAP.   Upon arrival to ED, patient found to be in A-fib with RVR and was given amiodarone .  Additionally, patient developed septic and cardiogenic shock requiring pressor support.  PCCM was consulted and later that evening patient required intubation.   Patient remains critically ill with multiorgan failure supported by 1 vasopressor. Leukocytosis has improved from 18.7-> 11.7 in the past 24 hours. LVEF on echo 25-30% with global hypokinesis.   PMT was consulted to support patient and family with goals of care discussions.  Summary of counseling/coordination of care: Extensive chart review completed prior to meeting patient including labs, vital signs, imaging, progress notes, orders, and available advanced directive documents from current and previous encounters.   After reviewing the patient's chart and assessing the patient at bedside, I spoke with patient and DIL in regards to symptom management and goals of care.   CCM NP converted patient to SBT with PS 8/5.  Discussed with [name] that plan is to allow patient to exercise her lungs and SBT mode and then put her back on full ventilatory support for  rest.  Plan remains for [name], patient's 2 grandsons, their significant others, and medical team to meet tomorrow at 10:30 AM to discuss updates and next steps.  [name] is specifically requesting that cardiology be involved with this discussion so that everyone can get on the same page and make cohesive recommendations.  After visiting with the patient, secure chatted with cardiology to request their presents tomorrow during this meeting.  I am off service after today but will ensure that one of my palliative medicine colleagues follows up for the meeting tomorrow.  Physical Exam Vitals reviewed.  Constitutional:      General: She is not in acute distress.    Appearance: She is obese.  HENT:     Head: Normocephalic.     Mouth/Throat:     Mouth: Mucous membranes are moist.  Eyes:     Pupils: Pupils are equal, round, and reactive to light.  Pulmonary:     Comments: Ventilatory support Abdominal:     Palpations: Abdomen is soft.  Musculoskeletal:     Comments: MAETC  Skin:    General: Skin is warm and dry.     Coloration: Skin is pale.  Neurological:     Mental Status: She is alert.     Comments: Follows basic commands  Psychiatric:        Behavior: Behavior normal.             Total Time 35 minutes   Time spent includes: Detailed review  of medical records (labs, imaging, vital signs), medically appropriate exam (mental status, respiratory, cardiac, skin), discussed with treatment team, counseling and educating patient, family and staff, documenting clinical information, medication management and coordination of care.  Judeen Nose L. Rebbeca Campi, DNP, FNP-BC Palliative Medicine Team

## 2024-02-05 NOTE — Progress Notes (Signed)
 Hearing aids returned to charger and placed between columns behind bed in room.

## 2024-02-05 NOTE — TOC Progression Note (Signed)
 Transition of Care [facility]) - Progression Note    Patient Details  Name: Tanya Frye MRN: [id number] Date of Birth: [birth year]  [facility] CM/SW Contact  Annemarie Sebree A Lolah Coghlan, RN Phone Number: 02/05/2024, 10:24 AM  Clinical Narrative:    Chart reviewed.  Noted that patient remains intubated and sedated.  Plan is for wake up assessment.  Noted that patient is End stage heart failure.  Family has not made any goals of care decisions at this time.    TOC will continue to follow.          Expected Discharge Plan and Services                                               Social Determinants of Health (SDOH) Interventions SDOH Screenings   Food Insecurity: Patient Unable To Answer (02/04/2024)  Housing: Patient Unable To Answer (01/29/2024)  Transportation Needs: Patient Unable To Answer (01/29/2024)  Utilities: Patient Unable To Answer (01/29/2024)  Alcohol Screen: Low Risk  (09/05/2021)  Depression (PHQ2-9): Low Risk  (09/05/2021)  Financial Resource Strain: Low Risk  (09/05/2021)  Physical Activity: Inactive (09/05/2021)  Social Connections: Patient Unable To Answer (01/29/2024)  Stress: No Stress Concern Present (09/05/2021)  Tobacco Use: Low Risk  (01/28/2024)    Readmission Risk Interventions     No data to display

## 2024-02-06 ENCOUNTER — Inpatient Hospital Stay

## 2024-02-06 DIAGNOSIS — R570 Cardiogenic shock: Secondary | ICD-10-CM | POA: Diagnosis not present

## 2024-02-06 DIAGNOSIS — J9600 Acute respiratory failure, unspecified whether with hypoxia or hypercapnia: Secondary | ICD-10-CM | POA: Diagnosis not present

## 2024-02-06 DIAGNOSIS — Z515 Encounter for palliative care: Secondary | ICD-10-CM | POA: Diagnosis not present

## 2024-02-06 DIAGNOSIS — G9341 Metabolic encephalopathy: Secondary | ICD-10-CM | POA: Diagnosis not present

## 2024-02-06 DIAGNOSIS — A419 Sepsis, unspecified organism: Secondary | ICD-10-CM | POA: Diagnosis not present

## 2024-02-06 DIAGNOSIS — J9601 Acute respiratory failure with hypoxia: Secondary | ICD-10-CM | POA: Diagnosis not present

## 2024-02-06 LAB — RENAL FUNCTION PANEL
Albumin: 3.6 g/dL (ref 3.5–5.0)
Albumin: 3.8 g/dL (ref 3.5–5.0)
Anion gap: 17 — ABNORMAL HIGH (ref 5–15)
Anion gap: 15 (ref 5–15)
BUN: 74 mg/dL — ABNORMAL HIGH (ref 8–23)
BUN: 79 mg/dL — ABNORMAL HIGH (ref 8–23)
CO2: 34 mmol/L — ABNORMAL HIGH (ref 22–32)
CO2: 33 mmol/L — ABNORMAL HIGH (ref 22–32)
Calcium: 11 mg/dL — ABNORMAL HIGH (ref 8.9–10.3)
Calcium: 11.1 mg/dL — ABNORMAL HIGH (ref 8.9–10.3)
Chloride: 82 mmol/L — ABNORMAL LOW (ref 98–111)
Chloride: 84 mmol/L — ABNORMAL LOW (ref 98–111)
Creatinine, Ser: 1.25 mg/dL — ABNORMAL HIGH (ref 0.44–1.00)
Creatinine, Ser: 1.22 mg/dL — ABNORMAL HIGH (ref 0.44–1.00)
GFR, Estimated: 43 mL/min — ABNORMAL LOW (ref >60)
GFR, Estimated: 45 mL/min — ABNORMAL LOW (ref >60)
Glucose, Bld: 262 mg/dL — ABNORMAL HIGH (ref 70–99)
Glucose, Bld: 191 mg/dL — ABNORMAL HIGH (ref 70–99)
Phosphorus: 7.8 mg/dL — ABNORMAL HIGH (ref 2.5–4.6)
Phosphorus: 6.6 mg/dL — ABNORMAL HIGH (ref 2.5–4.6)
Potassium: 3.1 mmol/L — ABNORMAL LOW (ref 3.5–5.1)
Potassium: 4.6 mmol/L (ref 3.5–5.1)
Sodium: 133 mmol/L — ABNORMAL LOW (ref 135–145)
Sodium: 132 mmol/L — ABNORMAL LOW (ref 135–145)

## 2024-02-06 LAB — GLUCOSE, CAPILLARY
Glucose-Capillary: 180 mg/dL — ABNORMAL HIGH (ref 70–99)
Glucose-Capillary: 210 mg/dL — ABNORMAL HIGH (ref 70–99)
Glucose-Capillary: 213 mg/dL — ABNORMAL HIGH (ref 70–99)
Glucose-Capillary: 223 mg/dL — ABNORMAL HIGH (ref 70–99)
Glucose-Capillary: 226 mg/dL — ABNORMAL HIGH (ref 70–99)
Glucose-Capillary: 233 mg/dL — ABNORMAL HIGH (ref 70–99)

## 2024-02-06 LAB — COOXEMETRY PANEL
Carboxyhemoglobin: 1.6 % — ABNORMAL HIGH (ref 0.5–1.5)
Methemoglobin: <0.7 % (ref 0.0–1.5)
O2 Saturation: 64.3 %
Total hemoglobin: 12.1 g/dL (ref 12.0–16.0)
Total oxygen content: 63.3 %

## 2024-02-06 LAB — CBC
HCT: 37.2 % (ref 36.0–46.0)
Hemoglobin: 11.5 g/dL — ABNORMAL LOW (ref 12.0–15.0)
MCH: 26.5 pg (ref 26.0–34.0)
MCHC: 30.9 g/dL (ref 30.0–36.0)
MCV: 85.7 fL (ref 80.0–100.0)
Platelets: 360 10*3/uL (ref 150–400)
RBC: 4.34 MIL/uL (ref 3.87–5.11)
RDW: 16.8 % — ABNORMAL HIGH (ref 11.5–15.5)
WBC: 17.2 10*3/uL — ABNORMAL HIGH (ref 4.0–10.5)
nRBC: 0 % (ref 0.0–0.2)

## 2024-02-06 LAB — MAGNESIUM: Magnesium: 1.8 mg/dL (ref 1.7–2.4)

## 2024-02-06 MED ORDER — POTASSIUM CHLORIDE 20 MEQ PO PACK
40.0000 meq | PACK | Freq: Once | ORAL | Status: AC
  Administered 2024-02-06: 40 meq
  Filled 2024-02-06: qty 2

## 2024-02-06 MED ORDER — ORAL CARE MOUTH RINSE: 15.0000 mL | OROMUCOSAL | Status: DC | PRN

## 2024-02-06 MED ORDER — NOREPINEPHRINE 16 MG/250ML-% IV SOLN
0.0000 ug/min | INTRAVENOUS | Status: DC
  Administered 2024-02-06: 3 ug/min via INTRAVENOUS
  Filled 2024-02-06: qty 250

## 2024-02-06 MED ORDER — ACETAZOLAMIDE 250 MG PO TABS
500.0000 mg | ORAL_TABLET | Freq: Two times a day (BID) | ORAL | Status: DC
  Administered 2024-02-06 – 2024-02-07 (×2): 500 mg
  Filled 2024-02-06 (×3): qty 2

## 2024-02-06 MED ORDER — MAGNESIUM SULFATE 2 GM/50ML IV SOLN
2.0000 g | Freq: Once | INTRAVENOUS | Status: AC
  Administered 2024-02-06: 2 g via INTRAVENOUS
  Filled 2024-02-06: qty 50

## 2024-02-06 MED ORDER — POTASSIUM CHLORIDE 10 MEQ/50ML IV SOLN
10.0000 meq | INTRAVENOUS | Status: AC
  Administered 2024-02-06 (×2): 10 meq via INTRAVENOUS
  Filled 2024-02-06 (×2): qty 50

## 2024-02-06 NOTE — Progress Notes (Signed)
 Daily Progress Note   Patient Name: Tanya Frye      Date: 02/06/2024 DOB: Jun 30, 1941  Age: 83 y.o. MRN#: [id number] Attending Physician: Cleve Dale, MD Primary Care Physician: Pcp, No Admit Date: 01/28/2024  Reason for Consultation/Follow-up: Establishing goals of care  HPI/Brief Hospital Review: 83 y.o. female  with past medical history of COPD, CHF, HTN, type 2 diabetes, and HOH admitted on 01/28/2024 with acute respiratory distress requiring BiPAP.   Upon arrival to ED, patient found to be in A-fib with RVR and was given amiodarone .  Additionally, patient developed septic and cardiogenic shock requiring pressor support.  PCCM was consulted and later that evening patient required intubation.   Patient remains critically ill with multiorgan failure supported by 1 vasopressor. Leukocytosis has improved from 18.7-> 11.7 in the past 24 hours. LVEF on echo 25-30% with global hypokinesis.   PMT was consulted to support patient and family with goals of care discussions.  Subjective: Extensive chart review has been completed prior to meeting patient including labs, vital signs, imaging, progress notes, orders, and available advanced directive documents from current and previous encounters.    Visited with Ms. Snedeker at her bedside.  She remains intubated and sedated.  Met in conference room with Jodie-daughter-in-law, 2 grandsons and granddaughter in law in collaboration with CCM team including Dr. Auston Left and Cherylann Corpus, NP as well as Dr. Alease Amend with cardiology.  Dr. Auston Left and Dr. Alease Amend provided medical updates.  Both reviewed with family long-term prognosis remains poor.  Recommend to optimize diuresis as much as possible with attempt to extubate.  Conversations had with family regarding next  steps if Ms. Tirado were to fail extubation.  Family clear and sharing that they wish for DNR status and would not reintubate or placed on BiPAP in the event of respiratory distress/failure post extubation.  Briefly mentioned the concept of comfort care in the event of respiratory distress/failure post extubation.  After family meeting, met with CCM team at bedside, decision made to extubate to heated high flow nasal cannula.  Discussed with family process and next steps regarding extubation. Ms. Schwamberger tolerated extubation with adequate oxygen saturations on heated high flow nasal cannula.  Answered and addressed all questions and concerns by family.  Returned later in the day to visit with Ms. Thwaites, remains on heated high  flow nasal cannula without signs of respiratory distress and adequate oxygen saturation levels.  No family at bedside during return visit.  PMT provider to follow-up with family and Ms. Bowditch over the weekend.  Objective:  Physical Exam Constitutional:      Interventions: She is intubated.  Cardiovascular:     Rate and Rhythm: Normal rate and regular rhythm.  Pulmonary:     Effort: Pulmonary effort is normal. No respiratory distress. She is intubated.  Skin:    General: Skin is warm and dry.  Neurological:     Mental Status: She is alert.     Motor: Weakness present.     Comments: Able to follow simple commands  Psychiatric:        Behavior: Behavior is cooperative.             Vital Signs: BP (!) 106/56   Pulse (!) 127   Temp 98.6 F (37 C) (Axillary)   Resp 17   Ht 5' 4.02" (1.626 m)   Wt 79.1 kg   SpO2 99%   BMI 29.92 kg/m  SpO2: SpO2: 99 % O2 Device: O2 Device: Heated High Flow Nasal Cannula O2 Flow Rate: O2 Flow Rate (L/min): (S) 45 L/min   Palliative Care Assessment & Plan   Assessment/Recommendation/Plan  DNR-updated by CCM team PMT to continue to follow for ongoing needs and support  Care plan was discussed with CCM team and nursing  staff  Thank you for allowing the Palliative Medicine Team to assist in the care of this patient.  Total time:  35 minutes  Time spent includes: Detailed review of medical records (labs, imaging, vital signs), medically appropriate exam (mental status, respiratory, cardiac, skin), discussed with treatment team, counseling and educating patient, family and staff, documenting clinical information, medication management and coordination of care.  Isadore Marble, DNP, AGNP-C Palliative Medicine   Please contact Palliative Medicine Team phone at [phone] for questions and concerns.

## 2024-02-06 NOTE — IPAL (Signed)
  Interdisciplinary Goals of Care Family Meeting   Date carried out: 02/06/2024  Location of the meeting: Conference room  Member's involved: Physician, Nurse Practitioner, Bedside Registered Nurse, Family Member or next of kin, Palliative care team member, and Other: CARDIOLOGIST     GOALS OF CARE DISCUSSION  The Clinical status was relayed to family in detail- DIL, grandsons  Updated and notified of patients medical condition- Patient with Progressive multiorgan failure with a very high probablity of a very minimal chance of meaningful recovery despite all aggressive and optimal medical therapy.    Family understands the situation.  They have consented and agreed to DNR status and one way extubation, she would NOT want TRACH and feeding tube   Family are satisfied with Plan of action and management. All questions answered  Additional CC time 45 mins   Tanya Frye, M.D.  [facility]  Medical Director [facility] Medical Director [facility] Cardio-Pulmonary Department

## 2024-02-06 NOTE — Progress Notes (Signed)
 Advanced Heart Failure Rounding Note  Cardiologist: Belva Boyden, MD  Chief Complaint:  Subjective:    Continues to have good urine output, weight down and now only net +1.5 L per admission.  On pressors briefly overnight, but more likely due to sedation.  Family discussion today as documented by CCM team.  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  She has been on the ventilator for almost 10 days though has shown signs of significant improvement with IV diuresis.  Would recommend extubation to high flow nasal cannula and to avoid reintubation, trach, or PEG feeding tube if she were to worsen.  Family in agreement, if she shows signs of air hunger or worsening can transition to comfort care at that time.  Appreciate palliative care involvement, would avoid ischemic workup at this time.  Can continue milrinone through today with plan to wean over the weekend, can likely transition to oral diuretics at that time as well.  Ideally may be able to go home with hospice once stabilized.  Objective:   Weight Range: 79.1 kg Body mass index is 29.92 kg/m.   Vital Signs:   Temp:  [97.9 F (36.6 C)-99.7 F (37.6 C)] 99.7 F (37.6 C) (05/16 1200) Pulse Rate:  [80-130] 120 (05/16 1300) Resp:  [14-22] 20 (05/16 1300) SpO2:  [97 %-100 %] 98 % (05/16 1300) Arterial Line BP: (56-157)/(40-69) 106/52 (05/16 1300) FiO2 (%):  [30 %-45 %] 45 % (05/16 1100) Weight:  [79.1 kg] 79.1 kg (05/16 0500) Last BM Date : 02/03/24  Weight change: Filed Weights   02/04/24 0410 02/05/24 0600 02/06/24 0500  Weight: 92.2 kg 87.2 kg 79.1 kg    Intake/Output:   Intake/Output Summary (Last 24 hours) at 02/06/2024 1321 Last data filed at 02/06/2024 1113 Gross per 24 hour  Intake 2915.57 ml  Output 5325 ml  Net -2409.43 ml      Physical Exam    GENERAL: Chronically ill-appearing PULM: Ventilated breath sounds, comfortable on  SBT CARDIAC:  JVP: mildly elevated, CVP 4         Irregular rate and rhythm, no murmurs, trace lower extremity edema  ABDOMEN: Soft, non-tender, non-distended. NEUROLOGIC: follows commands    Medications:     Scheduled Medications:  acetaZOLAMIDE  500 mg Per Tube BID   aspirin   81 mg Per Tube Daily   Chlorhexidine  Gluconate Cloth  6 each Topical Daily   enoxaparin (LOVENOX) injection  1 mg/kg Subcutaneous BID   insulin  aspart  0-20 Units Subcutaneous Q4H   insulin  aspart  4 Units Subcutaneous Q4H   insulin  glargine-yfgn  10 Units Subcutaneous BID   levothyroxine  75 mcg Per Tube Q0600   pantoprazole  (PROTONIX ) IV  40 mg Intravenous Daily   QUEtiapine  25 mg Per Tube BID   sodium chloride  flush  10-40 mL Intracatheter Q12H   spironolactone  25 mg Per Tube Daily    Infusions:  amiodarone  30 mg/hr (02/06/24 1113)   dextrose      furosemide  Stopped (02/06/24 1040)   milrinone 0.25 mcg/kg/min (02/06/24 1113)   norepinephrine  (LEVOPHED ) Adult infusion Stopped (02/06/24 1045)    PRN Medications: dextrose , dextrose , docusate, mouth rinse, polyethylene glycol, sodium chloride  flush    Patient Profile   83 y.o. female with history of obesity, DM II, HTN, HOH, hypothyroidism, gastric cancer in remission. Admitted with profound mixed septic/cardiogenic shock, acute respiratory failure and Afib with RVR   Assessment/Plan   Shock -Suspect mixed  septic (possible PNA) and cardiogenic shock -Lactic acid > 9, now cleared -Treated with empiric abx for possible PNA, now off - Suspect takotsubo given appearance on echocardiogram. Given debility will defer ischemic evaluation at this time   Acute HFrEF - New, though potentially had a history of HF per family at bedside - Echo 05/25: EF 25-30%, preserved basal segements, RV dysfunction, and poor apical windows - Excellent response, though some evidence of breaking given reduced chloride and rising BUN.  Will give Lasix  IV 160 mg once  today, can likely transition to oral diuretics tomorrow - Augmenting diuresis with IV milrinone 0.25mcg/kg/min, will continue for help with diuresis.  Would wean off tomorrow - GDMT as able if she remains hypertensive once extubated - Consider TEE/DCCV prior to discharge if she has a significant improvement in her functional status, I suspect that we may be pursuing an more palliative approach   Afib with RVR - Newly diagnosed. Suspect a consequence of ongoing shock and new HF - Rate reasonably controlled on amiodarone  gtt at 30/hr - Anticoagulated with Lovenox, eventually switch to DOAC - TSH okay. On synthroid at home   Acute respiratory failure - In setting of suspected CAP and acute HFrEF - Vent management per CCM - Continued diuresis   DM II - A1c 6.7% - Per primary    Medication concerns reviewed with patient and pharmacy team. Barriers identified: Anticoagulation, inotropes, high dose diuretics  CRITICAL CARE Performed by: Lauralee Poll   Total critical care time: 90 minutes  Critical care time was exclusive of separately billable procedures and treating other patients.  Critical care was necessary to treat or prevent imminent or life-threatening deterioration.  Critical care was time spent personally by me on the following activities: development of treatment plan with patient and/or surrogate as well as nursing, discussions with consultants, evaluation of patient's response to treatment, examination of patient, obtaining history from patient or surrogate, ordering and performing treatments and interventions, ordering and review of laboratory studies, ordering and review of radiographic studies, pulse oximetry and re-evaluation of patient's condition.   Length of Stay: 9  Lauralee Poll, MD  02/06/2024, 1:21 PM  Advanced Heart Failure Team Pager [phone] (M-F; 7a - 5p)  Please contact [facility] for night-coverage after hours (5p -7a ) and weekends on  [email or web address]

## 2024-02-06 NOTE — Procedures (Signed)
 Extubation Procedure Note  Patient Details:   Name: Tanya Frye DOB: 20-Jun-1941 MRN: [id number]   Airway Documentation:    Vent end date: 02/06/24 Vent end time: 1100   Evaluation  O2 sats: stable throughout Complications: No apparent complications Patient did tolerate procedure well. Bilateral Breath Sounds: Clear   Yes able to cough.  Tanya Frye 02/06/2024, 11:06 AM

## 2024-02-06 NOTE — TOC Progression Note (Signed)
 Transition of Care [facility]) - Progression Note    Patient Details  Name: Tanya Frye MRN: [id number] Date of Birth: [birth year]  [facility] CM/SW Contact  Gustavia Carie A Donatella Walski, RN Phone Number: 02/06/2024, 11:24 AM  Clinical Narrative:     Chart reviewed.  Noted Interdisciplinary goals of care family meeting occurred today.  Family would like a one-way extubation and patient would not want a a trach or peg feeding.  Patient was also made a DNR.    TOC will continue to follow.         Expected Discharge Plan and Services                                               Social Determinants of Health (SDOH) Interventions SDOH Screenings   Food Insecurity: Patient Unable To Answer (02/04/2024)  Housing: Patient Unable To Answer (01/29/2024)  Transportation Needs: Patient Unable To Answer (01/29/2024)  Utilities: Patient Unable To Answer (01/29/2024)  Alcohol Screen: Low Risk  (09/05/2021)  Depression (PHQ2-9): Low Risk  (09/05/2021)  Financial Resource Strain: Low Risk  (09/05/2021)  Physical Activity: Inactive (09/05/2021)  Social Connections: Patient Unable To Answer (01/29/2024)  Stress: No Stress Concern Present (09/05/2021)  Tobacco Use: Low Risk  (01/28/2024)    Readmission Risk Interventions     No data to display

## 2024-02-06 NOTE — Progress Notes (Signed)
 NAME:  Tanya Frye, MRN:  [id number], DOB:  [birth year], LOS: 9 ADMISSION DATE:  01/28/2024, CONSULTATION DATE:  01/28/2024 REFERRING MD:  Dr. Demetrios Finders, CHIEF COMPLAINT:  Shortness of Breath/Acute Respiratory Distress   Brief Pt Description / Synopsis:  83 y.o. female admitted with Acute Hypoxic Respiratory Failure and Severe Sepsis with Septic Shock due to Community Acquired Pneumonia, along with Acute decompensated HFrEF and Cardiogenic shock, and A. Fib w/ RVR.  Failed trial of BiPAP requiring intubation and mechanical ventilation.  History of Present Illness:  Tanya Frye is a 83 year old female with a past medical history significant for COPD, CHF,  hypertension, diabetes mellitus, and hard of hearing who presents to [facility] ED on 01/28/2024 due to shortness of breath.  Patient is currently unable to contribute to history due to acute respiratory distress, BiPAP, and being extremely hard of hearing, therefore history is obtained from chart review.  Per report she acutely developed respiratory distress earlier this morning.  Upon EMS arrival she was noted to be hypoxic with O2 saturations of 90% on room air which they placed her on CPAP en route to the hospital.  Given her respiratory distress upon arrival, she was transitioned to BiPAP.  ED Course: Initial Vital Signs: Temperature 96.9 F axillary, pulse 128, respiratory rate 33, blood pressure 146/119, SpO2 99% on BiPAP Significant Labs: Sodium 134, glucose 256, BUN 11, creatinine 1.1, BNP 251, lactic acid 4.8, high-sensitivity troponin 8, WBC 22.8 with neutrophilia VBG: pH 7.24/pCO2 51/pO2 58/bicarb 21.9 COVID-19/flu/RSV PCR is negative Imaging Chest X-ray>>IMPRESSION: 1. Mild cardiomegaly and vascular congestion. 2. Small bilateral pleural effusions and bibasilar atelectasis or infiltrate. Medications Administered: 2.5 L of LR boluses, IV azithromycin  and ceftriaxone , 0.5 mg IV Ativan , 4 mg morphine , 5 mg IV Cardizem   Following fluid  resuscitation in the ED, systolic blood pressure decreasing to the 70s and 80s requiring initiation of peripheral Levophed .  PCCM asked to admit for further workup and treatment.  Please see "Significant Hospital Events" section below for full detailed hospital course.  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   CHF (congestive heart failure) (HCC)    Diabetes mellitus without complication (HCC)    HOH (hard of hearing)    extremely HOH   Hypertension    Palpitations    occasional related to meds/heart races    Micro Data:  5/7: COVID/flu/RSV PCR>>negative 5/7: RVP>>negative 5/7: Blood culture x 2>>no growth to date 5/7: Tracheal aspirate>>unable to collect 5/7: MRSA PCR>>negative 5/7: Strep pneumo>>negative  Antimicrobials:   Anti-infectives (From admission, onward)    Start     Dose/Rate Route Frequency Ordered Stop   02/01/24 1500  cefTRIAXone  (ROCEPHIN ) 2 g in sodium chloride  0.9 % 100 mL IVPB        2 g 200 mL/hr over 30 Minutes Intravenous  Once 02/01/24 1407 02/01/24 1534   01/29/24 1800  ceFEPIme  (MAXIPIME ) 2 g in sodium chloride  0.9 % 100 mL IVPB  Status:  Discontinued        2 g 200 mL/hr over 30 Minutes Intravenous Every 12 hours 01/29/24 1341 02/01/24 1407   01/29/24 1000  azithromycin  (ZITHROMAX ) 500 mg in sodium chloride  0.9 % 250 mL IVPB  Status:  Discontinued        500 mg 250 mL/hr over 60 Minutes Intravenous Every 24 hours 01/28/24 1343 01/31/24 1031   01/29/24 1000  cefTRIAXone  (ROCEPHIN ) 2 g in sodium chloride   0.9 % 100 mL IVPB  Status:  Discontinued        2 g 200 mL/hr over 30 Minutes Intravenous Every 24 hours 01/28/24 1343 01/29/24 1341   01/28/24 1000  cefTRIAXone  (ROCEPHIN ) 2 g in sodium chloride  0.9 % 100 mL IVPB        2 g 200 mL/hr over 30 Minutes Intravenous Once 01/28/24 0954 01/28/24 1045   01/28/24 1000  azithromycin  (ZITHROMAX ) 500 mg in sodium chloride  0.9 % 250 mL  IVPB        500 mg 250 mL/hr over 60 Minutes Intravenous  Once 01/28/24 0954 01/28/24 1135       Significant Hospital Events: Including procedures, antibiotic start and stop dates in addition to other pertinent events   5/7: Presents to ED with Acute Respiratory Distress requiring BiPAP.  With A.fib w/ RVR requiring Amiodarone  bolus and drip, developing septic/cardiogenic shock requiring initiation of peripheral Levophed   PCCM asked to admit.  Required intubation later in the evening. 5/8: Remains critically ill with multiorgan failure,  on vent.  Requiring 3 vasopressors (Levo, vaso, Epi).  Cardiology and Palliative Care consulted.  Metabolic and lactic acidosis improving, stop Bicarb gtt.  Leukocytosis worsened, broaden Ceftriaxone  to Cefepime , cultures still pending.  Echo shows LVEF 25-30% with global hypokinesis . 5/9: Overnight unable to tolerate turning due to desaturations.   Remains critically ill on the vent, remains on 3 vasopressors along with Dobutamine .  Plan to obtain internal jugular/Subclavian access to be able to follow Coox, remove femoral lines. Renal function stable, leukocytosis improving.  Diurese with 60 mg IV Lasix  x1 dose, pleural effusions improved with diuresis therefore holding off on thoracentesis. 5/10: Pt remains mechanically intubated FiO2 50%/PEEP 8.  Remains levophed  and vasopressin .  During WUA pt able to follow commands, but failed SBT due to elevated hr and bp along with tachypnea with accessory muscle use  5/11: Pt remains mechanically intubated on minimal settings.  Failed SBT due to elevated hr and bp along with tachypnea with accessory muscle use  5/12: No significant events overnight.  Remains on Vasopressin .  On minimal vent support, plan for WUQ & SBT as tolerated.  Renal function remains stable, UOP 900 cc last 24 hrs (net + 13L), will diurese with 60 mg IV Lasix  x1 dose.  Coox is 67, remains on Dobutamine , Advanced CHF consulted. 5/13: No significant  events overnight, on minimal vent support.  Plan for WUA & SBT utilizing Precedex  ~ failed SBT due to tachypnea, increased WOB, and tachycardia.   Lactic normalized, Coox 59.5.  CHF team changing dobuatime to Milrinone.  Creatinine improved to 0.98, UOP 2.5 L last 24 hrs with diuresis (net + 12.7 L), continue with Lasix  80 BID, receiving additional Diamox x1 per CHF team. 5/14: Pt remains mechanically intubated on minimal vent settings.  Remains on        milrinone gtt per heart failure team recs coox improving.  SBT better today on 8/5,       attempted PS 5/5, however pt became hypertensive/tachycardia/with increased work of        breathing, placed back on full support.  Still having goals of care discussions with POA  5/15: Overnight pt became hypotensive requiring levophed  gtt suspect secondary to propofol gtt.  Will perform WUA and SBT today once POA arrives at bedside  5/16: No significant events overnight.  Afebrile, requiring Levophed  3 mcg and remains on Milrinone, Coox 64 (63 previously).  UOP 6.8 L last 24 hrs (net +  1.5 L).  On minimal vent support (30% FiO2 and 5 PEEP), WUA/SBT as tolerated.  Family meeting (ICU, CHF, and Palliative Care teams present) for GOC. Decision made to perform 1 way extubation once optimized, no reintubation, code status changed to DNR/DNI.  Tolerated PSV 5/5 and following commands, extubated to HHFNC.   Interim History / Subjective:  As outlined above under "Significant Hospital Events" section  Objective   Blood pressure (!) 106/56, pulse (!) 107, temperature 97.9 F (36.6 C), temperature source Axillary, resp. rate 18, height 5' 4.02" (1.626 m), weight 79.1 kg, SpO2 100%. CVP:  [0 mmHg-13 mmHg] 4 mmHg  Vent Mode: PRVC FiO2 (%):  [30 %] 30 % Set Rate:  [15 bmp] 15 bmp Vt Set:  [450 mL] 450 mL PEEP:  [5 cmH20] 5 cmH20 Pressure Support:  [8 cmH20] 8 cmH20 Plateau Pressure:  [13 cmH20-16 cmH20] 13 cmH20   Intake/Output Summary (Last 24 hours) at 02/06/2024  0729 Last data filed at 02/06/2024 0600 Gross per 24 hour  Intake 2484.89 ml  Output 6875 ml  Net -4390.11 ml   Filed Weights   02/04/24 0410 02/05/24 0600 02/06/24 0500  Weight: 92.2 kg 87.2 kg 79.1 kg    Examination: General: Acute on chronically ill appearing obese female, NAD, mechanically intubated  HENT: Atraumatic, normocephalic, neck supple, no JVD, orally intubated Lungs: Mechanical breath sounds throughout, even, non labored, overbreathing the vent Cardiovascular: Irregular irregular, no m/r/g, 1+ radial/1+ distal pulses, no edema  Abdomen: +BS x4, obese, soft, non tender, no guarding or rebound tenderness Extremities: Chronic trophic changes to bilateral lower extremities Neuro: Sedated, arouses to voice and follows simple commands, no focal deficits noted, PERRL  GU: Foley catheter in place draining yellow urine   Resolved Hospital Problem list   Anion gap metabolic acidosis   Assessment & Plan:   #Acute Metabolic Encephalopathy~improving  #Sedation needs in setting of mechanical ventilation Hx: HOH -Treat metabolic derangements as outlined below -Maintain a RASS goal of 0 to -1 -Propofol as needed to maintain RASS goal -Avoid sedating medications as able -Daily wake up assessment -Continue Seroquel  #Shock: septic + cardiogenic #New onset atrial fibrillation with rvr #Acute decompensated HFrEF  #Elevated troponin in setting of demand ischemia vs NSTEMI PMHx: HTN, palpitations Echocardiogram 01/29/24: LVEF 25-30%, LV with global hypokinesis with severe hypokinesis of the basal to mid anterior/anteroseptal wall.  Diastolic parameters indeterminate. RV systolic function normal.  RV size normal.  Mild to moderate mitral regurgitation - Continuous telemetry monitoring - Prn levophed  and vasopressin  gtts to maintain map 65 or higher  - Troponin peaked at 1,032 - Follow Coox panel - Heart failure team consulted appreciate input: milrinone @0 .25 mcg/kg/min per recs  and  diurese as BP and renal function permits - Continue amiodarone  gtt for rate control  - Lovenox BID for anticoagulation  - TSH normal - Cardiology following, appreciate input  #Acute hypoxic respiratory failure #Possible community acquired pneumonia ~ TREATED #Acute COPD exacerbation #Acute decompensated HFrEF #Bilateral pleural effusions  CTa Chest negative for PE, with medium to large bilateral pleural effusions with basilar infiltrates and atelectasis (edema vs superimposed pneumonia) -Full vent support, implement lung protective strategies -Plateau pressures less than 30 cm H20 -Wean FiO2 & PEEP as tolerated to maintain O2 sats 88 to 92% -Follow intermittent Chest X-ray & ABG as needed -Spontaneous Breathing Trials when respiratory parameters met and mental status permits -Implement VAP Bundle -Prn Bronchodilators -Completed ABX as above -Diuresis as BP and renal function permits  #Acute  kidney injury #Metabolic alkalosis secondary to diuresis   -Monitor I&O's / urinary output -Follow BMP -Ensure adequate renal perfusion -Avoid nephrotoxic agents as able -Replace electrolytes as indicated ~ Pharmacy following for assistance with electrolyte replacement -Continue Diamox  #Met SIRS criteria (HR 130's, RR 30's, WBC 22.8) #Severe sepsis~resolved  #Community acquired pneumonia~treated  CTa Abdomen & Pelvis with diffuse fatty liver changes - Trend WBC and monitor fever curve  - Completed course of abx   #Diabetes mellitus  Hgb A1c is 6.6 - CBG's q4h; Target range of 140 to 180 - SSI - Follow ICU hypo/hyperglycemia protocol   Pt is critically ill with multiorgan failure. Prognosis is guarded, high risk for further decompensation, cardiac arrest, and death.  Given current critical illness superimposed on multiple chronic co-morbidities and advanced age, overall long term prognosis is poor.  Recommend consideration of DNR/DNI status.  Palliative Care following to assist  with GOC discussions.   Best Practice (right click and "Reselect all SmartList Selections" daily)   Diet/type: NPO, tube feeds DVT prophylaxis: subcutaneous lovenox treatment dose for A. Fib GI prophylaxis: PPI Lines: Right subclavian CVC and right radial line still needed  Foley: yes, and is still needed Code Status:  full code Last date of multidisciplinary goals of care discussion [02/06/24]  5/16: Updated pt's daughter in law Jodie and pt's 2 grandsons at bedside on plan of care.  All questions answered.  Labs   CBC: Recent Labs  Lab 02/02/24 0423 02/03/24 0441 02/04/24 0356 02/05/24 0314 02/06/24 0412  WBC 11.0* 12.7* 16.2* 14.0* 17.2*  HGB 9.4* 10.4* 11.0* 10.7* 11.5*  HCT 30.1* 32.9* 35.1* 34.5* 37.2  MCV 85.5 86.1 85.4 86.7 85.7  PLT 175 192 261 262 360    Basic Metabolic Panel: Recent Labs  Lab 02/03/24 0441 02/04/24 0356 02/04/24 1558 02/05/24 0314 02/06/24 0409 02/06/24 0412  NA 133* 137 136 135 133*  --   K 3.5 3.1* 3.8 4.1 3.1*  --   CL 96* 96* 95* 90* 82*  --   CO2 30 28 29  34* 34*  --   GLUCOSE 140* 188* 154* 192* 262*  --   BUN 44* 54* 60* 69* 74*  --   CREATININE 0.98 1.04* 1.14* 1.18* 1.25*  --   CALCIUM 8.0* 8.6* 9.2 10.3 11.0*  --   MG 2.1 2.0 1.8 2.2  --  1.8  PHOS 4.1 4.3 4.5 6.4* 7.8*  --    GFR: Estimated Creatinine Clearance: 35.9 mL/min (A) (by C-G formula based on SCr of 1.25 mg/dL (H)). Recent Labs  Lab 01/31/24 0901 02/01/24 0400 02/03/24 0441 02/04/24 0356 02/05/24 0314 02/06/24 0412  WBC  --    < > 12.7* 16.2* 14.0* 17.2*  LATICACIDVEN 1.5  --  1.0 1.3  --   --    < > = values in this interval not displayed.    Liver Function Tests: Recent Labs  Lab 02/03/24 0441 02/04/24 0356 02/04/24 1558 02/05/24 0314 02/06/24 0409  ALBUMIN 2.9* 3.0* 3.0* 3.2* 3.6   No results for input(s): "LIPASE", "AMYLASE" in the last 168 hours. No results for input(s): "AMMONIA" in the last 168 hours.  ABG    Component Value  Date/Time   PHART 7.38 01/30/2024 0345   PCO2ART 35 01/30/2024 0345   PO2ART 70 (L) 01/30/2024 0345   HCO3 20.7 01/30/2024 0345   ACIDBASEDEF 3.8 (H) 01/30/2024 0345   O2SAT 64.3 02/06/2024 0412     Coagulation Profile: No results for input(s): "INR", "PROTIME"  in the last 168 hours.   Cardiac Enzymes: No results for input(s): "CKTOTAL", "CKMB", "CKMBINDEX", "TROPONINI" in the last 168 hours.  HbA1C: HbA1c POC (<> result, manual entry)  Date/Time Value Ref Range Status  04/03/2021 03:07 PM 7.1 4.0 - 5.6 % Final   Hgb A1c MFr Bld  Date/Time Value Ref Range Status  01/30/2024 01:59 AM 6.7 (H) 4.8 - 5.6 % Final    Comment:    (NOTE) Pre diabetes:          5.7%-6.4%  Diabetes:              >6.4%  Glycemic control for   <7.0% adults with diabetes   01/29/2024 04:01 AM 6.6 (H) 4.8 - 5.6 % Final    Comment:    (NOTE) Pre diabetes:          5.7%-6.4%  Diabetes:              >6.4%  Glycemic control for   <7.0% adults with diabetes     CBG: Recent Labs  Lab 02/05/24 1138 02/05/24 1539 02/05/24 1946 02/05/24 2310 02/06/24 0351  GLUCAP 181* 172* 183* 199* 223*    Review of Systems:   Unable to assess due to intubation/sedation/critical illness  Past Medical History:  She,  has a past medical history of Cancer (HCC), CHF (congestive heart failure) (HCC), Diabetes mellitus without complication (HCC), HOH (hard of hearing), Hypertension, and Palpitations.   Surgical History:   Past Surgical History:  Procedure Laterality Date   ABDOMINAL HYSTERECTOMY  1990   CATARACT EXTRACTION W/PHACO Right 12/20/2020   Procedure: CATARACT EXTRACTION PHACO AND INTRAOCULAR LENS PLACEMENT (IOC) RIGHT DIABETIC 8.13 01:17.6 10.5%;  Surgeon: Annell Kidney, MD;  Location: [facility];  Service: Ophthalmology;  Laterality: Right;   CATARACT EXTRACTION W/PHACO Left 01/03/2021   Procedure: CATARACT EXTRACTION PHACO AND INTRAOCULAR LENS PLACEMENT (IOC) LEFT DIABETIC  7.37  01:15.6 9.8%;  Surgeon: Annell Kidney, MD;  Location: [facility];  Service: Ophthalmology;  Laterality: Left;   CHOLECYSTECTOMY     DILATION AND CURETTAGE OF UTERUS  before 1990   X2   NECK SURGERY  03/30/2008   Per family, pt broke her neck   STOMACH SURGERY     tumor removed, per family     Social History:   reports that she has never smoked. She has never used smokeless tobacco. She reports that she does not drink alcohol and does not use drugs.   Family History:  Her family history is not on file.   Allergies Allergies  Allergen Reactions   Penicillins Rash     Home Medications  Prior to Admission medications   Medication Sig Start Date End Date Taking? Authorizing Provider  aspirin  EC 81 MG tablet Take 1 tablet by mouth daily.    [provider]  Calcium Carbonate-Vit D-Min (CALCIUM 1200 PO) Take by mouth.    [provider]  furosemide  (LASIX ) 40 MG tablet TAKE 1 TABLET BY MOUTH EVERY DAY 08/27/21   Theron Flavin, MD  glipiZIDE  (GLUCOTROL  XL) 5 MG 24 hr tablet Take 3 tablets (15 mg total) by mouth daily. 11/13/21   Masoud, Javed, MD  levothyroxine (SYNTHROID, LEVOTHROID) 88 MCG tablet Take 88 mcg by mouth every morning. 12/09/17   [provider]  loratadine  (CLARITIN ) 10 MG tablet TAKE 1 TABLET BY MOUTH EVERY DAY 09/24/22   Theron Flavin, MD  losartan  (COZAAR ) 50 MG tablet TAKE 1 TABLET BY MOUTH EVERY DAY 06/10/22   Masoud,  Javed, MD  lovastatin (MEVACOR) 10 MG tablet TAKE 1 TABLET ONCE A DAY ORALLY 10/20/17   [provider]  metFORMIN  (GLUCOPHAGE ) 1000 MG tablet TAKE 1 TABLET BY MOUTH TWICE A DAY 12/20/20   Theron Flavin, MD  metoprolol  succinate (TOPROL -XL) 50 MG 24 hr tablet TAKE 1 TABLET BY MOUTH DAILY. TAKE WITH OR IMMEDIATELY FOLLOWING A MEAL. 08/23/22   Theron Flavin, MD  Omega-3 Fatty Acids (FISH OIL) 500 MG CAPS Take 1,000 mg by mouth daily.    [provider]  omeprazole  (PRILOSEC) 20 MG capsule TAKE 1 CAPSULE  BY MOUTH EVERY DAY 06/04/21   Theron Flavin, MD     Critical care time: 40 minutes    Cherylann Corpus, AGACNP-BC [facility] Prefer epic messenger for cross cover needs If after hours, please call E-link

## 2024-02-06 NOTE — Progress Notes (Signed)
 PHARMACY CONSULT NOTE - ELECTROLYTES  Pharmacy Consult for Electrolyte Monitoring and Replacement   Recent Labs: Height: 5' 4.02" (162.6 cm) Weight: 79.1 kg (174 lb 6.1 oz) IBW/kg (Calculated) : 54.74 Estimated Creatinine Clearance: 35.9 mL/min (A) (by C-G formula based on SCr of 1.25 mg/dL (H)). Potassium (mmol/L)  Date Value  02/06/2024 3.1 (L)   Magnesium  (mg/dL)  Date Value  16/06/9603 1.8   Calcium (mg/dL)  Date Value  54/05/8118 11.0 (H)   Albumin (g/dL)  Date Value  14/78/2956 3.6   Phosphorus (mg/dL)  Date Value  21/30/8657 7.8 (H)   Sodium (mmol/L)  Date Value  02/06/2024 133 (L)   Assessment  Tanya Frye is a 83 y.o. female presenting with septic shock 2/2 CAP. PMH significant for COPD, CHF,  hypertension, T2DM. Pharmacy has been consulted to monitor and replace electrolytes.  diuretics: furosemide  160 mg IV BID, acetazolamide 500 mg po BID, spironolactone 25 mg po daily  Goal of Therapy:  potassium 4.0 - 5.1 mmol/L magnesium  2.0 - 2.4 mg/dL All Other Electrolytes WNL  Plan:  40 mEq KCl per tube x 1 10 mEq IV KCl x 2 per NP 2 grams IV magnesium  sulfate x 1 Re-check BMP when above completed and again tomorrow AM  Thank you for allowing pharmacy to be a part of this patient's care.  Adalberto Acton 02/06/2024 7:06 AM

## 2024-02-06 NOTE — Plan of Care (Signed)
 Neuro: weaning sedation, will open one eye to pain, does not follow commands tonight Resp: stable on ventilator CV: afebrile, edema improving, continues to require pressor support despite weaning sedation GIGU: OG with feeds-well tolerated, no BM-passing gas, foley in place  Skin: labial lacerations present-see photo in chart, ointment heavily applied, continues to ooze blood, skin otherwise intact and/or stable Social: No contact with family  Problem: Nutrition: Goal: Adequate nutrition will be maintained Outcome: Progressing   Problem: Coping: Goal: Level of anxiety will decrease Outcome: Progressing   Problem: Pain Managment: Goal: General experience of comfort will improve and/or be controlled Outcome: Progressing   Problem: Respiratory: Goal: Ability to maintain a clear airway and adequate ventilation will improve Outcome: Progressing

## 2024-02-06 NOTE — Plan of Care (Signed)
  Problem: Clinical Measurements: Goal: Ability to maintain clinical measurements within normal limits will improve Outcome: Progressing Goal: Diagnostic test results will improve Outcome: Progressing Goal: Respiratory complications will improve Outcome: Progressing   Problem: Coping: Goal: Level of anxiety will decrease Outcome: Progressing   Problem: Elimination: Goal: Will not experience complications related to urinary retention Outcome: Progressing   Problem: Pain Managment: Goal: General experience of comfort will improve and/or be controlled Outcome: Progressing   Problem: Safety: Goal: Ability to remain free from injury will improve Outcome: Progressing   Problem: Tissue Perfusion: Goal: Adequacy of tissue perfusion will improve Outcome: Progressing   Problem: Respiratory: Goal: Ability to maintain a clear airway and adequate ventilation will improve Outcome: Progressing   Problem: Cardiac: Goal: Ability to maintain an adequate cardiac output will improve Outcome: Progressing   Problem: Respiratory: Goal: Will regain and/or maintain adequate ventilation Outcome: Progressing   Problem: Urinary Elimination: Goal: Ability to achieve and maintain adequate renal perfusion and functioning will improve Outcome: Progressing

## 2024-02-06 NOTE — Inpatient Diabetes Management (Signed)
 Inpatient Diabetes Program Recommendations  AACE/ADA: New Consensus Statement on Inpatient Glycemic Control (2015)  Target Ranges:  Prepandial:   less than 140 mg/dL      Peak postprandial:   less than 180 mg/dL (1-2 hours)      Critically ill patients:  140 - 180 mg/dL    Latest Reference Range & Units 02/04/24 23:51 02/05/24 03:46 02/05/24 07:22 02/05/24 11:38 02/05/24 15:39 02/05/24 19:46  Glucose-Capillary 70 - 99 mg/dL 161 (H)  8 units Novolog   189 (H)  8 units Novolog   180 (H)  8 units Novolog   181 (H)  8 units Novolog   10 units Semglee   172 (H)  8 units Novolog   183 (H)  8 units Novolog   10 units Semglee    (H): Data is abnormally high  Latest Reference Range & Units 02/05/24 23:10 02/06/24 03:51 02/06/24 07:49  Glucose-Capillary 70 - 99 mg/dL 096 (H)  8 units Novolog   223 (H)  11 units Novolog   226 (H)  11 units Novolog    (H): Data is abnormally high     Home DM Meds: Glipizide  15 mg daily Metformin  1000 mg BID  Current Orders: Semglee  10 units BID Novolog  4 units Q4 hours Novolog  Resistant Correction Scale/ SSI (0-20 units) Q4H    MD--Note slight rise in CBGs overnight.    If Tube Feeds remain running today, may consider increasing the Novolog  tube feed coverage to 6 units Q4 hours    --Will follow patient during hospitalization--  Langston Pippins RN, MSN, CDCES Diabetes Coordinator Inpatient Glycemic Control Team Team Pager: [phone] (8a-5p)

## 2024-02-07 DIAGNOSIS — Z515 Encounter for palliative care: Secondary | ICD-10-CM | POA: Diagnosis not present

## 2024-02-07 DIAGNOSIS — Z66 Do not resuscitate: Secondary | ICD-10-CM | POA: Diagnosis not present

## 2024-02-07 DIAGNOSIS — I4891 Unspecified atrial fibrillation: Secondary | ICD-10-CM | POA: Diagnosis not present

## 2024-02-07 DIAGNOSIS — J9600 Acute respiratory failure, unspecified whether with hypoxia or hypercapnia: Secondary | ICD-10-CM | POA: Diagnosis not present

## 2024-02-07 DIAGNOSIS — Z789 Other specified health status: Secondary | ICD-10-CM | POA: Diagnosis not present

## 2024-02-07 DIAGNOSIS — R570 Cardiogenic shock: Secondary | ICD-10-CM | POA: Diagnosis not present

## 2024-02-07 LAB — RENAL FUNCTION PANEL
Albumin: 3.7 g/dL (ref 3.5–5.0)
Anion gap: 14 (ref 5–15)
BUN: 89 mg/dL — ABNORMAL HIGH (ref 8–23)
CO2: 33 mmol/L — ABNORMAL HIGH (ref 22–32)
Calcium: 10.5 mg/dL — ABNORMAL HIGH (ref 8.9–10.3)
Chloride: 87 mmol/L — ABNORMAL LOW (ref 98–111)
Creatinine, Ser: 1.32 mg/dL — ABNORMAL HIGH (ref 0.44–1.00)
GFR, Estimated: 41 mL/min — ABNORMAL LOW (ref >60)
Glucose, Bld: 173 mg/dL — ABNORMAL HIGH (ref 70–99)
Phosphorus: 6.1 mg/dL — ABNORMAL HIGH (ref 2.5–4.6)
Potassium: 3.2 mmol/L — ABNORMAL LOW (ref 3.5–5.1)
Sodium: 134 mmol/L — ABNORMAL LOW (ref 135–145)

## 2024-02-07 LAB — MAGNESIUM: Magnesium: 2.3 mg/dL (ref 1.7–2.4)

## 2024-02-07 LAB — CBC
HCT: 36.7 % (ref 36.0–46.0)
Hemoglobin: 11.6 g/dL — ABNORMAL LOW (ref 12.0–15.0)
MCH: 26.8 pg (ref 26.0–34.0)
MCHC: 31.6 g/dL (ref 30.0–36.0)
MCV: 84.8 fL (ref 80.0–100.0)
Platelets: 331 10*3/uL (ref 150–400)
RBC: 4.33 MIL/uL (ref 3.87–5.11)
RDW: 16.8 % — ABNORMAL HIGH (ref 11.5–15.5)
WBC: 15.1 10*3/uL — ABNORMAL HIGH (ref 4.0–10.5)
nRBC: 0 % (ref 0.0–0.2)

## 2024-02-07 LAB — GLUCOSE, CAPILLARY
Glucose-Capillary: 173 mg/dL — ABNORMAL HIGH (ref 70–99)
Glucose-Capillary: 166 mg/dL — ABNORMAL HIGH (ref 70–99)
Glucose-Capillary: 249 mg/dL — ABNORMAL HIGH (ref 70–99)
Glucose-Capillary: 189 mg/dL — ABNORMAL HIGH (ref 70–99)
Glucose-Capillary: 173 mg/dL — ABNORMAL HIGH (ref 70–99)
Glucose-Capillary: 195 mg/dL — ABNORMAL HIGH (ref 70–99)

## 2024-02-07 LAB — COOXEMETRY PANEL
Carboxyhemoglobin: 2.1 % — ABNORMAL HIGH (ref 0.5–1.5)
Methemoglobin: 0.7 % (ref 0.0–1.5)
O2 Saturation: 72.2 %
Total hemoglobin: 12.1 g/dL (ref 12.0–16.0)
Total oxygen content: 70.2 %

## 2024-02-07 MED ORDER — POTASSIUM CHLORIDE CRYS ER 20 MEQ PO TBCR: 40.0000 meq | EXTENDED_RELEASE_TABLET | Freq: Once | ORAL | Status: DC

## 2024-02-07 MED ORDER — LEVOTHYROXINE SODIUM 50 MCG PO TABS
75.0000 ug | ORAL_TABLET | Freq: Every day | ORAL | Status: DC
  Administered 2024-02-08 – 2024-02-13 (×6): 75 ug via ORAL
  Filled 2024-02-07 (×2): qty 1
  Filled 2024-02-07 (×2): qty 2
  Filled 2024-02-07 (×2): qty 1

## 2024-02-07 MED ORDER — POTASSIUM CHLORIDE 20 MEQ PO PACK
40.0000 meq | PACK | Freq: Every day | ORAL | Status: DC
  Administered 2024-02-07: 40 meq via ORAL
  Filled 2024-02-07: qty 2

## 2024-02-07 MED ORDER — AMIODARONE HCL 200 MG PO TABS
200.0000 mg | ORAL_TABLET | Freq: Two times a day (BID) | ORAL | Status: DC
  Administered 2024-02-07 – 2024-02-13 (×13): 200 mg via ORAL
  Filled 2024-02-07 (×13): qty 1

## 2024-02-07 MED ORDER — SPIRONOLACTONE 25 MG PO TABS
25.0000 mg | ORAL_TABLET | Freq: Every day | ORAL | Status: DC
  Administered 2024-02-08 – 2024-02-13 (×6): 25 mg via ORAL
  Filled 2024-02-07 (×6): qty 1

## 2024-02-07 MED ORDER — ACETAZOLAMIDE 250 MG PO TABS: 500.0000 mg | ORAL_TABLET | Freq: Two times a day (BID) | ORAL | Status: DC

## 2024-02-07 MED ORDER — POTASSIUM CHLORIDE 20 MEQ PO PACK: 40.0000 meq | PACK | Freq: Once | ORAL | Status: DC

## 2024-02-07 MED ORDER — POTASSIUM CHLORIDE CRYS ER 20 MEQ PO TBCR
40.0000 meq | EXTENDED_RELEASE_TABLET | Freq: Every day | ORAL | Status: DC
Start: 2024-02-07 — End: 2024-02-07

## 2024-02-07 MED ORDER — QUETIAPINE FUMARATE 25 MG PO TABS
25.0000 mg | ORAL_TABLET | Freq: Two times a day (BID) | ORAL | Status: DC
  Administered 2024-02-07 – 2024-02-12 (×10): 25 mg via ORAL
  Filled 2024-02-07 (×10): qty 1

## 2024-02-07 MED ORDER — ENOXAPARIN SODIUM 80 MG/0.8ML IJ SOSY
1.0000 mg/kg | PREFILLED_SYRINGE | Freq: Two times a day (BID) | INTRAMUSCULAR | Status: DC
  Administered 2024-02-08 (×2): 80 mg via SUBCUTANEOUS
  Filled 2024-02-07 (×2): qty 0.8

## 2024-02-07 MED ORDER — POTASSIUM CHLORIDE 20 MEQ PO PACK
40.0000 meq | PACK | Freq: Once | ORAL | Status: AC
  Administered 2024-02-07: 40 meq via ORAL
  Filled 2024-02-07: qty 2

## 2024-02-07 MED ORDER — ASPIRIN 81 MG PO CHEW
81.0000 mg | CHEWABLE_TABLET | Freq: Every day | ORAL | Status: DC
  Administered 2024-02-08 – 2024-02-13 (×6): 81 mg via ORAL
  Filled 2024-02-07 (×6): qty 1

## 2024-02-07 NOTE — Evaluation (Signed)
 Physical Therapy Evaluation Patient Details Name: Tanya Frye MRN: [id number] DOB: 1940/10/30 Today's Date: 02/07/2024  History of Present Illness  83 y/o female presented to ED on 01/28/24 for SOB. Intubated 5/7-5/16 (1 way extubation). Admitted with septic shock with progressive multi organ failure. PMH: COPD, CHF, HTN, T2DM  Clinical Impression  Patient admitted with the above. PTA, patient lives with grandson and reports independence with no AD. Oriented to self and place throughout session and following one step commands inconsistently. Unsure if command following is due to patient being [facility] or cognition. Required totalA+2 to complete bed mobility. Required up to maxA for sitting balance on EOB but able to perform modified sit up EOB x 3 with minA. VSS on 5L O2 HFNC. Patient will benefit from skilled PT services during acute stay to address listed deficits. Patient will benefit from ongoing therapy at discharge to maximize functional independence and safety.         If plan is discharge home, recommend the following: Two people to help with walking and/or transfers;Two people to help with bathing/dressing/bathroom;Help with stairs or ramp for entrance;Assist for transportation;Direct supervision/assist for financial management;Direct supervision/assist for medications management;Assistance with cooking/housework;Assistance with feeding   Can travel by private vehicle   No    Equipment Recommendations Other (comment) (TBD)  Recommendations for Other Services       Functional Status Assessment Patient has had a recent decline in their functional status and/or demonstrates limited ability to make significant improvements in function in a reasonable and predictable amount of time     Precautions / Restrictions Precautions Precautions: Fall Recall of Precautions/Restrictions: Impaired Restrictions Weight Bearing Restrictions Per Provider Order: No      Mobility  Bed Mobility Overal  bed mobility: Needs Assistance Bed Mobility: Supine to Sit, Sit to Supine     Supine to sit: Total assist, +2 for physical assistance, +2 for safety/equipment Sit to supine: Total assist, +2 for physical assistance, +2 for safety/equipment   General bed mobility comments: slight initiation to bring LLE towards EOB, otherwise totalA+2    Transfers                   General transfer comment: deferred due to poor sitting balance    Ambulation/Gait                  Stairs            Wheelchair Mobility     Tilt Bed    Modified Rankin (Stroke Patients Only)       Balance Overall balance assessment: Needs assistance Sitting-balance support: Bilateral upper extremity supported, Feet unsupported Sitting balance-Leahy Scale: Poor Sitting balance - Comments: required up to maxA to maintain sitting balance                                     Pertinent Vitals/Pain Pain Assessment Pain Assessment: Faces Faces Pain Scale: No hurt Pain Intervention(s): Monitored during session    Home Living Family/patient expects to be discharged to:: Private residence Living Arrangements: Other (Comment) (grandson) Available Help at Discharge: Family Type of Home: House             Additional Comments: unsure of home setup due to patient's cognition    Prior Function Prior Level of Function : Independent/Modified Independent;Driving             Mobility Comments: per patient, ambulatory  with no AD and driving       Extremity/Trunk Assessment   Upper Extremity Assessment Upper Extremity Assessment: Defer to OT evaluation    Lower Extremity Assessment Lower Extremity Assessment: Generalized weakness    Cervical / Trunk Assessment Cervical / Trunk Assessment: Kyphotic  Communication   Communication Communication: Impaired Factors Affecting Communication: Hearing impaired    Cognition Arousal: Alert Behavior During Therapy: Flat  affect   PT - Cognitive impairments: No family/caregiver present to determine baseline                       PT - Cognition Comments: oriented to self and place Following commands: Impaired Following commands impaired: Follows one step commands inconsistently, Follows one step commands with increased time     Cueing Cueing Techniques: Verbal cues, Tactile cues     General Comments      Exercises Other Exercises Other Exercises: modified sit up on EOB x 3   Assessment/Plan    PT Assessment Patient needs continued PT services  PT Problem List Decreased strength;Decreased activity tolerance;Decreased balance;Decreased mobility;Decreased knowledge of use of DME;Decreased safety awareness;Decreased knowledge of precautions;Decreased cognition;Decreased coordination       PT Treatment Interventions DME instruction;Gait training;Functional mobility training;Therapeutic activities;Therapeutic exercise;Balance training;Patient/family education;Neuromuscular re-education    PT Goals (Current goals can be found in the Care Plan section)  Acute Rehab PT Goals Patient Stated Goal: did not state PT Goal Formulation: Patient unable to participate in goal setting Time For Goal Achievement: 02/21/24 Potential to Achieve Goals: Fair    Frequency Min 2X/week     Co-evaluation PT/OT/SLP Co-Evaluation/Treatment: Yes Reason for Co-Treatment: Necessary to address cognition/behavior during functional activity;For patient/therapist safety;To address functional/ADL transfers;Complexity of the patient's impairments (multi-system involvement) PT goals addressed during session: Mobility/safety with mobility;Balance         AM-PAC PT "6 Clicks" Mobility  Outcome Measure Help needed turning from your back to your side while in a flat bed without using bedrails?: Total Help needed moving from lying on your back to sitting on the side of a flat bed without using bedrails?: Total Help  needed moving to and from a bed to a chair (including a wheelchair)?: Total Help needed standing up from a chair using your arms (e.g., wheelchair or bedside chair)?: Total Help needed to walk in hospital room?: Total Help needed climbing 3-5 steps with a railing? : Total 6 Click Score: 6    End of Session Equipment Utilized During Treatment: Oxygen Activity Tolerance: Patient tolerated treatment well Patient left: in bed;with call bell/phone within reach Nurse Communication: Mobility status PT Visit Diagnosis: Unsteadiness on feet (R26.81);Muscle weakness (generalized) (M62.81);Other abnormalities of gait and mobility (R26.89)    Time: 1610-9604 PT Time Calculation (min) (ACUTE ONLY): 20 min   Charges:   PT Evaluation $PT Eval High Complexity: 1 High   PT General Charges $$ ACUTE PT VISIT: 1 Visit         Janine Melbourne, PT, DPT Physical Therapist - [facility]  [facility]   Xolani Degracia A Ragnar Waas 02/07/2024, 3:28 PM

## 2024-02-07 NOTE — Progress Notes (Addendum)
 PHARMACY CONSULT NOTE - ELECTROLYTES  Pharmacy Consult for Electrolyte Monitoring and Replacement   Recent Labs: Height: 5' 4.02" (162.6 cm) Weight: 81.1 kg (178 lb 12.7 oz) IBW/kg (Calculated) : 54.74 Estimated Creatinine Clearance: 34.5 mL/min (A) (by C-G formula based on SCr of 1.32 mg/dL (H)). Potassium (mmol/L)  Date Value  02/07/2024 3.2 (L)   Magnesium  (mg/dL)  Date Value  13/04/6577 2.3   Calcium (mg/dL)  Date Value  46/96/2952 10.5 (H)   Albumin (g/dL)  Date Value  84/13/2440 3.7   Phosphorus (mg/dL)  Date Value  07/20/2535 6.1 (H)   Sodium (mmol/L)  Date Value  02/07/2024 134 (L)   Assessment  Tanya Frye is a 83 y.o. female presenting with septic shock 2/2 CAP. PMH significant for COPD, CHF,  hypertension, T2DM. Pharmacy has been consulted to monitor and replace electrolytes. On amio and milrinone . Scr starting to trend up. Phos is elevated and trending down.   diuretics: furosemide  160 mg IV BID, acetazolamide  500 mg po BID, spironolactone  25 mg po daily  Goal of Therapy:  potassium 4.0 - 5.1 mmol/L magnesium  2.0 - 2.4 mg/dL All Other Electrolytes WNL  Plan:  Will give Kcl 40 mEq daily while on lasix  IV.  F/u with AM labs.   Thank you for allowing pharmacy to be a part of this patient's care.  Tanya Frye 02/07/2024 6:56 AM

## 2024-02-07 NOTE — Progress Notes (Signed)
 Palliative Care Progress Note, Assessment & Plan   Patient Name: Tanya Frye       Date: 02/07/2024 DOB: May 14, 1941  Age: 83 y.o. MRN#: [id number] Attending Physician: Alphonsus Jeans, MD Primary Care Physician: Pcp, No Admit Date: 01/28/2024  Subjective: Unable to assess  HPI: 83 y.o. female  with past medical history of COPD, CHF, HTN, type 2 diabetes, and HOH admitted on 01/28/2024 with acute respiratory distress requiring BiPAP.   Upon arrival to ED, patient found to be in A-fib with RVR and was given amiodarone .  Additionally, patient developed sepsis and cardiogenic shock requiring pressor support.  PCCM was consulted and later that evening patient required intubation.   Patient remains critically ill with multiorgan failure supported by 1 vasopressor. Leukocytosis has improved from 18.7-> 11.7 in the past 24 hours. LVEF on echo 25-30% with global hypokinesis.   PMT was consulted to support patient and family with goals of care discussions.    Summary of counseling/coordination of care: Extensive chart review completed prior to meeting patient including labs, vital signs, imaging, progress notes, orders, and available advanced directive documents from current and previous encounters.   After reviewing the patient's chart I assessed patient at bedside. RN and cardiology at bedside. No family present.   Elderly, ill-appearing female lying in bed. She is alert but does not answer questions or follow commands. Even, unlabored respirations on O2 via [place]. She is in no distress.   Remains critically ill with multiorgan failure and poor prognosis. Family made decision to change to DNR and extubate to [facility] 5/16. Today, pt was transitioned from HHFNC to 5L O2 via [place].  Per cardiology, hold diuresis, wean  off milrinone , transition to PO amiodarone . Not currently a candidate for aggressive cardiovascular care.   PMT to follow up with family tomorrow at bedside to discuss further goals of care.     Physical Exam Vitals reviewed.  Constitutional:      General: She is not in acute distress.    Appearance: She is ill-appearing.  HENT:     Head: Normocephalic and atraumatic.     Nose:     Comments: O2 via [place] [facility]:     Effort: Pulmonary effort is normal. No respiratory distress.  Abdominal:     General: There is no distension.     Palpations: Abdomen is soft.     Tenderness: There is no abdominal tenderness. There is no guarding.  Musculoskeletal:     Right lower leg: No edema.     Left lower leg: No edema.  Skin:    General: Skin is warm and dry.  Neurological:     Mental Status: She is alert.    Recommendations/Plan: Continue DNR/DNI status  Continue current supportive interventions PMT to continue to follow for GOC discussion        Total Time 35 minutes   Discussed plan of care with primary RN.  Time spent includes: Detailed review of medical records (labs, imaging, vital signs), medically appropriate exam (mental status, respiratory, cardiac, skin), discussed with treatment team, counseling and educating patient, family and staff, documenting clinical information, medication management and coordination of care.     Ina Manas, AGNP- [facility]  Palliative Medicine Team  02/07/2024 9:10 AM  Office [phone]  Pager [phone]

## 2024-02-07 NOTE — Evaluation (Signed)
 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   CHF (congestive heart failure) (HCC)    Diabetes mellitus without complication (HCC)    HOH (hard of hearing)    extremely HOH   Hypertension    Palpitations    occasional related to meds/heart races   Past Surgical History:  Past Surgical History:  Procedure Laterality Date   ABDOMINAL HYSTERECTOMY  1990   CATARACT EXTRACTION W/PHACO Right 12/20/2020   Procedure: CATARACT EXTRACTION PHACO AND INTRAOCULAR LENS PLACEMENT (IOC) RIGHT DIABETIC 8.13 01:17.6 10.5%;  Surgeon: Annell Kidney, MD;  Location: [facility];  Service: Ophthalmology;  Laterality: Right;   CATARACT EXTRACTION W/PHACO Left 01/03/2021   Procedure: CATARACT EXTRACTION PHACO AND INTRAOCULAR LENS PLACEMENT (IOC) LEFT DIABETIC  7.37 01:15.6 9.8%;  Surgeon: Annell Kidney, MD;  Location: [facility];  Service: Ophthalmology;  Laterality: Left;   CHOLECYSTECTOMY     DILATION AND CURETTAGE OF UTERUS  before 1990   X2   NECK SURGERY  03/30/2008   Per family, pt broke her neck   STOMACH SURGERY     tumor removed, per family   HPI:  83 y.o. female with history of obesity, DM II, HTN, HOH, hypothyroidism, gastric cancer in remission. Admitted with profound mixed septic/cardiogenic shock, acute respiratory failure and Afib with RVR. CXR 5/16 "No significant interval change.  2. Persistent bibasilar collapse/consolidation with probable  bilateral pleural effusions."    Assessment / Plan / Recommendation  Clinical Impression  Pt seen for bedside swallow assessment s/p extubation (5/16). Now, pt is on 6L nasal canula with O2 saturations  between 98-100. Pt alert, with hoarse vocal quality (though with increased volume with verbal cues) and min reduced cough. Pt is edentulous- dentures not present in room (daughter in law reporting they are at home). Oral motor function grossly WFL. Pt seen with trials of ice, thin liquids, and puree solids. No overt or subtle s/sx pharyngeal dysphagia noted. No change to vocal quality across trials. Vitals stable for duration of trials. Oral phase mildly prolonged, though pt able to clear oral cavity independently.   Recommend Dys 1 (puree) and thin liquids. Small sips/slow rate, monitor straw use. Medications whole vs crushed in puree. Monitor alertness t/o intake- pt to benefit from small meals/snacks to aid endurance for intake. Family reporting restricted PO intake at baseline- further dietician intervention recommended.   Pt is at increased risk for aspiration in the setting of deconditioning, limited endurance, recent intubation, reduced respiratory status, and age. However, with strict application of aspiration precautions and assistance/monitoring for PO intake, risk can be managed. MD and RN aware of recommendations. SLP will continue to follow.   SLP Visit Diagnosis: Dysphagia, oropharyngeal phase (R13.12)    Aspiration Risk  Moderate aspiration risk    Diet Recommendation   Thin;Dysphagia 1 (puree)  Medication Administration: Crushed with puree (vs whole)    Other  Recommendations Oral Care Recommendations: Oral care before and after PO;Oral care BID;Staff/trained caregiver to provide oral care    Recommendations for follow up therapy are one component of a multi-disciplinary discharge planning process, led by the attending physician.  Recommendations may be updated based on patient status, additional  functional criteria and insurance authorization.  Follow up Recommendations Follow physician's recommendations for discharge plan and follow up therapies      Assistance Recommended at  Discharge    Functional Status Assessment Patient has had a recent decline in their functional status and demonstrates the ability to make significant improvements in function in a reasonable and predictable amount of time.  Frequency and Duration min 2x/week  2 weeks       Prognosis Prognosis for improved oropharyngeal function: Fair Barriers to Reach Goals:  (reduced appetite, motivation, endurance)      Swallow Study   General Date of Onset: 02/07/24 HPI: 83 y.o. female with history of obesity, DM II, HTN, HOH, hypothyroidism, gastric cancer in remission. Admitted with profound mixed septic/cardiogenic shock, acute respiratory failure and Afib with RVR. CXR 5/16 "No significant interval change.  2. Persistent bibasilar collapse/consolidation with probable  bilateral pleural effusions." Type of Study: Bedside Swallow Evaluation Previous Swallow Assessment: none in chart Diet Prior to this Study: NPO Temperature Spikes Noted: No (WBC 15.1) Respiratory Status: Nasal cannula (6L) History of Recent Intubation: Yes Total duration of intubation (days): 9 days Date extubated: 02/06/24 Behavior/Cognition: Lethargic/Drowsy;Cooperative;Pleasant mood Oral Cavity Assessment: Within Functional Limits Oral Care Completed by SLP: Yes Oral Cavity - Dentition: Edentulous (dentures at home) Vision: Functional for self-feeding Self-Feeding Abilities: Needs assist;Needs set up Patient Positioning: Upright in bed Baseline Vocal Quality: Hoarse;Low vocal intensity (mild reduced) Volitional Cough: Weak Volitional Swallow: Able to elicit    Oral/Motor/Sensory Function Overall Oral Motor/Sensory Function: Within functional limits   Ice Chips Ice chips: Within functional limits Presentation: Spoon   Thin Liquid Thin Liquid: Within functional limits Presentation: Cup;Spoon;Straw    Nectar Thick Nectar Thick Liquid: Not tested   Honey Thick Honey Thick Liquid: Not tested   Puree Puree: Within  functional limits Presentation: Spoon   Solid     Solid: Not tested Other Comments: held secondary to low endurance and dentition     [place] Sayaka Hoeppner Clapp, MS, CCC-SLP Speech Language Pathologist Rehab Services; [facility] [phone] (ascom)   [name] 02/07/2024,9:31 AM

## 2024-02-07 NOTE — Progress Notes (Addendum)
 PROGRESS NOTE    Tanya Frye  WJX:[id number] DOB: [birth year] DOA: 01/28/2024 PCP: Pcp, No   Assessment & Plan:   Principal Problem:   Cardiogenic shock (HCC) Active Problems:   Acute respiratory failure (HCC)   Sepsis (HCC)   Dilated cardiomyopathy (HCC)   Non-STEMI (non-ST elevated myocardial infarction) (HCC)   Community acquired pneumonia   Atrial fibrillation with RVR (HCC)   Pressure injury of skin  Assessment and Plan:  Shock: cardiogenic & septic. See NP Keene's note on how pt met shock criteria.D/c milirinone drip as per cardio. Continue on aldactone  Continue on tele. Cardio following and recs apprec  Acute metabolic encephalopathy: continue w/ supportive care  Acute systolic CHF: decompensated. Continue on IV lasix . Monitor I/Os. Weaned off of pressors. Cardio following and recs apprec  HTN: holding all home anti-HTN meds  A. fib: w/ RVR. New onset. Continue on amio as cardio. Continue on tele.   Acute hypoxic respiratory failure: s/p intubation, ventilation & extubation. Continue on supplemental oxygen and wean as tolerated  Possible CAP: completed abx course. Continue on bronchodilators  B/l pleural effusions: continue on IV lasix    AKI: Cr is labile. Avoid nephrotoxic meds   Hypokalemia: potassium given  Metabolic alkalosis: secondary to diuresis. D/c diamox  as per cardio  DM2: well controlled, HbA1c 6.6. Continue on glargine, SSI w/ accucheks  Hypothyroidism: continue on levothryoxine  Obesity: BMI 30.6. Complicates overall care & prognosis     DVT prophylaxis: lovenox  Code Status: DNR/DNI  Family Communication:  Disposition Plan: unclear  Level of care: Stepdown Consultants:  ICU Cardio   Procedures:   Antimicrobials:    Subjective: Pt is lethargic   Objective: Vitals:   02/07/24 0400 02/07/24 0500 02/07/24 0600 02/07/24 0700  BP: 102/60 105/62 105/61 95/70  Pulse: (!) 102 95 97 (!) 102  Resp: 14 13 13 14   Temp:      TempSrc:       SpO2: 99% 100% 100% 100%  Weight:  81.1 kg    Height:        Intake/Output Summary (Last 24 hours) at 02/07/2024 0858 Last data filed at 02/07/2024 0700 Gross per 24 hour  Intake 1463.15 ml  Output 4075 ml  Net -2611.85 ml   Filed Weights   02/05/24 0600 02/06/24 0500 02/07/24 0500  Weight: 87.2 kg 79.1 kg 81.1 kg    Examination:  General exam: Appears uncomfortable  Respiratory system: decreased breath sounds b/l Cardiovascular system: S1 & S2 +. No rubs, gallops or clicks.  Gastrointestinal system: Abdomen is obese, soft and nontender. Hypoactive bowel sounds heard. Central nervous system: lethargic Psychiatry: Judgement and insight appears not at baseline     Data Reviewed: I have personally reviewed following labs and imaging studies  CBC: Recent Labs  Lab 02/03/24 0441 02/04/24 0356 02/05/24 0314 02/06/24 0412 02/07/24 0543  WBC 12.7* 16.2* 14.0* 17.2* 15.1*  HGB 10.4* 11.0* 10.7* 11.5* 11.6*  HCT 32.9* 35.1* 34.5* 37.2 36.7  MCV 86.1 85.4 86.7 85.7 84.8  PLT 192 261 262 360 331   Basic Metabolic Panel: Recent Labs  Lab 02/04/24 0356 02/04/24 1558 02/05/24 0314 02/06/24 0409 02/06/24 0412 02/06/24 1228 02/07/24 0543  NA 137 136 135 133*  --  132* 134*  K 3.1* 3.8 4.1 3.1*  --  4.6 3.2*  CL 96* 95* 90* 82*  --  84* 87*  CO2 28 29 34* 34*  --  33* 33*  GLUCOSE 188* 154* 192* 262*  --  191* 173*  BUN 54* 60* 69* 74*  --  79* 89*  CREATININE 1.04* 1.14* 1.18* 1.25*  --  1.22* 1.32*  CALCIUM 8.6* 9.2 10.3 11.0*  --  11.1* 10.5*  MG 2.0 1.8 2.2  --  1.8  --  2.3  PHOS 4.3 4.5 6.4* 7.8*  --  6.6* 6.1*   GFR: Estimated Creatinine Clearance: 34.5 mL/min (A) (by C-G formula based on SCr of 1.32 mg/dL (H)). Liver Function Tests: Recent Labs  Lab 02/04/24 1558 02/05/24 0314 02/06/24 0409 02/06/24 1228 02/07/24 0543  ALBUMIN 3.0* 3.2* 3.6 3.8 3.7   No results for input(s): "LIPASE", "AMYLASE" in the last 168 hours. No results for input(s):  "AMMONIA" in the last 168 hours. Coagulation Profile: No results for input(s): "INR", "PROTIME" in the last 168 hours. Cardiac Enzymes: No results for input(s): "CKTOTAL", "CKMB", "CKMBINDEX", "TROPONINI" in the last 168 hours. BNP (last 3 results) No results for input(s): "PROBNP" in the last 8760 hours. HbA1C: No results for input(s): "HGBA1C" in the last 72 hours. CBG: Recent Labs  Lab 02/06/24 1616 02/06/24 1953 02/06/24 2346 02/07/24 0435 02/07/24 0736  GLUCAP 213* 233* 210* 173* 166*   Lipid Profile: Recent Labs    02/05/24 0314  TRIG 193*   Thyroid  Function Tests: No results for input(s): "TSH", "T4TOTAL", "FREET4", "T3FREE", "THYROIDAB" in the last 72 hours. Anemia Panel: No results for input(s): "VITAMINB12", "FOLATE", "FERRITIN", "TIBC", "IRON", "RETICCTPCT" in the last 72 hours. Sepsis Labs: Recent Labs  Lab 01/31/24 0901 02/03/24 0441 02/04/24 0356  LATICACIDVEN 1.5 1.0 1.3    Recent Results (from the past 240 hours)  Culture, blood (Routine x 2)     Status: None   Collection Time: 01/28/24  9:14 AM   Specimen: BLOOD  Result Value Ref Range Status   Specimen Description BLOOD LEFT ANTECUBITAL  Final   Special Requests   Final    BOTTLES DRAWN AEROBIC AND ANAEROBIC Blood Culture results may not be optimal due to an inadequate volume of blood received in culture bottles   Culture   Final    NO GROWTH 5 DAYS Performed at [facility], [address] [place], [place] [address]    Report Status 02/02/2024 FINAL  Final  Resp panel by RT-PCR (RSV, Flu A&B, Covid) Anterior Nasal Swab     Status: None   Collection Time: 01/28/24  9:14 AM   Specimen: Anterior Nasal Swab  Result Value Ref Range Status   SARS Coronavirus 2 by RT PCR NEGATIVE NEGATIVE Final    Comment: (NOTE) SARS-CoV-2 target nucleic acids are NOT DETECTED.  The SARS-CoV-2 RNA is generally detectable in upper respiratory specimens during the acute phase of infection. The  lowest concentration of SARS-CoV-2 viral copies this assay can detect is 138 copies/mL. A negative result does not preclude SARS-Cov-2 infection and should not be used as the sole basis for treatment or other patient management decisions. A negative result may occur with  improper specimen collection/handling, submission of specimen other than nasopharyngeal swab, presence of viral mutation(s) within the areas targeted by this assay, and inadequate number of viral copies(<138 copies/mL). A negative result must be combined with clinical observations, patient history, and epidemiological information. The expected result is Negative.  Fact Sheet for Patients:  BloggerCourse.com  Fact Sheet for Healthcare Providers:  SeriousBroker.it  This test is no t yet approved or cleared by the [place]  FDA and  has been authorized for detection and/or diagnosis of SARS-CoV-2 by FDA under an Emergency Use Authorization (  EUA). This EUA will remain  in effect (meaning this test can be used) for the duration of the COVID-19 declaration under Section 564(b)(1) of the Act, 21 U.S.C.section 360bbb-3(b)(1), unless the authorization is terminated  or revoked sooner.       Influenza A by PCR NEGATIVE NEGATIVE Final   Influenza B by PCR NEGATIVE NEGATIVE Final    Comment: (NOTE) The Xpert Xpress SARS-CoV-2/FLU/RSV plus assay is intended as an aid in the diagnosis of influenza from Nasopharyngeal swab specimens and should not be used as a sole basis for treatment. Nasal washings and aspirates are unacceptable for Xpert Xpress SARS-CoV-2/FLU/RSV testing.  Fact Sheet for Patients: BloggerCourse.com  Fact Sheet for Healthcare Providers: SeriousBroker.it  This test is not yet approved or cleared by the [place]  FDA and has been authorized for detection and/or diagnosis of SARS-CoV-2 by FDA under  an Emergency Use Authorization (EUA). This EUA will remain in effect (meaning this test can be used) for the duration of the COVID-19 declaration under Section 564(b)(1) of the Act, 21 U.S.C. section 360bbb-3(b)(1), unless the authorization is terminated or revoked.     Resp Syncytial Virus by PCR NEGATIVE NEGATIVE Final    Comment: (NOTE) Fact Sheet for Patients: BloggerCourse.com  Fact Sheet for Healthcare Providers: SeriousBroker.it  This test is not yet approved or cleared by the [place]  FDA and has been authorized for detection and/or diagnosis of SARS-CoV-2 by FDA under an Emergency Use Authorization (EUA). This EUA will remain in effect (meaning this test can be used) for the duration of the COVID-19 declaration under Section 564(b)(1) of the Act, 21 U.S.C. section 360bbb-3(b)(1), unless the authorization is terminated or revoked.  Performed at [facility], [address] [place], [place] [address]   Culture, blood (Routine x 2)     Status: None   Collection Time: 01/28/24 10:27 AM   Specimen: BLOOD  Result Value Ref Range Status   Specimen Description BLOOD BLOOD LEFT HAND  Final   Special Requests   Final    BOTTLES DRAWN AEROBIC AND ANAEROBIC Blood Culture adequate volume   Culture   Final    NO GROWTH 5 DAYS Performed at [facility], [address], [place], [place] [address]    Report Status 02/02/2024 FINAL  Final  MRSA Next Gen by PCR, Nasal     Status: None   Collection Time: 01/28/24  3:53 PM   Specimen: Nasal Mucosa; Nasal Swab  Result Value Ref Range Status   MRSA by PCR Next Gen NOT DETECTED NOT DETECTED Final    Comment: (NOTE) The GeneXpert MRSA Assay (FDA approved for NASAL specimens only), is one component of a comprehensive MRSA colonization surveillance program. It is not intended to diagnose MRSA infection nor to guide or monitor treatment for MRSA infections. Test  performance is not FDA approved in patients less than 16 years old. Performed at [facility], [address] [place], [place] [address]   Respiratory (~20 pathogens) panel by PCR     Status: None   Collection Time: 01/28/24  8:05 PM  Result Value Ref Range Status   Adenovirus NOT DETECTED NOT DETECTED Final   Coronavirus 229E NOT DETECTED NOT DETECTED Final    Comment: (NOTE) The Coronavirus on the Respiratory Panel, DOES NOT test for the novel  Coronavirus (2019 nCoV)    Coronavirus HKU1 NOT DETECTED NOT DETECTED Final   Coronavirus NL63 NOT DETECTED NOT DETECTED Final   Coronavirus OC43 NOT DETECTED NOT DETECTED  Final   Metapneumovirus NOT DETECTED NOT DETECTED Final   Rhinovirus / Enterovirus NOT DETECTED NOT DETECTED Final   Influenza A NOT DETECTED NOT DETECTED Final   Influenza B NOT DETECTED NOT DETECTED Final   Parainfluenza Virus 1 NOT DETECTED NOT DETECTED Final   Parainfluenza Virus 2 NOT DETECTED NOT DETECTED Final   Parainfluenza Virus 3 NOT DETECTED NOT DETECTED Final   Parainfluenza Virus 4 NOT DETECTED NOT DETECTED Final   Respiratory Syncytial Virus NOT DETECTED NOT DETECTED Final   Bordetella pertussis NOT DETECTED NOT DETECTED Final   Bordetella Parapertussis NOT DETECTED NOT DETECTED Final   Chlamydophila pneumoniae NOT DETECTED NOT DETECTED Final   Mycoplasma pneumoniae NOT DETECTED NOT DETECTED Final    Comment: Performed at [facility], [address] [place], [place] [address]         Radiology Studies: DG Chest Port 1 View Result Date: 02/06/2024 CLINICAL DATA:  Hypoxia. EXAM: PORTABLE CHEST 1 VIEW COMPARISON:  02/04/2024 FINDINGS: The cardio pericardial silhouette is enlarged. There is pulmonary vascular congestion without overt pulmonary edema. Bibasilar atelectasis or infiltrate is similar to prior with probable bilateral pleural effusions. Right subclavian central line tip overlies the distal SVC level. The NG tube passes into the  stomach although the distal tip position is not included on the film. Endotracheal tube tip is 2.4 cm above the base of the carina. Telemetry leads overlie the chest. IMPRESSION: 1. No significant interval change. 2. Persistent bibasilar collapse/consolidation with probable bilateral pleural effusions. Electronically Signed   By: Donnal Fusi M.D.   On: 02/06/2024 07:16        Scheduled Meds:  acetaZOLAMIDE   500 mg Per Tube BID   aspirin   81 mg Per Tube Daily   Chlorhexidine  Gluconate Cloth  6 each Topical Daily   enoxaparin  (LOVENOX ) injection  1 mg/kg Subcutaneous BID   insulin  aspart  0-20 Units Subcutaneous Q4H   insulin  glargine-yfgn  10 Units Subcutaneous BID   levothyroxine   75 mcg Per Tube Q0600   pantoprazole  (PROTONIX ) IV  40 mg Intravenous Daily   potassium chloride   40 mEq Per Tube Once   potassium chloride   40 mEq Oral Daily   QUEtiapine   25 mg Per Tube BID   sodium chloride  flush  10-40 mL Intracatheter Q12H   spironolactone   25 mg Per Tube Daily   Continuous Infusions:  amiodarone  30 mg/hr (02/07/24 0700)   dextrose      furosemide  160 mg (02/07/24 0816)   milrinone  0.25 mcg/kg/min (02/07/24 0700)   norepinephrine  (LEVOPHED ) Adult infusion Stopped (02/06/24 1045)     LOS: 10 days       Alphonsus Jeans, MD [facility] Pager [phone]  If 7PM-7AM, please contact night-coverage [email or web address] 02/07/2024, 8:58 AM

## 2024-02-07 NOTE — Plan of Care (Signed)
  Problem: Education: Goal: Knowledge of General Education information will improve Description: Including pain rating scale, medication(s)/side effects and non-pharmacologic comfort measures Outcome: Progressing   Problem: Clinical Measurements: Goal: Ability to maintain clinical measurements within normal limits will improve Outcome: Progressing Goal: Will remain free from infection Outcome: Not Progressing Goal: Diagnostic test results will improve Outcome: Progressing Goal: Respiratory complications will improve Outcome: Progressing Goal: Cardiovascular complication will be avoided Outcome: Progressing

## 2024-02-07 NOTE — Progress Notes (Signed)
 Cardiology Progress Note  Patient ID: Tanya Frye MRN: [id number] DOB: 1941/05/30 Date of Encounter: 02/07/2024 Primary Cardiologist: Belva Boyden, MD  Subjective   Chief Complaint: None.   HPI: Awake, not following commands.  ROS:  All other ROS reviewed and negative. Pertinent positives noted in the HPI.     Telemetry  Overnight telemetry shows A-fib heart rate 100s, which I personally reviewed.   ECG  The most recent ECG shows A-fib heart rate 103, anteroseptal infarct, which I personally reviewed.   Physical Exam   Vitals:   02/07/24 0900 02/07/24 0930 02/07/24 1000 02/07/24 1100  BP: 124/67  (!) 92/57 105/69  Pulse: 100 (!) 113 (!) 104 (!) 113  Resp: (!) 23 17 20 16   Temp:      TempSrc:      SpO2: 100% 100% 100% 100%  Weight:      Height:        Intake/Output Summary (Last 24 hours) at 02/07/2024 1350 Last data filed at 02/07/2024 1242 Gross per 24 hour  Intake 771.28 ml  Output 3775 ml  Net -3003.72 ml       02/07/2024    5:00 AM 02/06/2024    5:00 AM 02/05/2024    6:00 AM  Last 3 Weights  Weight (lbs) 178 lb 12.7 oz 174 lb 6.1 oz 192 lb 3.9 oz  Weight (kg) 81.1 kg 79.1 kg 87.2 kg    Body mass index is 30.67 kg/m.  General: Well nourished, well developed, in no acute distress Head: Atraumatic, normal size  Eyes: PEERLA, EOMI  Neck: Supple, no JVD Endocrine: No thryomegaly Cardiac: Normal S1, S2; RRR; no murmurs, rubs, or gallops Lungs: Clear to auscultation bilaterally, no wheezing, rhonchi or rales  Abd: Soft, nontender, no hepatomegaly  Ext: No edema, pulses 2+ Musculoskeletal: No deformities, BUE and BLE strength normal and equal Skin: Warm and dry, no rashes   Neuro: Alert and oriented to person, place, time, and situation, CNII-XII grossly intact, no focal deficits  Psych: Normal mood and affect   Cardiac Studies  TTE 01/29/2024  1. Left ventricular ejection fraction, by estimation, is 25 to 30%. The  left ventricle has severely decreased  function. The left ventricle  demonstrates global hypokinesis with severe hypokinesis of teh basal to  mid anterior/anteroseptal wall (apical  region not well visualized). The left ventricular internal cavity size was  mildly dilated. Left ventricular diastolic parameters are indeterminate.   2. Right ventricular systolic function is normal. The right ventricular  size is normal. There is normal pulmonary artery systolic pressure. The  estimated right ventricular systolic pressure is 34.4 mmHg.   3. The mitral valve is normal in structure. Mild to moderate mitral valve  regurgitation. No evidence of mitral stenosis. Moderate mitral annular  calcification.   4. The aortic valve is normal in structure. There is moderate  calcification of the aortic valve. Aortic valve regurgitation is not  visualized. Aortic valve sclerosis/calcification is present, unable to  exclude stenosis (gradient not measured.   5. The inferior vena cava is normal in size with greater than 50%  respiratory variability, suggesting right atrial pressure of 3 mmHg.   Patient Profile  Tanya Frye is a 83 y.o. female with COPD, hypertension, diabetes admitted on 01/28/2024 with acute hypoxic respiratory failure secondary to pneumonia.  Course complicated by mixed septic/cardiogenic shock.  Also with new onset atrial fibrillation with RVR.  Assessment & Plan   # Acute systolic heart failure, EF 25-30% # Suspect  stress-induced cardiomyopathy # Cardiogenic shock - Vitals are stable.  Good diuresis.  CVP roughly 10.  Potassium a bit low.  Hold further diuresis until potassium has been replaced.  We will go ahead and stop her milrinone .  She is elderly with advanced comorbidities and I am uncertain if she is a good candidate for aggressive cardiovascular care.  We will see how she does. - Echo either consistent with large LAD infarction versus stress-induced pattern.  Troponins are not that elevated to suggest large LAD infarct.   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 - Hold diuresis today.  Wean off milrinone .  We can see how she does today.  Ongoing goals of care discussion with family per palliative care team. - Okay to continue Aldactone  25 mg daily.  No beta-blocker.  # Persistent atrial fibrillation - Stop amiodarone  drip.  Transition to amiodarone  200 mg twice daily.  We can try to rate control her with this.  Could consider TEE/cardioversion closer to discharge but she currently appears quite frail and with ongoing family discussions aggressive cardiovascular care may not be indicated. - Currently on Lovenox .  Can transition to DOAC as we are able.  # Sepsis # Pneumonia - Has completed treatment for pneumonia.  # AKI - resolved.       For questions or updates, please contact [facility] Please consult [email or web address] for contact info under      Signed, Melodee Spruce T. Rolm Clos, MD, [facility]  [facility]  02/07/2024 1:50 PM

## 2024-02-07 NOTE — Evaluation (Signed)
 Occupational Therapy Evaluation Patient Details Name: Tanya Frye MRN: [id number] DOB: 30-Oct-1940 Today's Date: 02/07/2024   History of Present Illness   83 y/o female presented to ED on 01/28/24 for SOB. Intubated 5/7-5/16 (1 way extubation). Admitted with septic shock with progressive multi organ failure. PMH: COPD, CHF, HTN, T2DM   Clinical Impressions Pt was seen for OT evaluation and co-tx with PT this date. Prior to hospital admission, pt was independent in all aspects. She lives with her grandson. Pt presents to acute OT demonstrating impaired ADL performance and functional mobility 2/2 profound weakness, impaired balance, and activity tolerance (See OT problem list for additional functional deficits). Pt currently requires TOTAL +2 for all aspects of bed mobility, MAX A improving briefly to CGA-MIN A for static sitting balance but fatigues very quickly with posterior LOB. TOTAL A for LB ADL, MAX A for UB bathing/dressing, TOTAL A toileting, and some assist for grooming and self feeding. Pt would benefit from skilled OT services to address noted impairments and functional limitations (see below for any additional details) in order to maximize safety and independence while minimizing falls risk and caregiver burden. Anticipate the need for follow up OT services upon acute hospital DC.    If plan is discharge home, recommend the following:   Two people to help with walking and/or transfers;Two people to help with bathing/dressing/bathroom;Assistance with cooking/housework;Assistance with feeding;Help with stairs or ramp for entrance;Assist for transportation;Direct supervision/assist for financial management;Direct supervision/assist for medications management;Supervision due to cognitive status    Functional Status Assessment   Patient has had a recent decline in their functional status and demonstrates the ability to make significant improvements in function in a reasonable and predictable  amount of time.     Equipment Recommendations   Other (comment) (defer)      Precautions/Restrictions   Precautions Precautions: Fall Recall of Precautions/Restrictions: Impaired Restrictions Weight Bearing Restrictions Per Provider Order: No     Mobility Bed Mobility Overal bed mobility: Needs Assistance Bed Mobility: Supine to Sit, Sit to Supine     Supine to sit: Total assist, +2 for physical assistance, +2 for safety/equipment Sit to supine: Total assist, +2 for physical assistance, +2 for safety/equipment   General bed mobility comments: slight initiation to bring LLE towards EOB, otherwise totalA+2    Transfers    General transfer comment: deferred due to poor sitting balance      Balance Overall balance assessment: Needs assistance Sitting-balance support: Bilateral upper extremity supported, Feet unsupported Sitting balance-Leahy Scale: Poor Sitting balance - Comments: required up to maxA to maintain sitting balance Postural control: Posterior lean      ADL either performed or assessed with clinical judgement   ADL Overall ADL's : Needs assistance/impaired    General ADL Comments: Pt currently requires TOTAL A for LB ADL, MAX A for UB ADL, and MOD A for self feeding, setup supports for holding cup with B hands to drink.      Pertinent Vitals/Pain Pain Assessment Pain Assessment: Faces Faces Pain Scale: No hurt     Extremity/Trunk Assessment Upper Extremity Assessment Upper Extremity Assessment: Generalized weakness   Lower Extremity Assessment Lower Extremity Assessment: Generalized weakness   Cervical / Trunk Assessment Cervical / Trunk Assessment: Kyphotic   Communication Communication Communication: Impaired Factors Affecting Communication: Hearing impaired   Cognition Arousal: Alert Behavior During Therapy: Flat affect Cognition: Difficult to assess Difficult to assess due to: Impaired communication      Following commands:  Impaired Following commands impaired: Follows  one step commands inconsistently, Follows one step commands with increased time     Cueing  General Comments   Cueing Techniques: Verbal cues;Tactile cues              Home Living Family/patient expects to be discharged to:: Private residence Living Arrangements: Other (Comment) (grandson) Available Help at Discharge: Family Type of Home: House      Additional Comments: unsure of home setup due to patient's cognition      Prior Functioning/Environment Prior Level of Function : Independent/Modified Independent;Driving      Mobility Comments: per patient, ambulatory with no AD and driving      OT Problem List: Decreased strength;Decreased coordination;Decreased cognition;Decreased activity tolerance;Impaired balance (sitting and/or standing);Decreased knowledge of use of DME or AE;Obesity;Impaired UE functional use   OT Treatment/Interventions: Self-care/ADL training;Therapeutic exercise;Therapeutic activities;DME and/or AE instruction;Patient/family education;Energy conservation;Balance training      OT Goals(Current goals can be found in the care plan section)   Acute Rehab OT Goals Patient Stated Goal: get stronger OT Goal Formulation: With patient Time For Goal Achievement: 02/21/24 Potential to Achieve Goals: Good ADL Goals Pt Will Perform Eating: sitting;with set-up (supported sitting) Pt Will Perform Grooming: sitting;with set-up;with contact guard assist (seated without back support, without LOB) Pt Will Transfer to Toilet: with max assist;with +2 assist;stand pivot transfer;bedside commode (LRAD)   OT Frequency:  Min 2X/week    Co-evaluation PT/OT/SLP Co-Evaluation/Treatment: Yes Reason for Co-Treatment: Necessary to address cognition/behavior during functional activity;For patient/therapist safety;To address functional/ADL transfers;Complexity of the patient's impairments (multi-system involvement) PT  goals addressed during session: Mobility/safety with mobility;Balance OT goals addressed during session: ADL's and self-care      AM-PAC OT "6 Clicks" Daily Activity     Outcome Measure Help from another person eating meals?: A Lot Help from another person taking care of personal grooming?: A Lot Help from another person toileting, which includes using toliet, bedpan, or urinal?: Total Help from another person bathing (including washing, rinsing, drying)?: Total Help from another person to put on and taking off regular upper body clothing?: Total Help from another person to put on and taking off regular lower body clothing?: Total 6 Click Score: 8   End of Session Equipment Utilized During Treatment: Oxygen Nurse Communication: Mobility status  Activity Tolerance: Patient tolerated treatment well Patient left: in bed;with call bell/phone within reach;with bed alarm set  OT Visit Diagnosis: Other abnormalities of gait and mobility (R26.89);Muscle weakness (generalized) (M62.81)                Time: 0865-7846 OT Time Calculation (min): 21 min Charges:  OT General Charges $OT Visit: 1 Visit OT Evaluation $OT Eval Moderate Complexity: 1 Mod  Berenda Breaker., MPH, MS, OTR/L ascom [phone] 02/07/24, 4:26 PM

## 2024-02-08 DIAGNOSIS — I4891 Unspecified atrial fibrillation: Secondary | ICD-10-CM | POA: Diagnosis not present

## 2024-02-08 DIAGNOSIS — R570 Cardiogenic shock: Secondary | ICD-10-CM | POA: Diagnosis not present

## 2024-02-08 DIAGNOSIS — Z7189 Other specified counseling: Secondary | ICD-10-CM | POA: Diagnosis not present

## 2024-02-08 DIAGNOSIS — J9600 Acute respiratory failure, unspecified whether with hypoxia or hypercapnia: Secondary | ICD-10-CM | POA: Diagnosis not present

## 2024-02-08 DIAGNOSIS — Z515 Encounter for palliative care: Secondary | ICD-10-CM | POA: Diagnosis not present

## 2024-02-08 LAB — CBC
HCT: 36.2 % (ref 36.0–46.0)
Hemoglobin: 11.5 g/dL — ABNORMAL LOW (ref 12.0–15.0)
MCH: 26.9 pg (ref 26.0–34.0)
MCHC: 31.8 g/dL (ref 30.0–36.0)
MCV: 84.6 fL (ref 80.0–100.0)
Platelets: 339 10*3/uL (ref 150–400)
RBC: 4.28 MIL/uL (ref 3.87–5.11)
RDW: 16.6 % — ABNORMAL HIGH (ref 11.5–15.5)
WBC: 14.9 10*3/uL — ABNORMAL HIGH (ref 4.0–10.5)
nRBC: 0 % (ref 0.0–0.2)

## 2024-02-08 LAB — GLUCOSE, CAPILLARY
Glucose-Capillary: 123 mg/dL — ABNORMAL HIGH (ref 70–99)
Glucose-Capillary: 115 mg/dL — ABNORMAL HIGH (ref 70–99)
Glucose-Capillary: 206 mg/dL — ABNORMAL HIGH (ref 70–99)
Glucose-Capillary: 168 mg/dL — ABNORMAL HIGH (ref 70–99)
Glucose-Capillary: 167 mg/dL — ABNORMAL HIGH (ref 70–99)
Glucose-Capillary: 136 mg/dL — ABNORMAL HIGH (ref 70–99)

## 2024-02-08 LAB — BASIC METABOLIC PANEL WITH GFR
Anion gap: 12 (ref 5–15)
BUN: 79 mg/dL — ABNORMAL HIGH (ref 8–23)
CO2: 31 mmol/L (ref 22–32)
Calcium: 10.2 mg/dL (ref 8.9–10.3)
Chloride: 92 mmol/L — ABNORMAL LOW (ref 98–111)
Creatinine, Ser: 1.24 mg/dL — ABNORMAL HIGH (ref 0.44–1.00)
GFR, Estimated: 44 mL/min — ABNORMAL LOW (ref >60)
Glucose, Bld: 106 mg/dL — ABNORMAL HIGH (ref 70–99)
Potassium: 3.2 mmol/L — ABNORMAL LOW (ref 3.5–5.1)
Sodium: 135 mmol/L (ref 135–145)

## 2024-02-08 LAB — MAGNESIUM: Magnesium: 2.1 mg/dL (ref 1.7–2.4)

## 2024-02-08 MED ORDER — APIXABAN 5 MG PO TABS
5.0000 mg | ORAL_TABLET | Freq: Two times a day (BID) | ORAL | Status: DC
  Administered 2024-02-08 – 2024-02-13 (×10): 5 mg via ORAL
  Filled 2024-02-08 (×11): qty 1

## 2024-02-08 MED ORDER — POLYETHYLENE GLYCOL 3350 17 G PO PACK: 17.0000 g | PACK | Freq: Every day | ORAL | Status: DC | PRN

## 2024-02-08 MED ORDER — LOSARTAN POTASSIUM 25 MG PO TABS
12.5000 mg | ORAL_TABLET | Freq: Every day | ORAL | Status: DC
  Administered 2024-02-08 – 2024-02-13 (×6): 12.5 mg via ORAL
  Filled 2024-02-08 (×2): qty 0.5
  Filled 2024-02-08 (×4): qty 1
  Filled 2024-02-08: qty 0.5

## 2024-02-08 MED ORDER — POTASSIUM CHLORIDE 20 MEQ PO PACK
40.0000 meq | PACK | Freq: Once | ORAL | Status: AC
  Administered 2024-02-08: 40 meq via ORAL
  Filled 2024-02-08: qty 2

## 2024-02-08 MED ORDER — DOCUSATE SODIUM 100 MG PO CAPS: 100.0000 mg | ORAL_CAPSULE | Freq: Two times a day (BID) | ORAL | Status: DC | PRN

## 2024-02-08 MED ORDER — PANTOPRAZOLE SODIUM 40 MG PO TBEC
40.0000 mg | DELAYED_RELEASE_TABLET | Freq: Every day | ORAL | Status: DC
  Administered 2024-02-08 – 2024-02-13 (×6): 40 mg via ORAL
  Filled 2024-02-08 (×6): qty 1

## 2024-02-08 NOTE — Plan of Care (Signed)
  Problem: Clinical Measurements: Goal: Ability to maintain clinical measurements within normal limits will improve Outcome: Progressing Goal: Will remain free from infection Outcome: Progressing Goal: Diagnostic test results will improve Outcome: Progressing Goal: Respiratory complications will improve Outcome: Progressing Goal: Cardiovascular complication will be avoided Outcome: Progressing   Problem: Activity: Goal: Risk for activity intolerance will decrease Outcome: Progressing   Problem: Nutrition: Goal: Adequate nutrition will be maintained Outcome: Progressing   Problem: Elimination: Goal: Will not experience complications related to urinary retention Outcome: Progressing

## 2024-02-08 NOTE — Consult Note (Signed)
 PHARMACY - ANTICOAGULATION CONSULT NOTE  Pharmacy Consult for apixaban Indication: atrial fibrillation  Allergies  Allergen Reactions   Penicillins Rash    Patient Measurements: Height: 5' 4.02" (162.6 cm) Weight: 79.1 kg (174 lb 6.1 oz) IBW/kg (Calculated) : 54.74 HEPARIN  DW (KG): 74.7  Vital Signs: Temp: 97.8 F (36.6 C) (05/18 0200) Temp Source: Axillary (05/18 0200) BP: 114/69 (05/18 1100) Pulse Rate: 70 (05/18 1130)  Labs: Recent Labs    02/06/24 0412 02/06/24 1228 02/07/24 0543 02/08/24 0507  HGB 11.5*  --  11.6* 11.5*  HCT 37.2  --  36.7 36.2  PLT 360  --  331 339  CREATININE  --  1.22* 1.32* 1.24*    Estimated Creatinine Clearance: 36.2 mL/min (A) (by C-G formula based on SCr of 1.24 mg/dL (H)).   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   01/27/2024   levothyroxine  (SYNTHROID ) 75 MCG tablet Take 75 mcg by mouth daily before breakfast.   01/27/2024   aspirin  EC 81 MG tablet Take 1 tablet by mouth daily.   01/27/2024   Calcium Carbonate-Vit D-Min (CALCIUM 1200 PO) Take 1 tablet by mouth daily.   01/27/2024   loratadine  (CLARITIN ) 10 MG tablet TAKE 1 TABLET BY MOUTH EVERY DAY 90 tablet 1 01/27/2024   losartan  (COZAAR ) 50 MG tablet TAKE 1 TABLET BY MOUTH EVERY DAY 90 tablet 2 01/27/2024   lovastatin (MEVACOR) 10 MG tablet TAKE 1 TABLET ONCE A DAY ORALLY  0 01/27/2024   metFORMIN  (GLUCOPHAGE ) 1000 MG tablet TAKE 1 TABLET BY MOUTH TWICE A DAY 180 tablet 1 01/27/2024   metoprolol  succinate (TOPROL -XL) 50 MG 24 hr tablet TAKE 1 TABLET BY MOUTH DAILY. TAKE WITH  OR IMMEDIATELY FOLLOWING A MEAL. 90 tablet 0 01/27/2024   Omega-3 Fatty Acids (FISH OIL) 500 MG CAPS Take 1,000 mg by mouth daily.   01/27/2024   Scheduled:   amiodarone   200 mg Oral BID   apixaban  5 mg Oral BID   aspirin   81 mg Oral Daily   Chlorhexidine  Gluconate Cloth  6 each Topical Daily   insulin  aspart  0-20 Units Subcutaneous Q4H   insulin  glargine-yfgn  10 Units Subcutaneous BID   levothyroxine   75 mcg Oral Q0600   losartan   12.5 mg Oral Daily   pantoprazole   40 mg Oral Daily   QUEtiapine   25 mg Oral BID   sodium chloride  flush  10-40 mL Intracatheter Q12H   spironolactone   25 mg Oral Daily   Infusions:   dextrose      PRN: dextrose , dextrose , docusate sodium , mouth rinse, polyethylene glycol, sodium chloride  flush Anti-infectives (From admission, onward)    Start     Dose/Rate Route Frequency Ordered Stop   02/01/24 1500  cefTRIAXone  (ROCEPHIN ) 2 g in sodium chloride  0.9 % 100 mL IVPB        2 g 200 mL/hr over 30 Minutes Intravenous  Once 02/01/24 1407 02/01/24 1534   01/29/24 1800  ceFEPIme  (MAXIPIME ) 2 g in sodium chloride  0.9 % 100 mL  IVPB  Status:  Discontinued        2 g 200 mL/hr over 30 Minutes Intravenous Every 12 hours 01/29/24 1341 02/01/24 1407   01/29/24 1000  azithromycin  (ZITHROMAX ) 500 mg in sodium chloride  0.9 % 250 mL IVPB  Status:  Discontinued        500 mg 250 mL/hr over 60 Minutes Intravenous Every 24 hours 01/28/24 1343 01/31/24 1031   01/29/24 1000  cefTRIAXone  (ROCEPHIN ) 2 g in sodium chloride  0.9 % 100 mL IVPB  Status:  Discontinued        2 g 200 mL/hr over 30 Minutes Intravenous Every 24 hours 01/28/24 1343 01/29/24 1341   01/28/24 1000  cefTRIAXone  (ROCEPHIN ) 2 g in sodium chloride  0.9 % 100 mL IVPB        2 g 200 mL/hr over 30 Minutes Intravenous Once 01/28/24 0954 01/28/24 1045   01/28/24 1000  azithromycin  (ZITHROMAX ) 500 mg in sodium chloride  0.9 % 250 mL IVPB        500 mg 250 mL/hr over 60 Minutes Intravenous  Once 01/28/24 0954  01/28/24 1135       Assessment: Pharmacy consulted to switch enoxaparin  to apixaban.   Goal of Therapy:  Monitor platelets by anticoagulation protocol: Yes   Plan:  Apixaban 5 mg BID. Pt does not meet criteria for dose reduction.   Trinidad Funk, PharmD, BCPS 02/08/2024,12:22 PM

## 2024-02-08 NOTE — TOC Progression Note (Signed)
 Transition of Care [facility]) - Progression Note    Patient Details  Name: Tanya Frye MRN: [id number] Date of Birth: 1941/03/20  Transition of [facility]) CM/SW Contact  Deano Tomaszewski A Wilmoth Rasnic, RN Phone Number: 02/08/2024, 3:51 PM  Clinical Narrative:    Chart reviewed. I have spoken with Jillienne Egner patient's daughter-in-law.  I have informed her that PT/OT has recommended SNF on discharge.    I have explained the SNF process.  [name] would like for TOC to fax out to [facility], [facility], [facility] and [facility].    I have completed Fl2, submitted for [facility] and sent referral to [facility], [facility], [facility], and [place].    TOC will continue to follow for discharge planning.         Expected Discharge Plan and Services                                               Social Determinants of Health (SDOH) Interventions SDOH Screenings   Food Insecurity: Patient Unable To Answer (02/04/2024)  Housing: Patient Unable To Answer (01/29/2024)  Transportation Needs: Patient Unable To Answer (01/29/2024)  Utilities: Patient Unable To Answer (01/29/2024)  Alcohol Screen: Low Risk  (09/05/2021)  Depression (PHQ2-9): Low Risk  (09/05/2021)  Financial Resource Strain: Low Risk  (09/05/2021)  Physical Activity: Inactive (09/05/2021)  Social Connections: Patient Unable To Answer (01/29/2024)  Stress: No Stress Concern Present (09/05/2021)  Tobacco Use: Low Risk  (01/28/2024)    Readmission Risk Interventions     No data to display

## 2024-02-08 NOTE — Plan of Care (Signed)
  Problem: Education: Goal: Knowledge of General Education information will improve Description: Including pain rating scale, medication(s)/side effects and non-pharmacologic comfort measures Outcome: Progressing   Problem: Clinical Measurements: Goal: Ability to maintain clinical measurements within normal limits will improve Outcome: Progressing Goal: Diagnostic test results will improve Outcome: Progressing Goal: Respiratory complications will improve Outcome: Progressing Goal: Cardiovascular complication will be avoided Outcome: Progressing   Problem: Activity: Goal: Risk for activity intolerance will decrease Outcome: Progressing   Problem: Nutrition: Goal: Adequate nutrition will be maintained Outcome: Progressing   Problem: Coping: Goal: Level of anxiety will decrease Outcome: Progressing   Problem: Elimination: Goal: Will not experience complications related to bowel motility Outcome: Progressing Goal: Will not experience complications related to urinary retention Outcome: Progressing   Problem: Pain Managment: Goal: General experience of comfort will improve and/or be controlled Outcome: Progressing   Problem: Safety: Goal: Ability to remain free from injury will improve Outcome: Progressing

## 2024-02-08 NOTE — Progress Notes (Signed)
 Palliative Care Progress Note, Assessment & Plan   Patient Name: Tanya Frye       Date: 02/08/2024 DOB: 11-03-1940  Age: 83 y.o. MRN#: [id number] Attending Physician: Alphonsus Jeans, MD Primary Care Physician: Pcp, No Admit Date: 01/28/2024  Subjective: Pt sitting upright in bed. She shakes head yes when asked about pain but does not respond to location of pain. Daughter in law at bedside reports she is much more interactive and the best she has looked. Jodie hopes that she will move to the medical floor in a few days.   HPI: 83 y.o. female  with past medical history of COPD, CHF, HTN, type 2 diabetes, and HOH admitted on 01/28/2024 with acute respiratory distress requiring BiPAP.   Upon arrival to ED, patient found to be in A-fib with RVR and was given amiodarone .  Additionally, patient developed sepsis and cardiogenic shock requiring pressor support.  PCCM was consulted and later that evening patient required intubation.   Patient remains critically ill with multiorgan failure supported by 1 vasopressor. Leukocytosis has improved from 18.7-> 11.7 in the past 24 hours. LVEF on echo 25-30% with global hypokinesis.   PMT was consulted to support patient and family with goals of care discussions.    Summary of counseling/coordination of care: Extensive chart review completed prior to meeting patient including labs, vital signs, imaging, progress notes, orders, and available advanced directive documents from current and previous encounters.   After reviewing the patient's chart and assessing the patient at bedside, I spoke with patient and daughter in law in regards to symptom management and goals of care.   Ill-appearing, elderly female sitting upright in bed. Daughter in law is feeding her breakfast.  She is eating/drinking/swallowing w/o difficulty. Even, unlabored respirations w/o any oxygen support. She is in no distress. She is alert, but does not respond to most questions.   Jodie shares that she is "very impressed" with the progress that Tanya Frye has made. She is glad that she is off IV meds and may be moving to a medical bed in the next few days.  Plan is to continue the course with goal of rehab so patient may go back home.  Advised Jodie that PMT will step back but remain available anytime for questions or concerns. She is appreciative of the care that her mother has received in ICU.   Therapeutic silence and active listening provided for Jodie to share her thoughts and emotions regarding current medical situation.  Emotional support provided.  Physical Exam Vitals reviewed.  Constitutional:      General: She is not in acute distress.    Appearance: She is ill-appearing.  HENT:     Head: Normocephalic and atraumatic.     Mouth/Throat:     Mouth: Mucous membranes are moist.  Pulmonary:     Effort: Pulmonary effort is normal. No respiratory distress.  Musculoskeletal:     Right lower leg: No edema.     Left lower leg: No edema.  Skin:    General: Skin is warm and dry.  Neurological:     Mental Status: She is alert.     Motor: Weakness present.   Recommendations/Plan: Continue DNR/DNI status  Continue current supportive interventions PMT will step back, but remain available for questions or concerns           Total Time 25 minutes    Time spent includes: Detailed review of medical records (labs, imaging, vital signs), medically appropriate exam (mental status, respiratory, cardiac, skin), discussed with treatment team, counseling and educating patient, family and staff, documenting clinical information, medication management and coordination of care.     Ina Manas, Joyice Nodal- [facility] Palliative Medicine Team  02/08/2024 8:59 AM  Office [phone]  Pager  [phone]

## 2024-02-08 NOTE — Progress Notes (Signed)
 PROGRESS NOTE    Tanya Frye  WGN:[id number] DOB: 1940/10/28 DOA: 01/28/2024 PCP: Pcp, No   Assessment & Plan:   Principal Problem:   Cardiogenic shock (HCC) Active Problems:   Acute respiratory failure (HCC)   Sepsis (HCC)   Dilated cardiomyopathy (HCC)   Non-STEMI (non-ST elevated myocardial infarction) (HCC)   Community acquired pneumonia   Atrial fibrillation with RVR (HCC)   Pressure injury of skin  Assessment and Plan:  Shock: cardiogenic & septic. See NP Keene's note on how pt met shock criteria. D/c milirinone drip as per cardio. Continue on aldactone , losartan , & amio as per cardio. Continue on tele. Poor prognosis. Cardio following and recs apprec  Acute metabolic encephalopathy: continue w/ supportive care. Mental status unchanged from day prior.   Acute systolic CHF: decompensated. Holding lasix . Monitor I/Os. Weaned off of pressors. Poor prognosis. Cardio following and recs apprec  HTN: holding all home anti-HTN meds  A. fib: w/ RVR. New onset. Continue on amio, eliquis as per cardio. Cardio recs apprec    Acute hypoxic respiratory failure: s/p intubation, ventilation & extubation. Continue on supplemental oxygen and wean as tolerated   Possible CAP: completed abx course. Continue on bronchodilators  B/l pleural effusions: holding lasix    AKI: Cr is labile. Avoid nephrotoxic meds   Hypokalemia: potassium given   Metabolic alkalosis: secondary to diuresis. D/c diamox  as per cardio  DM2: well controlled, HbA1c 6.6. Continue on glargine, SSI w/ accucheks  Hypothyroidism: continue on levothyroxine    Obesity: BMI 30.6. Complicates overall care & prognosis       DVT prophylaxis: lovenox  Code Status: DNR/DNI  Family Communication: discussed pt's care w/ pt's family member, Jodie, and answered her questions Disposition Plan: unclear  Level of care: Stepdown Consultants:  ICU Cardio   Procedures:   Antimicrobials:    Subjective: Pt is still  lethargic   Objective: Vitals:   02/08/24 0400 02/08/24 0500 02/08/24 0600 02/08/24 0700  BP: (!) 99/49 109/67 (!) 110/58 110/62  Pulse: 77 81 85 87  Resp: (!) 25 18 14 11   Temp:      TempSrc:      SpO2: 96% 95% 96% 97%  Weight:  79.1 kg    Height:        Intake/Output Summary (Last 24 hours) at 02/08/2024 0857 Last data filed at 02/08/2024 0500 Gross per 24 hour  Intake 493.65 ml  Output 1650 ml  Net -1156.35 ml   Filed Weights   02/06/24 0500 02/07/24 0500 02/08/24 0500  Weight: 79.1 kg 81.1 kg 79.1 kg    Examination:  General exam: appears calm but uncomfortable  Respiratory system: diminished breath sounds b/l  Cardiovascular system: S1/S2+. No rubs or clicks  Gastrointestinal system: Abd is soft, NT, & hypoactive bowel sounds Central nervous system: lethargic  Psychiatry: Judgement and insight appears not a baseline      Data Reviewed: I have personally reviewed following labs and imaging studies  CBC: Recent Labs  Lab 02/04/24 0356 02/05/24 0314 02/06/24 0412 02/07/24 0543 02/08/24 0507  WBC 16.2* 14.0* 17.2* 15.1* 14.9*  HGB 11.0* 10.7* 11.5* 11.6* 11.5*  HCT 35.1* 34.5* 37.2 36.7 36.2  MCV 85.4 86.7 85.7 84.8 84.6  PLT 261 262 360 331 339   Basic Metabolic Panel: Recent Labs  Lab 02/04/24 1558 02/05/24 0314 02/06/24 0409 02/06/24 0412 02/06/24 1228 02/07/24 0543 02/08/24 0507  NA 136 135 133*  --  132* 134* 135  K 3.8 4.1 3.1*  --  4.6 3.2*  3.2*  CL 95* 90* 82*  --  84* 87* 92*  CO2 29 34* 34*  --  33* 33* 31  GLUCOSE 154* 192* 262*  --  191* 173* 106*  BUN 60* 69* 74*  --  79* 89* 79*  CREATININE 1.14* 1.18* 1.25*  --  1.22* 1.32* 1.24*  CALCIUM 9.2 10.3 11.0*  --  11.1* 10.5* 10.2  MG 1.8 2.2  --  1.8  --  2.3 2.1  PHOS 4.5 6.4* 7.8*  --  6.6* 6.1*  --    GFR: Estimated Creatinine Clearance: 36.2 mL/min (A) (by C-G formula based on SCr of 1.24 mg/dL (H)). Liver Function Tests: Recent Labs  Lab 02/04/24 1558 02/05/24 0314  02/06/24 0409 02/06/24 1228 02/07/24 0543  ALBUMIN 3.0* 3.2* 3.6 3.8 3.7   No results for input(s): "LIPASE", "AMYLASE" in the last 168 hours. No results for input(s): "AMMONIA" in the last 168 hours. Coagulation Profile: No results for input(s): "INR", "PROTIME" in the last 168 hours. Cardiac Enzymes: No results for input(s): "CKTOTAL", "CKMB", "CKMBINDEX", "TROPONINI" in the last 168 hours. BNP (last 3 results) No results for input(s): "PROBNP" in the last 8760 hours. HbA1C: No results for input(s): "HGBA1C" in the last 72 hours. CBG: Recent Labs  Lab 02/07/24 1524 02/07/24 1932 02/07/24 2320 02/08/24 0305 02/08/24 0716  GLUCAP 189* 173* 195* 123* 115*   Lipid Profile: No results for input(s): "CHOL", "HDL", "LDLCALC", "TRIG", "CHOLHDL", "LDLDIRECT" in the last 72 hours.  Thyroid  Function Tests: No results for input(s): "TSH", "T4TOTAL", "FREET4", "T3FREE", "THYROIDAB" in the last 72 hours. Anemia Panel: No results for input(s): "VITAMINB12", "FOLATE", "FERRITIN", "TIBC", "IRON", "RETICCTPCT" in the last 72 hours. Sepsis Labs: Recent Labs  Lab 02/03/24 0441 02/04/24 0356  LATICACIDVEN 1.0 1.3    No results found for this or any previous visit (from the past 240 hours).        Radiology Studies: No results found.       Scheduled Meds:  amiodarone   200 mg Oral BID   aspirin   81 mg Oral Daily   Chlorhexidine  Gluconate Cloth  6 each Topical Daily   enoxaparin  (LOVENOX ) injection  1 mg/kg Subcutaneous BID   insulin  aspart  0-20 Units Subcutaneous Q4H   insulin  glargine-yfgn  10 Units Subcutaneous BID   levothyroxine   75 mcg Oral Q0600   pantoprazole   40 mg Oral Daily   potassium chloride   40 mEq Oral Once   QUEtiapine   25 mg Oral BID   sodium chloride  flush  10-40 mL Intracatheter Q12H   spironolactone   25 mg Oral Daily   Continuous Infusions:  dextrose        LOS: 11 days       Alphonsus Jeans, MD [facility] Pager [phone]  [phone]  If 7PM-7AM, please contact night-coverage [email or web address] 02/08/2024, 8:57 AM

## 2024-02-08 NOTE — NC FL2 (Signed)
 [place]  MEDICAID [id number] LEVEL OF CARE FORM     IDENTIFICATION  Patient Name: Tanya Frye Birthdate: 17-May-1941 Sex: female Admission Date (Current Location): 01/28/2024  [facility] [place] Number:  Chiropodist and Address:  [facility], [address], [place], [place] [address]      Provider Number: [id number]  Attending Physician Name and Address:  Alphonsus Jeans, MD  Relative Name and Phone Number:  Theodis Fiscal [phone]    Current Level of Care: Hospital Recommended Level of Care: Skilled Nursing Facility Prior Approval Number:    Date Approved/Denied:   PASRR Number: [id number]  Discharge Plan: SNF    Current Diagnoses: Patient Active Problem List   Diagnosis Date Noted   Pressure injury of skin 02/04/2024   Atrial fibrillation with RVR (HCC) 02/01/2024   Sepsis (HCC) 01/29/2024   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   Non-STEMI (non-ST elevated myocardial infarction) (HCC) 01/29/2024   Community acquired pneumonia 01/29/2024   Cardiogenic shock (HCC) 01/29/2024   Acute respiratory failure (HCC) 01/28/2024   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   Tachycardia 08/29/2020   Need for influenza vaccination 05/30/2020   Seasonal allergic rhinitis due to pollen 02/24/2020   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, unsp (HCC) 02/24/2020   Morbid obesity (HCC) 02/24/2020   Essential hypertension 02/24/2020   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    Orientation RESPIRATION BLADDER Height & Weight     Self, Time  Normal, O2 (5L per [place]) Incontinent Weight: 79.1 kg Height:  5' 4.02" (162.6 cm)  BEHAVIORAL SYMPTOMS/MOOD NEUROLOGICAL BOWEL NUTRITION STATUS      Incontinent  (See Discharge Summary)  AMBULATORY STATUS COMMUNICATION OF NEEDS Skin   Extensive Assist Verbally Normal                       Personal Care Assistance Level of Assistance  Bathing, Feeding, Dressing Bathing  Assistance: Maximum assistance Feeding assistance: Limited assistance Dressing Assistance: Maximum assistance     Functional Limitations Info  Sight, Hearing, Speech Sight Info: Adequate Hearing Info: Impaired (Wears bilateral hearing aids.) Speech Info: Adequate    SPECIAL CARE FACTORS FREQUENCY  PT (By licensed PT), OT (By licensed OT)     PT Frequency: 5x weekly OT Frequency: 5x weekly            Contractures Contractures Info: Not present    Additional Factors Info  Code Status, Allergies Code Status Info: DNR-Limited Allergies Info: Penicillins           Current Medications (02/08/2024):  This is the current hospital active medication list Current Facility-Administered Medications  Medication Dose Route Frequency Provider Last Rate Last Admin   amiodarone  (PACERONE ) tablet 200 mg  200 mg Oral BID Harrold Lincoln, MD   200 mg at 02/08/24 0958   apixaban (ELIQUIS) tablet 5 mg  5 mg Oral BID Patel, Kishan S, RPH       aspirin  chewable tablet 81 mg  81 mg Oral Daily Chappell, Alex B, RPH   81 mg at 02/08/24 2536   Chlorhexidine  Gluconate Cloth 2 % PADS 6 each  6 each Topical Daily Assaker, Marianne Shirts, MD   6 each at 02/07/24 2000   dextrose  10 % infusion   Intravenous Continuous PRN Keene, Jeremiah D, NP       dextrose  50 % solution 0-50 mL  0-50 mL Intravenous PRN Rust-Chester, Gara July, NP       docusate sodium  (COLACE)  capsule 100 mg  100 mg Oral BID PRN Chappell, Alex B, RPH       insulin  aspart (novoLOG ) injection 0-20 Units  0-20 Units Subcutaneous Q4H Rust-Chester, Gara July, NP   7 Units at 02/08/24 1300   insulin  glargine-yfgn (SEMGLEE ) injection 10 Units  10 Units Subcutaneous BID [name], MD   10 Units at 02/08/24 1033   levothyroxine  (SYNTHROID ) tablet 75 mcg  75 mcg Oral Q0600 Chappell, Alex B, RPH   75 mcg at 02/08/24 7829   losartan  (COZAAR ) tablet 12.5 mg  12.5 mg Oral Daily Harrold Lincoln, MD   12.5 mg at 02/08/24 1426   Oral  care mouth rinse  15 mL Mouth Rinse PRN Kasa, Kurian, MD       pantoprazole  (PROTONIX ) EC tablet 40 mg  40 mg Oral Daily Alphonsus Jeans, MD   40 mg at 02/08/24 5621   polyethylene glycol (MIRALAX  / GLYCOLAX ) packet 17 g  17 g Oral Daily PRN Chappell, Alex B, RPH       QUEtiapine  (SEROQUEL ) tablet 25 mg  25 mg Oral BID Chappell, Alex B, RPH   25 mg at 02/08/24 3086   sodium chloride  flush (NS) 0.9 % injection 10-40 mL  10-40 mL Intracatheter Q12H Assaker, Marianne Shirts, MD   20 mL at 02/08/24 1003   sodium chloride  flush (NS) 0.9 % injection 10-40 mL  10-40 mL Intracatheter PRN Assaker, Marianne Shirts, MD       spironolactone  (ALDACTONE ) tablet 25 mg  25 mg Oral Daily Chappell, Alex B, RPH   25 mg at 02/08/24 1002     Discharge Medications: Please see discharge summary for a list of discharge medications.  Relevant Imaging Results:  Relevant Lab Results:   Additional Information [id number]  Tiffiany Beadles A Chan Sheahan, RN

## 2024-02-08 NOTE — Progress Notes (Signed)
 Speech Language Pathology Treatment: Dysphagia  Patient Details Name: Tanya Frye MRN: [id number] DOB: 1940/10/10 Today's Date: 02/08/2024 Time: 1220-1230 SLP Time Calculation (min) (ACUTE ONLY): 10 min  Assessment / Plan / Recommendation Clinical Impression  Pt seen today for diet toleration and potential for further diet advancement. While pt is awake, she is minimall conversant. Her daughter was present and reports that pt liked the pudding and magic cup more than the "other things' referring to puree items. Skilled observed was provided on pt consuming portions of her lunch tray and thin liquids via straw. Pt consumed with mildly slow oral phase but the appearance of a timely pharyngeal phase. Given pt's appearance of fatigue, will assess more advanced textures at next date to continue to allow for improvement.    HPI HPI: 83 y.o. female with history of obesity, DM II, HTN, HOH, hypothyroidism, gastric cancer in remission. Admitted with profound mixed septic/cardiogenic shock, acute respiratory failure and Afib with RVR. CXR 5/16 "No significant interval change.  2. Persistent bibasilar collapse/consolidation with probable  bilateral pleural effusions."      SLP Plan  Continue with current plan of care      Recommendations for follow up therapy are one component of a multi-disciplinary discharge planning process, led by the attending physician.  Recommendations may be updated based on patient status, additional functional criteria and insurance authorization.    Recommendations  Diet recommendations: Dysphagia 1 (puree);Thin liquid Liquids provided via: Cup;Straw Medication Administration: Crushed with puree Supervision: Staff to assist with self feeding;Full supervision/cueing for compensatory strategies Compensations: Minimize environmental distractions;Slow rate;Small sips/bites Postural Changes and/or Swallow Maneuvers: Seated upright 90 degrees;Upright 30-60 min after meal                   Oral care before and after PO;Oral care BID;Staff/trained caregiver to provide oral care     Dysphagia, oropharyngeal phase (R13.12)     Continue with current plan of care   Dhruti Ghuman B. Garlin Junker, M.S., CCC-SLP, Tree surgeon Certified Brain Injury Specialist [facility]  [facility] Rehabilitation Services Office [phone] Ascom [phone] Fax [phone]

## 2024-02-08 NOTE — Progress Notes (Signed)
 Cardiology Progress Note  Patient ID: Tanya Frye MRN: [id number] DOB: 1940/10/10 Date of Encounter: 02/08/2024 Primary Cardiologist: Belva Boyden, MD  Subjective   Chief Complaint: R hip pain  HPI: More conversational this morning.  Describes right hip pain.  Off milrinone .  Remains in A-fib.  Denies trouble breathing.  ROS:  All other ROS reviewed and negative. Pertinent positives noted in the HPI.     Telemetry  Overnight telemetry shows A-fib heart rate 90s, which I personally reviewed.    Physical Exam   Vitals:   02/08/24 1000 02/08/24 1030 02/08/24 1100 02/08/24 1130  BP: (!) 110/56  114/69   Pulse: (!) 101 (!) 103 (!) 104 70  Resp: 20 16 17 16   Temp:      TempSrc:      SpO2: 97% 98% 99% 98%  Weight:      Height:        Intake/Output Summary (Last 24 hours) at 02/08/2024 1212 Last data filed at 02/08/2024 1003 Gross per 24 hour  Intake 493.65 ml  Output 1650 ml  Net -1156.35 ml       02/08/2024    5:00 AM 02/07/2024    5:00 AM 02/06/2024    5:00 AM  Last 3 Weights  Weight (lbs) 174 lb 6.1 oz 178 lb 12.7 oz 174 lb 6.1 oz  Weight (kg) 79.1 kg 81.1 kg 79.1 kg    Body mass index is 29.92 kg/m.  General: Well nourished, well developed, in no acute distress Head: Atraumatic, normal size  Eyes: PEERLA, EOMI  Neck: Supple, no JVD Endocrine: No thryomegaly Cardiac: Normal S1, S2; RRR; no murmurs, rubs, or gallops Lungs: Clear to auscultation bilaterally, no wheezing, rhonchi or rales  Abd: Soft, nontender, no hepatomegaly  Ext: No edema, pulses 2+ Musculoskeletal: No deformities, BUE and BLE strength normal and equal Skin: Warm and dry, no rashes   Neuro: Alert and oriented to person, place, time, and situation, CNII-XII grossly intact, no focal deficits  Psych: Normal mood and affect   Cardiac Studies  TTE 01/29/2024  1. Left ventricular ejection fraction, by estimation, is 25 to 30%. The  left ventricle has severely decreased function. The left ventricle   demonstrates global hypokinesis with severe hypokinesis of teh basal to  mid anterior/anteroseptal wall (apical  region not well visualized). The left ventricular internal cavity size was  mildly dilated. Left ventricular diastolic parameters are indeterminate.   2. Right ventricular systolic function is normal. The right ventricular  size is normal. There is normal pulmonary artery systolic pressure. The  estimated right ventricular systolic pressure is 34.4 mmHg.   3. The mitral valve is normal in structure. Mild to moderate mitral valve  regurgitation. No evidence of mitral stenosis. Moderate mitral annular  calcification.   4. The aortic valve is normal in structure. There is moderate  calcification of the aortic valve. Aortic valve regurgitation is not  visualized. Aortic valve sclerosis/calcification is present, unable to  exclude stenosis (gradient not measured.   5. The inferior vena cava is normal in size with greater than 50%  respiratory variability, suggesting right atrial pressure of 3 mmHg.   Patient Profile  Tanya Frye is a 83 y.o. female with COPD, hypertension, diabetes admitted on 01/28/2024 with acute hypoxic respiratory failure secondary to pneumonia.  Course complicated by mixed septic shock and cardiogenic shock.  Also found to have A-fib with RVR.  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  Likely has a stress-induced cardiomyopathy given wall motion abnormality on echo.  No ST elevations to suggest this is the STEMI. - Has been weaned off milrinone .  Blood pressure is stable.  Good urine output. - Patient is more interactive today.  Given her advanced age and frailty I do not believe she is a great candidate for aggressive cardiovascular care.  I think medical management is the best option. - Continue Aldactone  25 mg daily.   Add losartan  12.5 mg daily.  We can see how she does.  Given recent cardiogenic shock hold on beta-blocker for now. - She is on amiodarone  200 mg twice daily.  Rates are controlled. - Okay for central line removal.  Hopefully she can get out of the ICU.  # Atrial fibrillation with RVR - Rates are controlled on amiodarone .  Given low EF limited options.  Would be cautious with digoxin  given AKI. - Continue amiodarone  200 mg twice daily. - On therapeutic Lovenox . - Transition to DOAC today.   # PNA - completed Abx  # AKI -resolved.       For questions or updates, please contact [facility] Please consult [email or web address] for contact info under        Signed, Melodee Spruce T. Rolm Clos, MD, [facility]  [facility]  02/08/2024 12:12 PM

## 2024-02-09 ENCOUNTER — Other Ambulatory Visit: Payer: Self-pay

## 2024-02-09 ENCOUNTER — Other Ambulatory Visit (HOSPITAL_COMMUNITY): Payer: Self-pay

## 2024-02-09 ENCOUNTER — Telehealth (HOSPITAL_COMMUNITY): Payer: Self-pay | Admitting: Pharmacy Technician

## 2024-02-09 DIAGNOSIS — R570 Cardiogenic shock: Secondary | ICD-10-CM | POA: Diagnosis not present

## 2024-02-09 DIAGNOSIS — J9600 Acute respiratory failure, unspecified whether with hypoxia or hypercapnia: Secondary | ICD-10-CM | POA: Diagnosis not present

## 2024-02-09 DIAGNOSIS — I420 Dilated cardiomyopathy: Secondary | ICD-10-CM | POA: Diagnosis not present

## 2024-02-09 DIAGNOSIS — I214 Non-ST elevation (NSTEMI) myocardial infarction: Secondary | ICD-10-CM

## 2024-02-09 DIAGNOSIS — I4891 Unspecified atrial fibrillation: Secondary | ICD-10-CM | POA: Diagnosis not present

## 2024-02-09 LAB — BASIC METABOLIC PANEL WITH GFR
Anion gap: 11 (ref 5–15)
BUN: 65 mg/dL — ABNORMAL HIGH (ref 8–23)
CO2: 27 mmol/L (ref 22–32)
Calcium: 9.7 mg/dL (ref 8.9–10.3)
Chloride: 95 mmol/L — ABNORMAL LOW (ref 98–111)
Creatinine, Ser: 0.99 mg/dL (ref 0.44–1.00)
GFR, Estimated: 57 mL/min — ABNORMAL LOW (ref >60)
Glucose, Bld: 147 mg/dL — ABNORMAL HIGH (ref 70–99)
Potassium: 3.2 mmol/L — ABNORMAL LOW (ref 3.5–5.1)
Sodium: 133 mmol/L — ABNORMAL LOW (ref 135–145)

## 2024-02-09 LAB — CBC
HCT: 35.8 % — ABNORMAL LOW (ref 36.0–46.0)
Hemoglobin: 11.4 g/dL — ABNORMAL LOW (ref 12.0–15.0)
MCH: 27 pg (ref 26.0–34.0)
MCHC: 31.8 g/dL (ref 30.0–36.0)
MCV: 84.6 fL (ref 80.0–100.0)
Platelets: 318 10*3/uL (ref 150–400)
RBC: 4.23 MIL/uL (ref 3.87–5.11)
RDW: 16.4 % — ABNORMAL HIGH (ref 11.5–15.5)
WBC: 13.3 10*3/uL — ABNORMAL HIGH (ref 4.0–10.5)
nRBC: 0 % (ref 0.0–0.2)

## 2024-02-09 LAB — GLUCOSE, CAPILLARY
Glucose-Capillary: 146 mg/dL — ABNORMAL HIGH (ref 70–99)
Glucose-Capillary: 121 mg/dL — ABNORMAL HIGH (ref 70–99)
Glucose-Capillary: 152 mg/dL — ABNORMAL HIGH (ref 70–99)
Glucose-Capillary: 230 mg/dL — ABNORMAL HIGH (ref 70–99)
Glucose-Capillary: 218 mg/dL — ABNORMAL HIGH (ref 70–99)

## 2024-02-09 MED ORDER — TORSEMIDE 20 MG PO TABS
40.0000 mg | ORAL_TABLET | Freq: Every day | ORAL | Status: DC
  Administered 2024-02-09 – 2024-02-13 (×5): 40 mg via ORAL
  Filled 2024-02-09 (×5): qty 2

## 2024-02-09 MED ORDER — POTASSIUM CHLORIDE 20 MEQ PO PACK
40.0000 meq | PACK | ORAL | Status: AC
  Administered 2024-02-09 (×2): 40 meq via ORAL
  Filled 2024-02-09 (×2): qty 2

## 2024-02-09 MED ORDER — ENSURE ENLIVE PO LIQD
237.0000 mL | Freq: Two times a day (BID) | ORAL | Status: DC
  Administered 2024-02-09 – 2024-02-13 (×8): 237 mL via ORAL

## 2024-02-09 NOTE — Progress Notes (Signed)
 Physical Therapy Treatment Patient Details Name: Tanya Frye MRN: [id number] DOB: 09-08-1941 Today's Date: 02/09/2024   History of Present Illness 83 y/o female presented to ED on 01/28/24 for SOB. Intubated 5/7-5/16 (1 way extubation). Admitted with septic shock with progressive multi organ failure. PMH: COPD, CHF, HTN, T2DM    PT Comments  Patient supine in bed on arrival and agreeable to co-tx session with OT to maximize patient's tolerance. Patient able to verbalized first name and birthdate but limited verbalizations throughout session despite max encouragement and stimulation. Required totalA+2 for bed mobility and up to maxA to maintain sitting balance. Unable to hold head up seated on EOB despite max cueing and facilitation. Cognition seems to be worsening compared to evaluation. Discharge plan remains appropriate.     If plan is discharge home, recommend the following: Two people to help with walking and/or transfers;Two people to help with bathing/dressing/bathroom;Help with stairs or ramp for entrance;Assist for transportation;Direct supervision/assist for financial management;Direct supervision/assist for medications management;Assistance with cooking/housework;Assistance with feeding   Can travel by private vehicle     No  Equipment Recommendations  Other (comment) (TBD)    Recommendations for Other Services       Precautions / Restrictions Precautions Precautions: Fall Recall of Precautions/Restrictions: Impaired Restrictions Weight Bearing Restrictions Per Provider Order: No     Mobility  Bed Mobility Overal bed mobility: Needs Assistance Bed Mobility: Supine to Sit, Sit to Supine     Supine to sit: Total assist, +2 for physical assistance, +2 for safety/equipment Sit to supine: Total assist, +2 for physical assistance, +2 for safety/equipment        Transfers                        Ambulation/Gait                   Stairs              Wheelchair Mobility     Tilt Bed    Modified Rankin (Stroke Patients Only)       Balance                                            Communication Communication Communication: Impaired Factors Affecting Communication: Hearing impaired  Cognition Arousal: Alert Behavior During Therapy: Flat affect   PT - Cognitive impairments: Difficult to assess Difficult to assess due to: Impaired communication (not answering or verbalizing during session unless max stimulation)                       Following commands: Impaired Following commands impaired: Follows one step commands inconsistently, Follows one step commands with increased time    Cueing Cueing Techniques: Verbal cues, Tactile cues  Exercises      General Comments        Pertinent Vitals/Pain Pain Assessment Pain Assessment: Faces Faces Pain Scale: Hurts little more Pain Location: R foot Pain Descriptors / Indicators: Aching, Discomfort Pain Intervention(s): Limited activity within patient's tolerance, Monitored during session, Repositioned    Home Living Family/patient expects to be discharged to:: Private residence Living Arrangements: Other (Comment) Available Help at Discharge: Family Type of Home: House                  Prior Function  PT Goals (current goals can now be found in the care plan section) Acute Rehab PT Goals Patient Stated Goal: did not state PT Goal Formulation: Patient unable to participate in goal setting Time For Goal Achievement: 02/21/24 Potential to Achieve Goals: Fair Progress towards PT goals: Not progressing toward goals - comment    Frequency    Min 1X/week      PT Plan      Co-evaluation PT/OT/SLP Co-Evaluation/Treatment: Yes Reason for Co-Treatment: Necessary to address cognition/behavior during functional activity;For patient/therapist safety;To address functional/ADL transfers;Complexity of the patient's  impairments (multi-system involvement) PT goals addressed during session: Mobility/safety with mobility;Balance OT goals addressed during session: ADL's and self-care      AM-PAC PT "6 Clicks" Mobility   Outcome Measure  Help needed turning from your back to your side while in a flat bed without using bedrails?: Total Help needed moving from lying on your back to sitting on the side of a flat bed without using bedrails?: Total Help needed moving to and from a bed to a chair (including a wheelchair)?: Total Help needed standing up from a chair using your arms (e.g., wheelchair or bedside chair)?: Total Help needed to walk in hospital room?: Total Help needed climbing 3-5 steps with a railing? : Total 6 Click Score: 6    End of Session   Activity Tolerance: Patient tolerated treatment well Patient left: in bed;with call bell/phone within reach Nurse Communication: Mobility status PT Visit Diagnosis: Unsteadiness on feet (R26.81);Muscle weakness (generalized) (M62.81);Other abnormalities of gait and mobility (R26.89)     Time: 7829-5621 PT Time Calculation (min) (ACUTE ONLY): 23 min  Charges:    $Therapeutic Activity: 8-22 mins PT General Charges $$ ACUTE PT VISIT: 1 Visit                     Janine [place], PT, DPT Physical Therapist - [facility]  [facility]    Delvis Kau A Lakshya Mcgillicuddy 02/09/2024, 2:15 PM

## 2024-02-09 NOTE — Telephone Encounter (Signed)
 Patient Product/process development scientist completed.    The patient is insured through [facility]. Patient has Medicare and is not eligible for a copay card, but may be able to apply for patient assistance or Medicare RX Payment Plan (Patient Must reach out to their plan, if eligible for payment plan), if available.    Ran test claim for Eliquis  5 mg and the current 30 day co-pay is $85.29.   This test claim was processed through [facility]- copay amounts may vary at other pharmacies due to pharmacy/plan contracts, or as the patient moves through the different stages of their insurance plan.     Morgan Arab, CPHT Pharmacy Technician III Certified Patient Advocate [facility] Patient Advocate Team Direct Number: [phone]  Fax: [phone]

## 2024-02-09 NOTE — Plan of Care (Signed)
  Problem: Education: Goal: Knowledge of General Education information will improve Description: Including pain rating scale, medication(s)/side effects and non-pharmacologic comfort measures Outcome: Progressing   Problem: Health Behavior/Discharge Planning: Goal: Ability to manage health-related needs will improve Outcome: Progressing   Problem: Clinical Measurements: Goal: Ability to maintain clinical measurements within normal limits will improve Outcome: Progressing Goal: Will remain free from infection Outcome: Progressing Goal: Diagnostic test results will improve Outcome: Progressing Goal: Respiratory complications will improve Outcome: Progressing Goal: Cardiovascular complication will be avoided Outcome: Progressing   Problem: Activity: Goal: Risk for activity intolerance will decrease Outcome: Progressing   Problem: Nutrition: Goal: Adequate nutrition will be maintained Outcome: Progressing   Problem: Coping: Goal: Level of anxiety will decrease Outcome: Progressing   Problem: Elimination: Goal: Will not experience complications related to bowel motility Outcome: Progressing Goal: Will not experience complications related to urinary retention Outcome: Progressing   Problem: Pain Managment: Goal: General experience of comfort will improve and/or be controlled Outcome: Progressing   Problem: Safety: Goal: Ability to remain free from injury will improve Outcome: Progressing   Problem: Skin Integrity: Goal: Risk for impaired skin integrity will decrease Outcome: Progressing   Problem: Education: Goal: Ability to describe self-care measures that may prevent or decrease complications (Diabetes Survival Skills Education) will improve Outcome: Progressing Goal: Individualized Educational Video(s) Outcome: Progressing   Problem: Coping: Goal: Ability to adjust to condition or change in health will improve Outcome: Progressing   Problem: Fluid  Volume: Goal: Ability to maintain a balanced intake and output will improve Outcome: Progressing   Problem: Health Behavior/Discharge Planning: Goal: Ability to identify and utilize available resources and services will improve Outcome: Progressing Goal: Ability to manage health-related needs will improve Outcome: Progressing   Problem: Metabolic: Goal: Ability to maintain appropriate glucose levels will improve Outcome: Progressing   Problem: Nutritional: Goal: Maintenance of adequate nutrition will improve Outcome: Progressing Goal: Progress toward achieving an optimal weight will improve Outcome: Progressing   Problem: Skin Integrity: Goal: Risk for impaired skin integrity will decrease Outcome: Progressing   Problem: Tissue Perfusion: Goal: Adequacy of tissue perfusion will improve Outcome: Progressing   Problem: Activity: Goal: Ability to tolerate increased activity will improve Outcome: Progressing   Problem: Respiratory: Goal: Ability to maintain a clear airway and adequate ventilation will improve Outcome: Progressing   Problem: Role Relationship: Goal: Method of communication will improve Outcome: Progressing   Problem: Education: Goal: Ability to describe self-care measures that may prevent or decrease complications (Diabetes Survival Skills Education) will improve Outcome: Progressing Goal: Individualized Educational Video(s) Outcome: Progressing   Problem: Cardiac: Goal: Ability to maintain an adequate cardiac output will improve Outcome: Progressing   Problem: Health Behavior/Discharge Planning: Goal: Ability to identify and utilize available resources and services will improve Outcome: Progressing Goal: Ability to manage health-related needs will improve Outcome: Progressing   Problem: Fluid Volume: Goal: Ability to achieve a balanced intake and output will improve Outcome: Progressing   Problem: Metabolic: Goal: Ability to maintain  appropriate glucose levels will improve Outcome: Progressing   Problem: Nutritional: Goal: Maintenance of adequate nutrition will improve Outcome: Progressing Goal: Maintenance of adequate weight for body size and type will improve Outcome: Progressing   Problem: Respiratory: Goal: Will regain and/or maintain adequate ventilation Outcome: Progressing   Problem: Urinary Elimination: Goal: Ability to achieve and maintain adequate renal perfusion and functioning will improve Outcome: Progressing

## 2024-02-09 NOTE — Progress Notes (Signed)
 Belongings transferred to rm [id number] along with patient

## 2024-02-09 NOTE — Progress Notes (Signed)
 PROGRESS NOTE    Tanya Frye  ZOX:[id number] DOB: 1941-05-15 DOA: 01/28/2024 PCP: Pcp, No   Assessment & Plan:   Principal Problem:   Cardiogenic shock (HCC) Active Problems:   Acute respiratory failure (HCC)   Sepsis (HCC)   Dilated cardiomyopathy (HCC)   Non-STEMI (non-ST elevated myocardial infarction) (HCC)   Community acquired pneumonia   Atrial fibrillation with RVR (HCC)   Pressure injury of skin  Assessment and Plan:  Shock: cardiogenic & septic. See NP Keene's note on how pt met shock criteria. D/c milirinone drip as per cardio. Continue on amio, losartan , aldactone  & torsemide  as per cardio. Continue on tele. Poor prognosis & high risk for further deterioration. Cardio following & recs apprec   Acute metabolic encephalopathy: continue w/ supportive care. Mental status unchanged from day prior.   Acute systolic CHF: decompensated. Started torsemide  as per cardio. Monitor I/Os. Weaned off of pressors. Poor prognosis & high risk for further deterioration. Cardio following and recs apprec  HTN: continue on aldactone , losartan    A. fib: w/ RVR. New onset. Continue on amio, eliquis  as per cardio. Cardio recs apprecs   Acute hypoxic respiratory failure: s/p intubation, ventilation & extubation. Continue on supplemental oxygen and wean as tolerated   Possible CAP: completed abx course.  Continue on bronchodilators   B/l pleural effusions: start torsemide    AKI: Cr is trending down today  Hypokalemia: KCl given  Metabolic alkalosis: secondary to diuresis. D/c diamox  as per cardio  DM2: well controlled, HbA1c 6.6. Continue on glargine, SSI w/ accuchecks   Hypothyroidism: continue on levothyroxine    Obesity: BMI 30.6. Complicates overall care & prognosis       DVT prophylaxis: lovenox  Code Status: DNR/DNI  Family Communication:  Disposition Plan: unclear  Level of care: Progressive Consultants:  ICU Cardio   Procedures:   Antimicrobials:     Subjective: Pt is still lethargic. Pt would not answer any of my questions today   Objective: Vitals:   02/09/24 0700 02/09/24 0730 02/09/24 0800 02/09/24 0830  BP: (!) 100/57  (!) 105/57   Pulse: 84 90 99 76  Resp: 15 17 17 17   Temp:      TempSrc:      SpO2: 98% 98% 100% 98%  Weight:      Height:        Intake/Output Summary (Last 24 hours) at 02/09/2024 0904 Last data filed at 02/09/2024 0800 Gross per 24 hour  Intake 150 ml  Output 2100 ml  Net -1950 ml   Filed Weights   02/07/24 0500 02/08/24 0500 02/09/24 0447  Weight: 81.1 kg 79.1 kg 79.2 kg    Examination:  General exam: appears uncomfortable  Respiratory system: decreased breath sounds b/l Cardiovascular system: S1 & S2+. Gastrointestinal system: abd is soft, NT, ND & hypoactive bowel sounds  Central nervous system: lethargic  Psychiatry: judgement and insight appears poor     Data Reviewed: I have personally reviewed following labs and imaging studies  CBC: Recent Labs  Lab 02/05/24 0314 02/06/24 0412 02/07/24 0543 02/08/24 0507 02/09/24 0433  WBC 14.0* 17.2* 15.1* 14.9* 13.3*  HGB 10.7* 11.5* 11.6* 11.5* 11.4*  HCT 34.5* 37.2 36.7 36.2 35.8*  MCV 86.7 85.7 84.8 84.6 84.6  PLT 262 360 331 339 318   Basic Metabolic Panel: Recent Labs  Lab 02/04/24 1558 02/05/24 0314 02/06/24 0409 02/06/24 0412 02/06/24 1228 02/07/24 0543 02/08/24 0507 02/09/24 0433  NA 136 135 133*  --  132* 134* 135 133*  K  3.8 4.1 3.1*  --  4.6 3.2* 3.2* 3.2*  CL 95* 90* 82*  --  84* 87* 92* 95*  CO2 29 34* 34*  --  33* 33* 31 27  GLUCOSE 154* 192* 262*  --  191* 173* 106* 147*  BUN 60* 69* 74*  --  79* 89* 79* 65*  CREATININE 1.14* 1.18* 1.25*  --  1.22* 1.32* 1.24* 0.99  CALCIUM 9.2 10.3 11.0*  --  11.1* 10.5* 10.2 9.7  MG 1.8 2.2  --  1.8  --  2.3 2.1  --   PHOS 4.5 6.4* 7.8*  --  6.6* 6.1*  --   --    GFR: Estimated Creatinine Clearance: 45.4 mL/min (by C-G formula based on SCr of 0.99 mg/dL). Liver  Function Tests: Recent Labs  Lab 02/04/24 1558 02/05/24 0314 02/06/24 0409 02/06/24 1228 02/07/24 0543  ALBUMIN 3.0* 3.2* 3.6 3.8 3.7   No results for input(s): "LIPASE", "AMYLASE" in the last 168 hours. No results for input(s): "AMMONIA" in the last 168 hours. Coagulation Profile: No results for input(s): "INR", "PROTIME" in the last 168 hours. Cardiac Enzymes: No results for input(s): "CKTOTAL", "CKMB", "CKMBINDEX", "TROPONINI" in the last 168 hours. BNP (last 3 results) No results for input(s): "PROBNP" in the last 8760 hours. HbA1C: No results for input(s): "HGBA1C" in the last 72 hours. CBG: Recent Labs  Lab 02/08/24 1524 02/08/24 2042 02/08/24 2325 02/09/24 0423 02/09/24 0821  GLUCAP 168* 167* 136* 146* 121*   Lipid Profile: No results for input(s): "CHOL", "HDL", "LDLCALC", "TRIG", "CHOLHDL", "LDLDIRECT" in the last 72 hours.  Thyroid  Function Tests: No results for input(s): "TSH", "T4TOTAL", "FREET4", "T3FREE", "THYROIDAB" in the last 72 hours. Anemia Panel: No results for input(s): "VITAMINB12", "FOLATE", "FERRITIN", "TIBC", "IRON", "RETICCTPCT" in the last 72 hours. Sepsis Labs: Recent Labs  Lab 02/03/24 0441 02/04/24 0356  LATICACIDVEN 1.0 1.3    No results found for this or any previous visit (from the past 240 hours).        Radiology Studies: No results found.       Scheduled Meds:  amiodarone   200 mg Oral BID   apixaban   5 mg Oral BID   aspirin   81 mg Oral Daily   Chlorhexidine  Gluconate Cloth  6 each Topical Daily   feeding supplement  237 mL Oral BID BM   insulin  aspart  0-20 Units Subcutaneous Q4H   insulin  glargine-yfgn  10 Units Subcutaneous BID   levothyroxine   75 mcg Oral Q0600   losartan   12.5 mg Oral Daily   pantoprazole   40 mg Oral Daily   potassium chloride   40 mEq Oral Q4H   QUEtiapine   25 mg Oral BID   sodium chloride  flush  10-40 mL Intracatheter Q12H   spironolactone   25 mg Oral Daily   Continuous Infusions:   dextrose        LOS: 12 days       Alphonsus Jeans, MD [facility] Pager [phone]  If 7PM-7AM, please contact night-coverage [email or web address] 02/09/2024, 9:04 AM

## 2024-02-09 NOTE — Progress Notes (Addendum)
 Palliative Care Progress Note, Assessment & Plan   Patient Name: Tanya Frye       Date: 02/09/2024 DOB: 08-Oct-1940  Age: 83 y.o. MRN#: [id number] Attending Physician: Alphonsus Jeans, MD Primary Care Physician: Pcp, No Admit Date: 01/28/2024  Subjective: Patient is sitting up in bed, awake and alert.  She acknowledges my presence.  She makes no vocalizations during my visit.  RN is at bedside during my visit, who endorses that patient does make some vocalizations and appropriately but has not participated in linear or follow-up questions/conversations after 1 or 2 word answers.Tanya Frye  HPI: 83 y.o. female  with past medical history of COPD, CHF, HTN, type 2 diabetes, and HOH admitted on 01/28/2024 with acute respiratory distress requiring BiPAP.   Upon arrival to ED, patient found to be in A-fib with RVR and was given amiodarone .  Additionally, patient developed sepsis and cardiogenic shock requiring pressor support.  PCCM was consulted and later that evening patient required intubation.   Patient remains critically ill with multiorgan failure supported by 1 vasopressor. Leukocytosis has improved from 18.7-> 11.7 in the past 24 hours. LVEF on echo 25-30% with global hypokinesis.   PMT was consulted to support patient and family with goals of care discussions.  Summary of counseling/coordination of care: Extensive chart review completed prior to meeting patient including labs, vital signs, imaging, progress notes, orders, and available advanced directive documents from current and previous encounters.   After reviewing the patient's chart and assessing the patient at bedside, I spoke with patient in regards to symptom management and goals of care.   Patient is unable to participate in medical decision making,  symptom assessment, or goals of care discussions independently at this time.  She is able to follow simple commands but does not make any vocalizations or engage in discussions with me.  Nonverbal signs of distress or pain not noted.  No adjustment to [facility] needed at this time.  Patient was able to move upper and lower extremities on command, lift up her eyebrows, puff out her cheeks, and track me with her eyes.  She is unable to make vocalizations.  When asked if she is having thoughts that she cannot express with words, patient shakes her head no.  Question reasons why patient is unable to make vocalizations.  However, RN shares that patient is able to make vocalizations but perhaps is just not wanting to talk at this time.  He shares that she will say if something is hurting, will make comments if something is touching her and causing pain, and has engaged appropriately in yes/no questions.  However, she remains nonverbal with me today.  Copy of MOST form left at bedside for patient and family to complete if interested.  Advised RN that PMT will step back from daily visits.  PMT remains available to patient and family throughout her hospitalization.  Please reengage with PMT if goals change, at patient/family's request, or if patient's health deteriorates during hospitalization.  Patient's daughter-in-law Tanya Frye is familiar to me as I have had several conversations throughout the past 12 days of her hospitalization.  Tanya Frye has PMT contact info and knows to reach out should any acute palliative needs  arise.  Physical Exam Vitals reviewed.  Constitutional:      General: She is not in acute distress.    Appearance: She is obese.  HENT:     Head: Normocephalic.     Nose: Nose normal.     Mouth/Throat:     Pharynx: Oropharynx is clear.     Comments: Missing teeth Eyes:     Pupils: Pupils are equal, round, and reactive to light.  Pulmonary:     Effort: Pulmonary effort is normal.  Abdominal:      Palpations: Abdomen is soft.  Musculoskeletal:     Comments: MAETC  Skin:    General: Skin is warm and dry.  Neurological:     Mental Status: She is alert.     Comments: UTA  Psychiatric:        Mood and Affect: Mood normal.        Behavior: Behavior normal.             Total Time 25 minutes   Time spent includes: Detailed review of medical records (labs, imaging, vital signs), medically appropriate exam (mental status, respiratory, cardiac, skin), discussed with treatment team, counseling and educating patient, family and staff, documenting clinical information, medication management and coordination of care.  Judeen Nose L. Rebbeca Campi, DNP, FNP-BC Palliative Medicine Team

## 2024-02-09 NOTE — Plan of Care (Signed)
  Problem: Education: Goal: Knowledge of General Education information will improve Description: Including pain rating scale, medication(s)/side effects and non-pharmacologic comfort measures Outcome: Progressing   Problem: Health Behavior/Discharge Planning: Goal: Ability to manage health-related needs will improve Outcome: Progressing   Problem: Clinical Measurements: Goal: Ability to maintain clinical measurements within normal limits will improve Outcome: Progressing Goal: Will remain free from infection Outcome: Progressing Goal: Diagnostic test results will improve Outcome: Progressing Goal: Respiratory complications will improve Outcome: Progressing Goal: Cardiovascular complication will be avoided Outcome: Progressing   Problem: Activity: Goal: Risk for activity intolerance will decrease Outcome: Progressing   Problem: Nutrition: Goal: Adequate nutrition will be maintained Outcome: Progressing   Problem: Coping: Goal: Level of anxiety will decrease Outcome: Progressing   Problem: Elimination: Goal: Will not experience complications related to bowel motility Outcome: Progressing Goal: Will not experience complications related to urinary retention Outcome: Progressing   Problem: Pain Managment: Goal: General experience of comfort will improve and/or be controlled Outcome: Progressing   Problem: Safety: Goal: Ability to remain free from injury will improve Outcome: Progressing   Problem: Education: Goal: Ability to describe self-care measures that may prevent or decrease complications (Diabetes Survival Skills Education) will improve Outcome: Progressing

## 2024-02-09 NOTE — Progress Notes (Signed)
 Advanced Heart Failure Rounding Note  Cardiologist: Belva Boyden, MD  Chief Complaint:  Subjective:    - Awake/alert; laying comfortably in bed in NAD. Very weak on exam.  - CVP 3-6 with respiratory variation.   Objective:   Weight Range: 79.2 kg Body mass index is 29.96 kg/m.   Vital Signs:   Temp:  [97.8 F (36.6 C)-99.3 F (37.4 C)] 97.8 F (36.6 C) (05/19 0108) Pulse Rate:  [70-110] 87 (05/19 1015) Resp:  [13-26] 16 (05/19 1015) BP: (90-139)/(46-81) 108/61 (05/19 1000) SpO2:  [97 %-100 %] 98 % (05/19 1015) Weight:  [79.2 kg] 79.2 kg (05/19 0447) Last BM Date : 02/08/24  Weight change: Filed Weights   02/07/24 0500 02/08/24 0500 02/09/24 0447  Weight: 81.1 kg 79.1 kg 79.2 kg    Intake/Output:   Intake/Output Summary (Last 24 hours) at 02/09/2024 1043 Last data filed at 02/09/2024 1034 Gross per 24 hour  Intake 160 ml  Output 1750 ml  Net -1590 ml      Physical Exam    GENERAL: weak/frail PULM: normal work of breathing; mild crackles at bases CARDIAC:  JVP: 5-6         Irregular rate and rhythm, no murmurs, trace lower extremity edema  ABDOMEN: Soft, non-tender, non-distended. NEUROLOGIC: Nods head in answer to commands, moves all 4's follows commands    Medications:     Scheduled Medications:  amiodarone   200 mg Oral BID   apixaban   5 mg Oral BID   aspirin   81 mg Oral Daily   Chlorhexidine  Gluconate Cloth  6 each Topical Daily   feeding supplement  237 mL Oral BID BM   insulin  aspart  0-20 Units Subcutaneous Q4H   insulin  glargine-yfgn  10 Units Subcutaneous BID   levothyroxine   75 mcg Oral Q0600   losartan   12.5 mg Oral Daily   pantoprazole   40 mg Oral Daily   potassium chloride   40 mEq Oral Q4H   QUEtiapine   25 mg Oral BID   sodium chloride  flush  10-40 mL Intracatheter Q12H   spironolactone   25 mg Oral Daily   torsemide   40 mg Oral Daily    Infusions:  dextrose       PRN Medications: dextrose , dextrose , docusate sodium ,  mouth rinse, polyethylene glycol, sodium chloride  flush    Patient Profile   83 y.o. female with history of obesity, DM II, HTN, HOH, hypothyroidism, gastric cancer in remission. Admitted with profound mixed septic/cardiogenic shock, acute respiratory failure and Afib with RVR   Assessment/Plan   Shock (Resolved) -Euvolemic on exam, off pressors & extubated now.   Acute HFrEF - New, though potentially had a history of HF per family at bedside - Echo 05/25: EF 25-30%, preserved basal segements, RV dysfunction, and poor apical windows - Euvolemic on exam today with CVP of 3-6. Telemetry with rate controlled atrial fibrillation.  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 CT chest from admission with diffuse calcifications in the LAD/Lcx. No urgent indication for cath at this time. She is very frail at baseline with likely a poor 1 year prognosis. Will manage medically. Start lasix  40mg  daily. Transfer to floor. D/C central venous catheter.    Afib with RVR - Newly diagnosed. Suspect a consequence of ongoing shock and new HF - TSH okay. On synthroid  at home - Rate controlled in the 90s on amiodarone  200mg  BID. Will transfer to floor. She can come back for outpatient cardioversion after 3 weeks of  anticoagulation. Will avoid TEE.    Acute respiratory failure -resolved; on room air today.    DM II - A1c 6.7% - Per primary    Medication concerns reviewed with patient and pharmacy team. Barriers identified: Anticoagulation, inotropes, high dose diuretics  Length of Stay: 12  Kadyn Chovan, DO  02/09/2024, 10:43 AM  Advanced Heart Failure Team Pager [phone] (M-F; 7a - 5p)  Please contact [facility] for night-coverage after hours (5p -7a ) and weekends on [email or web address]

## 2024-02-09 NOTE — Progress Notes (Signed)
 Occupational Therapy Treatment Patient Details Name: Tanya Frye MRN: [id number] DOB: [birth year] Today's Date: 02/09/2024   History of present illness 83 y/o female presented to ED on 01/28/24 for SOB. Intubated 5/7-5/16 (1 way extubation). Admitted with septic shock with progressive multi organ failure. PMH: COPD, CHF, HTN, T2DM   OT comments  Upon entering the room, pt supine in bed and agreeable to co-treatment with PT and OT. Pt verbalizes her first name and birthday but does not verbalize any further during session. Pt needing total A of 2 for bed mobility. Pt does not initiate at all with attempts to stand on EOB so is unable to stand at this time. Pt needing max A for sitting balance while on EOB and returns to supine with +2 assistance.Wet cloth placed on pt's face with cues to wash face. OT placing hand on cloth x 2 reps and she finally grasps it and pulls it from eyes. Pt needing max multimodal cuing for initiation this session.       If plan is discharge home, recommend the following:  Two people to help with walking and/or transfers;Two people to help with bathing/dressing/bathroom;Assistance with cooking/housework;Assistance with feeding;Help with stairs or ramp for entrance;Assist for transportation;Direct supervision/assist for financial management;Direct supervision/assist for medications management;Supervision due to cognitive status   Equipment Recommendations  Other (comment) (defer to next venue of care)       Precautions / Restrictions Precautions Precautions: Fall Recall of Precautions/Restrictions: Impaired       Mobility Bed Mobility Overal bed mobility: Needs Assistance Bed Mobility: Supine to Sit, Sit to Supine     Supine to sit: Total assist, +2 for physical assistance, +2 for safety/equipment Sit to supine: Total assist, +2 for physical assistance, +2 for safety/equipment                 Balance Overall balance assessment: Needs  assistance Sitting-balance support: Bilateral upper extremity supported, Feet unsupported Sitting balance-Leahy Scale: Poor Sitting balance - Comments: required up to maxA to maintain sitting balance Postural control: Posterior lean                                 ADL either performed or assessed with clinical judgement   ADL Overall ADL's : Needs assistance/impaired                                       General ADL Comments: Pt unable to follow any commands for self care tasks and needing hand over hand assistance    Extremity/Trunk Assessment Upper Extremity Assessment Upper Extremity Assessment: Generalized weakness   Lower Extremity Assessment Lower Extremity Assessment: Generalized weakness   Cervical / Trunk Assessment Cervical / Trunk Assessment: Kyphotic    Vision Patient Visual Report: No change from baseline           Communication Communication Communication: Impaired Factors Affecting Communication: Hearing impaired   Cognition Arousal: Alert Behavior During Therapy: Flat affect Cognition: Difficult to assess Difficult to assess due to: Impaired communication                             Following commands: Impaired Following commands impaired: Follows one step commands inconsistently, Follows one step commands with increased time      Cueing   Cueing Techniques: Verbal cues,  Tactile cues             Pertinent Vitals/ Pain       Pain Assessment Pain Assessment: Faces Faces Pain Scale: Hurts little more Pain Location: R foot Pain Descriptors / Indicators: Aching, Discomfort Pain Intervention(s): Monitored during session, Repositioned, Limited activity within patient's tolerance  Home Living Family/patient expects to be discharged to:: Private residence Living Arrangements: Other (Comment) Available Help at Discharge: Family Type of Home: House                                       Frequency  Min 2X/week        Progress Toward Goals  OT Goals(current goals can now be found in the care plan section)  Progress towards OT goals: Progressing toward goals         Co-evaluation    PT/OT/SLP Co-Evaluation/Treatment: Yes Reason for Co-Treatment: Necessary to address cognition/behavior during functional activity;For patient/therapist safety;To address functional/ADL transfers;Complexity of the patient's impairments (multi-system involvement) PT goals addressed during session: Mobility/safety with mobility;Balance OT goals addressed during session: ADL's and self-care      AM-PAC OT "6 Clicks" Daily Activity     Outcome Measure   Help from another person eating meals?: Total Help from another person taking care of personal grooming?: Total Help from another person toileting, which includes using toliet, bedpan, or urinal?: Total Help from another person bathing (including washing, rinsing, drying)?: Total Help from another person to put on and taking off regular upper body clothing?: Total Help from another person to put on and taking off regular lower body clothing?: Total 6 Click Score: 6    End of Session Equipment Utilized During Treatment: Oxygen  OT Visit Diagnosis: Other abnormalities of gait and mobility (R26.89);Muscle weakness (generalized) (M62.81)   Activity Tolerance Patient tolerated treatment well   Patient Left in bed;with call bell/phone within reach;with bed alarm set   Nurse Communication Mobility status        Time: 8119-1478 OT Time Calculation (min): 23 min  Charges: OT General Charges $OT Visit: 1 Visit OT Treatments $Therapeutic Activity: 8-22 mins  George Kinder, MS, OTR/L , CBIS ascom [phone]  02/09/24, 2:09 PM

## 2024-02-09 NOTE — Progress Notes (Signed)
 Speech Language Pathology Treatment: Dysphagia  Patient Details Name: Tanya Frye MRN: [id number] DOB: [birth year] Today's Date: 02/09/2024 Time: 4098-1191 SLP Time Calculation (min) (ACUTE ONLY): 25 min  Assessment / Plan / Recommendation Clinical Impression  Pt seen for follow up dysphagia intervention with goal to trial advanced solids. No updated chest imaging noted. Pt afebrile, on room air, with WBC trending down. Pt seen with puree, chopped consistent, and thin liquids via straw. Despite lack of dentition, pt demonstrated adequate mastication and clearance for chopped consistent trials, with independent use of liquid wash following completion of the trial. Delayed cough noted x2, though not repeated with additional trials of thin liquids. Vitals stable t/o session. Recommend Dys 2 (chopped) and thin liquids with continued aspiration precautions- slow rate, small bites, elevated HOB, and alert for PO intake. Alt solids and liquids, with intermittent visual inspection for complete oral clearance. Monitor endurance for intake. Supervision and assistance for intake. RN and MD aware of recommendations. SLP will continue to follow.    HPI HPI: 83 y.o. female with history of obesity, DM II, HTN, HOH, hypothyroidism, gastric cancer in remission. Admitted with profound mixed septic/cardiogenic shock, acute respiratory failure and Afib with RVR. CXR 5/16 "No significant interval change.  2. Persistent bibasilar collapse/consolidation with probable  bilateral pleural effusions."      SLP Plan  Continue with current plan of care      Recommendations for follow up therapy are one component of a multi-disciplinary discharge planning process, led by the attending physician.  Recommendations may be updated based on patient status, additional functional criteria and insurance authorization.    Recommendations  Diet recommendations: Dysphagia 2 (fine chop);Thin liquid Liquids provided via:  Cup;Straw Medication Administration: Crushed with puree Supervision: Staff to assist with self feeding;Full supervision/cueing for compensatory strategies Compensations: Minimize environmental distractions;Slow rate;Small sips/bites Postural Changes and/or Swallow Maneuvers: Seated upright 90 degrees;Upright 30-60 min after meal                  Oral care before and after PO;Oral care BID;Staff/trained caregiver to provide oral care   Frequent or constant Supervision/Assistance Dysphagia, oropharyngeal phase (R13.12)     Continue with current plan of care    [place] Tanya Sherk Clapp, MS, CCC-SLP Speech Language Pathologist Rehab Services; [facility] [phone] (ascom)   [place] J Frye  02/09/2024, 11:10 AM

## 2024-02-10 DIAGNOSIS — I214 Non-ST elevation (NSTEMI) myocardial infarction: Secondary | ICD-10-CM | POA: Diagnosis not present

## 2024-02-10 DIAGNOSIS — J9600 Acute respiratory failure, unspecified whether with hypoxia or hypercapnia: Secondary | ICD-10-CM | POA: Diagnosis not present

## 2024-02-10 DIAGNOSIS — R570 Cardiogenic shock: Secondary | ICD-10-CM | POA: Diagnosis not present

## 2024-02-10 DIAGNOSIS — J189 Pneumonia, unspecified organism: Secondary | ICD-10-CM | POA: Diagnosis not present

## 2024-02-10 LAB — BASIC METABOLIC PANEL WITH GFR
Anion gap: 10 (ref 5–15)
BUN: 57 mg/dL — ABNORMAL HIGH (ref 8–23)
CO2: 26 mmol/L (ref 22–32)
Calcium: 9.3 mg/dL (ref 8.9–10.3)
Chloride: 96 mmol/L — ABNORMAL LOW (ref 98–111)
Creatinine, Ser: 0.93 mg/dL (ref 0.44–1.00)
GFR, Estimated: >60 mL/min (ref >60)
Glucose, Bld: 162 mg/dL — ABNORMAL HIGH (ref 70–99)
Potassium: 3.6 mmol/L (ref 3.5–5.1)
Sodium: 132 mmol/L — ABNORMAL LOW (ref 135–145)

## 2024-02-10 LAB — GLUCOSE, CAPILLARY
Glucose-Capillary: 146 mg/dL — ABNORMAL HIGH (ref 70–99)
Glucose-Capillary: 149 mg/dL — ABNORMAL HIGH (ref 70–99)
Glucose-Capillary: 153 mg/dL — ABNORMAL HIGH (ref 70–99)
Glucose-Capillary: 175 mg/dL — ABNORMAL HIGH (ref 70–99)
Glucose-Capillary: 204 mg/dL — ABNORMAL HIGH (ref 70–99)
Glucose-Capillary: 213 mg/dL — ABNORMAL HIGH (ref 70–99)

## 2024-02-10 LAB — CBC
HCT: 35.2 % — ABNORMAL LOW (ref 36.0–46.0)
Hemoglobin: 11.3 g/dL — ABNORMAL LOW (ref 12.0–15.0)
MCH: 26.8 pg (ref 26.0–34.0)
MCHC: 32.1 g/dL (ref 30.0–36.0)
MCV: 83.6 fL (ref 80.0–100.0)
Platelets: 325 10*3/uL (ref 150–400)
RBC: 4.21 MIL/uL (ref 3.87–5.11)
RDW: 16.4 % — ABNORMAL HIGH (ref 11.5–15.5)
WBC: 17.5 10*3/uL — ABNORMAL HIGH (ref 4.0–10.5)
nRBC: 0 % (ref 0.0–0.2)

## 2024-02-10 MED ORDER — ZINC SULFATE 220 (50 ZN) MG PO CAPS
220.0000 mg | ORAL_CAPSULE | Freq: Every day | ORAL | Status: DC
  Administered 2024-02-10 – 2024-02-13 (×4): 220 mg via ORAL
  Filled 2024-02-10 (×4): qty 1

## 2024-02-10 MED ORDER — ADULT MULTIVITAMIN W/MINERALS CH
1.0000 | ORAL_TABLET | Freq: Every day | ORAL | Status: DC
  Administered 2024-02-10 – 2024-02-13 (×4): 1 via ORAL
  Filled 2024-02-10 (×4): qty 1

## 2024-02-10 MED ORDER — VITAMIN C 500 MG PO TABS
500.0000 mg | ORAL_TABLET | Freq: Two times a day (BID) | ORAL | Status: DC
  Administered 2024-02-10 – 2024-02-13 (×7): 500 mg via ORAL
  Filled 2024-02-10 (×7): qty 1

## 2024-02-10 NOTE — Discharge Instructions (Signed)
 Information on my medicine - ELIQUIS  (apixaban )   Why was Eliquis  prescribed for you? Eliquis  was prescribed for you to reduce the risk of forming blood clots that can cause a stroke if you have a medical condition called atrial fibrillation (a type of irregular heartbeat).  What do You need to know about Eliquis  ? Take your Eliquis  5 mg TWICE DAILY - one tablet in the morning and one tablet in the evening with or without food.  It would be best to take the doses about the same time each day.  If you have difficulty swallowing the tablet whole please discuss with your pharmacist how to take the medication safely.  Take Eliquis  exactly as prescribed by your doctor and DO NOT stop taking Eliquis  without talking to the doctor who prescribed the medication.  Stopping may increase your risk of developing a new clot or stroke.  Refill your prescription before you run out.  After discharge, you should have regular check-up appointments with your healthcare provider that is prescribing your Eliquis .  In the future your dose may need to be changed if your kidney function or weight changes by a significant amount or as you get older.  What do you do if you miss a dose? If you miss a dose, take it as soon as you remember on the same day and resume taking twice daily.  Do not take more than one dose of ELIQUIS  at the same time.  Important Safety Information A possible side effect of Eliquis  is bleeding. You should call your healthcare provider right away if you experience any of the following: Bleeding from an injury or your nose that does not stop. Unusual colored urine (red or dark brown) or unusual colored stools (red or black). Unusual bruising for unknown reasons. A serious fall or if you hit your head (even if there is no bleeding).  Some medicines may interact with Eliquis  and might increase your risk of bleeding or clotting while on Eliquis . To help avoid this, consult your healthcare  provider or pharmacist prior to using any new prescription or non-prescription medications, including herbals, vitamins, non-steroidal anti-inflammatory drugs (NSAIDs) and supplements.  This website has more information on Eliquis  (apixaban ): [email or web address] Tanya Frye

## 2024-02-10 NOTE — Progress Notes (Signed)
 Nutrition Follow-up  DOCUMENTATION CODES:   Obesity unspecified  INTERVENTION:   -Continue Ensure Enlive po BID, each supplement provides 350 kcal and 20 grams of protein.  -MVI with minerals daily -500 mg vitamin C BID -220 mg zinc sulfate daily x 14 days -Magic cup TID with meals, each supplement provides 290 kcal and 9 grams of protein  - Feeding assistance with meals   NUTRITION DIAGNOSIS:   Inadequate oral intake related to inability to eat (pt sedated and ventilated) as evidenced by NPO status.  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 Fib w/ RVR and AKI.  5/16- extubated 5/17- s/p BSE- dysphagia 1 diet with thin liquids 5/18- s/p BSE- dysphagia 1 diet with thin liquids 5/149- s/p BSE- dysphagia 2 diet with thin liquids  Reviewed I/O's: -1.9 L x 24 hours and -6.5 L since admission  UOP: 2 L x 24 hours  Pt lying in bed at time of visit. No family at bedside. Pt drowsy, offered no complaints other than requiring feeding assistance. Meal tray on tray table, unattempted.   Per palliative care notes, pt underwent one way extubation on 02/06/24. Pt is a DNR and would not like trach or PEG.   Reviewed wts; pt has experienced a 21.7% wt loss over the past week, which is significant for time frame. Suspect diuresis is contributing to weight loss. Noted pt -6.5 L since admission.   Per TOC notes, plan for SNF placement at discharge.   Medications reviewed and include synthroid , protonix , aldactone , and  demadex .  Labs reviewed: CBGS: 121-218 (inpatient orders for glycemic control are 0-20 units insulun aspart every 4 hours and 10 units insulin  aspart BID).    NUTRITION - FOCUSED PHYSICAL EXAM:  Flowsheet Row Most Recent Value  Orbital Region Mild depletion  Upper Arm Region Mild depletion  Thoracic and Lumbar Region No depletion  Buccal Region Mild depletion  Temple Region Mild depletion  Clavicle Bone Region No depletion  Clavicle and Acromion Bone Region No depletion  Scapular Bone Region No depletion  Dorsal Hand No depletion  Patellar Region No depletion  Anterior Thigh Region No depletion  Posterior Calf Region No depletion  Edema (RD Assessment) Mild  Hair Reviewed  Eyes Reviewed  Mouth Reviewed  Skin Reviewed  Nails Reviewed       Diet Order:   Diet Order             DIET DYS 2 Room service appropriate? Yes; Fluid consistency: Thin  Diet effective now                   EDUCATION NEEDS:   No education needs have been identified at this time  Skin:  Skin Assessment: Skin Integrity Issues: Skin Integrity Issues:: Stage II Stage II: sacrum and buttocks  Last BM:  02/10/24 (type 6)  Height:   Ht Readings from Last 1 Encounters:  02/03/24 5' 4.02" (1.626 m)    Weight:   Wt Readings from Last 1 Encounters:  02/10/24 77.1 kg    Ideal Body Weight:  54.5  kg  BMI:  Body mass index is 29.16 kg/m.  Estimated Nutritional Needs:   Kcal:  1650-1850  Protein:  85-100 grams  Fluid:  1.6-1.8 L    Herschel Lords, RD, LDN, CDCES Registered Dietitian III Certified Diabetes Care and Education Specialist If unable to reach this RD, please use "RD Inpatient" group chat on secure chat between hours of 8am-4 pm daily

## 2024-02-10 NOTE — Progress Notes (Signed)
 PROGRESS NOTE   HPI was taken from NP [name]: Tanya Frye is a 83 year old female with a past medical history significant for COPD, CHF,  hypertension, diabetes mellitus, and hard of hearing who presents to [facility] ED on 01/28/2024 due to shortness of breath.  Patient is currently unable to contribute to history due to acute respiratory distress, BiPAP, and being extremely hard of hearing, therefore history is obtained from chart review.   Per report she acutely developed respiratory distress earlier this morning.  Upon EMS arrival she was noted to be hypoxic with O2 saturations of 90% on room air which they placed her on CPAP en route to the hospital.  Given her respiratory distress upon arrival, she was transitioned to BiPAP.     ED Course: Initial Vital Signs: Temperature 96.9 F axillary, pulse 128, respiratory rate 33, blood pressure 146/119, SpO2 99% on BiPAP Significant Labs: Sodium 134, glucose 256, BUN 11, creatinine 1.1, BNP 251, lactic acid 4.8, high-sensitivity troponin 8, WBC 22.8 with neutrophilia VBG: pH 7.24/pCO2 51/pO2 58/bicarb 21.9 COVID-19/flu/RSV PCR is negative Imaging Chest X-ray>>IMPRESSION: 1. Mild cardiomegaly and vascular congestion. 2. Small bilateral pleural effusions and bibasilar atelectasis or infiltrate. Medications Administered: 2.5 L of LR boluses, IV azithromycin  and ceftriaxone , 0.5 mg IV Ativan , 4 mg morphine , 5 mg IV Cardizem    Following fluid resuscitation in the ED, systolic blood pressure decreasing to the 70s and 80s requiring initiation of peripheral Levophed .   PCCM asked to admit for further workup and treatment.     Tanya Frye  UEA:[id number] DOB: 1941/06/27 DOA: 01/28/2024 PCP: Pcp, No   Assessment & Plan:   Principal Problem:   Cardiogenic shock (HCC) Active Problems:   Acute respiratory failure (HCC)   Sepsis (HCC)   Dilated cardiomyopathy (HCC)   Non-STEMI (non-ST elevated myocardial infarction) (HCC)   Community acquired pneumonia    Atrial fibrillation with RVR (HCC)   Pressure injury of skin  Assessment and Plan:  Shock: cardiogenic & septic. See NP Keene's note on how pt met shock criteria. Weaned off of pressors. D/c milirinone drip as per cardio. Continue on aldactone , losartan , amio, & torsemide  as per cardio. Continue on tele. Poor prognosis & high risk for further deterioration. Cardio following and recs apprec   Acute metabolic encephalopathy: continue w/ supportive care. Awake & oriented to self, place.   Acute systolic CHF: decompensated.  Continue on torsemide , losartan , aldactone  as per cardio. Monitor I/Os. Poor prognosis & high risk for further deterioration. Cardio following and recs apprec  HTN: continue on losartan , aldactone    A. fib: w/ RVR. New onset. Continue on amio, eliquis . Cardio recs apprecs   Acute hypoxic respiratory failure: s/p intubation, ventilation & extubation. Continue on supplemental oxygen and wean as tolerated   Possible CAP: completed abx course.  Continue on bronchodilators   B/l pleural effusions: continue on torsemide    AKI: resolved   Hypokalemia: WNL today   Metabolic alkalosis: secondary to diuresis. D/c diamox  as per cardio  DM2: well controlled, HbA1c 6.6. Continue on glargine, SSI w/ accuchecks   Hypothyroidism: continue on levothyroxine    Obesity: BMI 30.6. Complicates overall care & prognosis       DVT prophylaxis: eliquis   Code Status: DNR/DNI  Family Communication:  Disposition Plan: unclear  Level of care: Progressive Consultants:  ICU Cardio   Procedures:   Antimicrobials:    Subjective: Pt c/o fatigue   Objective: Vitals:   02/10/24 0200 02/10/24 0319 02/10/24 0500 02/10/24 0811  BP:  (!) 110/59  [name])  112/54  Pulse:  87  93  Resp: 16 18    Temp:  (!) 97.3 F (36.3 C)  97.7 F (36.5 C)  TempSrc:    Oral  SpO2:  99%  99%  Weight:   77.1 kg   Height:        Intake/Output Summary (Last 24 hours) at 02/10/2024 0825 Last data  filed at 02/10/2024 0656 Gross per 24 hour  Intake 150 ml  Output 1750 ml  Net -1600 ml   Filed Weights   02/08/24 0500 02/09/24 0447 02/10/24 0500  Weight: 79.1 kg 79.2 kg 77.1 kg    Examination:  General exam: appears calm but uncomfortable & confused Respiratory system: diminished breath sounds b/l Cardiovascular system: S1/S2+. No rubs or clicks  Gastrointestinal system: abd is soft, NT, ND & hypoactive bowel sounds  Central nervous system: alert & awake Psychiatry: judgement and insight appears poor. Flat mood and affect    Data Reviewed: I have personally reviewed following labs and imaging studies  CBC: Recent Labs  Lab 02/06/24 0412 02/07/24 0543 02/08/24 0507 02/09/24 0433 02/10/24 0440  WBC 17.2* 15.1* 14.9* 13.3* 17.5*  HGB 11.5* 11.6* 11.5* 11.4* 11.3*  HCT 37.2 36.7 36.2 35.8* 35.2*  MCV 85.7 84.8 84.6 84.6 83.6  PLT 360 331 339 318 325   Basic Metabolic Panel: Recent Labs  Lab 02/04/24 1558 02/05/24 0314 02/06/24 0409 02/06/24 0412 02/06/24 1228 02/07/24 0543 02/08/24 0507 02/09/24 0433 02/10/24 0440  NA 136 135 133*  --  132* 134* 135 133* 132*  K 3.8 4.1 3.1*  --  4.6 3.2* 3.2* 3.2* 3.6  CL 95* 90* 82*  --  84* 87* 92* 95* 96*  CO2 29 34* 34*  --  33* 33* 31 27 26   GLUCOSE 154* 192* 262*  --  191* 173* 106* 147* 162*  BUN 60* 69* 74*  --  79* 89* 79* 65* 57*  CREATININE 1.14* 1.18* 1.25*  --  1.22* 1.32* 1.24* 0.99 0.93  CALCIUM 9.2 10.3 11.0*  --  11.1* 10.5* 10.2 9.7 9.3  MG 1.8 2.2  --  1.8  --  2.3 2.1  --   --   PHOS 4.5 6.4* 7.8*  --  6.6* 6.1*  --   --   --    GFR: Estimated Creatinine Clearance: 47.7 mL/min (by C-G formula based on SCr of 0.93 mg/dL). Liver Function Tests: Recent Labs  Lab 02/04/24 1558 02/05/24 0314 02/06/24 0409 02/06/24 1228 02/07/24 0543  ALBUMIN 3.0* 3.2* 3.6 3.8 3.7   No results for input(s): "LIPASE", "AMYLASE" in the last 168 hours. No results for input(s): "AMMONIA" in the last 168  hours. Coagulation Profile: No results for input(s): "INR", "PROTIME" in the last 168 hours. Cardiac Enzymes: No results for input(s): "CKTOTAL", "CKMB", "CKMBINDEX", "TROPONINI" in the last 168 hours. BNP (last 3 results) No results for input(s): "PROBNP" in the last 8760 hours. HbA1C: No results for input(s): "HGBA1C" in the last 72 hours. CBG: Recent Labs  Lab 02/09/24 1614 02/09/24 2032 02/10/24 0015 02/10/24 0320 02/10/24 0809  GLUCAP 230* 218* 153* 149* 146*   Lipid Profile: No results for input(s): "CHOL", "HDL", "LDLCALC", "TRIG", "CHOLHDL", "LDLDIRECT" in the last 72 hours.  Thyroid  Function Tests: No results for input(s): "TSH", "T4TOTAL", "FREET4", "T3FREE", "THYROIDAB" in the last 72 hours. Anemia Panel: No results for input(s): "VITAMINB12", "FOLATE", "FERRITIN", "TIBC", "IRON", "RETICCTPCT" in the last 72 hours. Sepsis Labs: Recent Labs  Lab 02/04/24 0356  LATICACIDVEN 1.3  No results found for this or any previous visit (from the past 240 hours).        Radiology Studies: No results found.       Scheduled Meds:  amiodarone   200 mg Oral BID   apixaban   5 mg Oral BID   aspirin   81 mg Oral Daily   Chlorhexidine  Gluconate Cloth  6 each Topical Daily   feeding supplement  237 mL Oral BID BM   insulin  aspart  0-20 Units Subcutaneous Q4H   insulin  glargine-yfgn  10 Units Subcutaneous BID   levothyroxine   75 mcg Oral Q0600   losartan   12.5 mg Oral Daily   pantoprazole   40 mg Oral Daily   QUEtiapine   25 mg Oral BID   sodium chloride  flush  10-40 mL Intracatheter Q12H   spironolactone   25 mg Oral Daily   torsemide   40 mg Oral Daily   Continuous Infusions:  dextrose        LOS: 13 days       Alphonsus Jeans, MD [facility] Pager [phone]  If 7PM-7AM, please contact night-coverage [email or web address] 02/10/2024, 8:25 AM

## 2024-02-10 NOTE — Plan of Care (Signed)
  Problem: Education: Goal: Knowledge of General Education information will improve Description: Including pain rating scale, medication(s)/side effects and non-pharmacologic comfort measures Outcome: Progressing   Problem: Health Behavior/Discharge Planning: Goal: Ability to manage health-related needs will improve Outcome: Progressing   Problem: Clinical Measurements: Goal: Ability to maintain clinical measurements within normal limits will improve Outcome: Progressing Goal: Will remain free from infection Outcome: Progressing Goal: Diagnostic test results will improve Outcome: Progressing Goal: Respiratory complications will improve Outcome: Progressing Goal: Cardiovascular complication will be avoided Outcome: Progressing   Problem: Activity: Goal: Risk for activity intolerance will decrease Outcome: Progressing   Problem: Nutrition: Goal: Adequate nutrition will be maintained Outcome: Progressing   Problem: Coping: Goal: Level of anxiety will decrease Outcome: Progressing   Problem: Elimination: Goal: Will not experience complications related to bowel motility Outcome: Progressing Goal: Will not experience complications related to urinary retention Outcome: Progressing   Problem: Pain Managment: Goal: General experience of comfort will improve and/or be controlled Outcome: Progressing   Problem: Safety: Goal: Ability to remain free from injury will improve Outcome: Progressing   Problem: Skin Integrity: Goal: Risk for impaired skin integrity will decrease Outcome: Progressing   Problem: Education: Goal: Ability to describe self-care measures that may prevent or decrease complications (Diabetes Survival Skills Education) will improve Outcome: Progressing Goal: Individualized Educational Video(s) Outcome: Progressing   Problem: Coping: Goal: Ability to adjust to condition or change in health will improve Outcome: Progressing   Problem: Fluid  Volume: Goal: Ability to maintain a balanced intake and output will improve Outcome: Progressing   Problem: Health Behavior/Discharge Planning: Goal: Ability to identify and utilize available resources and services will improve Outcome: Progressing Goal: Ability to manage health-related needs will improve Outcome: Progressing   Problem: Metabolic: Goal: Ability to maintain appropriate glucose levels will improve Outcome: Progressing   Problem: Nutritional: Goal: Maintenance of adequate nutrition will improve Outcome: Progressing Goal: Progress toward achieving an optimal weight will improve Outcome: Progressing   Problem: Skin Integrity: Goal: Risk for impaired skin integrity will decrease Outcome: Progressing   Problem: Tissue Perfusion: Goal: Adequacy of tissue perfusion will improve Outcome: Progressing   Problem: Activity: Goal: Ability to tolerate increased activity will improve Outcome: Progressing   Problem: Respiratory: Goal: Ability to maintain a clear airway and adequate ventilation will improve Outcome: Progressing   Problem: Role Relationship: Goal: Method of communication will improve Outcome: Progressing   Problem: Education: Goal: Ability to describe self-care measures that may prevent or decrease complications (Diabetes Survival Skills Education) will improve Outcome: Progressing Goal: Individualized Educational Video(s) Outcome: Progressing   Problem: Cardiac: Goal: Ability to maintain an adequate cardiac output will improve Outcome: Progressing   Problem: Health Behavior/Discharge Planning: Goal: Ability to identify and utilize available resources and services will improve Outcome: Progressing Goal: Ability to manage health-related needs will improve Outcome: Progressing   Problem: Fluid Volume: Goal: Ability to achieve a balanced intake and output will improve Outcome: Progressing   Problem: Metabolic: Goal: Ability to maintain  appropriate glucose levels will improve Outcome: Progressing   Problem: Nutritional: Goal: Maintenance of adequate nutrition will improve Outcome: Progressing Goal: Maintenance of adequate weight for body size and type will improve Outcome: Progressing   Problem: Respiratory: Goal: Will regain and/or maintain adequate ventilation Outcome: Progressing   Problem: Urinary Elimination: Goal: Ability to achieve and maintain adequate renal perfusion and functioning will improve Outcome: Progressing

## 2024-02-10 NOTE — TOC Progression Note (Signed)
 Transition of Care [facility]) - Progression Note    Patient Details  Name: Tanya Frye MRN: [id number] Date of Birth: 05-27-1941  [facility] of [facility] CM/SW Contact  Baird Bombard, RN Phone Number: 02/10/2024, 3:13 PM  Clinical Narrative:    Attempt to speak with patient regarding bed offers. Patient is sleeping.         Expected Discharge Plan and Services                                               Social Determinants of Health (SDOH) Interventions SDOH Screenings   Food Insecurity: Patient Unable To Answer (02/04/2024)  Housing: Patient Unable To Answer (01/29/2024)  Transportation Needs: Patient Unable To Answer (01/29/2024)  Utilities: Patient Unable To Answer (01/29/2024)  Alcohol Screen: Low Risk  (09/05/2021)  Depression (PHQ2-9): Low Risk  (09/05/2021)  Financial Resource Strain: Low Risk  (09/05/2021)  Physical Activity: Inactive (09/05/2021)  Social Connections: Patient Unable To Answer (01/29/2024)  Stress: No Stress Concern Present (09/05/2021)  Tobacco Use: Low Risk  (01/28/2024)    Readmission Risk Interventions     No data to display

## 2024-02-10 NOTE — Care Management Important Message (Signed)
 Important Message  Patient Details  Name: Tanya Frye MRN: [id number] Date of Birth: March 11, 1941   Important Message Given:  Yes - Medicare IM     Tahni Porchia W, CMA 02/10/2024, 11:52 AM

## 2024-02-10 NOTE — Progress Notes (Signed)
   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 hearing aids in)  Conversation partners present during encounter Nurse;Physician;Other (comment) (NP will meet w/Daughter in Law tomorrow to work on AD)  Referral source Family  Reason for visit Advance directives  OnCall Visit Yes  Spiritual Framework  Presenting Themes Goals in life/care;Values and beliefs;Coping tools;Impactful experiences and emotions;Other (comment) (for Family)  Family Stress Factors Exhausted  Interventions  Spiritual Care Interventions Made Established relationship of care and support;Compassionate presence;Reflective listening;Decision-making support/facilitation;Encouragement  Intervention Outcomes  Outcomes Connection to spiritual care;Awareness around self/spiritual resourses;Awareness of support;Patient family open to resources  Advance Directives (For Healthcare)  Does Patient Have a Medical Advance Directive? No  Would patient like information on creating a medical advance directive? Yes (Inpatient - patient defers creating a medical advance directive at this time - Information given)

## 2024-02-10 NOTE — Progress Notes (Signed)
 Advanced Heart Failure Rounding Note  Cardiologist: Belva Boyden, MD  Chief Complaint:  Subjective:    - Transferred out of the ICU yesterday; eating breakfast today. Very weak but making slow progress.  - 2L urine output q24h; euvolemic on exam.   Objective:   Weight Range: 77.1 kg Body mass index is 29.16 kg/m.   Vital Signs:   Temp:  [97.3 F (36.3 C)-98.4 F (36.9 C)] 97.7 F (36.5 C) (05/20 0811) Pulse Rate:  [53-98] 93 (05/20 0811) Resp:  [15-20] 18 (05/20 0319) BP: (91-113)/(54-76) 112/54 (05/20 0811) SpO2:  [97 %-100 %] 99 % (05/20 0811) Weight:  [77.1 kg] 77.1 kg (05/20 0500) Last BM Date : 02/09/24  Weight change: Filed Weights   02/08/24 0500 02/09/24 0447 02/10/24 0500  Weight: 79.1 kg 79.2 kg 77.1 kg    Intake/Output:   Intake/Output Summary (Last 24 hours) at 02/10/2024 1029 Last data filed at 02/10/2024 0900 Gross per 24 hour  Intake 150 ml  Output 1750 ml  Net -1600 ml      Physical Exam    Vitals:   02/10/24 0319 02/10/24 0811  BP: (!) 110/59 (!) 112/54  Pulse: 87 93  Resp: 18   Temp: (!) 97.3 F (36.3 C) 97.7 F (36.5 C)  SpO2: 99% 99%   GENERAL: NAD Lungs- normal work of breathing CARDIAC:  JVP: 7 cm          Normal rate with regular rhythm. no murmur.  Pulses 1+. no edema.  ABDOMEN: Soft, non-tender, non-distended.  EXTREMITIES: Warm and well perfused.  NEUROLOGIC: No obvious FND     Medications:     Scheduled Medications:  amiodarone   200 mg Oral BID   apixaban   5 mg Oral BID   vitamin C  500 mg Oral BID   aspirin   81 mg Oral Daily   Chlorhexidine  Gluconate Cloth  6 each Topical Daily   feeding supplement  237 mL Oral BID BM   insulin  aspart  0-20 Units Subcutaneous Q4H   insulin  glargine-yfgn  10 Units Subcutaneous BID   levothyroxine   75 mcg Oral Q0600   losartan   12.5 mg Oral Daily   multivitamin with minerals  1 tablet Oral Daily   pantoprazole   40 mg Oral Daily   QUEtiapine   25 mg Oral BID   sodium  chloride flush  10-40 mL Intracatheter Q12H   spironolactone   25 mg Oral Daily   torsemide   40 mg Oral Daily   zinc sulfate (50mg  elemental zinc)  220 mg Oral Daily    Infusions:  dextrose       PRN Medications: dextrose , dextrose , docusate sodium , mouth rinse, polyethylene glycol, sodium chloride  flush    Patient Profile   83 y.o. female with history of obesity, DM II, HTN, HOH, hypothyroidism, gastric cancer in remission. 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 CT chest from admission with diffuse calcifications in the LAD/Lcx. No urgent indication for cath at this time. She is very frail at baseline with likely a poor 1 year prognosis. Will manage medically.  - 2L urine output over the past 24h on torsemide  40mg  PO daily; will continue for the time being.  -  Continue spironolactone  25mg  daily, losartan  12.5mg  daily. Will plan to start toprol  12.5mg  at bedtime tomorrow.    Afib with RVR - Newly diagnosed. Suspect a consequence of ongoing shock and new HF - TSH okay. On synthroid  at home - Rate controlled in the 90s on amiodarone  200mg  BID. Will transfer to floor. She can come back for outpatient cardioversion after 3 weeks of anticoagulation. Will avoid TEE.  -  Discussed above plan with family at bedside; they are agreeable with outpatient cardioversion & avoidance of further procedures for the time being.  - continue apixaban  5mg  BID;    Acute respiratory failure -resolved; on room air today.    DM II - A1c 6.7% - Per primary    Medication concerns reviewed with patient and pharmacy team. Barriers identified: Anticoagulation, inotropes, high  dose diuretics  Length of Stay: 13  Dajon Rowe, DO  02/10/2024, 10:29 AM  Advanced Heart Failure Team Pager [phone] (M-F; 7a - 5p)  Please contact [facility] for night-coverage after hours (5p -7a ) and weekends on [email or web address]

## 2024-02-10 NOTE — Progress Notes (Addendum)
 SLP F/U Note  Patient Details Name: Tanya Frye MRN: [id number] DOB: 05-06-1941   F/U note:   Reason: chart reiviewed; met w/ Family/NSG re: diet consistency concern w/ level 2 consistency. Diet downgraded to a Dysphagia level 1 consistency post discussion of pt's presentation; Family/NSG agreement. ST services will f/u tomorrow at a meal for further education.      Darla Edward, MS, CCC-SLP Speech Language Pathologist Rehab Services; [facility] [phone] (ascom) Elese Rane 02/10/2024, 5:33 PM

## 2024-02-10 NOTE — Progress Notes (Addendum)
 Palliative Care Progress Note, Assessment & Plan   Patient Name: Tanya Frye       Date: 02/10/2024 DOB: 01-28-1941  Age: 83 y.o. MRN#: [id number] Attending Physician: Alphonsus Jeans, MD Primary Care Physician: Pcp, No Admit Date: 01/28/2024  Subjective: Patient is lying in bed, resting.  She is asleep but easily awakens to my presence.  She tracks me with her eyes but makes no vocalizations during my visit.  Her daughter in law [name] is at bedside.  [name] and I stepped outside of the room at patient's request for rest.  HPI: 83 y.o. female  with past medical history of COPD, CHF, HTN, type 2 diabetes, and HOH admitted on 01/28/2024 with acute respiratory distress requiring BiPAP.   Upon arrival to ED, patient found to be in A-fib with RVR and was given amiodarone .  Additionally, patient developed sepsis and cardiogenic shock requiring pressor support.  PCCM was consulted and later that evening patient required intubation.   Patient remains critically ill with multiorgan failure supported by 1 vasopressor. Leukocytosis has improved from 18.7-> 11.7 in the past 24 hours. LVEF on echo 25-30% with global hypokinesis.   PMT was consulted to support patient and family with goals of care discussions.  Summary of counseling/coordination of care: Extensive chart review completed prior to meeting patient including labs, vital signs, imaging, progress notes, orders, and available advanced directive documents from current and previous encounters.   After reviewing the patient's chart and assessing the patient at bedside, I honored patient's request to be left alone to rest.  I met with patient's daughter-in-law Jodie just outside of the room.   We discussed patient's current functional, nutritional, and cognitive  status.  [name] shares concerns that patient was not fed her breakfast.  Discussed that we will remind staff that patient needs assistance with eating.  Additionally, SLP is to reevaluate patient since [name] shares patient was having some difficulty with dysphagia diet.  Discussed with SLP and they plan to re-evaluate.   Additionally, Jodie shares she would like to complete an advanced directive.  Chaplain joined our discussion.  Discussed importance of advanced care planning documentation.    According to [place] law, in the absence of an HCPOA, sister would be patient's next of kin.  However, sister has declined being a Runner, broadcasting/film/video for patient.  Patient has no other living siblings, is a widow, and has no living children.  Therefore, next in line to be patient's surrogate decision maker would be an individual that 1) has an established relationship with the patient 2) who is acting in good faith on behalf of the patient 3) who can reliably convey the patient's wishes. Jodie is willing and able to be patient's decision maker if/when patient has capacity to name Jodie as her surrogate decision maker.  Additionally, I discussed that it would be important to know patient's wishes and boundaries of care. So, not only can patient name her next of kin decision maker in her advanced directive, but patient can also make her medical wishes known.  Discussed documenting patient's wishes prior to an emergency or urgent event can lift the burden off of Jodie/family for guessing what patient may or  may not want for future medical decisions.  We discussed that patient has requested to be left alone because she is tired today.  We agreed to meet bedside tomorrow, Wednesday, May 21, at 10:30 AM to continue goals of care discussions and hopefully complete advance directive with patient. Jodie plans to be bedside at that time.   No adjustment to [facility] or plan of care at this time.  Physical Exam Vitals reviewed.   HENT:     Head: Normocephalic.     Mouth/Throat:     Mouth: Mucous membranes are moist.     Comments: Missing teeth Eyes:     Pupils: Pupils are equal, round, and reactive to light.  Pulmonary:     Effort: Pulmonary effort is normal.  Abdominal:     Palpations: Abdomen is soft.  Musculoskeletal:     Comments: Generalized weakness  Skin:    General: Skin is warm and dry.  Neurological:     Mental Status: She is alert.  Psychiatric:        Mood and Affect: Mood normal.        Behavior: Behavior normal.             Total Time 50 minutes   Time spent includes: Detailed review of medical records (labs, imaging, vital signs), medically appropriate exam (mental status, respiratory, cardiac, skin), discussed with treatment team, counseling and educating patient, family and staff, documenting clinical information, medication management and coordination of care.  Judeen Nose L. Rebbeca Campi, DNP, FNP-BC Palliative Medicine Team

## 2024-02-11 DIAGNOSIS — J9600 Acute respiratory failure, unspecified whether with hypoxia or hypercapnia: Secondary | ICD-10-CM | POA: Diagnosis not present

## 2024-02-11 DIAGNOSIS — I214 Non-ST elevation (NSTEMI) myocardial infarction: Secondary | ICD-10-CM | POA: Diagnosis not present

## 2024-02-11 DIAGNOSIS — R570 Cardiogenic shock: Secondary | ICD-10-CM | POA: Diagnosis not present

## 2024-02-11 DIAGNOSIS — A419 Sepsis, unspecified organism: Secondary | ICD-10-CM | POA: Diagnosis not present

## 2024-02-11 LAB — CBC
HCT: 37.2 % (ref 36.0–46.0)
Hemoglobin: 12.1 g/dL (ref 12.0–15.0)
MCH: 27.1 pg (ref 26.0–34.0)
MCHC: 32.5 g/dL (ref 30.0–36.0)
MCV: 83.4 fL (ref 80.0–100.0)
Platelets: 373 10*3/uL (ref 150–400)
RBC: 4.46 MIL/uL (ref 3.87–5.11)
RDW: 16.5 % — ABNORMAL HIGH (ref 11.5–15.5)
WBC: 21.6 10*3/uL — ABNORMAL HIGH (ref 4.0–10.5)
nRBC: 0 % (ref 0.0–0.2)

## 2024-02-11 LAB — GLUCOSE, CAPILLARY
Glucose-Capillary: 122 mg/dL — ABNORMAL HIGH (ref 70–99)
Glucose-Capillary: 175 mg/dL — ABNORMAL HIGH (ref 70–99)
Glucose-Capillary: 184 mg/dL — ABNORMAL HIGH (ref 70–99)
Glucose-Capillary: 195 mg/dL — ABNORMAL HIGH (ref 70–99)
Glucose-Capillary: 244 mg/dL — ABNORMAL HIGH (ref 70–99)
Glucose-Capillary: 271 mg/dL — ABNORMAL HIGH (ref 70–99)

## 2024-02-11 LAB — BASIC METABOLIC PANEL WITH GFR
Anion gap: 14 (ref 5–15)
BUN: 53 mg/dL — ABNORMAL HIGH (ref 8–23)
CO2: 24 mmol/L (ref 22–32)
Calcium: 9.7 mg/dL (ref 8.9–10.3)
Chloride: 96 mmol/L — ABNORMAL LOW (ref 98–111)
Creatinine, Ser: 0.92 mg/dL (ref 0.44–1.00)
GFR, Estimated: >60 mL/min (ref >60)
Glucose, Bld: 220 mg/dL — ABNORMAL HIGH (ref 70–99)
Potassium: 3.5 mmol/L (ref 3.5–5.1)
Sodium: 134 mmol/L — ABNORMAL LOW (ref 135–145)

## 2024-02-11 MED ORDER — POTASSIUM CHLORIDE 20 MEQ PO PACK
40.0000 meq | PACK | Freq: Once | ORAL | Status: AC
  Administered 2024-02-11: 40 meq via ORAL
  Filled 2024-02-11: qty 2

## 2024-02-11 MED ORDER — INSULIN ASPART 100 UNIT/ML IJ SOLN
0.0000 [IU] | Freq: Three times a day (TID) | INTRAMUSCULAR | Status: DC
  Administered 2024-02-12 (×2): 4 [IU] via SUBCUTANEOUS
  Administered 2024-02-12 – 2024-02-13 (×2): 7 [IU] via SUBCUTANEOUS
  Administered 2024-02-13: 4 [IU] via SUBCUTANEOUS
  Filled 2024-02-11 (×5): qty 1

## 2024-02-11 NOTE — Progress Notes (Signed)
 Occupational Therapy Treatment Patient Details Name: Tanya Frye MRN: [id number] DOB: September 01, 1941 Today's Date: 02/11/2024   History of present illness 83 y/o female presented to ED on 01/28/24 for SOB. Intubated 5/7-5/16 (1 way extubation). Admitted with septic shock with progressive multi organ failure. PMH: COPD, CHF, HTN, T2DM   OT comments  Upon entering the room, pt supine in bed with daughter in law present in room. Pt needing increased time to initiate and sequence. She was able to wash face with increased time this session. TV turned off in room to limit distractions. Max A for supine >sit and pt needing min A to min guard for static sitting balance. Pt stands with heavy max A for ~ 30 seconds and begins urinating. Pt returning to supine with total A. Call bell and all needed items within reach upon exiting the room.       If plan is discharge home, recommend the following:  Two people to help with walking and/or transfers;Two people to help with bathing/dressing/bathroom;Assistance with cooking/housework;Assistance with feeding;Help with stairs or ramp for entrance;Assist for transportation;Direct supervision/assist for financial management;Direct supervision/assist for medications management;Supervision due to cognitive status   Equipment Recommendations  Other (comment)       Precautions / Restrictions Precautions Precautions: Fall Recall of Precautions/Restrictions: Impaired       Mobility Bed Mobility Overal bed mobility: Needs Assistance Bed Mobility: Supine to Sit, Sit to Supine     Supine to sit: Max assist Sit to supine: Total assist        Transfers Overall transfer level: Needs assistance Equipment used: 1 person hand held assist Transfers: Sit to/from Stand Sit to Stand: Max assist                 Balance Overall balance assessment: Needs assistance Sitting-balance support: Bilateral upper extremity supported, Feet unsupported Sitting  balance-Leahy Scale: Fair     Standing balance support: Reliant on assistive device for balance, No upper extremity supported, Bilateral upper extremity supported                               ADL either performed or assessed with clinical judgement   ADL Overall ADL's : Needs assistance/impaired     Grooming: Wash/dry hands;Wash/dry face;Sitting;Supervision/safety;Set up                                      Extremity/Trunk Assessment Upper Extremity Assessment Upper Extremity Assessment: Generalized weakness   Lower Extremity Assessment Lower Extremity Assessment: Generalized weakness   Cervical / Trunk Assessment Cervical / Trunk Assessment: Kyphotic    Vision Patient Visual Report: No change from baseline           Communication Communication Communication: Impaired Factors Affecting Communication: Hearing impaired   Cognition Arousal: Alert Behavior During Therapy: Flat affect Cognition: Cognition impaired Difficult to assess due to: Impaired communication Orientation impairments: Place, Time, Situation Awareness: Intellectual awareness impaired, Online awareness impaired   Attention impairment (select first level of impairment): Focused attention Executive functioning impairment (select all impairments): Organization, Sequencing, Reasoning, Problem solving                   Following commands: Impaired Following commands impaired: Follows one step commands inconsistently, Follows one step commands with increased time      Cueing   Cueing Techniques: Verbal cues, Tactile cues  Pertinent Vitals/ Pain       Pain Assessment Pain Assessment: Faces Faces Pain Scale: Hurts even more Pain Location: generalized "all over" Pain Descriptors / Indicators: Aching, Discomfort Pain Intervention(s): Limited activity within patient's tolerance, Monitored during session, Repositioned         Frequency  Min 2X/week         Progress Toward Goals  OT Goals(current goals can now be found in the care plan section)  Progress towards OT goals: Progressing toward goals      AM-PAC OT "6 Clicks" Daily Activity     Outcome Measure   Help from another person eating meals?: Total Help from another person taking care of personal grooming?: Total Help from another person toileting, which includes using toliet, bedpan, or urinal?: Total Help from another person bathing (including washing, rinsing, drying)?: Total Help from another person to put on and taking off regular upper body clothing?: Total Help from another person to put on and taking off regular lower body clothing?: Total 6 Click Score: 6    End of Session Equipment Utilized During Treatment: Oxygen  OT Visit Diagnosis: Other abnormalities of gait and mobility (R26.89);Muscle weakness (generalized) (M62.81)   Activity Tolerance Patient tolerated treatment well   Patient Left in bed;with call bell/phone within reach;with bed alarm set;with family/visitor present   Nurse Communication Mobility status        Time: 1610-9604 OT Time Calculation (min): 17 min  Charges: OT General Charges $OT Visit: 1 Visit OT Treatments $Self Care/Home Management : 8-22 mins  George Kinder, MS, OTR/L , CBIS ascom [phone]  02/11/24, 1:30 PM

## 2024-02-11 NOTE — Progress Notes (Signed)
 Palliative Care Progress Note, Assessment & Plan   Patient Name: Tanya Frye       Date: 02/11/2024 DOB: Apr 22, 1941  Age: 83 y.o. MRN#: [id number] Attending Physician: Garrison Kanner, MD Primary Care Physician: Pcp, No Admit Date: 01/28/2024  Subjective: Patient is sitting up in bed.  She is awake and alert.  She acknowledges my presence and is able to make her wishes known.  Her daughter-in-law is at bedside during my visit.  HPI: 83 y.o. female  with past medical history of COPD, CHF, HTN, type 2 diabetes, and HOH admitted on 01/28/2024 with acute respiratory distress requiring BiPAP.   Upon arrival to ED, patient found to be in A-fib with RVR and was given amiodarone .  Additionally, patient developed sepsis and cardiogenic shock requiring pressor support.  PCCM was consulted and later that evening patient required intubation.   Patient remains critically ill with multiorgan failure supported by 1 vasopressor. Leukocytosis has improved from 18.7-> 11.7 in the past 24 hours. LVEF on echo 25-30% with global hypokinesis.   PMT was consulted to support patient and family with goals of care discussions.  Summary of counseling/coordination of care: Extensive chart review completed prior to meeting patient including labs, vital signs, imaging, progress notes, orders, and available advanced directive documents from current and previous encounters.   After reviewing the patient's chart and assessing the patient at bedside, I spoke with patient in regards to symptom management and goals of care.   Symptoms assessed.  Patient shares that she feels tired and does not want to be asked any more questions.  She has no acute physical complaints, such as headache, chest pain, shortness of breath, or nausea/vomiting, at this  time.  No adjustment to [facility] needed.  With patient's permission, I asked to explore who her next of kin would be.  I acknowledge that she does not want to be peppered with more questions but it is important to at least have a surrogate decision maker named in the event that another emergency or urgent event occurs.  Patient shares that in the event that she is unable to speak for herself, she names her daughter in law [name] as her next of kin.  I highlighted that patient has a sister that would be the next in line legally.  However, patient's sister has deferred decisions to [place].  Patient acknowledges this and says that she would prefer [name] over anyone else.  Patient also shared that in the event that [name] is unable to make decisions for her, she would want her grandson Melodee Spruce to be her second next of kin decision maker.  I discussed that patient had been intubated and extubated with success.  However, should patient require intubation again, is that something she would be accepting of.  Patient shares that she would be accepting of intubation but would never want to have a tracheostomy or PEG tube placed.  She remains in agreement with DNR with full interventions.  [name] was present at bedside during this discussion and is in agreement as well.  CODE STATUS changed to  she DNR with full interventions.  Discussed importance of making patient's wishes know not only to her next of kin  and medical team but also in a written document.  Discussed importance of advanced directive.  Patient is ready and willing to complete documentation to make her wishes known.  Discussed with staff that patient has capacity at this time to complete an advanced directive.  Staff has paged on-call chaplain to initiate advanced directive completion.  Space and opportunity provided for [name] to share her thoughts and emotions.  She shares she is going to call [facility] after she visits to further discuss discharge planning.  PMT will  continue to follow and support patient and family throughout her hospitalization.   Physical Exam Vitals reviewed.  Constitutional:      Appearance: She is obese.  HENT:     Head: Normocephalic.     Nose: Nose normal.     Mouth/Throat:     Mouth: Mucous membranes are dry.     Pharynx: Oropharynx is clear.  Eyes:     Pupils: Pupils are equal, round, and reactive to light.  Pulmonary:     Effort: Pulmonary effort is normal.  Abdominal:     Palpations: Abdomen is soft.  Musculoskeletal:     Comments: Generalized weakness, MAETC  Skin:    General: Skin is warm and dry.  Neurological:     Mental Status: She is alert and oriented to person, place, and time.  Psychiatric:        Mood and Affect: Mood normal.        Behavior: Behavior normal.        Thought Content: Thought content normal.        Judgment: Judgment normal.             Total Time 50 minutes   Time spent includes: Detailed review of medical records (labs, imaging, vital signs), medically appropriate exam (mental status, respiratory, cardiac, skin), discussed with treatment team, counseling and educating patient, family and staff, documenting clinical information, medication management and coordination of care.  Judeen Nose L. Rebbeca Campi, DNP, FNP-BC Palliative Medicine Team

## 2024-02-11 NOTE — Progress Notes (Signed)
 Advanced Heart Failure Rounding Note  Cardiologist: Belva Boyden, MD  Chief Complaint:  Subjective:    - Doing well today; eating lunch. Comfortable. More awake.  - 1.1L urine output recorded.   Objective:   Weight Range: 76.8 kg Body mass index is 29.05 kg/m.   Vital Signs:   Temp:  [97.7 F (36.5 C)-98.6 F (37 C)] 97.9 F (36.6 C) (05/21 1044) Pulse Rate:  [53-104] 53 (05/21 1044) Resp:  [18-20] 18 (05/21 1044) BP: (105-131)/(58-91) 105/64 (05/21 1044) SpO2:  [98 %-99 %] 99 % (05/21 1044) Weight:  [76.8 kg] 76.8 kg (05/21 0512) Last BM Date : 02/11/24  Weight change: Filed Weights   02/09/24 0447 02/10/24 0500 02/11/24 0512  Weight: 79.2 kg 77.1 kg 76.8 kg    Intake/Output:   Intake/Output Summary (Last 24 hours) at 02/11/2024 1127 Last data filed at 02/10/2024 2104 Gross per 24 hour  Intake --  Output 1100 ml  Net -1100 ml      Physical Exam    Vitals:   02/11/24 0757 02/11/24 1044  BP: 118/66 105/64  Pulse: 99 (!) 53  Resp: 19 18  Temp: 98.6 F (37 C) 97.9 F (36.6 C)  SpO2: 98% 99%   GENERAL: NAD Lungs- normal work of breathing CARDIAC:  JVP: 5         Normal rate with regular rhythm. no murmur.  Pulses 1+ ABDOMEN: Soft, non-tender, non-distended.  EXTREMITIES: Warm NEUROLOGIC: No obvious FND     Medications:     Scheduled Medications:  amiodarone   200 mg Oral BID   apixaban   5 mg Oral BID   vitamin C  500 mg Oral BID   aspirin   81 mg Oral Daily   feeding supplement  237 mL Oral BID BM   insulin  aspart  0-20 Units Subcutaneous Q4H   insulin  glargine-yfgn  10 Units Subcutaneous BID   levothyroxine   75 mcg Oral Q0600   losartan   12.5 mg Oral Daily   multivitamin with minerals  1 tablet Oral Daily   pantoprazole   40 mg Oral Daily   QUEtiapine   25 mg Oral BID   sodium chloride  flush  10-40 mL Intracatheter Q12H   spironolactone   25 mg Oral Daily   torsemide   40 mg Oral Daily   zinc sulfate (50mg  elemental zinc)  220 mg  Oral Daily    Infusions:  dextrose       PRN Medications: dextrose , dextrose , docusate sodium , mouth rinse, polyethylene glycol, sodium chloride  flush    Patient Profile   83 y.o. female with history of obesity, DM II, HTN, HOH, hypothyroidism, gastric cancer in remission. 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 CT chest from admission with diffuse calcifications in the LAD/Lcx. No urgent indication for cath at this time. She is very frail at baseline with likely a poor 1 year prognosis. Will manage medically.  - 1L urine output over the past 24h; euvolemic on exam.  - AHF will sign off; continue spironolactone , torsemide  40mg  daily & losartan  12.5mg  daily.    Afib with RVR - Newly diagnosed. Suspect a consequence of ongoing shock and new HF - TSH okay. On synthroid  at home - Rate  controlled in the 90s on amiodarone  200mg  BID. Will transfer to floor. She can come back for outpatient cardioversion after 3 weeks of anticoagulation. Will avoid TEE.  -  Discussed above plan with family at bedside; they are agreeable with outpatient cardioversion & avoidance of further procedures for the time being.  - continue apixaban  5mg  BID; plan for outpatient cardioversion; remains in rate controlled atrial fibrillation.   Acute respiratory failure -resolved; on room air today.    DM II - A1c 6.7% - Per primary    Medication concerns reviewed with patient and pharmacy team. Barriers identified: Anticoagulation, inotropes, high dose diuretics  Length of Stay: 14  Tyrese Capriotti, DO  02/11/2024, 11:27 AM  Advanced Heart Failure Team Pager [phone] (M-F; 7a -  5p)  Please contact [facility] for night-coverage after hours (5p -7a ) and weekends on [email or web address]

## 2024-02-11 NOTE — Inpatient Diabetes Management (Signed)
 Inpatient Diabetes Program Recommendations  AACE/ADA: New Consensus Statement on Inpatient Glycemic Control (2015)  Target Ranges:  Prepandial:   less than 140 mg/dL      [facility] postprandial:   less than 180 mg/dL (1-2 hours)      Critically ill patients:  140 - 180 mg/dL   Lab Results  Component Value Date   GLUCAP 244 (H) 02/11/2024   HGBA1C 6.7 (H) 01/30/2024    Review of Glycemic Control  Latest Reference Range & Units 02/10/24 12:40 02/10/24 16:14 02/10/24 20:21 02/11/24 00:32 02/11/24 04:06 02/11/24 07:57 02/11/24 10:44  Glucose-Capillary 70 - 99 mg/dL 578 (H) 469 (H) 629 (H) 195 (H) 271 (H) 122 (H) 244 (H)   Diabetes history: DM 2 Outpatient Diabetes medications:  Metformin  1000 mg bid Current orders for Inpatient glycemic control:  Novolog  0-20 units q 4 hours Semglee  10 units bid  Inpatient Diabetes Program Recommendations:    May consider reducing Novolog  correction to sensitive (0-9 units) tid with meals and HS scale instead of q 4 hours. Also consider adding Novolog  meal coverage 3 units tid with meal (hold if patient eats less than 50% or NPO).   Thanks,  Josefa Ni, RN, BC-ADM Inpatient Diabetes Coordinator Pager [phone]  (8a-5p)

## 2024-02-11 NOTE — Progress Notes (Addendum)
 Speech Language Pathology Treatment: Dysphagia  Patient Details Name: Tanya Frye MRN: [id number] DOB: Apr 19, 1941 Today's Date: 02/11/2024 Time: 1445-1510 SLP Time Calculation (min) (ACUTE ONLY): 25 min  Assessment / Plan / Recommendation Clinical Impression  Pt seen for f/u w/ toleration of downgraded diet -- moved to a Pureed diet consistency yesterday for safer, more effective oral phase management and swallowing during oral intake post Family and NSG report that pt "was having trouble w/ the eggs" -- it was determined that some increased textured foods caused similar oral phase issues. Family in agreement w/ plan yesterday. She was awake and verbally responsive; engaged in po trials w/ this SLP; HOH. Pt followed through adequately given cues. Pt is on RA; afebrile. No Dentures.  Pt explained general aspiration precautions and agreed verbally to the need for following them especially sitting upright for all oral intake -- fully supported behind the back for upright sitting. She was fed trials of purees and thin liquids w/ no overt clinical s/s of aspiration noted w/ the consistencies; respiratory status remained calm and unlabored, vocal quality clear b/t trials, no coughing. Oral phase appeared [facility] for bolus management and timely A-P transfer for swallowing; oral clearing achieved post swallow.  NSG denied any deficits in swallowing w/ the Pureed diet consistency stating pt has been able to help feed herself w/ setup and support at meals.    Pt appears at reduced risk for dysphagia and aspiration when following general aspiration precautions w/ a modified dysphagia diet of Pureed consistency for ease of oral phase management/clearing AND to Maximize oral intake safely; gravies added to moisten foods. Thin liquids. Recommend general aspiration precautions; tray setup and positioning assistance for meals. Support feeding at meals as needed. Continue pills in puree for safer swallowing. General  Reflux precautions.  ST services will sign off at this time w/ MD to reconsult if new needs while admitted. Recommend f/u at SNF for ongoing services to address identified needs. NSG updated, agreed. MD agreed. Precautions posted in room, chart.        HPI HPI: 83 y.o. female with history of obesity, DM II, HTN, HOH, hypothyroidism, gastric cancer in remission. Admitted with profound mixed septic/cardiogenic shock, acute respiratory failure and Afib with RVR. CXR 5/16 "No significant interval change.  2. Persistent bibasilar collapse/consolidation with probable  bilateral pleural effusions.".   Unsure of pt's Cognitive functioning at baseline.      SLP Plan  All goals met      Recommendations for follow up therapy are one component of a multi-disciplinary discharge planning process, led by the attending physician.  Recommendations may be updated based on patient status, additional functional criteria and insurance authorization.    Recommendations  Diet recommendations: Dysphagia 1 (puree);Thin liquid Liquids provided via: Cup;Straw (monitor) Medication Administration: Crushed with puree Supervision: Patient able to self feed;Staff to assist with self feeding;Intermittent supervision to cue for compensatory strategies Compensations: Minimize environmental distractions;Slow rate;Small sips/bites;Lingual sweep for clearance of pocketing;Follow solids with liquid Postural Changes and/or Swallow Maneuvers: Out of bed for meals;Seated upright 90 degrees;Upright 30-60 min after meal                 (Palliative Care for GOC and support; Dietician) Oral care BID;Staff/trained caregiver to provide oral care   Frequent or constant Supervision/Assistance Dysphagia, oropharyngeal phase (R13.12)     All goals met       Tanya Edward, MS, CCC-SLP Speech Language Pathologist Rehab Services; [facility] [phone] (ascom) Tanya Frye  02/11/2024, 4:11 PM

## 2024-02-11 NOTE — Progress Notes (Signed)
   Chaplain On-Call responded to a page from [employer] who reported the patient and her daughter-in-law have questions about Advance Directives.  Chaplain met the patient and daughter-in-law Tanya Frye. The patient was sleepy, so this Chaplain discussed with Tanya the patient's desire to create a Health Care Power of Attorney document.  Chaplain provided education and the HCPOA document to Tanya. Tanya stated that she will assist patient in completing the document, and that she will invite a Notary and Witnesses to come in for completion of HCPOA.  Chaplain told Tanya that the Chaplain will need to be notified when the document is ready to be copied for the hospital medical record.  Chaplain Dean Every., [facility]

## 2024-02-11 NOTE — Plan of Care (Signed)
  Problem: Education: Goal: Knowledge of General Education information will improve Description: Including pain rating scale, medication(s)/side effects and non-pharmacologic comfort measures Outcome: Progressing   Problem: Health Behavior/Discharge Planning: Goal: Ability to manage health-related needs will improve Outcome: Progressing   Problem: Clinical Measurements: Goal: Ability to maintain clinical measurements within normal limits will improve Outcome: Progressing Goal: Will remain free from infection Outcome: Progressing Goal: Diagnostic test results will improve Outcome: Progressing Goal: Respiratory complications will improve Outcome: Progressing Goal: Cardiovascular complication will be avoided Outcome: Progressing   Problem: Activity: Goal: Risk for activity intolerance will decrease Outcome: Progressing   Problem: Nutrition: Goal: Adequate nutrition will be maintained Outcome: Progressing   Problem: Coping: Goal: Level of anxiety will decrease Outcome: Progressing   Problem: Elimination: Goal: Will not experience complications related to bowel motility Outcome: Progressing Goal: Will not experience complications related to urinary retention Outcome: Progressing   Problem: Pain Managment: Goal: General experience of comfort will improve and/or be controlled Outcome: Progressing   Problem: Safety: Goal: Ability to remain free from injury will improve Outcome: Progressing   Problem: Skin Integrity: Goal: Risk for impaired skin integrity will decrease Outcome: Progressing   Problem: Education: Goal: Ability to describe self-care measures that may prevent or decrease complications (Diabetes Survival Skills Education) will improve Outcome: Progressing Goal: Individualized Educational Video(s) Outcome: Progressing   Problem: Coping: Goal: Ability to adjust to condition or change in health will improve Outcome: Progressing   Problem: Fluid  Volume: Goal: Ability to maintain a balanced intake and output will improve Outcome: Progressing   Problem: Health Behavior/Discharge Planning: Goal: Ability to identify and utilize available resources and services will improve Outcome: Progressing Goal: Ability to manage health-related needs will improve Outcome: Progressing   Problem: Metabolic: Goal: Ability to maintain appropriate glucose levels will improve Outcome: Progressing   Problem: Nutritional: Goal: Maintenance of adequate nutrition will improve Outcome: Progressing Goal: Progress toward achieving an optimal weight will improve Outcome: Progressing   Problem: Skin Integrity: Goal: Risk for impaired skin integrity will decrease Outcome: Progressing   Problem: Tissue Perfusion: Goal: Adequacy of tissue perfusion will improve Outcome: Progressing   Problem: Activity: Goal: Ability to tolerate increased activity will improve Outcome: Progressing   Problem: Respiratory: Goal: Ability to maintain a clear airway and adequate ventilation will improve Outcome: Progressing   Problem: Role Relationship: Goal: Method of communication will improve Outcome: Progressing   Problem: Education: Goal: Ability to describe self-care measures that may prevent or decrease complications (Diabetes Survival Skills Education) will improve Outcome: Progressing Goal: Individualized Educational Video(s) Outcome: Progressing   Problem: Cardiac: Goal: Ability to maintain an adequate cardiac output will improve Outcome: Progressing   Problem: Health Behavior/Discharge Planning: Goal: Ability to identify and utilize available resources and services will improve Outcome: Progressing Goal: Ability to manage health-related needs will improve Outcome: Progressing   Problem: Fluid Volume: Goal: Ability to achieve a balanced intake and output will improve Outcome: Progressing   Problem: Metabolic: Goal: Ability to maintain  appropriate glucose levels will improve Outcome: Progressing   Problem: Nutritional: Goal: Maintenance of adequate nutrition will improve Outcome: Progressing Goal: Maintenance of adequate weight for body size and type will improve Outcome: Progressing   Problem: Respiratory: Goal: Will regain and/or maintain adequate ventilation Outcome: Progressing   Problem: Urinary Elimination: Goal: Ability to achieve and maintain adequate renal perfusion and functioning will improve Outcome: Progressing

## 2024-02-11 NOTE — TOC Progression Note (Addendum)
 Transition of Care [facility]) - Progression Note    Patient Details  Name: TAHRA HITZEMAN MRN: [id number] Date of Birth: [birth year]  Transition [facility]) CM/SW Contact  Baird Bombard, RN Phone Number: 02/11/2024, 10:08 AM  Clinical Narrative:    Attempt to reach patient's relative, Jodie to discuss bed offers. No answer. Left a message.   12:44pm Spoke with Jodie. She was given bed offer for [facility]. She stated she is not impressed. RNCM advised patient does not have any other bed offers. Jodie advised of other options for discharge including asking for facility transfer. RNCM advised Jodie of CMS guideline for transferring to first available bed at time of discharge.          Expected Discharge Plan and Services                                               Social Determinants of Health (SDOH) Interventions SDOH Screenings   Food Insecurity: Patient Unable To Answer (02/04/2024)  Housing: Patient Unable To Answer (01/29/2024)  Transportation Needs: Patient Unable To Answer (01/29/2024)  Utilities: Patient Unable To Answer (01/29/2024)  Alcohol Screen: Low Risk  (09/05/2021)  Depression (PHQ2-9): Low Risk  (09/05/2021)  Financial Resource Strain: Low Risk  (09/05/2021)  Physical Activity: Inactive (09/05/2021)  Social Connections: Patient Unable To Answer (01/29/2024)  Stress: No Stress Concern Present (09/05/2021)  Tobacco Use: Low Risk  (01/28/2024)    Readmission Risk Interventions     No data to display

## 2024-02-11 NOTE — Progress Notes (Signed)
 PROGRESS NOTE    Tanya Frye  JYN:[id number] DOB: [birth year] DOA: 01/28/2024 PCP: Pcp, No  241A/241A-AA  LOS: 14 days   Brief hospital course:   Assessment & Plan: Tanya Frye is a 83 year old female with a past medical history significant for COPD, CHF,  hypertension, diabetes mellitus, and hard of hearing who presents to [facility] ED on 01/28/2024 due to shortness of breath.  Patient is currently unable to contribute to history due to acute respiratory distress, BiPAP, and being extremely hard of hearing, therefore history is obtained from chart review.   Per report she acutely developed respiratory distress earlier this morning.  Upon EMS arrival she was noted to be hypoxic with O2 saturations of 90% on room air which they placed her on CPAP en route to the hospital.  Given her respiratory distress upon arrival, she was transitioned to BiPAP.  Following fluid resuscitation in the ED, systolic blood pressure decreasing to the 70s and 80s requiring initiation of peripheral Levophed .   PCCM asked to admit for further workup and treatment.   Shock: cardiogenic & septic. See NP Keene's note on how pt met shock criteria. Weaned off of pressors. D/c milirinone drip as per cardio.  Poor prognosis & high risk for further deterioration. Cardio following    Acute metabolic encephalopathy:  Awake & oriented to self, place.  --supportive care   Acute systolic CHF: decompensated.   --cont torsemide , spironolactone  --cont losartan    HTN:  continue on losartan , aldactone     A. fib: w/ RVR.  New onset.  --cont amiodarone  --cont Eliquis    Acute hypoxic respiratory failure:  s/p intubation, ventilation & extubation.  --currently on room air   Possible CAP:  completed abx course.     B/l pleural effusions:  --cont torsemide , spironolactone    AKI: resolved    Hypokalemia:  --monitor and supplement PRN   Metabolic alkalosis: secondary to diuresis.  D/c'ed diamox  as per cardio   DM2:   well controlled, HbA1c 6.6.  --cont glargine 10u BID --change BG checks to ACHS   Hypothyroidism:  continue on levothyroxine     Obesity: BMI 30.6. Complicates overall care & prognosis   Leukocytosis --WBC trending up --monitor for now    DVT prophylaxis: On:Eliquis  Code Status: DNR  Family Communication:  Level of care: Progressive Dispo:   The patient is from:  Anticipated d/c is to: SNF rehab Anticipated d/c date is: whenever bed available    Subjective and Interval History:  Pt admitted to a little short of breath when asked.   Objective: Vitals:   02/11/24 0512 02/11/24 0757 02/11/24 1044 02/11/24 1628  BP:  118/66 105/64 (!) 121/42  Pulse:  99 (!) 53 93  Resp:  19 18 20   Temp:  98.6 F (37 C) 97.9 F (36.6 C) 98.1 F (36.7 C)  TempSrc:    Axillary  SpO2:  98% 99% 100%  Weight: 76.8 kg     Height:        Intake/Output Summary (Last 24 hours) at 02/11/2024 2010 Last data filed at 02/11/2024 1700 Gross per 24 hour  Intake --  Output 650 ml  Net -650 ml   Filed Weights   02/09/24 0447 02/10/24 0500 02/11/24 0512  Weight: 79.2 kg 77.1 kg 76.8 kg    Examination:   Constitutional: NAD, alert HEENT: conjunctivae and lids normal, EOMI CV: No cyanosis.   RESP: normal respiratory effort, on RA Neuro: II - XII grossly intact.     Data Reviewed: I have  personally reviewed labs and imaging studies  Time spent: 50 minutes  Garrison Kanner, MD [facility] If 7PM-7AM, please contact night-coverage 02/11/2024, 8:10 PM

## 2024-02-12 DIAGNOSIS — R570 Cardiogenic shock: Secondary | ICD-10-CM | POA: Diagnosis not present

## 2024-02-12 LAB — RENAL FUNCTION PANEL
Albumin: 3.5 g/dL (ref 3.5–5.0)
Anion gap: 10 (ref 5–15)
BUN: 45 mg/dL — ABNORMAL HIGH (ref 8–23)
CO2: 26 mmol/L (ref 22–32)
Calcium: 9.1 mg/dL (ref 8.9–10.3)
Chloride: 98 mmol/L (ref 98–111)
Creatinine, Ser: 0.96 mg/dL (ref 0.44–1.00)
GFR, Estimated: 59 mL/min — ABNORMAL LOW
Glucose, Bld: 170 mg/dL — ABNORMAL HIGH (ref 70–99)
Phosphorus: 3.5 mg/dL (ref 2.5–4.6)
Potassium: 3.9 mmol/L (ref 3.5–5.1)
Sodium: 134 mmol/L — ABNORMAL LOW (ref 135–145)

## 2024-02-12 LAB — GLUCOSE, CAPILLARY
Glucose-Capillary: 202 mg/dL — ABNORMAL HIGH (ref 70–99)
Glucose-Capillary: 180 mg/dL — ABNORMAL HIGH (ref 70–99)
Glucose-Capillary: 230 mg/dL — ABNORMAL HIGH (ref 70–99)
Glucose-Capillary: 199 mg/dL — ABNORMAL HIGH (ref 70–99)
Glucose-Capillary: 243 mg/dL — ABNORMAL HIGH (ref 70–99)

## 2024-02-12 MED ORDER — QUETIAPINE FUMARATE 25 MG PO TABS: 25.0000 mg | ORAL_TABLET | Freq: Every day | ORAL | Status: DC

## 2024-02-12 NOTE — TOC Progression Note (Addendum)
 Transition of Care [facility]) - Progression Note    Patient Details  Name: Tanya Frye MRN: [id number] Date of Birth: Feb 19, 1941  Transition of Care [facility]) CM/SW Contact  Baird Bombard, RN Phone Number: 02/12/2024, 12:05 PM  Clinical Narrative:    Eldora Greet with Jodie, patient's relative regarding SNF search. Jodie is requesting bed search be extended to area SNF not already previously sent a referral including, [facility], [address], and [facility]. She stated she would like to keep the patient closer to where she lives , near [facility]. If facility of choice or preference is not secured family may take patient home with [facility].   Bed search extended to requested facilities.   1:16pm Spoke with Bernardo Bridgeman from [facility]. No females beds are available until Monday.  Spoke with Jodie to give bed offers for [facility] and [facility]. She will talk with her family and make a decision. She was advised per MD patient is medically stable of discharge.   3:17pm Attempt to contact Jodie, patient's relative. Left a message for Jodie regarding decision on discharge plan. Spoke with Fabian Holster. Patient bed is not confirmed for today. "We had something fall through."              Expected Discharge Plan and Services                                               Social Determinants of Health (SDOH) Interventions SDOH Screenings   Food Insecurity: Patient Unable To Answer (02/04/2024)  Housing: Patient Unable To Answer (01/29/2024)  Transportation Needs: Patient Unable To Answer (01/29/2024)  Utilities: Patient Unable To Answer (01/29/2024)  Alcohol Screen: Low Risk  (09/05/2021)  Depression (PHQ2-9): Low Risk  (09/05/2021)  Financial Resource Strain: Low Risk  (09/05/2021)  Physical Activity: Inactive (09/05/2021)  Social Connections: Patient Unable To Answer (01/29/2024)  Stress: No Stress Concern Present (09/05/2021)  Tobacco Use: Low Risk  (01/28/2024)     Readmission Risk Interventions     No data to display

## 2024-02-12 NOTE — Progress Notes (Signed)
 Physical Therapy Treatment Patient Details Name: Tanya Frye MRN: [id number] DOB: 1941-01-08 Today's Date: 02/12/2024   History of Present Illness 83 y/o female presented to ED on 01/28/24 for SOB. Intubated 5/7-5/16 (1 way extubation). Admitted with septic shock with progressive multi organ failure. PMH: COPD, CHF, HTN, T2DM    PT Comments  Pt fatigued, hypoactive on arrival, asking to be let down so she can rest, but she is agreeable to PT session. Pt at 45 degrees HOB on arrival. Pt assisted with AA/ROM, A/ROM, AR/ROM in session, variable activity based on participation level, sometimes limited by delayed inititation, fatigue, and/ore HOH. Pt repositioned at EOS, SCD engaged, heels floating. Pt remains incredibly weak which limits the ability to advance her activities. Will continue to follow.     If plan is discharge home, recommend the following: Two people to help with walking and/or transfers;Two people to help with bathing/dressing/bathroom;Help with stairs or ramp for entrance;Assist for transportation;Direct supervision/assist for financial management;Direct supervision/assist for medications management;Assistance with cooking/housework;Assistance with feeding   Can travel by private vehicle     No  Equipment Recommendations       Recommendations for Other Services       Precautions / Restrictions Precautions Precautions: Fall Recall of Precautions/Restrictions: Impaired Restrictions Weight Bearing Restrictions Per Provider Order: No     Mobility  Bed Mobility   Bed Mobility: Rolling, Supine to Sit     Supine to sit: Total assist, +2 for physical assistance Sit to supine: Total assist, +2 for physical assistance        Transfers Overall transfer level:  (not ready to advance to transfers training)                      Ambulation/Gait                   Stairs             Wheelchair Mobility     Tilt Bed    Modified Rankin (Stroke  Patients Only)       Balance                                            Communication    Cognition   Behavior During Therapy: Flat affect   PT - Cognitive impairments: Difficult to assess Difficult to assess due to: Hard of hearing/deaf                              Cueing    Exercises Other Exercises Other Exercises: modified sit up x4, BUE use on bed rails, modA of trunk Other Exercises: heel slides, heip extension x15 bilat Other Exercises: LUE shoulder flexion 1x10; RUE shoulder flexion AA/ROM 1x15    General Comments        Pertinent Vitals/Pain Pain Assessment Pain Assessment: No/denies pain    Home Living                          Prior Function            PT Goals (current goals can now be found in the care plan section) Acute Rehab PT Goals PT Goal Formulation: Patient unable to participate in goal setting    Frequency    Min 1X/week  PT Plan      Co-evaluation              AM-PAC PT "6 Clicks" Mobility   Outcome Measure  Help needed turning from your back to your side while in a flat bed without using bedrails?: Total Help needed moving from lying on your back to sitting on the side of a flat bed without using bedrails?: Total Help needed moving to and from a bed to a chair (including a wheelchair)?: Total Help needed standing up from a chair using your arms (e.g., wheelchair or bedside chair)?: Total Help needed to walk in hospital room?: Total Help needed climbing 3-5 steps with a railing? : Total 6 Click Score: 6    End of Session   Activity Tolerance: Patient tolerated treatment well;No increased pain;Patient limited by fatigue Patient left: in bed;with call bell/phone within reach;with bed alarm set Nurse Communication: Mobility status PT Visit Diagnosis: Unsteadiness on feet (R26.81);Muscle weakness (generalized) (M62.81);Other abnormalities of gait and mobility (R26.89)      Time: 6295-2841 PT Time Calculation (min) (ACUTE ONLY): 19 min  Charges:    $Therapeutic Activity: 8-22 mins PT General Charges $$ ACUTE PT VISIT: 1 Visit                    3:05 PM, 02/12/24 Dawn Eth, PT, DPT Physical Therapist - [facility]  [phone] (ASCOM)    Nelani Schmelzle C 02/12/2024, 3:02 PM

## 2024-02-12 NOTE — Plan of Care (Signed)
  Problem: Education: Goal: Knowledge of General Education information will improve Description: Including pain rating scale, medication(s)/side effects and non-pharmacologic comfort measures Outcome: Progressing   Problem: Clinical Measurements: Goal: Ability to maintain clinical measurements within normal limits will improve Outcome: Progressing   Problem: Clinical Measurements: Goal: Will remain free from infection Outcome: Progressing   Problem: Clinical Measurements: Goal: Diagnostic test results will improve Outcome: Progressing   Problem: Clinical Measurements: Goal: Respiratory complications will improve Outcome: Progressing   Problem: Clinical Measurements: Goal: Cardiovascular complication will be avoided Outcome: Progressing   Problem: Activity: Goal: Risk for activity intolerance will decrease Outcome: Progressing   Problem: Elimination: Goal: Will not experience complications related to bowel motility Outcome: Progressing   Problem: Coping: Goal: Level of anxiety will decrease Outcome: Progressing   Problem: Pain Managment: Goal: General experience of comfort will improve and/or be controlled Outcome: Progressing   Problem: Safety: Goal: Ability to remain free from injury will improve Outcome: Progressing   Problem: Skin Integrity: Goal: Risk for impaired skin integrity will decrease Outcome: Progressing   Problem: Cardiac: Goal: Ability to maintain an adequate cardiac output will improve Outcome: Progressing   Problem: Nutritional: Goal: Maintenance of adequate nutrition will improve Outcome: Progressing   Problem: Respiratory: Goal: Will regain and/or maintain adequate ventilation Outcome: Progressing   Plan of care ongoing, see MAR see flowsheet

## 2024-02-12 NOTE — Plan of Care (Signed)
                                                     Palliative Care Progress Note   Patient Name: Tanya Frye       Date: 02/12/2024 DOB: 20-Nov-1940  Age: 83 y.o. MRN#: [id number] Attending Physician: Garrison Kanner, MD Primary Care Physician: Pcp, No Admit Date: 01/28/2024  Extensive chart review completed including labs, vital signs, imaging, progress notes, orders, and available advanced directive documents from current and previous encounters.   Goals are clear. DNR with full interventions remains. Symptom burden is low. TOC following closely for discharge planning to SNF for rehab.   Patient's NOK decision maker is her DIL Tanya Frye, who has MOST form and was advised to complete it with a MD/NP/DO/PA when ready. Additionally, she is working to complete AD.   PMT remains available to patient/familyt throughout her hospitalization.   PMT will step back from daily visits. Please re-engage with PMT if goals change, at patient/family's request, or if patient's health deteriorates during hospitalization.   Thank you for allowing the Palliative Medicine Team to assist in the care of Tanya Frye.  Tanya Nose L. Rebbeca Campi, DNP, FNP-BC Palliative Medicine Team    No charge

## 2024-02-12 NOTE — TOC Progression Note (Signed)
 Transition of Care [facility]) - Progression Note    Patient Details  Name: Tanya Frye MRN: [id number] Date of Birth: [birth year]  Transition of Care [facility]) CM/SW Contact  Baird Bombard, RN Phone Number: 02/12/2024, 3:24 PM  Clinical Narrative:    Attempt to reach Jodie at bedside. No family members at bedside.    3:15 pm Attempt to reach Jodie. No answer. Left a message.          Expected Discharge Plan and Services                                               Social Determinants of Health (SDOH) Interventions SDOH Screenings   Food Insecurity: Patient Unable To Answer (02/04/2024)  Housing: Patient Unable To Answer (01/29/2024)  Transportation Needs: Patient Unable To Answer (01/29/2024)  Utilities: Patient Unable To Answer (01/29/2024)  Alcohol Screen: Low Risk  (09/05/2021)  Depression (PHQ2-9): Low Risk  (09/05/2021)  Financial Resource Strain: Low Risk  (09/05/2021)  Physical Activity: Inactive (09/05/2021)  Social Connections: Patient Unable To Answer (01/29/2024)  Stress: No Stress Concern Present (09/05/2021)  Tobacco Use: Low Risk  (01/28/2024)    Readmission Risk Interventions     No data to display

## 2024-02-12 NOTE — Progress Notes (Signed)
 PROGRESS NOTE    Tanya Frye  WUJ:[id number] DOB: 06/19/1941 DOA: 01/28/2024 PCP: Pcp, No  241A/241A-AA  LOS: 15 days   Brief hospital course:   Assessment & Plan: Tanya Frye is a 83 year old female with a past medical history significant for COPD, CHF,  hypertension, diabetes mellitus, and hard of hearing who presents to [facility] ED on 01/28/2024 due to shortness of breath.  Patient is currently unable to contribute to history due to acute respiratory distress, BiPAP, and being extremely hard of hearing, therefore history is obtained from chart review.   Per report she acutely developed respiratory distress earlier this morning.  Upon EMS arrival she was noted to be hypoxic with O2 saturations of 90% on room air which they placed her on CPAP en route to the hospital.  Given her respiratory distress upon arrival, she was transitioned to BiPAP.  Following fluid resuscitation in the ED, systolic blood pressure decreasing to the 70s and 80s requiring initiation of peripheral Levophed .   PCCM asked to admit for further workup and treatment.   Shock: cardiogenic & septic. See NP Keene's note on how pt met shock criteria. Weaned off of pressors. D/c milirinone drip as per cardio.  Poor prognosis & high risk for further deterioration. Cardio following    Acute metabolic encephalopathy:  Awake & oriented to self, place.  --supportive care   Acute systolic CHF: decompensated.   --cont torsemide , spironolactone  --cont losartan    HTN:  continue on losartan , aldactone     A. fib: w/ RVR.  New onset.  --cont amiodarone  --cont eliquis    Acute hypoxic respiratory failure:  s/p intubation, ventilation & extubation.  --currently on room air   Possible CAP:  completed abx course.     B/l pleural effusions:  --cont torsemide , spironolactone    AKI: resolved    Hypokalemia:  --monitor and supplement PRN   Metabolic alkalosis: secondary to diuresis.  D/c'ed diamox  as per cardio   DM2:   well controlled, HbA1c 6.6.  --cont glargine 10u BID --ACHs and SSI   Hypothyroidism:  --cont Synthroid    Obesity: BMI 30.6. Complicates overall care & prognosis   Leukocytosis --WBC trending up --monitor for now    DVT prophylaxis: On:Eliquis  Code Status: DNR  Family Communication: daughter-in-law Tanya Frye Manual updated at bedside today Level of care: Progressive Dispo:   The patient is from:  Anticipated d/c is to: SNF rehab Anticipated d/c date is: whenever bed available    Subjective and Interval History:  Pt reported feeling tired and gassy.   Objective: Vitals:   02/12/24 0729 02/12/24 1117 02/12/24 1437 02/12/24 1933  BP: 99/64 107/61 118/61 (!) 112/45  Pulse: 93 (!) 103 (!) 54 88  Resp: 16 19 18 17   Temp: 98.6 F (37 C) 98.6 F (37 C) 98.7 F (37.1 C) 97.6 F (36.4 C)  TempSrc:    Oral  SpO2: 100% 100% 100% 99%  Weight:      Height:        Intake/Output Summary (Last 24 hours) at 02/12/2024 2032 Last data filed at 02/12/2024 1940 Gross per 24 hour  Intake 610 ml  Output 450 ml  Net 160 ml   Filed Weights   02/10/24 0500 02/11/24 0512 02/12/24 0500  Weight: 77.1 kg 76.8 kg 81.9 kg    Examination:   Constitutional: NAD, AAOx3 HEENT: conjunctivae and lids normal, EOMI CV: No cyanosis.   RESP: normal respiratory effort, on RA Neuro: II - XII grossly intact.     Data Reviewed: I  have personally reviewed labs and imaging studies  Time spent: 35 minutes  Garrison Kanner, MD [facility] If 7PM-7AM, please contact night-coverage 02/12/2024, 8:32 PM

## 2024-02-13 LAB — GLUCOSE, CAPILLARY
Glucose-Capillary: 178 mg/dL — ABNORMAL HIGH (ref 70–99)
Glucose-Capillary: 225 mg/dL — ABNORMAL HIGH (ref 70–99)

## 2024-02-13 MED ORDER — ENSURE ENLIVE PO LIQD: 237.0000 mL | Freq: Two times a day (BID) | ORAL | Status: AC

## 2024-02-13 MED ORDER — SPIRONOLACTONE 25 MG PO TABS: 25.0000 mg | ORAL_TABLET | Freq: Every day | ORAL | Status: DC

## 2024-02-13 MED ORDER — AMIODARONE HCL 200 MG PO TABS: 200.0000 mg | ORAL_TABLET | Freq: Two times a day (BID) | ORAL | Status: DC

## 2024-02-13 MED ORDER — INSULIN GLARGINE-YFGN 100 UNIT/ML ~~LOC~~ SOLN: 10.0000 [IU] | Freq: Two times a day (BID) | SUBCUTANEOUS | Status: DC

## 2024-02-13 MED ORDER — LOSARTAN POTASSIUM 25 MG PO TABS: 12.5000 mg | ORAL_TABLET | Freq: Every day | ORAL | Status: DC

## 2024-02-13 MED ORDER — TORSEMIDE 40 MG PO TABS: 40.0000 mg | ORAL_TABLET | Freq: Every day | ORAL | Status: DC

## 2024-02-13 MED ORDER — ADULT MULTIVITAMIN W/MINERALS CH: 1.0000 | ORAL_TABLET | Freq: Every day | ORAL | Status: AC

## 2024-02-13 MED ORDER — APIXABAN 5 MG PO TABS: 5.0000 mg | ORAL_TABLET | Freq: Two times a day (BID) | ORAL | Status: AC

## 2024-02-13 NOTE — Plan of Care (Signed)
  Problem: Education: Goal: Knowledge of General Education information will improve Description: Including pain rating scale, medication(s)/side effects and non-pharmacologic comfort measures Outcome: Progressing   Problem: Clinical Measurements: Goal: Ability to maintain clinical measurements within normal limits will improve Outcome: Progressing   Problem: Clinical Measurements: Goal: Will remain free from infection Outcome: Progressing   Problem: Clinical Measurements: Goal: Diagnostic test results will improve Outcome: Progressing   Problem: Clinical Measurements: Goal: Respiratory complications will improve Outcome: Progressing   Problem: Clinical Measurements: Goal: Cardiovascular complication will be avoided Outcome: Progressing   Problem: Coping: Goal: Level of anxiety will decrease Outcome: Progressing   Problem: Nutrition: Goal: Adequate nutrition will be maintained Outcome: Progressing   Problem: Safety: Goal: Ability to remain free from injury will improve Outcome: Progressing   Problem: Skin Integrity: Goal: Risk for impaired skin integrity will decrease Outcome: Progressing   Problem: Role Relationship: Goal: Method of communication will improve Outcome: Progressing   Problem: Cardiac: Goal: Ability to maintain an adequate cardiac output will improve Outcome: Progressing   Plan of care ongoing, see MAR see flow sheet

## 2024-02-13 NOTE — Progress Notes (Signed)
 Occupational Therapy Treatment Patient Details Name: TOBEY LIPPARD MRN: [id number] DOB: 1941-05-05 Today's Date: 02/13/2024   History of present illness 83 y/o female presented to ED on 01/28/24 for SOB. Intubated 5/7-5/16 (1 way extubation). Admitted with septic shock with progressive multi organ failure. PMH: COPD, CHF, HTN, T2DM   OT comments  Chart reviewed to date, pt greeted semi supine in bed, agreeable to OT tx session targeting improving functional activity tolerance in preparation for ADL tasks. Improvements noted with mobility with pt requiring MAX A +1-2 for supine<>sit, STS in [place] with MOD-MAX A +2. MAX-TOTAL A required for peri care after incontinent BM. Pt is left as received, all needs met. VSS throughout. Pt will continue to benefit from acute OT to address functional deficits and to facilitate optimal ADL performance.       If plan is discharge home, recommend the following:  Two people to help with walking and/or transfers;Two people to help with bathing/dressing/bathroom;Assistance with cooking/housework;Assistance with feeding;Help with stairs or ramp for entrance;Assist for transportation;Direct supervision/assist for financial management;Direct supervision/assist for medications management;Supervision due to cognitive status   Equipment Recommendations  Other (comment) (defer to next venue of care)    Recommendations for Other Services      Precautions / Restrictions Precautions Precautions: Fall Recall of Precautions/Restrictions: Impaired Restrictions Weight Bearing Restrictions Per Provider Order: No       Mobility Bed Mobility Overal bed mobility: Needs Assistance Bed Mobility: Rolling, Sidelying to Sit Rolling: Mod assist, +2 for physical assistance, Used rails Sidelying to sit: Max assist, +2 for physical assistance, HOB elevated, Used rails            Transfers Overall transfer level: Needs assistance Equipment used: Ambulation equipment  used Transfers: Sit to/from Stand Sit to Stand: Max assist, +2 physical assistance, From elevated surface, Via lift equipment                 Balance Overall balance assessment: Needs assistance Sitting-balance support: Feet supported Sitting balance-Leahy Scale: Poor   Postural control: Left lateral lean Standing balance support: Bilateral upper extremity supported, During functional activity, Reliant on assistive device for balance Standing balance-Leahy Scale: Poor                             ADL either performed or assessed with clinical judgement   ADL Overall ADL's : Needs assistance/impaired             Lower Body Bathing: Total assistance;Maximal assistance;Sit to/from stand Lower Body Bathing Details (indicate cue type and reason): in [place]     Lower Body Dressing: Maximal assistance;Bed level   Toilet Transfer: +2 for safety/equipment;+2 for physical assistance;Maximal assistance;Moderate assistance Toilet Transfer Details (indicate cue type and reason): simulated, in [employer]- Clothing Manipulation and Hygiene: Maximal assistance;Total assistance;Sit to/from stand Toileting - Clothing Manipulation Details (indicate cue type and reason): incontinent BM STS in [facility]            Extremity/Trunk Assessment              Vision       Perception     Praxis     Communication Communication Communication: Impaired Factors Affecting Communication: Hearing impaired   Cognition Arousal: Alert Behavior During Therapy: WFL for tasks assessed/performed Cognition: Cognition impaired   Orientation impairments: Time, Situation Awareness: Intellectual awareness impaired, Online awareness impaired Memory impairment (select all impairments): Short-term memory Attention impairment (select first level of impairment): Sustained  attention Executive functioning impairment (select all impairments): Organization, Sequencing, Reasoning, Problem  solving                   Following commands: Impaired Following commands impaired: Follows one step commands inconsistently, Follows one step commands with increased time      Cueing   Cueing Techniques: Verbal cues, Gestural cues, Tactile cues  Exercises Other Exercises Other Exercises: edu pt and DIL re: role of OT, role of rehab, discharge recommendations    Shoulder Instructions       General Comments      Pertinent Vitals/ Pain       Pain Assessment Pain Assessment: Faces Faces Pain Scale: Hurts little more Pain Location: bottom, pure wick Pain Descriptors / Indicators: Discomfort, Sore Pain Intervention(s): Monitored during session, Repositioned  Home Living                                          Prior Functioning/Environment              Frequency  Min 2X/week        Progress Toward Goals  OT Goals(current goals can now be found in the care plan section)  Progress towards OT goals: Progressing toward goals  Acute Rehab OT Goals Time For Goal Achievement: 02/21/24  Plan      Co-evaluation    PT/OT/SLP Co-Evaluation/Treatment: Yes Reason for Co-Treatment: To address functional/ADL transfers;Necessary to address cognition/behavior during functional activity;For patient/therapist safety PT goals addressed during session: Mobility/safety with mobility;Balance;Proper use of DME OT goals addressed during session: ADL's and self-care      AM-PAC OT "6 Clicks" Daily Activity     Outcome Measure   Help from another person eating meals?: A Lot Help from another person taking care of personal grooming?: A Lot Help from another person toileting, which includes using toliet, bedpan, or urinal?: A Lot Help from another person bathing (including washing, rinsing, drying)?: A Lot Help from another person to put on and taking off regular upper body clothing?: A Lot Help from another person to put on and taking off regular lower  body clothing?: A Lot 6 Click Score: 12    End of Session Equipment Utilized During Treatment: Other (comment) (stedy)  OT Visit Diagnosis: Other abnormalities of gait and mobility (R26.89);Muscle weakness (generalized) (M62.81)   Activity Tolerance Patient tolerated treatment well   Patient Left in bed;with call bell/phone within reach;with bed alarm set;with family/visitor present   Nurse Communication Mobility status        Time: 2952-8413 OT Time Calculation (min): 23 min  Charges: OT General Charges $OT Visit: 1 Visit OT Treatments $Therapeutic Activity: 8-22 mins  Gerre Kraft, OTD OTR/L  02/13/24, 12:37 PM

## 2024-02-13 NOTE — TOC Transition Note (Signed)
 Transition of Care [facility]) - Discharge Note   Patient Details  Name: Tanya Frye MRN: [id number] Date of Birth: Jun 09, 1941  Transition of Care [facility]) CM/SW Contact:  Nickholas Goldston C Jenevieve Kirschbaum, RN Phone Number: 02/13/2024, 12:11 PM   Clinical Narrative:     Eldora Greet with Ira Mann at  [facility]  Per facility patient admission confirmed for today. Patient assigned room # 605-A Nurse will call report to [phone] Face sheet and medical necessity forms printed to the floor to be added to the EMS pack EMS arranged for 3pm Discharge summary and SNF transfer report sent in HUB.  Nurse, and family notified spoke with Jodie, POA TOC signing off.          Patient Goals and CMS Choice            Discharge Placement                       Discharge Plan and Services Additional resources added to the After Visit Summary for                                       Social Drivers of Health (SDOH) Interventions SDOH Screenings   Food Insecurity: Patient Unable To Answer (02/04/2024)  Housing: Patient Unable To Answer (01/29/2024)  Transportation Needs: Patient Unable To Answer (01/29/2024)  Utilities: Patient Unable To Answer (01/29/2024)  Alcohol Screen: Low Risk  (09/05/2021)  Depression (PHQ2-9): Low Risk  (09/05/2021)  Financial Resource Strain: Low Risk  (09/05/2021)  Physical Activity: Inactive (09/05/2021)  Social Connections: Patient Unable To Answer (01/29/2024)  Stress: No Stress Concern Present (09/05/2021)  Tobacco Use: Low Risk  (01/28/2024)     Readmission Risk Interventions     No data to display

## 2024-02-13 NOTE — Discharge Summary (Signed)
 Physician Discharge Summary   Tanya Frye  female DOB: 1941-06-26  NWG:[id number]  PCP: Pcp, No  Admit date: 01/28/2024 Discharge date: 02/13/2024  Admitted From: home Disposition:  SNF rehab CODE STATUS: DNR  Discharge Instructions     Discharge diet:   Complete by: As directed    DYS 1, Fluid consistency: Thin - -   Discharge wound care:   Complete by: As directed    Every shift:  Cleanse buttocks and perineum with soap and water  and pat dry. Apply barrier cream twice daily and PRN soilage.  [facility] Course:  For full details, please see H&P, progress notes, consult notes and ancillary notes.  Briefly,  Tanya Frye is a 83 year old female with a past medical history significant for COPD, CHF,  hypertension, diabetes mellitus, and hard of hearing who presented to [facility] ED on 01/28/2024 due to shortness of breath.    Following fluid resuscitation in the ED, systolic blood pressure decreasing to the 70s and 80s requiring initiation of peripheral Levophed .  PCCM asked to admit for further workup and treatment.   Shock: cardiogenic & septic.  Weaned off of pressors and milirinone drip.   Acute metabolic encephalopathy:  --mental status improved, now Awake & oriented to self, place.    Acute systolic CHF - New, though potentially had a history of HF per family - Echo 05/25: EF 25-30%, RV dysfunction --Heart Failure consulted.  started on spironolactone  25 mg and torsemide  40 mg daily.  Home lasix  d/c'ed.  Losartan  reduced from 50 mg daily to 12.5 mg daily.  Home Toprol  not continued.  A. fib: w/ RVR.  - Newly diagnosed. Suspect a consequence of ongoing shock and new HF. - Rate controlled in the 90s on amiodarone  200mg  BID, continue. Pt to come back for outpatient cardioversion after 3 weeks of anticoagulation. Will avoid TEE.  --started on eliquis    Acute hypoxic respiratory failure:  2/2 CHF and possible PNA s/p intubation, ventilation & extubation.   --currently on room air   Possible CAP:  completed 5-day course with ceftriaxone  and azithro.   B/l pleural effusions:  --started on torsemide , spironolactone    AKI: resolved  --Cr peaked at 1.59 on the day of presentation.  Back to baseline 0.9 prior to discharge.   Hypokalemia:  --monitored and supplemented PRN   Metabolic alkalosis:  secondary to diuresis.  --received 7 days of diamox     DM2:  well controlled, HbA1c 6.6.  Pt only has metformin  on home med list.  However, pt has been needing glargine 10u BID and SSI during hospitalization, therefore, pt was discharged on glargine 10u BID. --resume home metformin  after discharge.   Hypothyroidism:  --cont home Synthroid    Obesity: BMI 30.6.  Complicates overall care & prognosis    Leukocytosis --WBC remained high throughout hospitalization, however, already received a course of broad-spectrum abx and no sign or source of infection, so did not resume further abx.  Pressure Injury 02/02/24 Sacrum Lower;Mid Stage 2 - Partial thickness loss of dermis presenting as a shallow open injury with a red, pink wound bed without slough.   Pressure Injury 02/02/24 Buttocks Right;Upper Stage 2 - Partial thickness loss of dermis presenting as a shallow open injury with a red, pink wound bed without slough.    Discharge Diagnoses:  Principal Problem:   Cardiogenic shock (HCC) Active Problems:   Acute respiratory failure (HCC)   Sepsis (HCC)   Dilated cardiomyopathy (HCC)   Non-STEMI (  non-ST elevated myocardial infarction) [facility])   Community acquired pneumonia   Atrial fibrillation with RVR (HCC)   Pressure injury of skin   30 Day Unplanned Readmission Risk Score    Flowsheet Row ED to Hosp-Admission (Current) from 01/28/2024 in [facility]U  30 Day Unplanned Readmission Risk Score (%) 18.92 Filed at 02/13/2024 1200       This score is the patient's risk of an unplanned readmission within 30 days of being  discharged (0 -100%). The score is based on dignosis, age, lab data, medications, orders, and past utilization.   Low:  0-14.9   Medium: 15-21.9   High: 22-29.9   Extreme: 30 and above         Discharge Instructions:  Allergies as of 02/13/2024       Reactions   Penicillins Rash        Medication List     STOP taking these medications    furosemide  20 MG tablet Commonly known as: LASIX    metoprolol  succinate 50 MG 24 hr tablet Commonly known as: TOPROL -XL       TAKE these medications    amiodarone  200 MG tablet Commonly known as: PACERONE  Take 1 tablet (200 mg total) by mouth 2 (two) times daily.   apixaban  5 MG Tabs tablet Commonly known as: ELIQUIS  Take 1 tablet (5 mg total) by mouth 2 (two) times daily.   aspirin  EC 81 MG tablet Take 1 tablet by mouth daily.   CALCIUM 1200 PO Take 1 tablet by mouth daily.   feeding supplement Liqd Take 237 mLs by mouth 2 (two) times daily between meals.   Fish Oil 500 MG Caps Take 1,000 mg by mouth daily.   insulin  glargine-yfgn 100 UNIT/ML injection Commonly known as: SEMGLEE  Inject 0.1 mLs (10 Units total) into the skin 2 (two) times daily.   levothyroxine  75 MCG tablet Commonly known as: SYNTHROID  Take 75 mcg by mouth daily before breakfast.   loratadine  10 MG tablet Commonly known as: CLARITIN  TAKE 1 TABLET BY MOUTH EVERY DAY   losartan  25 MG tablet Commonly known as: COZAAR  Take 0.5 tablets (12.5 mg total) by mouth daily. Reduced from 50 mg. What changed:  medication strength how much to take additional instructions   lovastatin 10 MG tablet Commonly known as: MEVACOR TAKE 1 TABLET ONCE A DAY ORALLY   metFORMIN  1000 MG tablet Commonly known as: GLUCOPHAGE  TAKE 1 TABLET BY MOUTH TWICE A DAY   multivitamin with minerals Tabs tablet Take 1 tablet by mouth daily. Start taking on: Feb 14, 2024   spironolactone  25 MG tablet Commonly known as: ALDACTONE  Take 1 tablet (25 mg total) by mouth  daily. Start taking on: Feb 14, 2024   Torsemide  40 MG Tabs Take 40 mg by mouth daily. Start taking on: Feb 14, 2024               Discharge Care Instructions  (From admission, onward)           Start     Ordered   02/13/24 0000  Discharge wound care:       Comments: Every shift:  Cleanse buttocks and perineum with soap and water  and pat dry. Apply barrier cream twice daily and PRN soilage.  - -   02/13/24 1300              Allergies  Allergen Reactions   Penicillins Rash     The results of significant diagnostics from this hospitalization (including  imaging, microbiology, ancillary and laboratory) are listed below for reference.   Consultations:   Procedures/Studies: DG Chest Port 1 View Result Date: 02/06/2024 CLINICAL DATA:  Hypoxia. EXAM: PORTABLE CHEST 1 VIEW COMPARISON:  02/04/2024 FINDINGS: The cardio pericardial silhouette is enlarged. There is pulmonary vascular congestion without overt pulmonary edema. Bibasilar atelectasis or infiltrate is similar to prior with probable bilateral pleural effusions. Right subclavian central line tip overlies the distal SVC level. The NG tube passes into the stomach although the distal tip position is not included on the film. Endotracheal tube tip is 2.4 cm above the base of the carina. Telemetry leads overlie the chest. IMPRESSION: 1. No significant interval change. 2. Persistent bibasilar collapse/consolidation with probable bilateral pleural effusions. Electronically Signed   By: Donnal Fusi M.D.   On: 02/06/2024 07:16   DG Chest Port 1 View Result Date: 02/04/2024 CLINICAL DATA:  83 year old female with respiratory failure and hypoxia. EXAM: PORTABLE CHEST 1 VIEW COMPARISON:  CTA chest, CT Abdomen and Pelvis 01/28/2024. Portable chest yesterday and earlier. FINDINGS: Portable AP semi upright view at 0419 hours. Intubated, endotracheal tube tip in stable position between the clavicles and carina. Enteric tube  courses to the abdomen. Right subclavian central line appears stable. Lung volumes have not significantly changed. Stable cardiac size and mediastinal contours. Dense retrocardiac and bibasilar veiling opacity persists, but upper lung ventilation does appear mildly improved. Bilateral pleural effusions and compressive atelectasis demonstrated by CTA earlier this month. No pneumothorax. No air bronchograms. Paucity of bowel gas. Stable visualized osseous structures. IMPRESSION: 1.  Stable lines and tubes. 2. Mildly improved bilateral ventilation since yesterday with unresolved bilateral pleural effusions and associated lower lobe collapse or consolidation. Electronically Signed   By: Marlise Simpers M.D.   On: 02/04/2024 05:04   DG Abd 1 View Result Date: 02/04/2024 CLINICAL DATA:  83 year old female with abdominal distension. EXAM: ABDOMEN - 1 VIEW COMPARISON:  CT Abdomen and Pelvis 01/28/2024. Abdominal radiographs 01/29/2024. FINDINGS: Portable AP supine views at 0421 hours. Enteric tube terminates in the left upper quadrant, side hole the level of the gastric body. Paucity of bowel gas, nonobstructed gas pattern, no dilated loops are evident. Stable cholecystectomy clips. Ongoing lung base veiling opacity. Stable visualized osseous structures. IMPRESSION: 1. Enteric tube terminates in the stomach. 2. Paucity of bowel gas with a nonobstructed gas pattern. Electronically Signed   By: Marlise Simpers M.D.   On: 02/04/2024 05:00   DG Chest Port 1 View Result Date: 02/03/2024 CLINICAL DATA:  Acute respiratory failure with hypoxia EXAM: PORTABLE CHEST 1 VIEW COMPARISON:  Feb 01, 2024 FINDINGS: Comparison with prior examination there is no change in the position of the endotracheal tube the tip of which remains 2 cm from carina. The nasogastric tube remains in anatomic position the tip in the stomach. No change right subclavian CVP line tip in the right atrium SVC junction. Persistent worsening bilateral pulmonary infiltrates  with persistent bilateral pleural effusions and basilar compressive atelectasis. No pneumothorax. IMPRESSION: Worsening bilateral pulmonary infiltrates and pleural effusions. Electronically Signed   By: Fredrich Jefferson M.D.   On: 02/03/2024 07:34   DG Chest Port 1 View Result Date: 02/01/2024 CLINICAL DATA:  142230 Pleural effusion 142230 EXAM: PORTABLE CHEST 1 VIEW COMPARISON:  Jan 30, 2024 FINDINGS: The cardiomediastinal silhouette is unchanged in contour.ETT tip terminates 12 mm above the carina. The enteric tube courses through the chest to the abdomen beyond the field-of-view. RIGHT subclavian CVC tip terminates over the RIGHT atrium. Bilateral pleural effusions.  No pneumothorax. LEFT retrocardiac homogeneous opacity. Hazy bibasilar opacities. Perihilar vascular fullness with mild interstitial markings and peribronchial cuffing. IMPRESSION: 1.  Support apparatus as described above. 2. Bilateral pleural effusions with LEFT retrocardiac opacity, favored to reflect atelectasis. 3. Background of pulmonary edema. Electronically Signed   By: Clancy Crimes M.D.   On: 02/01/2024 11:20   DG Chest Port 1 View Result Date: 01/30/2024 CLINICAL DATA:  Central line placement. EXAM: PORTABLE CHEST 1 VIEW COMPARISON:  Earlier today. FINDINGS: Right subclavian central line tip is in the right atrium. No pneumothorax. Endotracheal and enteric tubes are unchanged in position. Increasing hazy opacity at the lung bases likely pleural effusions and atelectasis. Vascular congestion. Stable heart size and mediastinal contours. IMPRESSION: 1. Right subclavian central line tip in the right atrium. No pneumothorax. 2. Increasing hazy opacity at the lung bases likely pleural effusions and atelectasis. Electronically Signed   By: Chadwick Colonel M.D.   On: 01/30/2024 15:23   DG Chest Port 1 View Result Date: 01/30/2024 CLINICAL DATA:  Acute respiratory failure with hypoxia [facility]) [id number] EXAM: PORTABLE CHEST 1 VIEW COMPARISON:   01/28/2024 FINDINGS: Endotracheal tube is 1.9 cm above the carina. Nasogastric tube extends into the abdomen but the tip is beyond the image. Cardiac pad was removed from the left chest. Increased densities in the mid and lower lungs. Patient is rotated towards the left. Difficult to assess cardiac silhouette size. Negative for a pneumothorax. IMPRESSION: 1. Increased densities in the mid and lower lungs. Findings are suggestive for increased atelectasis and possibly bilateral pleural effusions. Electronically Signed   By: Elene Griffes M.D.   On: 01/30/2024 08:56   ECHOCARDIOGRAM COMPLETE Result Date: 01/29/2024    ECHOCARDIOGRAM REPORT   Patient Name:   Tanya Frye Date of Exam: 01/29/2024 Medical Rec #:  [id number]    Height:       64.0 in Accession #:    [id number]   Weight:       202.6 lb Date of Birth:  [birth year]    BSA:          1.967 m Patient Age:    82 years     BP:           Not listed in chart/Not listed in                                            chart mmHg Patient Gender: F            HR:           97 bpm. Exam Location:  [facility] Procedure: 2D Echo, Cardiac Doppler and Color Doppler (Both Spectral and Color            Flow Doppler were utilized during procedure). STAT ECHO Indications:     Acute respiratory distress R06.03  History:         Patient has no prior history of Echocardiogram examinations.                  CHF; Risk Factors:Diabetes and Hypertension.  Sonographer:     Broadus Canes Referring Phys:  [id number] [name] Diagnosing Phys: Belva Boyden MD  Sonographer Comments: Echo performed with patient supine and on artificial respirator, no apical window and suboptimal subcostal window. IMPRESSIONS  1. Left ventricular ejection fraction, by estimation, is 25 to 30%. The left ventricle has  severely decreased function. The left ventricle demonstrates global hypokinesis with severe hypokinesis of teh basal to mid anterior/anteroseptal wall (apical region not well visualized). The left  ventricular internal cavity size was mildly dilated. Left ventricular diastolic parameters are indeterminate.  2. Right ventricular systolic function is normal. The right ventricular size is normal. There is normal pulmonary artery systolic pressure. The estimated right ventricular systolic pressure is 34.4 mmHg.  3. The mitral valve is normal in structure. Mild to moderate mitral valve regurgitation. No evidence of mitral stenosis. Moderate mitral annular calcification.  4. The aortic valve is normal in structure. There is moderate calcification of the aortic valve. Aortic valve regurgitation is not visualized. Aortic valve sclerosis/calcification is present, unable to exclude stenosis (gradient not measured.  5. The inferior vena cava is normal in size with greater than 50% respiratory variability, suggesting right atrial pressure of 3 mmHg. FINDINGS  Left Ventricle: Left ventricular ejection fraction, by estimation, is 25 to 30%. The left ventricle has severely decreased function. The left ventricle demonstrates global hypokinesis. Strain was performed and the global longitudinal strain is indeterminate. The left ventricular internal cavity size was mildly dilated. There is no left ventricular hypertrophy. Left ventricular diastolic parameters are indeterminate. Right Ventricle: The right ventricular size is normal. No increase in right ventricular wall thickness. Right ventricular systolic function is normal. There is normal pulmonary artery systolic pressure. The tricuspid regurgitant velocity is 2.71 m/s, and  with an assumed right atrial pressure of 5 mmHg, the estimated right ventricular systolic pressure is 34.4 mmHg. Left Atrium: Left atrial size was normal in size. Right Atrium: Right atrial size was normal in size. Pericardium: There is no evidence of pericardial effusion. Mitral Valve: The mitral valve is normal in structure. There is mild calcification of the mitral valve leaflet(s). Moderate mitral  annular calcification. Mild to moderate mitral valve regurgitation. No evidence of mitral valve stenosis. Tricuspid Valve: The tricuspid valve is normal in structure. Tricuspid valve regurgitation is mild . No evidence of tricuspid stenosis. Aortic Valve: The aortic valve is normal in structure. There is moderate calcification of the aortic valve. Aortic valve regurgitation is not visualized. Aortic valve sclerosis/calcification is present, without any evidence of aortic stenosis. Pulmonic Valve: The pulmonic valve was normal in structure. Pulmonic valve regurgitation is not visualized. No evidence of pulmonic stenosis. Aorta: The aortic root is normal in size and structure. Venous: The inferior vena cava is normal in size with greater than 50% respiratory variability, suggesting right atrial pressure of 3 mmHg. IAS/Shunts: No atrial level shunt detected by color flow Doppler. Additional Comments: 3D was performed not requiring image post processing on an independent workstation and was indeterminate.  LEFT VENTRICLE PLAX 2D LVIDd:         4.85 cm LVIDs:         4.15 cm LV PW:         0.85 cm LV IVS:        1.00 cm LVOT diam:     2.00 cm LVOT Area:     3.14 cm  LEFT ATRIUM         Index LA diam:    4.60 cm 2.34 cm/m   AORTA Ao Root diam: 2.60 cm TRICUSPID VALVE TR Peak grad:   29.4 mmHg TR Vmax:        271.00 cm/s  SHUNTS Systemic Diam: 2.00 cm Belva Boyden MD Electronically signed by Belva Boyden MD Signature Date/Time: 01/29/2024/2:52:49 PM    Final  DG Abd 1 View Result Date: 01/29/2024 CLINICAL DATA:  NG placement. EXAM: ABDOMEN - 1 VIEW COMPARISON:  Abdominal radiograph dated 01/28/2024. FINDINGS: Partially visualized enteric tube with tip and side port in the proximal stomach. IMPRESSION: Enteric tube with tip and side port in the proximal stomach. Electronically Signed   By: Angus Bark M.D.   On: 01/29/2024 11:52   DG Chest Port 1 View Result Date: 01/28/2024 CLINICAL DATA:  Intubation EXAM:  PORTABLE CHEST 1 VIEW COMPARISON:  Chest x-ray 01/28/2024 FINDINGS: Endotracheal tube tip is 2 cm above the carina. Enteric tube extends below the diaphragm. The heart is enlarged. There are small bilateral pleural effusions. There is central pulmonary vascular congestion. There is no pneumothorax. The heart is enlarged. No acute fractures are seen. There is no focal lung infiltrate. IMPRESSION: 1. Endotracheal tube tip is 2 cm above the carina. 2. Cardiomegaly with central pulmonary vascular congestion and small bilateral pleural effusions. Electronically Signed   By: Tyron Gallon M.D.   On: 01/28/2024 19:52   DG Abd 1 View Result Date: 01/28/2024 CLINICAL DATA:  Check gastric catheter placement EXAM: ABDOMEN - 1 VIEW COMPARISON:  None Available. FINDINGS: Gastric catheter is noted within the stomach. Scattered bowel gas is seen. No free air is noted. IMPRESSION: Gastric catheter within the stomach. Electronically Signed   By: Violeta Grey M.D.   On: 01/28/2024 19:46   CT Angio Abd/Pel w/ and/or w/o Result Date: 01/28/2024 CLINICAL DATA:  Mesenteric ischemia EXAM: CTA ABDOMEN AND PELVIS WITHOUT AND WITH CONTRAST TECHNIQUE: Multidetector CT imaging of the abdomen and pelvis was performed using the standard protocol during bolus administration of intravenous contrast. Multiplanar reconstructed images and MIPs were obtained and reviewed to evaluate the vascular anatomy. RADIATION DOSE REDUCTION: This exam was performed according to the departmental dose-optimization program which includes automated exposure control, adjustment of the mA and/or kV according to patient size and/or use of iterative reconstruction technique. CONTRAST:  OMNIPAQUE  IOHEXOL  350 MG/ML SOLN COMPARISON:  CT abdomen and pelvis January 12, 2015 FINDINGS: VASCULAR Aorta: No arterial occlusion with diffuse atheromatous of the distal infrarenal abdominal aorta at the bifurcation with diffuse atheromatous. Celiac: Patent without evidence of  aneurysm, dissection, vasculitis or significant stenosis. SMA: Patent without evidence of aneurysm, dissection, vasculitis or significant stenosis. Renals: Both renal arteries are patent without evidence of aneurysm, dissection, vasculitis, fibromuscular dysplasia or significant stenosis. IMA: Patent without evidence of aneurysm, dissection, vasculitis or significant stenosis. Inflow: Patent without evidence of aneurysm, dissection, vasculitis or significant stenosis. Proximal Outflow: Bilateral common femoral and visualized portions of the superficial and profunda femoral arteries are patent without evidence of aneurysm, dissection, vasculitis or significant stenosis. Veins: No obvious venous abnormality within the limitations of this arterial phase study. Review of the MIP images confirms the above findings. NON-VASCULAR Lower chest: Bilateral pleural effusion with basilar infiltrates and atelectasis Hepatobiliary: Diffuse fatty liver changes without parenchymal lesions or biliary dilatation. Prior cholecystectomy Pancreas: Unremarkable. No pancreatic ductal dilatation or surrounding inflammatory changes. Spleen: Normal in size without focal abnormality. Adrenals/Urinary Tract: Adrenal glands are unremarkable. Kidneys are normal, without renal calculi, focal lesion, or hydronephrosis. Bladder is unremarkable. Stomach/Bowel: Stomach is within normal limits. Appendix appears normal. No evidence of bowel wall thickening, distention, or inflammatory changes. No definitive evidence of ischemic colitis Lymphatic: Aortic atherosclerosis. No enlarged abdominal or pelvic lymph nodes. Reproductive: Status post hysterectomy. No adnexal masses. Other: No abdominal wall hernia or abnormality. No abdominopelvic ascites. Musculoskeletal: No acute or significant osseous findings.  IMPRESSION: *Diffuse fatty liver changes. *No definitive evidence of ischemic colitis. *Aortic atherosclerosis. NON-VASCULAR Electronically Signed   By:  Fredrich Jefferson M.D.   On: 01/28/2024 17:04   CT Angio Chest Pulmonary Embolism (PE) W or WO Contrast Result Date: 01/28/2024 CLINICAL DATA:  Pulmonary embolism EXAM: CT ANGIOGRAPHY CHEST WITH CONTRAST TECHNIQUE: Multidetector CT imaging of the chest was performed using the standard protocol during bolus administration of intravenous contrast. Multiplanar CT image reconstructions and MIPs were obtained to evaluate the vascular anatomy. RADIATION DOSE REDUCTION: This exam was performed according to the departmental dose-optimization program which includes automated exposure control, adjustment of the mA and/or kV according to patient size and/or use of iterative reconstruction technique. CONTRAST:  OMNIPAQUE  IOHEXOL  350 MG/ML SOLN COMPARISON:  None Available. FINDINGS: Cardiovascular: Satisfactory opacification of the pulmonary arteries to the segmental level. No evidence of pulmonary embolism. Normal heart size. No pericardial effusion. Coronary artery calcifications Mediastinum/Nodes: No enlarged mediastinal, hilar, or axillary lymph nodes. Thyroid  gland, trachea, and esophagus demonstrate no significant findings. Lungs/Pleura: Medium to large sized bilateral pleural effusions right larger than left with basilar infiltrates and atelectasis and bilateral interstitial prominence that could correlate with congestive changes, superimposed pneumonia not excluded. No pulmonary nodules or masses. Upper Abdomen: No acute abnormality. Musculoskeletal: No chest wall abnormality. No acute or significant osseous findings. Review of the MIP images confirms the above findings. IMPRESSION: *No evidence of pulmonary embolism. *Medium to large sized bilateral pleural effusions right larger than left with basilar infiltrates and atelectasis and bilateral interstitial prominence that could correlate with congestive changes, superimposed pneumonia not excluded. Electronically Signed   By: Fredrich Jefferson M.D.   On: 01/28/2024  17:02   DG Chest Port 1 View Result Date: 01/28/2024 CLINICAL DATA:  Sepsis. EXAM: PORTABLE CHEST 1 VIEW COMPARISON:  Chest radiograph dated 01/03/2018. FINDINGS: There is mild cardiomegaly and vascular congestion. Small bilateral pleural effusions and bibasilar atelectasis or infiltrate. No pneumothorax. Evaluation however is very limited due to superimposition of the patient's mandible over the upper chest. No acute osseous pathology. IMPRESSION: 1. Mild cardiomegaly and vascular congestion. 2. Small bilateral pleural effusions and bibasilar atelectasis or infiltrate. Electronically Signed   By: Angus Bark M.D.   On: 01/28/2024 10:31      Labs: BNP (last 3 results) Recent Labs    01/28/24 0914  BNP 251.9*   Basic Metabolic Panel: Recent Labs  Lab 02/07/24 0543 02/08/24 0507 02/09/24 0433 02/10/24 0440 02/11/24 0533 02/12/24 0750  NA 134* 135 133* 132* 134* 134*  K 3.2* 3.2* 3.2* 3.6 3.5 3.9  CL 87* 92* 95* 96* 96* 98  CO2 33* 31 27 26 24 26   GLUCOSE 173* 106* 147* 162* 220* 170*  BUN 89* 79* 65* 57* 53* 45*  CREATININE 1.32* 1.24* 0.99 0.93 0.92 0.96  CALCIUM 10.5* 10.2 9.7 9.3 9.7 9.1  MG 2.3 2.1  --   --   --   --   PHOS 6.1*  --   --   --   --  3.5   Liver Function Tests: Recent Labs  Lab 02/07/24 0543 02/12/24 0750  ALBUMIN 3.7 3.5   No results for input(s): "LIPASE", "AMYLASE" in the last 168 hours. No results for input(s): "AMMONIA" in the last 168 hours. CBC: Recent Labs  Lab 02/07/24 0543 02/08/24 0507 02/09/24 0433 02/10/24 0440 02/11/24 0533  WBC 15.1* 14.9* 13.3* 17.5* 21.6*  HGB 11.6* 11.5* 11.4* 11.3* 12.1  HCT 36.7 36.2 35.8* 35.2* 37.2  MCV 84.8  84.6 84.6 83.6 83.4  PLT 331 339 318 325 373   Cardiac Enzymes: No results for input(s): "CKTOTAL", "CKMB", "CKMBINDEX", "TROPONINI" in the last 168 hours. BNP: Invalid input(s): "POCBNP" CBG: Recent Labs  Lab 02/12/24 1135 02/12/24 1552 02/12/24 1933 02/13/24 0747 02/13/24 1143   GLUCAP 230* 199* 243* 178* 225*   D-Dimer No results for input(s): "DDIMER" in the last 72 hours. Hgb A1c No results for input(s): "HGBA1C" in the last 72 hours. Lipid Profile No results for input(s): "CHOL", "HDL", "LDLCALC", "TRIG", "CHOLHDL", "LDLDIRECT" in the last 72 hours. Thyroid  function studies No results for input(s): "TSH", "T4TOTAL", "T3FREE", "THYROIDAB" in the last 72 hours.  Invalid input(s): "FREET3" Anemia work up No results for input(s): "VITAMINB12", "FOLATE", "FERRITIN", "TIBC", "IRON", "RETICCTPCT" in the last 72 hours. Urinalysis    Component Value Date/Time   COLORURINE YELLOW (A) 01/31/2024 1537   APPEARANCEUR TURBID (A) 01/31/2024 1537   LABSPEC 1.039 (H) 01/31/2024 1537   PHURINE 5.0 01/31/2024 1537   GLUCOSEU NEGATIVE 01/31/2024 1537   HGBUR NEGATIVE 01/31/2024 1537   BILIRUBINUR NEGATIVE 01/31/2024 1537   KETONESUR 5 (A) 01/31/2024 1537   PROTEINUR 100 (A) 01/31/2024 1537   NITRITE NEGATIVE 01/31/2024 1537   LEUKOCYTESUR NEGATIVE 01/31/2024 1537   Sepsis Labs Recent Labs  Lab 02/08/24 0507 02/09/24 0433 02/10/24 0440 02/11/24 0533  WBC 14.9* 13.3* 17.5* 21.6*   Microbiology No results found for this or any previous visit (from the past 240 hours).   Total time spend on discharging this patient, including the last patient exam, discussing the hospital stay, instructions for ongoing care as it relates to all pertinent caregivers, as well as preparing the medical discharge records, prescriptions, and/or referrals as applicable, is 35 minutes.    Garrison Kanner, MD  [facility] 02/13/2024, 1:01 PM

## 2024-02-13 NOTE — Plan of Care (Signed)
   Problem: Education: Goal: Knowledge of General Education information will improve Description Including pain rating scale, medication(s)/side effects and non-pharmacologic comfort measures Outcome: Progressing   Problem: Health Behavior/Discharge Planning: Goal: Ability to manage health-related needs will improve Outcome: Progressing

## 2024-02-13 NOTE — Progress Notes (Signed)
 Physical Therapy Treatment Patient Details Name: Tanya Frye MRN: [id number] DOB: Dec 14, 1940 Today's Date: 02/13/2024   History of Present Illness 83 y/o female presented to ED on 01/28/24 for SOB. Intubated 5/7-5/16 (1 way extubation). Admitted with septic shock with progressive multi organ failure. PMH: COPD, CHF, HTN, T2DM    PT Comments  Patient received in bed, she is HOH. Daughter in law at bedside who reports patient ambulated slow prior to admission, but did not use AD. She lives with grandchild. Patient is agreeable to PT/OT session. She initiated rolling to her left side to sit up. Requiring mod +2 to complete rolling and max +2 to sit up. Poor sitting balance initially. Patient reporting significant pain from pure wick. She was able to stand with stedy and +2 max Assist to get bottom cleaned and pure wick removed. She will continue to benefit from skilled PT to improve mobility, strength and safety.       If plan is discharge home, recommend the following: Two people to help with walking and/or transfers;A lot of help with bathing/dressing/bathroom   Can travel by private vehicle     No  Equipment Recommendations  Other (comment) (TBD)    Recommendations for Other Services       Precautions / Restrictions Precautions Precautions: Fall Recall of Precautions/Restrictions: Impaired Restrictions Weight Bearing Restrictions Per Provider Order: No     Mobility  Bed Mobility   Bed Mobility: Rolling, Sidelying to Sit Rolling: Mod assist, +2 for physical assistance, Used rails Sidelying to sit: Max assist, +2 for physical assistance, HOB elevated, Used rails   Sit to supine: Max assist   General bed mobility comments: Patient with ability to reach for bed rail and assist with rolling. Needed +2 assist for side lying to sit. Poor initial sitting balance    Transfers Overall transfer level: Needs assistance Equipment used: Ambulation equipment used Transfers: Sit to/from  Stand Sit to Stand: Max assist, +2 physical assistance, From elevated surface, Via lift equipment           General transfer comment: patient able to stand with max A +2 and stedy x 2 reps from bed to clean bottom.    Ambulation/Gait               General Gait Details: unable   Stairs             Wheelchair Mobility     Tilt Bed    Modified Rankin (Stroke Patients Only)       Balance Overall balance assessment: Needs assistance Sitting-balance support: Feet supported Sitting balance-Leahy Scale: Poor Sitting balance - Comments: required up to maxA to maintain sitting balance Postural control: Left lateral lean Standing balance support: Bilateral upper extremity supported, During functional activity, Reliant on assistive device for balance Standing balance-Leahy Scale: Poor Standing balance comment: heavy use of AD for standing balance and Max +2                            Communication Communication Communication: Impaired Factors Affecting Communication: Hearing impaired  Cognition Arousal: Alert Behavior During Therapy: WFL for tasks assessed/performed   PT - Cognitive impairments: No apparent impairments Difficult to assess due to: Hard of hearing/deaf                     PT - Cognition Comments: A & O x 4 Following commands: Impaired Following commands impaired: Follows one step commands  inconsistently, Follows one step commands with increased time    Cueing Cueing Techniques: Verbal cues, Gestural cues, Tactile cues  Exercises      General Comments        Pertinent Vitals/Pain Pain Assessment Pain Assessment: Faces Faces Pain Scale: Hurts little more Pain Location: bottom, pure wick Pain Descriptors / Indicators: Discomfort, Sore Pain Intervention(s): Monitored during session, Repositioned    Home Living                          Prior Function            PT Goals (current goals can now be found  in the care plan section) Acute Rehab PT Goals Patient Stated Goal: to improve PT Goal Formulation: With family Time For Goal Achievement: 02/21/24 Potential to Achieve Goals: Good Progress towards PT goals: Progressing toward goals    Frequency    Min 1X/week      PT Plan      Co-evaluation PT/OT/SLP Co-Evaluation/Treatment: Yes Reason for Co-Treatment: To address functional/ADL transfers;Necessary to address cognition/behavior during functional activity;For patient/therapist safety PT goals addressed during session: Mobility/safety with mobility;Balance;Proper use of DME        AM-PAC PT "6 Clicks" Mobility   Outcome Measure  Help needed turning from your back to your side while in a flat bed without using bedrails?: A Lot Help needed moving from lying on your back to sitting on the side of a flat bed without using bedrails?: A Lot Help needed moving to and from a bed to a chair (including a wheelchair)?: A Lot Help needed standing up from a chair using your arms (e.g., wheelchair or bedside chair)?: A Lot Help needed to walk in hospital room?: Total Help needed climbing 3-5 steps with a railing? : Total 6 Click Score: 10    End of Session   Activity Tolerance: Patient limited by fatigue;Patient limited by pain Patient left: in bed;with call bell/phone within reach;with bed alarm set;with family/visitor present Nurse Communication: Mobility status;Other (comment) (patient reporting soreness on bottom and with pure wick) PT Visit Diagnosis: Other abnormalities of gait and mobility (R26.89);Muscle weakness (generalized) (M62.81);Pain Pain - part of body:  (bottom)     Time: 1610-9604 PT Time Calculation (min) (ACUTE ONLY): 26 min  Charges:    $Therapeutic Activity: 8-22 mins PT General Charges $$ ACUTE PT VISIT: 1 Visit                     Valor [place], PT, GCS 02/13/24,11:10 AM

## 2024-02-13 NOTE — Inpatient Diabetes Management (Signed)
 Inpatient Diabetes Program Recommendations  AACE/ADA: New Consensus Statement on Inpatient Glycemic Control (2015)  Target Ranges:  Prepandial:   less than 140 mg/dL      Peak postprandial:   less than 180 mg/dL (1-2 hours)      Critically ill patients:  140 - 180 mg/dL   Lab Results  Component Value Date   GLUCAP 178 (H) 02/13/2024   HGBA1C 6.7 (H) 01/30/2024    Review of Glycemic Control  Latest Reference Range & Units 02/12/24 07:28 02/12/24 11:35 02/12/24 15:52 02/12/24 19:33 02/13/24 07:47  Glucose-Capillary 70 - 99 mg/dL 161 (H) 096 (H) 045 (H) 243 (H) 178 (H)   Diabetes history: DM 2 Outpatient Diabetes medications:  Metformin  1000 mg bid Current orders for Inpatient glycemic control:  Novolog  0-20 units q 4 hours Semglee  10 units bid  Note: glucose trends increase after PO intake. Pt also on Ensure Enlive bid between meals (40 grams of carbohydrates)  Inpatient Diabetes Program Recommendations:    -   Reduce Novolog  Correction to 0-15 units tid + hs due to age and renal function -   Add Novolog  4 units tid meal coverage if eating >50% of meals (hold if patient eats less than 50% or NPO).   Thanks,  Eloise Hake RN, MSN, BC-ADM Inpatient Diabetes Coordinator Team Pager [phone] (8a-5p)

## 2024-03-22 ENCOUNTER — Telehealth: Payer: Self-pay | Admitting: Cardiology

## 2024-03-22 NOTE — Telephone Encounter (Signed)
 Called to confirm/remind patient of their appointment at the [facility] on 03/23/24.   Appointment:   [] Confirmed  [x] Left mess   [] No answer/No voice mail  [] VM Full/unable to leave message  [] Phone not in service  Patient reminded to bring all medications and/or complete list.  Confirmed patient has transportation. Gave directions, instructed to utilize valet parking.

## 2024-03-23 ENCOUNTER — Encounter: Payer: Self-pay | Admitting: Cardiology

## 2024-03-23 ENCOUNTER — Ambulatory Visit: Attending: Cardiology | Admitting: Cardiology

## 2024-03-23 VITALS — BP 107/88 | HR 108

## 2024-03-23 DIAGNOSIS — E119 Type 2 diabetes mellitus without complications: Secondary | ICD-10-CM | POA: Insufficient documentation

## 2024-03-23 DIAGNOSIS — Z79899 Other long term (current) drug therapy: Secondary | ICD-10-CM | POA: Insufficient documentation

## 2024-03-23 DIAGNOSIS — Z7901 Long term (current) use of anticoagulants: Secondary | ICD-10-CM | POA: Insufficient documentation

## 2024-03-23 DIAGNOSIS — Z515 Encounter for palliative care: Secondary | ICD-10-CM | POA: Diagnosis not present

## 2024-03-23 DIAGNOSIS — Z794 Long term (current) use of insulin: Secondary | ICD-10-CM | POA: Insufficient documentation

## 2024-03-23 DIAGNOSIS — I420 Dilated cardiomyopathy: Secondary | ICD-10-CM | POA: Diagnosis not present

## 2024-03-23 DIAGNOSIS — I4819 Other persistent atrial fibrillation: Secondary | ICD-10-CM | POA: Insufficient documentation

## 2024-03-23 DIAGNOSIS — I5022 Chronic systolic (congestive) heart failure: Secondary | ICD-10-CM | POA: Diagnosis present

## 2024-03-23 MED ORDER — AMIODARONE HCL 200 MG PO TABS: 200.0000 mg | ORAL_TABLET | Freq: Every day | ORAL | 3 refills | Status: DC

## 2024-03-23 NOTE — Patient Instructions (Addendum)
 Medication Changes:  STOP ASPIRIN , METFORMIN  AND SALT TABLETS  DECREASE AMIODARONE  200 MG ONCE DAILY   Follow-Up in: 4 MONTHS WITH DR. ZENAIDA.  Our Doctors' schedules are NOT open yet for 4 months. We will place you on our recall list. Once they are available, we will call you to schedule your follow up appointment.   At the [facility], you and your health needs are our priority. We have a designated team specialized in the treatment of Heart Failure. This Care Team includes your primary Heart Failure Specialized Cardiologist (physician), Advanced Practice Providers (APPs- Physician Assistants and Nurse Practitioners), and Pharmacist who all work together to provide you with the care you need, when you need it.   You may see any of the following providers on your designated Care Team at your next follow up:  Dr. Toribio Fuel Dr. Ezra Shuck Dr. Ria Commander Dr. Odis ZENAIDA Ellouise Class, FNP Jaun Bash, RPH-CPP  Please be sure to bring in all your medications bottles to every appointment.   Need to Contact Us :  If you have any questions or concerns before your next appointment please send us  a message through [facility] or call our office at [phone].    TO LEAVE A MESSAGE FOR THE NURSE SELECT OPTION 2, PLEASE LEAVE A MESSAGE INCLUDING: YOUR NAME DATE OF BIRTH CALL BACK NUMBER REASON FOR CALL**this is important as we prioritize the call backs  YOU WILL RECEIVE A CALL BACK THE SAME DAY AS LONG AS YOU CALL BEFORE 4:00 PM

## 2024-03-25 NOTE — Progress Notes (Signed)
   ADVANCED HEART FAILURE FOLLOW UP CLINIC NOTE  Referring Physician: No ref. provider found  Primary Care: Pcp, No Primary Cardiologist:  HPI: Tanya Frye is a 83 y.o. female who presents for follow up of chronic systolic heart failure.      Patient recently presented to the hospital in May 2025 with shortness of breath.  She developed profound cardiogenic shock requiring multiple vasopressors and inotropes.  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  After prolonged ventilatory wean and aggressive IV diuresis she was extubated after discussion with palliative care and hospice.  She improved slowly throughout the hospitalization and was discharged to rehab.     SUBJECTIVE:  Patient accompanied by her family member, she remains profoundly weak although has been improving over the past week.  She was able to get up and move around the other day, though with assistance.  She remains in atrial fibrillation. No significant edema or swelling. No bleeding issues. She is having some difficulty taking her medications as well as swallowing her pills.  She was asking if there is any medication she could come off of at this time.  PMH, current medications, allergies, social history, and family history reviewed in epic.  PHYSICAL EXAM: Vitals:   03/23/24 1540  BP: 107/88  Pulse: (!) 108  SpO2: 99%   GENERAL: Chronically ill-appearing, frail, hard of hearing PULM:  Normal work of breathing, clear to auscultation bilaterally. Respirations are unlabored.  CARDIAC:  JVP: Flat         Irregular rate and rhythm. No murmurs, rubs or gallops.  1+ lower extremity edema. Warm and well perfused extremities. ABDOMEN: Soft, non-tender, non-distended. NEUROLOGIC: Patient is oriented x3 with no focal or lateralizing neurologic deficits.    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  Fairly well compensated currently, symptoms difficult to differentiate from debility following long ICU admission. - Continue low-dose losartan  12.5 mg daily - Continue spironolactone  25 mg daily - Holding beta-blocker currently given recent inotrope dependence - Continue torsemide  40 mg daily  Afib: Persistent, rate controlled at this time.  Risk-benefit discussion with patient and family, would not pursue cardioversion or procedures at this time. - Reduce amiodarone  to 200 mg daily for rate control - Consider starting beta-blocker at next visit - Continue apixaban  5 mg twice daily - Stop aspirin   DMII:  - Stop metformin  given difficulty taking pills - Continue Lantus  10 units twice daily  Palliative care: -  - Extensive discussions while inpatient, appreciate palliative care following  Follow up in 3 months  Tanya Brownie, MD [facility] Mechanical Circulatory Support 03/25/24

## 2024-05-09 ENCOUNTER — Emergency Department

## 2024-05-09 ENCOUNTER — Other Ambulatory Visit: Payer: Self-pay

## 2024-05-09 ENCOUNTER — Inpatient Hospital Stay
Admission: EM | Admit: 2024-05-09 | Discharge: 2024-05-14 | DRG: 291 | Disposition: A | Discharge status: Skilled Nursing Facility | Source: Skilled Nursing Facility | Attending: Internal Medicine | Admitting: Internal Medicine

## 2024-05-09 DIAGNOSIS — H9193 Unspecified hearing loss, bilateral: Secondary | ICD-10-CM | POA: Diagnosis present

## 2024-05-09 DIAGNOSIS — E11649 Type 2 diabetes mellitus with hypoglycemia without coma: Secondary | ICD-10-CM | POA: Diagnosis not present

## 2024-05-09 DIAGNOSIS — Z555 Less than a high school diploma: Secondary | ICD-10-CM

## 2024-05-09 DIAGNOSIS — Z825 Family history of asthma and other chronic lower respiratory diseases: Secondary | ICD-10-CM

## 2024-05-09 DIAGNOSIS — Z90710 Acquired absence of both cervix and uterus: Secondary | ICD-10-CM

## 2024-05-09 DIAGNOSIS — E1122 Type 2 diabetes mellitus with diabetic chronic kidney disease: Secondary | ICD-10-CM | POA: Diagnosis present

## 2024-05-09 DIAGNOSIS — E0840 Diabetes mellitus due to underlying condition with diabetic neuropathy, unspecified: Secondary | ICD-10-CM | POA: Diagnosis present

## 2024-05-09 DIAGNOSIS — J449 Chronic obstructive pulmonary disease, unspecified: Secondary | ICD-10-CM

## 2024-05-09 DIAGNOSIS — I10 Essential (primary) hypertension: Secondary | ICD-10-CM | POA: Diagnosis present

## 2024-05-09 DIAGNOSIS — I130 Hypertensive heart and chronic kidney disease with heart failure and stage 1 through stage 4 chronic kidney disease, or unspecified chronic kidney disease: Secondary | ICD-10-CM | POA: Diagnosis not present

## 2024-05-09 DIAGNOSIS — I5023 Acute on chronic systolic (congestive) heart failure: Secondary | ICD-10-CM | POA: Diagnosis present

## 2024-05-09 DIAGNOSIS — R5381 Other malaise: Secondary | ICD-10-CM | POA: Diagnosis present

## 2024-05-09 DIAGNOSIS — R0602 Shortness of breath: Secondary | ICD-10-CM | POA: Diagnosis not present

## 2024-05-09 DIAGNOSIS — I4892 Unspecified atrial flutter: Secondary | ICD-10-CM | POA: Insufficient documentation

## 2024-05-09 DIAGNOSIS — Z794 Long term (current) use of insulin: Secondary | ICD-10-CM

## 2024-05-09 DIAGNOSIS — I872 Venous insufficiency (chronic) (peripheral): Secondary | ICD-10-CM | POA: Diagnosis present

## 2024-05-09 DIAGNOSIS — Z6830 Body mass index (BMI) 30.0-30.9, adult: Secondary | ICD-10-CM

## 2024-05-09 DIAGNOSIS — I483 Typical atrial flutter: Secondary | ICD-10-CM | POA: Diagnosis present

## 2024-05-09 DIAGNOSIS — J441 Chronic obstructive pulmonary disease with (acute) exacerbation: Secondary | ICD-10-CM | POA: Diagnosis present

## 2024-05-09 DIAGNOSIS — E039 Hypothyroidism, unspecified: Secondary | ICD-10-CM | POA: Diagnosis present

## 2024-05-09 DIAGNOSIS — I4430 Unspecified atrioventricular block: Secondary | ICD-10-CM | POA: Diagnosis present

## 2024-05-09 DIAGNOSIS — Z66 Do not resuscitate: Secondary | ICD-10-CM | POA: Diagnosis present

## 2024-05-09 DIAGNOSIS — Z7901 Long term (current) use of anticoagulants: Secondary | ICD-10-CM

## 2024-05-09 DIAGNOSIS — Z79890 Hormone replacement therapy (postmenopausal): Secondary | ICD-10-CM

## 2024-05-09 DIAGNOSIS — J9621 Acute and chronic respiratory failure with hypoxia: Secondary | ICD-10-CM | POA: Diagnosis present

## 2024-05-09 DIAGNOSIS — N1832 Chronic kidney disease, stage 3b: Secondary | ICD-10-CM | POA: Diagnosis present

## 2024-05-09 DIAGNOSIS — J9601 Acute respiratory failure with hypoxia: Principal | ICD-10-CM

## 2024-05-09 DIAGNOSIS — E669 Obesity, unspecified: Secondary | ICD-10-CM | POA: Diagnosis present

## 2024-05-09 DIAGNOSIS — Z79899 Other long term (current) drug therapy: Secondary | ICD-10-CM

## 2024-05-09 DIAGNOSIS — E872 Acidosis, unspecified: Secondary | ICD-10-CM | POA: Diagnosis present

## 2024-05-09 DIAGNOSIS — E876 Hypokalemia: Secondary | ICD-10-CM | POA: Diagnosis present

## 2024-05-09 DIAGNOSIS — L89151 Pressure ulcer of sacral region, stage 1: Secondary | ICD-10-CM | POA: Diagnosis present

## 2024-05-09 DIAGNOSIS — Z880 Allergy status to penicillin: Secondary | ICD-10-CM

## 2024-05-09 DIAGNOSIS — I5043 Acute on chronic combined systolic (congestive) and diastolic (congestive) heart failure: Secondary | ICD-10-CM

## 2024-05-09 DIAGNOSIS — I420 Dilated cardiomyopathy: Secondary | ICD-10-CM | POA: Diagnosis present

## 2024-05-09 DIAGNOSIS — I4891 Unspecified atrial fibrillation: Secondary | ICD-10-CM | POA: Diagnosis present

## 2024-05-09 DIAGNOSIS — Z841 Family history of disorders of kidney and ureter: Secondary | ICD-10-CM

## 2024-05-09 DIAGNOSIS — I2489 Other forms of acute ischemic heart disease: Secondary | ICD-10-CM | POA: Diagnosis present

## 2024-05-09 DIAGNOSIS — J9600 Acute respiratory failure, unspecified whether with hypoxia or hypercapnia: Secondary | ICD-10-CM | POA: Diagnosis present

## 2024-05-09 DIAGNOSIS — I5022 Chronic systolic (congestive) heart failure: Secondary | ICD-10-CM | POA: Insufficient documentation

## 2024-05-09 DIAGNOSIS — I4819 Other persistent atrial fibrillation: Secondary | ICD-10-CM | POA: Diagnosis present

## 2024-05-09 DIAGNOSIS — I509 Heart failure, unspecified: Secondary | ICD-10-CM

## 2024-05-09 LAB — CBG MONITORING, ED
Glucose-Capillary: 153 mg/dL — ABNORMAL HIGH (ref 70–99)
Glucose-Capillary: 151 mg/dL — ABNORMAL HIGH (ref 70–99)
Glucose-Capillary: 150 mg/dL — ABNORMAL HIGH (ref 70–99)
Glucose-Capillary: 140 mg/dL — ABNORMAL HIGH (ref 70–99)

## 2024-05-09 LAB — COMPREHENSIVE METABOLIC PANEL WITH GFR
ALT: 12 U/L (ref 0–44)
ALT: 12 U/L (ref 0–44)
AST: 21 U/L (ref 15–41)
AST: 24 U/L (ref 15–41)
Albumin: 3.3 g/dL — ABNORMAL LOW (ref 3.5–5.0)
Albumin: 3 g/dL — ABNORMAL LOW (ref 3.5–5.0)
Alkaline Phosphatase: 37 U/L — ABNORMAL LOW (ref 38–126)
Alkaline Phosphatase: 34 U/L — ABNORMAL LOW (ref 38–126)
Anion gap: 11 (ref 5–15)
Anion gap: 15 (ref 5–15)
BUN: 11 mg/dL (ref 8–23)
BUN: 12 mg/dL (ref 8–23)
CO2: 24 mmol/L (ref 22–32)
CO2: 23 mmol/L (ref 22–32)
Calcium: 9.2 mg/dL (ref 8.9–10.3)
Calcium: 9.2 mg/dL (ref 8.9–10.3)
Chloride: 105 mmol/L (ref 98–111)
Chloride: 103 mmol/L (ref 98–111)
Creatinine, Ser: 1.22 mg/dL — ABNORMAL HIGH (ref 0.44–1.00)
Creatinine, Ser: 1.42 mg/dL — ABNORMAL HIGH (ref 0.44–1.00)
GFR, Estimated: 44 mL/min — ABNORMAL LOW (ref >60)
GFR, Estimated: 37 mL/min — ABNORMAL LOW (ref >60)
Glucose, Bld: 179 mg/dL — ABNORMAL HIGH (ref 70–99)
Glucose, Bld: 164 mg/dL — ABNORMAL HIGH (ref 70–99)
Potassium: 3.4 mmol/L — ABNORMAL LOW (ref 3.5–5.1)
Potassium: 3.4 mmol/L — ABNORMAL LOW (ref 3.5–5.1)
Sodium: 140 mmol/L (ref 135–145)
Sodium: 141 mmol/L (ref 135–145)
Total Bilirubin: 0.8 mg/dL (ref 0.0–1.2)
Total Bilirubin: 0.8 mg/dL (ref 0.0–1.2)
Total Protein: 6.6 g/dL (ref 6.5–8.1)
Total Protein: 6.2 g/dL — ABNORMAL LOW (ref 6.5–8.1)

## 2024-05-09 LAB — URINALYSIS, COMPLETE (UACMP) WITH MICROSCOPIC
Bacteria, UA: NONE SEEN
Bilirubin Urine: NEGATIVE
Glucose, UA: NEGATIVE mg/dL
Hgb urine dipstick: NEGATIVE
Ketones, ur: NEGATIVE mg/dL
Leukocytes,Ua: NEGATIVE
Nitrite: NEGATIVE
Protein, ur: NEGATIVE mg/dL
Specific Gravity, Urine: 1.006 (ref 1.005–1.030)
pH: 7 (ref 5.0–8.0)

## 2024-05-09 LAB — BLOOD GAS, VENOUS
Acid-Base Excess: 0.2 mmol/L (ref 0.0–2.0)
Bicarbonate: 26.9 mmol/L (ref 20.0–28.0)
O2 Content: 3 L/min
O2 Saturation: 84.3 %
Patient temperature: 37
pCO2, Ven: 51 mmHg (ref 44–60)
pH, Ven: 7.33 (ref 7.25–7.43)
pO2, Ven: 54 mmHg — ABNORMAL HIGH (ref 32–45)

## 2024-05-09 LAB — RESP PANEL BY RT-PCR (RSV, FLU A&B, COVID)  RVPGX2
Influenza A by PCR: NEGATIVE
Influenza B by PCR: NEGATIVE
Resp Syncytial Virus by PCR: NEGATIVE
SARS Coronavirus 2 by RT PCR: NEGATIVE

## 2024-05-09 LAB — CBC WITH DIFFERENTIAL/PLATELET
Abs Immature Granulocytes: 0.04 K/uL (ref 0.00–0.07)
Basophils Absolute: 0.2 K/uL — ABNORMAL HIGH (ref 0.0–0.1)
Basophils Relative: 2 %
Eosinophils Absolute: 0.5 K/uL (ref 0.0–0.5)
Eosinophils Relative: 4 %
HCT: 44 % (ref 36.0–46.0)
Hemoglobin: 14 g/dL (ref 12.0–15.0)
Immature Granulocytes: 0 %
Lymphocytes Relative: 31 %
Lymphs Abs: 4.1 K/uL — ABNORMAL HIGH (ref 0.7–4.0)
MCH: 29.1 pg (ref 26.0–34.0)
MCHC: 31.8 g/dL (ref 30.0–36.0)
MCV: 91.5 fL (ref 80.0–100.0)
Monocytes Absolute: 0.8 K/uL (ref 0.1–1.0)
Monocytes Relative: 6 %
Neutro Abs: 7.6 K/uL (ref 1.7–7.7)
Neutrophils Relative %: 57 %
Platelets: 384 K/uL (ref 150–400)
RBC: 4.81 MIL/uL (ref 3.87–5.11)
RDW: 16.5 % — ABNORMAL HIGH (ref 11.5–15.5)
WBC: 13.2 K/uL — ABNORMAL HIGH (ref 4.0–10.5)
nRBC: 0 % (ref 0.0–0.2)

## 2024-05-09 LAB — BRAIN NATRIURETIC PEPTIDE: B Natriuretic Peptide: 337.3 pg/mL — ABNORMAL HIGH (ref 0.0–100.0)

## 2024-05-09 LAB — PROTIME-INR
INR: 1.4 — ABNORMAL HIGH (ref 0.8–1.2)
Prothrombin Time: 17.9 s — ABNORMAL HIGH (ref 11.4–15.2)

## 2024-05-09 LAB — LACTIC ACID, PLASMA
Lactic Acid, Venous: 2.1 mmol/L (ref 0.5–1.9)
Lactic Acid, Venous: 2.1 mmol/L (ref 0.5–1.9)

## 2024-05-09 LAB — TROPONIN I (HIGH SENSITIVITY)
Troponin I (High Sensitivity): 14 ng/L (ref <18)
Troponin I (High Sensitivity): 121 ng/L (ref <18)

## 2024-05-09 LAB — LIPASE, BLOOD: Lipase: 41 U/L (ref 11–51)

## 2024-05-09 MED ORDER — INSULIN ASPART 100 UNIT/ML IJ SOLN
0.0000 [IU] | Freq: Every day | INTRAMUSCULAR | Status: DC
  Filled 2024-05-09: qty 1

## 2024-05-09 MED ORDER — ALBUTEROL SULFATE (2.5 MG/3ML) 0.083% IN NEBU: 2.5000 mg | INHALATION_SOLUTION | RESPIRATORY_TRACT | Status: AC | PRN

## 2024-05-09 MED ORDER — FUROSEMIDE 10 MG/ML IJ SOLN
40.0000 mg | Freq: Once | INTRAMUSCULAR | Status: AC
  Administered 2024-05-09: 40 mg via INTRAVENOUS
  Filled 2024-05-09: qty 4

## 2024-05-09 MED ORDER — IPRATROPIUM-ALBUTEROL 0.5-2.5 (3) MG/3ML IN SOLN
3.0000 mL | Freq: Once | RESPIRATORY_TRACT | Status: AC
  Administered 2024-05-09: 3 mL via RESPIRATORY_TRACT
  Filled 2024-05-09: qty 3

## 2024-05-09 MED ORDER — INSULIN ASPART 100 UNIT/ML IJ SOLN
0.0000 [IU] | Freq: Three times a day (TID) | INTRAMUSCULAR | Status: DC
  Administered 2024-05-09 (×2): 3 [IU] via SUBCUTANEOUS
  Administered 2024-05-09 – 2024-05-10 (×2): 2 [IU] via SUBCUTANEOUS
  Administered 2024-05-11: 3 [IU] via SUBCUTANEOUS
  Administered 2024-05-12: 2 [IU] via SUBCUTANEOUS
  Filled 2024-05-09 (×2): qty 1
  Filled 2024-05-09: qty 3
  Filled 2024-05-09: qty 1
  Filled 2024-05-09: qty 3
  Filled 2024-05-09: qty 1

## 2024-05-09 MED ORDER — METHYLPREDNISOLONE SODIUM SUCC 125 MG IJ SOLR
125.0000 mg | Freq: Once | INTRAMUSCULAR | Status: AC
  Administered 2024-05-09: 125 mg via INTRAVENOUS
  Filled 2024-05-09: qty 2

## 2024-05-09 MED ORDER — ACETAMINOPHEN 650 MG RE SUPP: 650.0000 mg | Freq: Four times a day (QID) | RECTAL | Status: DC | PRN

## 2024-05-09 MED ORDER — ACETAMINOPHEN 325 MG PO TABS: 650.0000 mg | ORAL_TABLET | Freq: Four times a day (QID) | ORAL | Status: DC | PRN

## 2024-05-09 MED ORDER — POTASSIUM CHLORIDE 10 MEQ/100ML IV SOLN
10.0000 meq | INTRAVENOUS | Status: AC
  Administered 2024-05-09 (×3): 10 meq via INTRAVENOUS
  Filled 2024-05-09 (×3): qty 100

## 2024-05-09 MED ORDER — LEVOTHYROXINE SODIUM 50 MCG PO TABS
75.0000 ug | ORAL_TABLET | Freq: Every day | ORAL | Status: DC
  Administered 2024-05-10 – 2024-05-14 (×5): 75 ug via ORAL
  Filled 2024-05-09 (×5): qty 1

## 2024-05-09 MED ORDER — HYDROMORPHONE HCL 1 MG/ML IJ SOLN: 0.5000 mg | INTRAMUSCULAR | Status: AC | PRN

## 2024-05-09 MED ORDER — AMIODARONE HCL 200 MG PO TABS
200.0000 mg | ORAL_TABLET | Freq: Every day | ORAL | Status: DC
  Administered 2024-05-09 – 2024-05-10 (×2): 200 mg via ORAL
  Filled 2024-05-09 (×2): qty 1

## 2024-05-09 MED ORDER — INSULIN GLARGINE-YFGN 100 UNIT/ML ~~LOC~~ SOLN
10.0000 [IU] | Freq: Two times a day (BID) | SUBCUTANEOUS | Status: DC
  Administered 2024-05-09 – 2024-05-11 (×6): 10 [IU] via SUBCUTANEOUS
  Filled 2024-05-09 (×7): qty 0.1

## 2024-05-09 MED ORDER — ONDANSETRON HCL 4 MG/2ML IJ SOLN
4.0000 mg | Freq: Three times a day (TID) | INTRAMUSCULAR | Status: AC | PRN
Start: 2024-05-09 — End: 2024-05-09

## 2024-05-09 MED ORDER — APIXABAN 5 MG PO TABS
5.0000 mg | ORAL_TABLET | Freq: Two times a day (BID) | ORAL | Status: DC
  Administered 2024-05-09 – 2024-05-14 (×11): 5 mg via ORAL
  Filled 2024-05-09 (×11): qty 1

## 2024-05-09 NOTE — H&P (Signed)
 History and Physical    Tanya Frye FMW:[id number] DOB: April 20, 1941 DOA: 05/09/2024  DOS: the patient was seen and examined on 05/09/2024  PCP: Pcp, No   Patient coming from: SNF  I have personally briefly reviewed patient's old medical records in [facility]  Chief Complaint: Shortness of breath  HPI: BENTLEIGH WAREN is a pleasant 83 y.o. female with medical history significant for COPD, HFrEF last EF 25 to 30% in May 2025, HTN, DM, A-fib on Eliquis , hard of hearing who was recently admitted and discharged between 01/28/2024 and 02/13/2024 for shortness of breath now presented from skilled nursing facility for shortness of breath.  When she was admitted in May 2025 at [facility], she was treated for cardiogenic shock requiring pressors and milrinone  drip, spironolactone  and torsemide .  She was intubated and extubated and received treatment for community-acquired pneumonia with ceftriaxone  and azithromycin . Per EMS today patient was saturating 70% on room air at the facility and 97% on 4 L nasal cannula, then suddenly dropped to 85% on 4 L nasal cannula in route.  Significant rhonchi noted in her upper lobes.  Per patient was alert confused at baseline. ED provider spoke to daughter who was present at bedside.  Patient is DNR/DNI.  Patient is not able to provide meaningful history.  ED Course: Upon arrival to the ED, patient is found to be hypoxemic, leukocytosis at 13.2, potassium of 3.4, EKG shows sinus tach at 119 bpm no ST elevation, chest x-ray showed congestion and left-sided pleural effusion.  Patient received DuoNebs Lasix  40 mg IV, Dilaudid  and Solu-Medrol  125 mg x 1.  Hospitalist service was consulted for evaluation for admission.  Review of Systems:  ROS  All other systems negative except as noted in the HPI.  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   CHF (congestive heart failure) (HCC)    Diabetes mellitus without complication (HCC)    HOH  (hard of hearing)    extremely HOH   Hypertension    Palpitations    occasional related to meds/heart races    Past Surgical History:  Procedure Laterality Date   ABDOMINAL HYSTERECTOMY  1990   CATARACT EXTRACTION W/PHACO Right 12/20/2020   Procedure: CATARACT EXTRACTION PHACO AND INTRAOCULAR LENS PLACEMENT (IOC) RIGHT DIABETIC 8.13 01:17.6 10.5%;  Surgeon: Mittie Gaskin, MD;  Location: [facility];  Service: Ophthalmology;  Laterality: Right;   CATARACT EXTRACTION W/PHACO Left 01/03/2021   Procedure: CATARACT EXTRACTION PHACO AND INTRAOCULAR LENS PLACEMENT (IOC) LEFT DIABETIC  7.37 01:15.6 9.8%;  Surgeon: Mittie Gaskin, MD;  Location: [facility];  Service: Ophthalmology;  Laterality: Left;   CHOLECYSTECTOMY     DILATION AND CURETTAGE OF UTERUS  before 1990   X2   NECK SURGERY  03/30/2008   Per family, pt broke her neck   STOMACH SURGERY     tumor removed, per family     reports that she has never smoked. She has never used smokeless tobacco. She reports that she does not drink alcohol and does not use drugs.  Allergies  Allergen Reactions   Penicillins Rash    No family history on file.  Prior to Admission medications   Medication Sig Start Date End Date Taking? Authorizing Provider  acetaminophen  (TYLENOL ) 650 MG CR tablet Take 650 mg by mouth every 8 (eight) hours as needed for pain.   Yes [provider]  amiodarone  (PACERONE ) 200 MG tablet Take 1 tablet (200  mg total) by mouth daily. 03/23/24  Yes Zenaida Morene PARAS, MD  apixaban  (ELIQUIS ) 5 MG TABS tablet Take 1 tablet (5 mg total) by mouth 2 (two) times daily. 02/13/24  Yes Awanda City, MD  ascorbic acid  (VITAMIN C ) 500 MG tablet Take 500 mg by mouth daily.   Yes [provider]  calcium carbonate (OSCAL) 1500 (600 Ca) MG TABS tablet Take 1,500 mg by mouth in the morning and at bedtime.   Yes [provider]  docusate sodium  (COLACE) 100 MG capsule Take 100 mg by mouth 2  (two) times daily.   Yes [provider]  fluticasone  (FLONASE ) 50 MCG/ACT nasal spray Place 1 spray into both nostrils daily.   Yes [provider]  insulin  glargine-yfgn (SEMGLEE ) 100 UNIT/ML injection Inject 0.1 mLs (10 Units total) into the skin 2 (two) times daily. 02/13/24  Yes Awanda City, MD  levothyroxine  (SYNTHROID ) 75 MCG tablet Take 75 mcg by mouth daily before breakfast.   Yes [provider]  loratadine  (CLARITIN ) 10 MG tablet TAKE 1 TABLET BY MOUTH EVERY DAY 09/24/22  Yes Masoud, Javed, MD  lovastatin (MEVACOR) 10 MG tablet TAKE 1 TABLET ONCE A DAY ORALLY 10/20/17  Yes [provider]  Multiple Vitamin (MULTIVITAMIN WITH MINERALS) TABS tablet Take 1 tablet by mouth daily. 02/14/24  Yes Awanda City, MD  nystatin powder Apply 1 Application topically 2 (two) times daily. Apply Nystatin powder to red area under right breast and place ABD pad between breast and chest twice daily   Yes [provider]  Omega-3 Fatty Acids (FISH OIL) 500 MG CAPS Take 1,000 mg by mouth daily.   Yes [provider]  ondansetron  (ZOFRAN -ODT) 4 MG disintegrating tablet Take 4 mg by mouth every 8 (eight) hours as needed for nausea or vomiting.   Yes [provider]  phenol (CHLORASEPTIC) 1.4 % LIQD Use as directed 1 spray in the mouth or throat every 2 (two) hours as needed for throat irritation / pain.   Yes [provider]  polyethylene glycol (MIRALAX  / GLYCOLAX ) 17 g packet Take 17 g by mouth daily.   Yes [provider]  traMADol (ULTRAM) 50 MG tablet Take 50 mg by mouth every 8 (eight) hours as needed (unspecified pain).   Yes [provider]  trolamine salicylate (ASPERCREME) 10 % cream Apply 1 Application topically 3 (three) times daily. Apply to right knee three times a day.   Yes [provider]  feeding supplement (ENSURE ENLIVE / ENSURE PLUS) LIQD Take 237 mLs by mouth 2 (two) times daily between meals. 02/13/24    Awanda City, MD    Physical Exam: Vitals:   05/09/24 0530 05/09/24 0700 05/09/24 0830 05/09/24 0949  BP: 111/72 100/68 112/79   Pulse: (!) 122 (!) 120 (!) 119   Resp: 13 13 15    Temp:    97.7 F (36.5 C)  TempSrc:    Oral  SpO2: 100% 100% 100%   Weight:      Height:        Physical Exam   Constitutional: Alert, awake, calm, comfortable HEENT: Neck supple Respiratory: Clear to auscultation B/L, no wheezing, no rales.  Cardiovascular: Regular rate and rhythm, no murmurs / rubs / gallops. No extremity edema. 2+ pedal pulses. No carotid bruits.  Abdomen: Soft, no tenderness, Bowel sounds positive.  Musculoskeletal: no clubbing / cyanosis. Good ROM, no contractures. Normal muscle tone.  Skin: no rashes, lesions, ulcers. Neurologic: CN 2-12 grossly intact. Sensation intact,  No focal deficit identified Psychiatric: Alert and oriented x 3. Normal mood.    Labs on Admission: I have personally reviewed following labs and imaging studies  CBC: Recent Labs  Lab 05/09/24 0420  WBC 13.2*  NEUTROABS 7.6  HGB 14.0  HCT 44.0  MCV 91.5  PLT 384   Basic Metabolic Panel: Recent Labs  Lab 05/09/24 0420  NA 140  K 3.4*  CL 105  CO2 24  GLUCOSE 179*  BUN 11  CREATININE 1.22*  CALCIUM 9.2   GFR: Estimated Creatinine Clearance: 36.3 mL/min (A) (by C-G formula based on SCr of 1.22 mg/dL (H)). Liver Function Tests: Recent Labs  Lab 05/09/24 0420  AST 21  ALT 12  ALKPHOS 37*  BILITOT 0.8  PROT 6.6  ALBUMIN 3.3*   Recent Labs  Lab 05/09/24 0420  LIPASE 41   No results for input(s): AMMONIA in the last 168 hours. Coagulation Profile: Recent Labs  Lab 05/09/24 0420  INR 1.4*   Cardiac Enzymes: Recent Labs  Lab 05/09/24 0420 05/09/24 0618  TROPONINIHS 14 121*   BNP (last 3 results) Recent Labs    01/28/24 0914 05/09/24 0420  BNP 251.9* 337.3*   HbA1C: No results for input(s): HGBA1C in the last 72 hours. CBG: Recent Labs  Lab 05/09/24 0849  GLUCAP  153*   Lipid Profile: No results for input(s): CHOL, HDL, LDLCALC, TRIG, CHOLHDL, LDLDIRECT in the last 72 hours. Thyroid  Function Tests: No results for input(s): TSH, T4TOTAL, FREET4, T3FREE, THYROIDAB in the last 72 hours. Anemia Panel: No results for input(s): VITAMINB12, FOLATE, FERRITIN, TIBC, IRON, RETICCTPCT in the last 72 hours. Urine analysis:    Component Value Date/Time   COLORURINE STRAW (A) 05/09/2024 0543   APPEARANCEUR CLEAR (A) 05/09/2024 0543   LABSPEC 1.006 05/09/2024 0543   PHURINE 7.0 05/09/2024 0543   GLUCOSEU NEGATIVE 05/09/2024 0543   HGBUR NEGATIVE 05/09/2024 0543   BILIRUBINUR NEGATIVE 05/09/2024 0543   KETONESUR NEGATIVE 05/09/2024 0543   PROTEINUR NEGATIVE 05/09/2024 0543   NITRITE NEGATIVE 05/09/2024 0543   LEUKOCYTESUR NEGATIVE 05/09/2024 0543    Radiological Exams on Admission: I have personally reviewed images DG Chest Portable 1 View Result Date: 05/09/2024 EXAM: 1 VIEW XRAY OF THE CHEST 05/09/2024 04:31:00 AM COMPARISON: 1 view chest x-ray 02/06/2024. CLINICAL HISTORY: Respiratory distress, hypoxia. Respiratory distress, best images possible due to patient condition. FINDINGS: LUNGS AND PLEURA: Moderate pulmonary vascular congestion is present. Asymmetric left pleural effusion and airspace opacity is present, improved from the prior exam. HEART AND MEDIASTINUM: Cardiomegaly is stable. BONES AND SOFT TISSUES: No acute osseous abnormality. IMPRESSION: 1. Asymmetric left pleural effusion and airspace opacity, improved from the prior exam. 2. Moderate pulmonary vascular congestion. 3. Stable cardiomegaly. 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    1.  Acute hypoxemic respiratory failure likely due to combination from CHF and COPD - She will be admitted to hospital as inpatient. - She was given 40 mg of Lasix ,  oxygen, Solu-Medrol , nebulization. - She appears to be doing well. - Will continue to monitor her symptoms.  2.  Acute exacerbation of HFrEF - She was given Lasix  in the emergency room 40 mg IV x 1 - She will be placed on 40 mg IV twice daily - Daily weight - Input output charting - Continue oxygen to maintain saturation more than 90%  3.  Atrial fibrillation - Continue amiodarone  and Eliquis  - Monitor in telemetry  4.  Type 2 diabetes - Continue insulin  Lantus  10 units 2 times daily which is her home dose - Continue insulin  sliding scale - Diabetic diet  5.  Hypothyroidism - Continue Synthroid  75 mcg daily  6.  HTN - Continue to monitor blood pressure - Continue Lasix   7.  COPD - Continue DuoNebs - I do not believe we need to give antibiotic at this point  8.  Elevated lactic acid - Likely due to tachycardia and hypoxemia - Continue to monitor - I do not see any evidence of sepsis, blood cultures will be followed, no antibiotics at this point     DVT prophylaxis: Eliquis  Code Status: DNR/DNI(Do NOT Intubate) Family Communication: Daughter with EDP  Disposition Plan: SNF  Consults called: None  Admission status: Inpatient, Telemetry bed   Nena Rebel, MD [facility] 05/09/2024, 10:59 AM

## 2024-05-09 NOTE — ED Notes (Signed)
 POA Iris Hairston [phone]

## 2024-05-09 NOTE — ED Triage Notes (Signed)
 Pt arrives from [facility] via [facility].  C/O d/t respiratory distress. Recent HX of Resp failure and pneumonia.  Per EMS pt 70% RA at facility and 97% on 4LNC then sudden drop to 85% on 4L in route.  Significant Rhonchi noted in upper lobes.Pt alert at this time and confused per baseline.

## 2024-05-09 NOTE — ED Notes (Signed)
 Notified EDP Mian of pt critical lactic of 2.1 called by lab.

## 2024-05-09 NOTE — ED Provider Notes (Signed)
 [facility] Provider Note    Event Date/Time   First MD Initiated Contact with Patient 05/09/24 [phone]     (approximate)   History   Respiratory Distress  Pt arrives from [facility] via [facility].  C/O d/t respiratory distress. Recent HX of Resp failure and pneumonia.  Per EMS pt 70% RA at facility and 97% on 4LNC then sudden drop to 85% on 4L in route.  Significant Rhonchi noted in upper lobes.Pt alert at this time and confused per baseline.   HPI Tanya Frye is a 83 y.o. female CHF, COPD, diabetes, hypertension, A-fib on Eliquis  presents for valuation of shortness of breath - Per EMS, facility called due to worsening respiratory distress.  Was reportedly satting 70% on room air at facility, improved to 90s with 4 L nasal cannula.  Did have some desaturations to about 85% and route though improved spontaneously. - Patient is a noncontributory historian  Daughter related are present at bedside.  Confirms DNR/DNI status.  Notes patient is usually conversive and had recently been in her usual state of health.   Per chart review, last admitted in May of this year after presenting for shortness of breath.  Found to have new CHF and exacerbation and also possible pneumonia.  Also experienced shock that was thought to be cardiogenic and septic.  CODE STATUS DNR.     Physical Exam   Triage Vital Signs: BP 111/72   Pulse (!) 122   Temp 97.7 F (36.5 C) (Rectal)   Resp 13   Ht 5' 4 (1.626 m)   Wt 79.6 kg   SpO2 100%   BMI 30.12 kg/m     Most recent vital signs: Vitals:   05/09/24 0450 05/09/24 0530  BP:  111/72  Pulse:  (!) 122  Resp:  13  Temp: 97.7 F (36.5 C)   SpO2:  100%     General: Very sleepy, arousable to noxious stimuli CV:  Good peripheral perfusion. RRR, RP 2+, 2+ pitting edema BLE Resp:  Tachypneic, diminished breath sounds throughout, end expiratory wheezing bilateral upper lung fields, coarse breath sounds/crackles bilateral  lower lung fields Abd:  No distention. Nontender to deep palpation throughout  Bedside ultrasound with multiple B-lines in bilateral lung fields, dilated IVC.  ED Results / Procedures / Treatments   Labs (all labs ordered are listed, but only abnormal results are displayed) Labs Reviewed  CBC WITH DIFFERENTIAL/PLATELET - Abnormal; Notable for the following components:      Result Value   WBC 13.2 (*)    RDW 16.5 (*)    Lymphs Abs 4.1 (*)    Basophils Absolute 0.2 (*)    All other components within normal limits  COMPREHENSIVE METABOLIC PANEL WITH GFR - Abnormal; Notable for the following components:   Potassium 3.4 (*)    Glucose, Bld 179 (*)    Creatinine, Ser 1.22 (*)    Albumin 3.3 (*)    Alkaline Phosphatase 37 (*)    GFR, Estimated 44 (*)    All other components within normal limits  PROTIME-INR - Abnormal; Notable for the following components:   Prothrombin Time 17.9 (*)    INR 1.4 (*)    All other components within normal limits  LACTIC ACID, PLASMA - Abnormal; Notable for the following components:   Lactic Acid, Venous 2.1 (*)    All other components within normal limits  LACTIC ACID, PLASMA - Abnormal; Notable for the following components:   Lactic Acid, Venous  2.1 (*)    All other components within normal limits  BRAIN NATRIURETIC PEPTIDE - Abnormal; Notable for the following components:   B Natriuretic Peptide 337.3 (*)    All other components within normal limits  BLOOD GAS, VENOUS - Abnormal; Notable for the following components:   pO2, Ven 54 (*)    All other components within normal limits  URINALYSIS, COMPLETE (UACMP) WITH MICROSCOPIC - Abnormal; Notable for the following components:   Color, Urine STRAW (*)    APPearance CLEAR (*)    All other components within normal limits  TROPONIN I (HIGH SENSITIVITY) - Abnormal; Notable for the following components:   Troponin I (High Sensitivity) 121 (*)    All other components within normal limits  RESP PANEL BY  RT-PCR (RSV, FLU A&B, COVID)  RVPGX2  CULTURE, BLOOD (ROUTINE X 2)  CULTURE, BLOOD (ROUTINE X 2)  LIPASE, BLOOD  TROPONIN I (HIGH SENSITIVITY)     EKG  Ecg =Sinus tach, rate 119, no gross ST elevation or depression, normal axis, prolonged QT.  No clear evidence of ischemia or arrhythmia on my interpretation.   RADIOLOGY Radiology interpreted by myself and radiology report reviewed.  Most consistent with vascular congestion.  Left-sided effusion.    PROCEDURES:  Critical Care performed: Yes, see critical care procedure note(s)  .Critical Care  Performed by: Clarine Ozell LABOR, MD Authorized by: Clarine Ozell LABOR, MD   Critical care provider statement:    Critical care time (minutes):  30   Critical care time was exclusive of:  Separately billable procedures and treating other patients   Critical care was necessary to treat or prevent imminent or life-threatening deterioration of the following conditions:  Respiratory failure   Critical care was time spent personally by me on the following activities:  Development of treatment plan with patient or surrogate, discussions with consultants, evaluation of patient's response to treatment, examination of patient, ordering and review of laboratory studies, ordering and review of radiographic studies, ordering and performing treatments and interventions, pulse oximetry, re-evaluation of patient's condition and review of old charts   I assumed direction of critical care for this patient from another provider in my specialty: no     Care discussed with: admitting provider      MEDICATIONS ORDERED IN ED: Medications  albuterol  (PROVENTIL ) (2.5 MG/3ML) 0.083% nebulizer solution 2.5 mg (has no administration in time range)  ondansetron  (ZOFRAN ) injection 4 mg (has no administration in time range)  HYDROmorphone  (DILAUDID ) injection 0.5 mg (has no administration in time range)  insulin  aspart (novoLOG ) injection 0-15 Units (has no administration  in time range)  insulin  aspart (novoLOG ) injection 0-5 Units (has no administration in time range)  acetaminophen  (TYLENOL ) tablet 650 mg (has no administration in time range)    Or  acetaminophen  (TYLENOL ) suppository 650 mg (has no administration in time range)  ipratropium-albuterol  (DUONEB) 0.5-2.5 (3) MG/3ML nebulizer solution 3 mL (3 mLs Nebulization Given 05/09/24 0432)  ipratropium-albuterol  (DUONEB) 0.5-2.5 (3) MG/3ML nebulizer solution 3 mL (3 mLs Nebulization Given 05/09/24 0432)  furosemide  (LASIX ) injection 40 mg (40 mg Intravenous Given 05/09/24 0452)  methylPREDNISolone  sodium succinate  (SOLU-MEDROL ) 125 mg/2 mL injection 125 mg (125 mg Intravenous Given 05/09/24 0539)     IMPRESSION / MDM / ASSESSMENT AND PLAN / ED COURSE  I reviewed the triage vital signs and the nursing notes.  DDX/MDM/AP: Differential diagnosis includes, but is not limited to, CHF exacerbation, COPD exacerbation, consider underlying pneumonia, doubt pulmonary embolism in this anticoagulated patient, consider underlying hypercarbia or hypoxia contributing to initial altered mental status.  Plan: - DuoNebs - Lasix  - EKG - Cardiac monitor - Chest x-ray - Labs - Trial of BiPAP - IV steroids - Anticipate admission  Patient's presentation is most consistent with acute presentation with potential threat to life or bodily function.  The patient is on the cardiac monitor to evaluate for evidence of arrhythmia and/or significant heart rate changes.  ED course below.  Patient's breathing and mental status markedly improved with above treatment, did not tolerate BiPAP is doing well and serial nebulizer treatments with Lasix  and steroids.  Weaned down to about 2 L nasal cannula.  Does have a persistent though downtrending leukocytosis, is afebrile here, chest x-ray reportedly improved from prior with regard to airspace opacity-antibiotics held at this time, defer to inpatient team  whether may be appropriate to initiate.  Mild lactic acidosis that I suspect is secondary to increased work of breathing and serial nebulizer treatments.  Admitted to hospitalist service.  Clinical Course as of 05/09/24 0736  Sun May 09, 2024  0426 Patient did not tolerate BiPAP, will continue with nasal cannula and DuoNeb [MM]  0445 VBG overall unremarkable [MM]  0446 Chest x-ray concerning for vascular congestion on my read, formal read pending [MM]  0446 CBC with mild leukocytosis though lower than prior [MM]  0453 CXR: IMPRESSION: 1. Asymmetric left pleural effusion and airspace opacity, improved from the prior exam. 2. Moderate pulmonary vascular congestion. 3. Stable cardiomegaly.   [MM]  0521 Trop wnl [MM]  0522 Bnp elevated [MM]  0522 Viral swab neg [MM]  0525 Hospitalist consult order placed [MM]    Clinical Course User Index [MM] Clarine Ozell LABOR, MD     FINAL CLINICAL IMPRESSION(S) / ED DIAGNOSES   Final diagnoses:  Acute respiratory failure with hypoxia (HCC)  Acute on chronic congestive heart failure, unspecified heart failure type (HCC)  COPD exacerbation (HCC)     Rx / DC Orders   ED Discharge Orders     None        Note:  This document was prepared using Dragon voice recognition software and may include unintentional dictation errors.   Clarine Ozell LABOR, MD 05/09/24 [phone]

## 2024-05-09 NOTE — ED Notes (Signed)
 Notified MD Cleatus that pt repeat lactic is 2.1 per lab.

## 2024-05-10 ENCOUNTER — Encounter: Payer: Self-pay | Admitting: Internal Medicine

## 2024-05-10 DIAGNOSIS — I429 Cardiomyopathy, unspecified: Secondary | ICD-10-CM | POA: Diagnosis not present

## 2024-05-10 DIAGNOSIS — Z825 Family history of asthma and other chronic lower respiratory diseases: Secondary | ICD-10-CM | POA: Diagnosis not present

## 2024-05-10 DIAGNOSIS — Z880 Allergy status to penicillin: Secondary | ICD-10-CM | POA: Diagnosis not present

## 2024-05-10 DIAGNOSIS — I420 Dilated cardiomyopathy: Secondary | ICD-10-CM | POA: Diagnosis present

## 2024-05-10 DIAGNOSIS — Z79890 Hormone replacement therapy (postmenopausal): Secondary | ICD-10-CM | POA: Diagnosis not present

## 2024-05-10 DIAGNOSIS — E872 Acidosis, unspecified: Secondary | ICD-10-CM | POA: Diagnosis present

## 2024-05-10 DIAGNOSIS — I4892 Unspecified atrial flutter: Secondary | ICD-10-CM | POA: Diagnosis not present

## 2024-05-10 DIAGNOSIS — I4819 Other persistent atrial fibrillation: Secondary | ICD-10-CM | POA: Diagnosis present

## 2024-05-10 DIAGNOSIS — Z79899 Other long term (current) drug therapy: Secondary | ICD-10-CM | POA: Diagnosis not present

## 2024-05-10 DIAGNOSIS — J441 Chronic obstructive pulmonary disease with (acute) exacerbation: Secondary | ICD-10-CM | POA: Diagnosis present

## 2024-05-10 DIAGNOSIS — I5043 Acute on chronic combined systolic (congestive) and diastolic (congestive) heart failure: Secondary | ICD-10-CM | POA: Diagnosis not present

## 2024-05-10 DIAGNOSIS — I5020 Unspecified systolic (congestive) heart failure: Secondary | ICD-10-CM | POA: Diagnosis not present

## 2024-05-10 DIAGNOSIS — I4891 Unspecified atrial fibrillation: Secondary | ICD-10-CM | POA: Diagnosis not present

## 2024-05-10 DIAGNOSIS — Z794 Long term (current) use of insulin: Secondary | ICD-10-CM | POA: Diagnosis not present

## 2024-05-10 DIAGNOSIS — Z66 Do not resuscitate: Secondary | ICD-10-CM | POA: Diagnosis present

## 2024-05-10 DIAGNOSIS — I2489 Other forms of acute ischemic heart disease: Secondary | ICD-10-CM | POA: Diagnosis present

## 2024-05-10 DIAGNOSIS — E11649 Type 2 diabetes mellitus with hypoglycemia without coma: Secondary | ICD-10-CM | POA: Diagnosis not present

## 2024-05-10 DIAGNOSIS — Z841 Family history of disorders of kidney and ureter: Secondary | ICD-10-CM | POA: Diagnosis not present

## 2024-05-10 DIAGNOSIS — E039 Hypothyroidism, unspecified: Secondary | ICD-10-CM | POA: Diagnosis present

## 2024-05-10 DIAGNOSIS — I5022 Chronic systolic (congestive) heart failure: Secondary | ICD-10-CM

## 2024-05-10 DIAGNOSIS — I130 Hypertensive heart and chronic kidney disease with heart failure and stage 1 through stage 4 chronic kidney disease, or unspecified chronic kidney disease: Secondary | ICD-10-CM | POA: Diagnosis present

## 2024-05-10 DIAGNOSIS — I484 Atypical atrial flutter: Secondary | ICD-10-CM | POA: Diagnosis not present

## 2024-05-10 DIAGNOSIS — J9601 Acute respiratory failure with hypoxia: Secondary | ICD-10-CM | POA: Diagnosis not present

## 2024-05-10 DIAGNOSIS — I483 Typical atrial flutter: Secondary | ICD-10-CM | POA: Diagnosis present

## 2024-05-10 DIAGNOSIS — Z7901 Long term (current) use of anticoagulants: Secondary | ICD-10-CM | POA: Diagnosis not present

## 2024-05-10 DIAGNOSIS — E669 Obesity, unspecified: Secondary | ICD-10-CM | POA: Diagnosis present

## 2024-05-10 DIAGNOSIS — E1122 Type 2 diabetes mellitus with diabetic chronic kidney disease: Secondary | ICD-10-CM | POA: Diagnosis present

## 2024-05-10 DIAGNOSIS — I5023 Acute on chronic systolic (congestive) heart failure: Secondary | ICD-10-CM | POA: Diagnosis present

## 2024-05-10 DIAGNOSIS — N1832 Chronic kidney disease, stage 3b: Secondary | ICD-10-CM | POA: Diagnosis present

## 2024-05-10 DIAGNOSIS — R0602 Shortness of breath: Secondary | ICD-10-CM | POA: Diagnosis present

## 2024-05-10 DIAGNOSIS — E876 Hypokalemia: Secondary | ICD-10-CM | POA: Diagnosis present

## 2024-05-10 DIAGNOSIS — R000 Tachycardia, unspecified: Secondary | ICD-10-CM | POA: Diagnosis not present

## 2024-05-10 DIAGNOSIS — J9621 Acute and chronic respiratory failure with hypoxia: Secondary | ICD-10-CM | POA: Diagnosis present

## 2024-05-10 DIAGNOSIS — I5021 Acute systolic (congestive) heart failure: Secondary | ICD-10-CM | POA: Diagnosis not present

## 2024-05-10 DIAGNOSIS — L89151 Pressure ulcer of sacral region, stage 1: Secondary | ICD-10-CM | POA: Diagnosis present

## 2024-05-10 LAB — GLUCOSE, CAPILLARY
Glucose-Capillary: 116 mg/dL — ABNORMAL HIGH (ref 70–99)
Glucose-Capillary: 158 mg/dL — ABNORMAL HIGH (ref 70–99)

## 2024-05-10 LAB — MAGNESIUM: Magnesium: 1.7 mg/dL (ref 1.7–2.4)

## 2024-05-10 LAB — BASIC METABOLIC PANEL WITH GFR
Anion gap: 13 (ref 5–15)
BUN: 15 mg/dL (ref 8–23)
CO2: 22 mmol/L (ref 22–32)
Calcium: 8.1 mg/dL — ABNORMAL LOW (ref 8.9–10.3)
Chloride: 101 mmol/L (ref 98–111)
Creatinine, Ser: 1.26 mg/dL — ABNORMAL HIGH (ref 0.44–1.00)
GFR, Estimated: 43 mL/min — ABNORMAL LOW (ref >60)
Glucose, Bld: 177 mg/dL — ABNORMAL HIGH (ref 70–99)
Potassium: 4 mmol/L (ref 3.5–5.1)
Sodium: 136 mmol/L (ref 135–145)

## 2024-05-10 LAB — CBC
HCT: 35.2 % — ABNORMAL LOW (ref 36.0–46.0)
Hemoglobin: 11.5 g/dL — ABNORMAL LOW (ref 12.0–15.0)
MCH: 29.2 pg (ref 26.0–34.0)
MCHC: 32.7 g/dL (ref 30.0–36.0)
MCV: 89.3 fL (ref 80.0–100.0)
Platelets: 277 K/uL (ref 150–400)
RBC: 3.94 MIL/uL (ref 3.87–5.11)
RDW: 16.5 % — ABNORMAL HIGH (ref 11.5–15.5)
WBC: 9.6 K/uL (ref 4.0–10.5)
nRBC: 0 % (ref 0.0–0.2)

## 2024-05-10 LAB — CBG MONITORING, ED
Glucose-Capillary: 117 mg/dL — ABNORMAL HIGH (ref 70–99)
Glucose-Capillary: 150 mg/dL — ABNORMAL HIGH (ref 70–99)

## 2024-05-10 LAB — TROPONIN I (HIGH SENSITIVITY): Troponin I (High Sensitivity): 323 ng/L (ref <18)

## 2024-05-10 LAB — LACTIC ACID, PLASMA: Lactic Acid, Venous: 2.4 mmol/L (ref 0.5–1.9)

## 2024-05-10 LAB — TSH: TSH: 1.701 u[IU]/mL (ref 0.350–4.500)

## 2024-05-10 MED ORDER — AMIODARONE HCL 200 MG PO TABS
400.0000 mg | ORAL_TABLET | Freq: Two times a day (BID) | ORAL | Status: DC
  Administered 2024-05-10 – 2024-05-13 (×7): 400 mg via ORAL
  Filled 2024-05-10 (×7): qty 2

## 2024-05-10 MED ORDER — MAGNESIUM OXIDE -MG SUPPLEMENT 400 (240 MG) MG PO TABS
400.0000 mg | ORAL_TABLET | Freq: Every day | ORAL | Status: DC
  Administered 2024-05-10 – 2024-05-14 (×5): 400 mg via ORAL
  Filled 2024-05-10 (×5): qty 1

## 2024-05-10 MED ORDER — METOPROLOL TARTRATE 25 MG PO TABS
12.5000 mg | ORAL_TABLET | Freq: Two times a day (BID) | ORAL | Status: DC
  Administered 2024-05-10: 12.5 mg via ORAL
  Filled 2024-05-10 (×2): qty 1

## 2024-05-10 NOTE — Consult Note (Addendum)
 Cardiology Consultation:   Patient ID: Tanya Frye; [id number]; [birth year]   Admit date: 05/09/2024 Date of Consult: 05/10/2024  Primary Care Provider: Pcp, No Primary Cardiologist: Perla Primary Electrophysiologist:  None   Patient Profile:   Tanya Frye is a 83 y.o. 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 Barbarann.  History of Present Illness:   Ms. Hoston was admitted to the hospital in 01/2024 after presenting with shortness of breath and developed profound cardiogenic shock requiring multiple vasopressors and inotropes.  Etiology was thought to be secondary to Takotsubo cardiomyopathy.  After prolonged ventilatory wean she underwent aggressive IV diuresis and was extubated after discussion with palliative care and hospice.  She slowly improved throughout hospitalization and was discharged to rehab.  She has followed up with the advanced heart failure service in the outpatient setting.  She has remained in A-fib with ventricular rates in the low 100s bpm with no plans for rhythm control at this time.  She has been continued on amiodarone  for rate control as well as apixaban .  Regarding her cardiomyopathy, she was compensated and continued on low-dose losartan  12.5 mg and spironolactone  25 mg.  Beta-blocker was held given recent inotrope dependence.   She was admitted to [facility] on 05/09/2024 with increased shortness of breath with oxygen saturations of 70% on room air at SNF.  She was subsequently admitted for acute hypoxic respiratory failure, treated with nebulizer, Solu-Medrol , supplemental oxygen, and IV Lasix  40 mg x 1 with improvement in symptoms, now weaned to room air.  Throughout admission, she has remained tachycardic  with a rate around 120 bpm consistently.  Review of EKG and telemetry is consistent with atrial tachycardia.   Notable labs: Initial high-sensitivity troponin 14 with a delta troponin 121 currently trended to 323, BNP 337, potassium 3.4 trending to 4.0, serum creatinine 1.22, magnesium  1.7   Imaging: Chest x-ray with asymmetric left pleural effusion and airspace opacity improved from prior study with moderate pulmonary vascular congestion cardiomegaly   At time of cardiology consult, patient remains tachycardic with a rate that has been consistently around 120 bpm.  She is without symptoms of palpitations.  She reports her dyspnea is improved and back to baseline.  No symptoms of angina.  Lower extremity swelling is stable and at her baseline.  No dizziness, presyncope, or syncope.    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   CHF (congestive heart failure) (HCC)    Diabetes mellitus without complication (HCC)    HOH (hard of hearing)    extremely HOH   Hypertension    Palpitations    occasional related to meds/heart races    Past Surgical History:  Procedure Laterality Date   ABDOMINAL HYSTERECTOMY  1990   CATARACT EXTRACTION W/PHACO Right 12/20/2020   Procedure: CATARACT EXTRACTION PHACO AND INTRAOCULAR LENS PLACEMENT (IOC) RIGHT DIABETIC 8.13 01:17.6 10.5%;  Surgeon: Mittie Gaskin, MD;  Location: [facility];  Service: Ophthalmology;  Laterality: Right;   CATARACT EXTRACTION W/PHACO Left 01/03/2021   Procedure: CATARACT EXTRACTION PHACO AND INTRAOCULAR LENS PLACEMENT (IOC) LEFT DIABETIC  7.37 01:15.6 9.8%;  Surgeon: Mittie Gaskin, MD;  Location: [facility];  Service: Ophthalmology;  Laterality: Left;   CHOLECYSTECTOMY  DILATION AND CURETTAGE OF UTERUS  before 1990   X2   NECK SURGERY  03/30/2008   Per family, pt broke her neck   STOMACH SURGERY     tumor removed, per family     Home Meds: Prior to  Admission medications   Medication Sig Start Date End Date Taking? Authorizing Provider  acetaminophen  (TYLENOL ) 650 MG CR tablet Take 650 mg by mouth every 8 (eight) hours as needed for pain.   Yes [provider]  amiodarone  (PACERONE ) 200 MG tablet Take 1 tablet (200 mg total) by mouth daily. 03/23/24  Yes Zenaida Morene PARAS, MD  apixaban  (ELIQUIS ) 5 MG TABS tablet Take 1 tablet (5 mg total) by mouth 2 (two) times daily. 02/13/24  Yes Awanda City, MD  ascorbic acid  (VITAMIN C ) 500 MG tablet Take 500 mg by mouth daily.   Yes [provider]  calcium carbonate (OSCAL) 1500 (600 Ca) MG TABS tablet Take 1,500 mg by mouth in the morning and at bedtime.   Yes [provider]  docusate sodium  (COLACE) 100 MG capsule Take 100 mg by mouth 2 (two) times daily.   Yes [provider]  fluticasone  (FLONASE ) 50 MCG/ACT nasal spray Place 1 spray into both nostrils daily.   Yes [provider]  insulin  glargine-yfgn (SEMGLEE ) 100 UNIT/ML injection Inject 0.1 mLs (10 Units total) into the skin 2 (two) times daily. 02/13/24  Yes Awanda City, MD  levothyroxine  (SYNTHROID ) 75 MCG tablet Take 75 mcg by mouth daily before breakfast.   Yes [provider]  loratadine  (CLARITIN ) 10 MG tablet TAKE 1 TABLET BY MOUTH EVERY DAY 09/24/22  Yes Masoud, Javed, MD  lovastatin (MEVACOR) 10 MG tablet TAKE 1 TABLET ONCE A DAY ORALLY 10/20/17  Yes [provider]  Multiple Vitamin (MULTIVITAMIN WITH MINERALS) TABS tablet Take 1 tablet by mouth daily. 02/14/24  Yes Awanda City, MD  nystatin powder Apply 1 Application topically 2 (two) times daily. Apply Nystatin powder to red area under right breast and place ABD pad between breast and chest twice daily   Yes [provider]  Omega-3 Fatty Acids (FISH OIL) 500 MG CAPS Take 1,000 mg by mouth daily.   Yes [provider]  ondansetron  (ZOFRAN -ODT) 4 MG disintegrating tablet Take 4 mg by mouth every 8 (eight) hours as  needed for nausea or vomiting.   Yes [provider]  phenol (CHLORASEPTIC) 1.4 % LIQD Use as directed 1 spray in the mouth or throat every 2 (two) hours as needed for throat irritation / pain.   Yes [provider]  polyethylene glycol (MIRALAX  / GLYCOLAX ) 17 g packet Take 17 g by mouth daily.   Yes [provider]  traMADol (ULTRAM) 50 MG tablet Take 50 mg by mouth every 8 (eight) hours as needed (unspecified pain).   Yes [provider]  trolamine salicylate (ASPERCREME) 10 % cream Apply 1 Application topically 3 (three) times daily. Apply to right knee three times a day.   Yes [provider]  feeding supplement (ENSURE ENLIVE / ENSURE PLUS) LIQD Take 237 mLs by mouth 2 (two) times daily between meals. 02/13/24   Awanda City, MD    Inpatient Medications: Scheduled Meds:  amiodarone   200 mg Oral Daily   apixaban   5 mg Oral BID   insulin  aspart  0-15 Units Subcutaneous TID WC   insulin  aspart  0-5 Units Subcutaneous QHS   insulin  glargine-yfgn  10 Units Subcutaneous BID   levothyroxine   75  mcg Oral QAC breakfast   Continuous Infusions:  PRN Meds: acetaminophen  **OR** acetaminophen   Allergies:   Allergies  Allergen Reactions   Penicillins Rash    Social History:   Social History   Socioeconomic History   Marital status: Widowed    Spouse name: Not on file   Number of children: 1   Years of education: Not on file   Highest education level: 10th grade  Occupational History   Not on file  Tobacco Use   Smoking status: Never   Smokeless tobacco: Never  Vaping Use   Vaping status: Not on file  Substance and Sexual Activity   Alcohol use: Never   Drug use: Never   Sexual activity: Not Currently  Other Topics Concern   Not on file  Social History Narrative   Not on file   Social Drivers of Health   Financial Resource Strain: Low Risk  (09/05/2021)   Overall Financial Resource Strain (CARDIA)    Difficulty of Paying Living  Expenses: Not hard at all  Food Insecurity: Patient Unable To Answer (02/04/2024)   Hunger Vital Sign    Worried About Running Out of Food in the Last Year: Patient unable to answer    Ran Out of Food in the Last Year: Patient unable to answer  Transportation Needs: Patient Unable To Answer (01/29/2024)   PRAPARE - Transportation    Lack of Transportation (Medical): Patient unable to answer    Lack of Transportation (Non-Medical): Patient unable to answer  Physical Activity: Inactive (09/05/2021)   Exercise Vital Sign    Days of Exercise per Week: 0 days    Minutes of Exercise per Session: 0 min  Stress: No Stress Concern Present (09/05/2021)   [employer] of Occupational Health - Occupational Stress Questionnaire    Feeling of Stress : Not at all  Social Connections: Patient Unable To Answer (01/29/2024)   Social Connection and Isolation Panel    Frequency of Communication with Friends and Family: Patient unable to answer    Frequency of Social Gatherings with Friends and Family: Patient unable to answer    Attends Religious Services: Patient unable to answer    Active Member of Clubs or Organizations: Patient unable to answer    Attends Banker Meetings: Patient unable to answer    Marital Status: Patient unable to answer  Intimate Partner Violence: Patient Unable To Answer (01/29/2024)   Humiliation, Afraid, Rape, and Kick questionnaire    Fear of Current or Ex-Partner: Patient unable to answer    Emotionally Abused: Patient unable to answer    Physically Abused: Patient unable to answer    Sexually Abused: Patient unable to answer     Family History:   Family History  Problem Relation Age of Onset   Asthma Mother    Kidney disease Father     ROS:  Review of Systems  Constitutional:  Positive for malaise/fatigue. Negative for chills, diaphoresis, fever and weight loss.  HENT:  Negative for congestion.   Eyes:  Negative for discharge and redness.   Respiratory:  Positive for shortness of breath. Negative for cough, sputum production and wheezing.   Cardiovascular:  Negative for chest pain, palpitations, orthopnea, claudication, leg swelling and PND.  Gastrointestinal:  Negative for abdominal pain, heartburn, nausea and vomiting.  Musculoskeletal:  Negative for falls and myalgias.  Skin:  Negative for rash.  Neurological:  Positive for weakness. Negative for dizziness, tingling, tremors, sensory change, speech change, focal weakness and loss of  consciousness.  Endo/Heme/Allergies:  Does not bruise/bleed easily.  Psychiatric/Behavioral:  Negative for substance abuse. The patient is not nervous/anxious.   All other systems reviewed and are negative.     Physical Exam/Data:   Vitals:   05/10/24 1037 05/10/24 1130 05/10/24 1230 05/10/24 1309  BP: 113/80 96/68 104/74   Pulse: (!) 123 (!) 120 (!) 121   Resp: 20     Temp:    98.4 F (36.9 C)  TempSrc:    Oral  SpO2: 97% 97% 96%   Weight:      Height:       No intake or output data in the 24 hours ending 05/10/24 1324 Filed Weights   05/09/24 0443  Weight: 79.6 kg   Body mass index is 30.12 kg/m.   Physical Exam: General: Well developed, well nourished, in no acute distress. Head: Normocephalic, atraumatic, sclera non-icteric, no xanthomas, nares without discharge.  HOH.  Neck: Negative for carotid bruits. JVD not elevated. Lungs: Clear bilaterally to auscultation without wheezes, rales, or rhonchi. Breathing is unlabored. Heart: Tachycardic with S1 S2. No murmurs, rubs, or gallops appreciated. Abdomen: Soft, non-tender, non-distended with normoactive bowel sounds. No hepatomegaly. No rebound/guarding. No obvious abdominal masses. Msk:  Strength and tone appear normal for age. Extremities: No clubbing or cyanosis.  Mild bilateral chronic woody appearing lower extremity edema.  Neuro: Alert and oriented X 3. No facial asymmetry. No focal deficit. Moves all extremities  spontaneously. Psych:  Responds to questions appropriately with a normal affect.   EKG:  The EKG was personally reviewed and demonstrates: Atrial tachycardia, 119 bpm, nonspecific IVCD, nonspecific ST-T changes Telemetry:  Telemetry was personally reviewed and demonstrates: Atrial tachycardia with rate around 120 bpm  Weights: Filed Weights   05/09/24 0443  Weight: 79.6 kg    Relevant CV Studies:  2D echo 01/29/2024: 1. Left ventricular ejection fraction, by estimation, is 25 to 30%. The  left ventricle has severely decreased function. The left ventricle  demonstrates global hypokinesis with severe hypokinesis of teh basal to  mid anterior/anteroseptal wall (apical  region not well visualized). The left ventricular internal cavity size was  mildly dilated. Left ventricular diastolic parameters are indeterminate.   2. Right ventricular systolic function is normal. The right ventricular  size is normal. There is normal pulmonary artery systolic pressure. The  estimated right ventricular systolic pressure is 34.4 mmHg.   3. The mitral valve is normal in structure. Mild to moderate mitral valve  regurgitation. No evidence of mitral stenosis. Moderate mitral annular  calcification.   4. The aortic valve is normal in structure. There is moderate  calcification of the aortic valve. Aortic valve regurgitation is not  visualized. Aortic valve sclerosis/calcification is present, unable to  exclude stenosis (gradient not measured.   5. The inferior vena cava is normal in size with greater than 50%  respiratory variability, suggesting right atrial pressure of 3 mmHg.   Laboratory Data:  Chemistry Recent Labs  Lab 05/09/24 0420 05/09/24 0618 05/10/24 1019  NA 140 141 136  K 3.4* 3.4* 4.0  CL 105 103 101  CO2 24 23 22   GLUCOSE 179* 164* 177*  BUN 11 12 15   CREATININE 1.22* 1.42* 1.26*  CALCIUM 9.2 9.2 8.1*  GFRNONAA 44* 37* 43*  ANIONGAP 11 15 13     Recent Labs  Lab  05/09/24 0420 05/09/24 0618  PROT 6.6 6.2*  ALBUMIN 3.3* 3.0*  AST 21 24  ALT 12 12  ALKPHOS 37* 34*  BILITOT 0.8  0.8   Hematology Recent Labs  Lab 05/09/24 0420 05/10/24 0412  WBC 13.2* 9.6  RBC 4.81 3.94  HGB 14.0 11.5*  HCT 44.0 35.2*  MCV 91.5 89.3  MCH 29.1 29.2  MCHC 31.8 32.7  RDW 16.5* 16.5*  PLT 384 277   Cardiac EnzymesNo results for input(s): TROPONINI in the last 168 hours. No results for input(s): TROPIPOC in the last 168 hours.  BNP Recent Labs  Lab 05/09/24 0420  BNP 337.3*    DDimer No results for input(s): DDIMER in the last 168 hours.  Radiology/Studies:  DG Chest Portable 1 View Result Date: 05/09/2024 IMPRESSION: 1. Asymmetric left pleural effusion and airspace opacity, improved from the prior exam. 2. Moderate pulmonary vascular congestion. 3. Stable cardiomegaly. Electronically signed by: Lonni Necessary MD 05/09/2024 04:44 AM EDT RP Workstation: [id number]    Assessment and Plan:   1. Atrial tachycardia:  - Present since admission with rate around 120 bpm, onset unclear  - Likely contributing to presentation  - Relative hypotension precludes addition of beta-blocker  - Avoid nondihydropyridine calcium channel blocker with cardiomyopathy and in the context of relative hypotension  - Avoid digoxin  with advanced age  - Discussed pursuing DCCV versus pharmacotherapy with the patient's daughter at bedside who prefers more conservative approach at this time with reloading of amiodarone  and reserving potential DCCV for refractory tachycardia despite escalation of pharmacotherapy - Should DCCV be pursued, we will need to definitively confirm that she has not missed any doses of apixaban  given underlying A-fib and unclear timing of development of atrial flutter - Reload with amiodarone  400 mg bid - Avoid amiodarone  drip at this time due to relative hypotension - Check TSH  - Hypokalemia repleted  - Replete magnesium  to goal 2.0   2.  Persistent A-fib:  - Currently in atrial tachycardia with rate of 120 bpm  - It is unclear when she went from A-fib to atrial tachycardia, therefore we would need to confirm that she has not missed any doses of apixaban  prior to considering pursuing DCCV for atrial tachycardia if needed - Amiodarone  as above  - Continue apixaban  5 mg twice daily, does not meet reduced dosing criteria   3. HFrEF:  - Presumed to be secondary to Takotsubo cardiomyopathy  - Cardiac cath deferred previously given critical illness, age, and palliative care discussion  - Relative hypotension has led to the holding of GDMT, resume/escalate as able  - Consider repeating echo when she is out of atrial tachycardia and in sinus rhythm  - Diurese as indicated   4. Acute hypoxic respiratory failure:  - Resolved, now on room air  - Likely in the setting of atrial tachycardia in the context of significant cardiomyopathy with EF of 25 to 30%  - High risk for recurrence while in atrial tachycardia   5. Elevated high-sensitivity troponin:  - Mildly elevated, likely reflective of supply/demand ischemia in the setting of atrial tachycardia and renal dysfunction  - Cardiac cath previously deferred as outlined above; no plans for inpatient ischemic evaluation at this time - On apixaban  in place of aspirin      For questions or updates, please contact [facility] Please consult [email or web address] for contact info under Cardiology/STEMI.   Signed, Bernardino Bring, PA-C [facility] Pager: [phone] 05/10/2024, 1:24 PM

## 2024-05-10 NOTE — ED Notes (Signed)
 Dr Barbarann notified of pt HR of 124

## 2024-05-10 NOTE — ED Notes (Signed)
 Assumed care of patient, pt resting with eyes shut, respirations even and unlabored, no distress noted

## 2024-05-10 NOTE — Progress Notes (Signed)
 Heart Failure Navigator Progress Note  Assessed for Heart & Vascular TOC clinic readiness.  Patient does not meet criteria due to current Advanced Heart Failure Team patient of Tanya Brownie, MD.   Navigator will sign off at this time.   Charmaine Pines, RN, BSN [facility] Heart Failure Navigator Secure Chat Only

## 2024-05-10 NOTE — Care Management Obs Status (Signed)
 MEDICARE OBSERVATION STATUS NOTIFICATION   Patient Details  Name: Tanya Frye MRN: [id number] Date of Birth: 06-Apr-1941   Medicare Observation Status Notification Given:  No (patient was in a deep sleep, could not wake her.  Spoke with [name], relative by telephone)    [name] 05/10/2024, 1:36 PM

## 2024-05-10 NOTE — Progress Notes (Signed)
 Progress Note   Patient: Tanya Frye FMW:[id number] DOB: 1940-11-25 DOA: 05/09/2024     0 DOS: the patient was seen and examined on 05/10/2024   Brief hospital course: 83yo with h/o COPD, chronic HFrEF, HTN, DM, and afib on Eliquis  who presented on 8/17 with SOB. She was previously admitted in 01/2024 with CAP and cardiogenic shock requiring mechanical ventilation, pressors, and milrinone  drip. O2 sats at SNF were 70% on room air. She is DNR/DNI. HR 119 (sinus tach), CXR with vascular congestion and left-sided pleural effusion. She was given Duonebs, Lasix , and Solumedrol with concern for CHF/COPD exacerbation.  O2 sats here not <93%, stable on RA.   Assessment and Plan:  Acute hypoxemic respiratory failure Reported to have O2 sat of 70% at her facility with SOB 100% on 2L [place] O2 on arrival  She was given 40 mg of Lasix , oxygen, Solu-Medrol , nebulization CXR on presentation with asymmetric L effusion, improved from prior and moderate vascular congestion Weaned to room air without apparent difficulty Currently appears comfortable without complaint other than wanting to eat Observed overnight   Acute on chronic HFrEF She was given Lasix  in the emergency room 40 mg IV x 1 with resolution of symptoms Appears to be back to baseline   Atrial fibrillation Continue amiodarone  and Eliquis  Persistent mild tachycardia with troponin elevation Will consult cardiology  Stage 3b CKD Appears to be stable at this time Attempt to avoid nephrotoxic medications Recheck BMP in AM    Type 2 diabetes Continue home Lantus  10 units 2 times daily    Hypothyroidism Continue Synthroid    HTN Continue Lasix    COPD Continue DuoNebs No apparent exacerbation at this time   Elevated lactic acid Likely due to tachycardia and hypoxemia No concern for sepsis   Elevated troponin Likely due to tachycardia and hypoxemia Repeat troponin shows ongoing elevation Likely demand ischemia in the setting of  afib Will consult cardiology       Consultants: None   Procedures: None   Antibiotics: None    Subjective: Very concerned about eating breakfast, no other complaints.  Pleasant, mildly confused (vs. Hearing impairment), appears comfortable.   Physical Exam: Vitals:   05/10/24 0854 05/10/24 1037 05/10/24 1130 05/10/24 1230  BP:  113/80 96/68 104/74  Pulse:  (!) 123 (!) 120 (!) 121  Resp:  20    Temp: 97.9 F (36.6 C)     TempSrc: Oral     SpO2:  97% 97% 96%  Weight:      Height:        No intake or output data in the 24 hours ending 05/10/24 1302  Filed Weights   05/09/24 0443  Weight: 79.6 kg    Exam:  General:  Appears calm and comfortable and is in NAD, on RA Eyes:  normal lids, iris ENT:  grossly normal hearing, lips & tongue, mmm Cardiovascular:  RRR. 2+ chronic LE edema.  Respiratory:   CTA bilaterally with no wheezes/rales/rhonchi.  Normal respiratory effort. Abdomen:  soft, NT, ND Skin:  mild BLE stasis dermatitis Musculoskeletal:  grossly normal tone BUE/BLE, good ROM, no bony abnormality Psychiatric:  pleasant mood and affect, speech fluent and appropriate, AOx1 Neurologic:  CN 2-12 grossly intact, moves all extremities in coordinated fashion  Data Reviewed: I have reviewed the patient's lab results since admission.  Pertinent labs for today include:   Glucose 177 BUN 15/Creatinine 1.26/GFR 43, stable HS troponin 14, 121, 323 Lactate 2.1, 2.1, 2.4 WBC 9.6 Hgb 11.5 Blood cultures  NTD     Family Communication: None present; I attempted to call her listed NO[facility] but the number listed appears to be incorrect  Disposition: Status is: Observation The patient remains OBS appropriate and will d/c before 2 midnights.  Planned Discharge Destination: Skilled nursing facility    Time spent: 50 minutes  Author: Delon Herald, MD 05/10/2024 1:02 PM  For on call review [email or web address].

## 2024-05-11 ENCOUNTER — Ambulatory Visit: Payer: Self-pay | Admitting: Internal Medicine

## 2024-05-11 DIAGNOSIS — R000 Tachycardia, unspecified: Secondary | ICD-10-CM | POA: Diagnosis not present

## 2024-05-11 DIAGNOSIS — I484 Atypical atrial flutter: Secondary | ICD-10-CM

## 2024-05-11 DIAGNOSIS — J9601 Acute respiratory failure with hypoxia: Secondary | ICD-10-CM | POA: Diagnosis not present

## 2024-05-11 LAB — GLUCOSE, CAPILLARY
Glucose-Capillary: 117 mg/dL — ABNORMAL HIGH (ref 70–99)
Glucose-Capillary: 158 mg/dL — ABNORMAL HIGH (ref 70–99)
Glucose-Capillary: 119 mg/dL — ABNORMAL HIGH (ref 70–99)
Glucose-Capillary: 124 mg/dL — ABNORMAL HIGH (ref 70–99)

## 2024-05-11 MED ORDER — POLYETHYLENE GLYCOL 3350 17 G PO PACK
17.0000 g | PACK | Freq: Every day | ORAL | Status: DC
  Administered 2024-05-11 – 2024-05-12 (×2): 17 g via ORAL
  Filled 2024-05-11 (×3): qty 1

## 2024-05-11 MED ORDER — METOPROLOL TARTRATE 25 MG PO TABS
25.0000 mg | ORAL_TABLET | Freq: Two times a day (BID) | ORAL | Status: DC
  Administered 2024-05-11 – 2024-05-13 (×6): 25 mg via ORAL
  Filled 2024-05-11 (×5): qty 1

## 2024-05-11 MED ORDER — ENSURE ENLIVE PO LIQD
237.0000 mL | Freq: Two times a day (BID) | ORAL | Status: DC
  Administered 2024-05-11 – 2024-05-14 (×5): 237 mL via ORAL
  Filled 2024-05-11: qty 237

## 2024-05-11 MED ORDER — LORATADINE 10 MG PO TABS
10.0000 mg | ORAL_TABLET | Freq: Every day | ORAL | Status: DC
  Administered 2024-05-11 – 2024-05-14 (×4): 10 mg via ORAL
  Filled 2024-05-11 (×4): qty 1

## 2024-05-11 MED ORDER — DOCUSATE SODIUM 100 MG PO CAPS
100.0000 mg | ORAL_CAPSULE | Freq: Two times a day (BID) | ORAL | Status: DC
  Administered 2024-05-11 – 2024-05-14 (×6): 100 mg via ORAL
  Filled 2024-05-11 (×6): qty 1

## 2024-05-11 MED ORDER — FLUTICASONE PROPIONATE 50 MCG/ACT NA SUSP
1.0000 | Freq: Every day | NASAL | Status: DC
  Administered 2024-05-11 – 2024-05-12 (×2): 1 via NASAL
  Filled 2024-05-11: qty 16

## 2024-05-11 MED ORDER — PRAVASTATIN SODIUM 20 MG PO TABS
10.0000 mg | ORAL_TABLET | Freq: Every day | ORAL | Status: DC
  Administered 2024-05-11 – 2024-05-13 (×3): 10 mg via ORAL
  Filled 2024-05-11 (×3): qty 1

## 2024-05-11 NOTE — Progress Notes (Signed)
 Progress Note   Patient: Tanya Frye FMW:[id number] DOB: [birth year] DOA: 05/09/2024     1 DOS: the patient was seen and examined on 05/11/2024   Brief hospital course: 83yo with h/o COPD, chronic HFrEF, HTN, DM, and afib on Eliquis  who presented on 8/17 with SOB. She was previously admitted in 01/2024 with CAP and cardiogenic shock requiring mechanical ventilation, pressors, and milrinone  drip. O2 sats at SNF were 70% on room air. She is DNR/DNI. HR 119 (sinus tach), CXR with vascular congestion and left-sided pleural effusion. She was given Duonebs, Lasix , and Solumedrol with concern for CHF exacerbation. O2 sats here not <93%, stable on RA. Struggling with rate control, which is likely driving the CHF.  Assessment and Plan:  Acute hypoxemic respiratory failure Reported to have O2 sat of 70% at her facility with SOB 100% on 2L [place] O2 on arrival  She was given 40 mg of Lasix , oxygen, Solu-Medrol , nebulization CXR on presentation with asymmetric L effusion, improved from prior and moderate vascular congestion Weaned to room air without apparent difficulty Currently appears comfortable without complaint Admitted to progressive care   Acute on chronic HFrEF She was given Lasix  in the emergency room 40 mg IV x 1 with resolution of symptoms Appears to be back to baseline Primary issue appears to be uncontrolled afib, and until HR is improved she is likely to have recurrent issues   Atrial fibrillation Continue amiodarone  (reload) and metoprolol  Persistent mild tachycardia with troponin elevation Cardiology consulting May need DCCV but family prefers conservative therapy for now Continue Eliquis  (question of complaince)   Stage 3b CKD Appears to be stable at this time Attempt to avoid nephrotoxic medications Recheck BMP in AM    Type 2 diabetes Continue home Lantus  10 units 2 times daily    Hypothyroidism Continue Synthroid    HTN Continue Lasix    COPD Continue DuoNebs No  apparent exacerbation at this time   Elevated lactic acid Likely due to tachycardia and hypoxemia No concern for sepsis   Elevated troponin Likely due to tachycardia and hypoxemia Repeat troponin shows ongoing elevation Likely demand ischemia in the setting of afib Cardiology is not planning an ischemic evaluation at this time     Consultants: None   Procedures: None   Antibiotics: None  30 Day Unplanned Readmission Risk Score    Flowsheet Row ED to Hosp-Admission (Current) from 05/09/2024 in [facility]  30 Day Unplanned Readmission Risk Score (%) 19.75 Filed at 05/11/2024 0801    This score is the patient's risk of an unplanned readmission within 30 days of being discharged (0 -100%). The score is based on dignosis, age, lab data, medications, orders, and past utilization.   Low:  0-14.9   Medium: 15-21.9   High: 22-29.9   Extreme: 30 and above           Subjective: Pleasant, sitting up in bedside chair.   Objective: Vitals:   05/11/24 1126 05/11/24 1459  BP: 111/79 (!) 87/65  Pulse: (!) 116 (!) 110  Resp: 16   Temp: 97.8 F (36.6 C) 98.2 F (36.8 C)  SpO2: 100% 100%    Intake/Output Summary (Last 24 hours) at 05/11/2024 1544 Last data filed at 05/11/2024 1400 Gross per 24 hour  Intake 900 ml  Output 700 ml  Net 200 ml   Filed Weights   05/09/24 0443  Weight: 79.6 kg    Exam:  General:  Appears calm and comfortable and is in NAD, on RA  Eyes:  normal lids, iris ENT:  grossly normal hearing, lips & tongue, mmm Cardiovascular:  RRR. 2+ chronic LE edema.  Respiratory:   CTA bilaterally with no wheezes/rales/rhonchi.  Normal respiratory effort. Abdomen:  soft, NT, ND Skin:  mild BLE stasis dermatitis Musculoskeletal:  grossly normal tone BUE/BLE, good ROM, no bony abnormality Psychiatric:  pleasant mood and affect, speech fluent and appropriate, AOx1 Neurologic:  CN 2-12 grossly intact, moves all extremities in coordinated  fashion  Data Reviewed: I have reviewed the patient's lab results since admission.  Pertinent labs for today include:   None today     Family Communication: None today; cardiology spoke with family  Disposition: Status is: Inpatient Remains inpatient appropriate because: ongoing management     Time spent: 50 minutes  Unresulted Labs (From admission, onward)     Start     Ordered   05/12/24 0500  CBC with Differential/Platelet  Tomorrow morning,   R       Question:  Specimen collection method  Answer:  Lab=Lab collect   05/11/24 1544   05/12/24 0500  Basic metabolic panel with GFR  Tomorrow morning,   R       Question:  Specimen collection method  Answer:  Lab=Lab collect   05/11/24 1544             Author: Delon Herald, MD 05/11/2024 3:44 PM  For on call review [email or web address].

## 2024-05-11 NOTE — Plan of Care (Signed)
  Problem: Coping: Goal: Ability to adjust to condition or change in health will improve Outcome: Progressing   Problem: Fluid Volume: Goal: Ability to maintain a balanced intake and output will improve Outcome: Progressing   Problem: Health Behavior/Discharge Planning: Goal: Ability to manage health-related needs will improve Outcome: Progressing   Problem: Metabolic: Goal: Ability to maintain appropriate glucose levels will improve Outcome: Progressing   Problem: Nutritional: Goal: Maintenance of adequate nutrition will improve Outcome: Progressing   Problem: Skin Integrity: Goal: Risk for impaired skin integrity will decrease Outcome: Progressing   Problem: Tissue Perfusion: Goal: Adequacy of tissue perfusion will improve Outcome: Progressing   Problem: Clinical Measurements: Goal: Will remain free from infection Outcome: Progressing Goal: Diagnostic test results will improve Outcome: Progressing

## 2024-05-11 NOTE — Plan of Care (Signed)

## 2024-05-11 NOTE — Progress Notes (Signed)
 Progress Note  Patient Name: Tanya Frye Date of Encounter: 05/11/2024  Primary Cardiologist: Gollan  Subjective   She remains in atrial tach versus atypical atrial flutter.  No symptoms of chest pain, dyspnea, palpitations, dizziness, presyncope, or syncope.  Inpatient Medications    Scheduled Meds:  amiodarone   400 mg Oral BID   apixaban   5 mg Oral BID   docusate sodium   100 mg Oral BID   feeding supplement  237 mL Oral BID BM   fluticasone   1 spray Each Nare Daily   insulin  aspart  0-15 Units Subcutaneous TID WC   insulin  aspart  0-5 Units Subcutaneous QHS   insulin  glargine-yfgn  10 Units Subcutaneous BID   levothyroxine   75 mcg Oral QAC breakfast   loratadine   10 mg Oral Daily   magnesium  oxide  400 mg Oral Daily   metoprolol  tartrate  12.5 mg Oral BID   polyethylene glycol  17 g Oral Daily   pravastatin   10 mg Oral q1800   Continuous Infusions:  PRN Meds: acetaminophen  **OR** acetaminophen    Vital Signs    Vitals:   05/10/24 1948 05/10/24 2350 05/11/24 0432 05/11/24 0810  BP: 101/74 105/82 97/76 102/79  Pulse:    (!) 116  Resp: 20 18 19    Temp: 98.3 F (36.8 C) 97.9 F (36.6 C) (!) 97.4 F (36.3 C) 97.7 F (36.5 C)  TempSrc: Oral Oral Oral Oral  SpO2: 97% 97% 96% 98%  Weight:      Height:        Intake/Output Summary (Last 24 hours) at 05/11/2024 0919 Last data filed at 05/10/2024 2100 Gross per 24 hour  Intake 180 ml  Output 700 ml  Net -520 ml   Filed Weights   05/09/24 0443  Weight: 79.6 kg    Telemetry    Atrial tachycardia versus atypical atrial flutter, 120s bpm - Personally Reviewed  ECG    No new tracings - Personally Reviewed  Physical Exam   GEN: No acute distress.  HOH. Neck: No JVD. Cardiac: Tachycardic, no murmurs, rubs, or gallops.  Respiratory: Clear to auscultation bilaterally.  GI: Soft, nontender, non-distended.   MS: Trivial bilateral pretibial edema; No deformity. Neuro:  Alert and oriented x 3; Nonfocal.   Psych: Normal affect.  Labs    Chemistry Recent Labs  Lab 05/09/24 0420 05/09/24 0618 05/10/24 1019  NA 140 141 136  K 3.4* 3.4* 4.0  CL 105 103 101  CO2 24 23 22   GLUCOSE 179* 164* 177*  BUN 11 12 15   CREATININE 1.22* 1.42* 1.26*  CALCIUM 9.2 9.2 8.1*  PROT 6.6 6.2*  --   ALBUMIN 3.3* 3.0*  --   AST 21 24  --   ALT 12 12  --   ALKPHOS 37* 34*  --   BILITOT 0.8 0.8  --   GFRNONAA 44* 37* 43*  ANIONGAP 11 15 13      Hematology Recent Labs  Lab 05/09/24 0420 05/10/24 0412  WBC 13.2* 9.6  RBC 4.81 3.94  HGB 14.0 11.5*  HCT 44.0 35.2*  MCV 91.5 89.3  MCH 29.1 29.2  MCHC 31.8 32.7  RDW 16.5* 16.5*  PLT 384 277    Cardiac EnzymesNo results for input(s): TROPONINI in the last 168 hours. No results for input(s): TROPIPOC in the last 168 hours.   BNP Recent Labs  Lab 05/09/24 0420  BNP 337.3*     DDimer No results for input(s): DDIMER in the last 168 hours.  Radiology    No results found.  Cardiac Studies   2D echo 01/29/2024: 1. Left ventricular ejection fraction, by estimation, is 25 to 30%. The  left ventricle has severely decreased function. The left ventricle  demonstrates global hypokinesis with severe hypokinesis of teh basal to  mid anterior/anteroseptal wall (apical  region not well visualized). The left ventricular internal cavity size was  mildly dilated. Left ventricular diastolic parameters are indeterminate.   2. Right ventricular systolic function is normal. The right ventricular  size is normal. There is normal pulmonary artery systolic pressure. The  estimated right ventricular systolic pressure is 34.4 mmHg.   3. The mitral valve is normal in structure. Mild to moderate mitral valve  regurgitation. No evidence of mitral stenosis. Moderate mitral annular  calcification.   4. The aortic valve is normal in structure. There is moderate  calcification of the aortic valve. Aortic valve regurgitation is not  visualized. Aortic valve  sclerosis/calcification is present, unable to  exclude stenosis (gradient not measured.   5. The inferior vena cava is normal in size with greater than 50%  respiratory variability, suggesting right atrial pressure of 3 mmHg.   Patient Profile     83 y.o. 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 Barbarann.   Assessment & Plan    1. Atrial tachycardia versus atypical atrial flutter:  - Present since admission with rate around 120 bpm, onset unclear  - Likely contributing to presentation  - Attempt to titrate Lopressor  to 25 mg twice daily - Avoid nondihydropyridine calcium channel blocker with cardiomyopathy and in the context of relative hypotension  - Avoid digoxin  with advanced age  - Discussed pursuing DCCV versus pharmacotherapy with the patient's daughter at bedside at time of cardiology consult who preferred a more conservative approach at this time with reloading of amiodarone , addition of beta blocker, and reserving potential DCCV for refractory tachycardia despite escalation of pharmacotherapy - Should DCCV be pursued, we will need to definitively confirm that she has not missed any doses of apixaban  given underlying A-fib and unclear timing of development of atrial tachycardia/flutter - Continue to reload with amiodarone  400 mg bid - Avoid amiodarone  drip at this time due to relative hypotension - TSH normal - Hypokalemia repleted  - Replete magnesium  to goal 2.0   2. Persistent A-fib:  - Currently in atrial tachycardia vs atypical atrial flutter with 2:1 conduction with rate of 120 bpm  - Unclear onset, therefore we would need to confirm that she has not missed any doses of apixaban  prior to considering pursuing  DCCV for atrial tachycardia if needed - Amiodarone  as above  - Continue apixaban  5 mg twice daily, does not meet reduced dosing criteria   3. HFrEF:  - Presumed to be secondary to Takotsubo cardiomyopathy  - Cardiac cath deferred previously given critical illness, age, and palliative care discussion  - Relative hypotension has led to the holding of GDMT, resume/escalate as able  - Consider repeating echo when she is out of atrial tachycardia/flutter and in sinus rhythm  - Diurese as indicated   4. Acute hypoxic respiratory failure:  - Resolved, now on room air  - Likely in the setting of atrial tachycardia in the context of significant cardiomyopathy with EF of 25 to 30%  -  High risk for recurrence while in atrial tachycardia   5. Elevated high-sensitivity troponin:  - Mildly elevated, likely reflective of supply/demand ischemia in the setting of atrial tachycardia and renal dysfunction  - Cardiac cath previously deferred as outlined above; no plans for inpatient ischemic evaluation at this time - On apixaban  in place of aspirin        For questions or updates, please contact [facility] Please consult [email or web address] for contact info under Cardiology/STEMI.    Signed, Bernardino Bring, PA-C [facility] Pager: [phone] 05/11/2024, 9:19 AM

## 2024-05-11 NOTE — Hospital Course (Signed)
 83yo with h/o COPD, chronic HFrEF, HTN, DM, and afib on Eliquis  who presented on 8/17 with SOB. She was previously admitted in 01/2024 with CAP and cardiogenic shock requiring mechanical ventilation, pressors, and milrinone  drip. O2 sats at SNF were 70% on room air. She is DNR/DNI. HR 119 (sinus tach), CXR with vascular congestion and left-sided pleural effusion. She was given Duonebs, Lasix , and Solumedrol with concern for CHF exacerbation. O2 sats here not <93%, stable on RA. Struggling with rate control, which is likely driving the CHF.

## 2024-05-11 NOTE — Progress Notes (Signed)
   05/11/24 0810  Assess: MEWS Score  Temp 97.7 F (36.5 C)  BP 102/79  MAP (mmHg) 88  Pulse Rate (!) 116  Resp 18  Level of Consciousness Alert  SpO2 98 %  O2 Device Room Air  Assess: MEWS Score  MEWS Temp 0  MEWS Systolic 0  MEWS Pulse 2  MEWS RR 0  MEWS LOC 0  MEWS Score 2  MEWS Score Color Yellow  Assess: if the MEWS score is Yellow or Red  Were vital signs accurate and taken at a resting state? Yes  Does the patient meet 2 or more of the SIRS criteria? No  MEWS guidelines implemented  No, previously yellow, continue vital signs every 4 hours  Assess: SIRS CRITERIA  SIRS Temperature  0  SIRS Respirations  0  SIRS Pulse 1  SIRS WBC 0  SIRS Score Sum  1

## 2024-05-11 NOTE — Evaluation (Signed)
 Occupational Therapy Evaluation Patient Details Name: Tanya Frye MRN: [id number] DOB: March 18, 1941 Today's Date: 05/11/2024   History of Present Illness   83 y.o. 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 Admitted for concern for CHF/COPD exacerbation   Clinical Impressions Ms West was seen for OT evaluation this date. Pt is poor historian, unable to state PLOF but per chart pt arrives from [facility]. Significantly garbled speech, but does state location as hospital. Pt currently requires SBA for bed mobility. MIN A + RW for ADL t/f ~8 ft. SpO2 98% on RA, HR 115 bpm with activity. Educated on PLB as pt demonstrates increased WOB with activity. Pt would benefit from skilled OT to address noted impairments and functional limitations (see below for any additional details). Upon hospital discharge, recommend return to facility.    If plan is discharge home, recommend the following:   A little help with walking and/or transfers;A little help with bathing/dressing/bathroom;Supervision due to cognitive status     Functional Status Assessment   Patient has had a recent decline in their functional status and demonstrates the ability to make significant improvements in function in a reasonable and predictable amount of time.     Equipment Recommendations   BSC/3in1     Recommendations for Other Services         Precautions/Restrictions   Precautions Precautions: Fall Recall of Precautions/Restrictions: Intact Restrictions Weight Bearing Restrictions Per Provider Order: No     Mobility Bed Mobility Overal bed mobility: Needs Assistance Bed Mobility: Supine to Sit     Supine to sit: Contact guard          Transfers Overall transfer level: Needs assistance Equipment used: Rolling walker (2  wheels) Transfers: Sit to/from Stand Sit to Stand: Min assist                  Balance Overall balance assessment: Needs assistance Sitting-balance support: No upper extremity supported, Feet supported Sitting balance-Leahy Scale: Fair     Standing balance support: Single extremity supported, During functional activity Standing balance-Leahy Scale: Poor                             ADL either performed or assessed with clinical judgement   ADL Overall ADL's : Needs assistance/impaired                                       General ADL Comments: MIN A + RW for ADL t/f ~8 ft.      Vision         Perception         Praxis         Pertinent Vitals/Pain Pain Assessment Pain Assessment: No/denies pain     Extremity/Trunk Assessment Upper Extremity Assessment Upper Extremity Assessment: Generalized weakness   Lower Extremity Assessment Lower Extremity Assessment: Generalized weakness       Communication Communication Communication: Impaired Factors Affecting Communication: Hearing impaired;Reduced clarity of speech   Cognition Arousal: Alert Behavior During Therapy: WFL for tasks assessed/performed Cognition: No family/caregiver present to determine baseline, Difficult to assess Difficult to assess due to: Hard of hearing/deaf           OT - Cognition Comments: garbled speech. States location  as hospital. Unable to state pt previously at [facility], reports living home alone                 Following commands: Intact       Cueing  General Comments   Cueing Techniques: Verbal cues  SpO2 98% on RA, HR 115 bpm with activity   Exercises     Shoulder Instructions      Home Living Family/patient expects to be discharged to:: Skilled nursing facility                                 Additional Comments: per chart pt arrived from [facility], pt unable to give updated information      Prior  Functioning/Environment Prior Level of Function : Patient poor historian/Family not available                    OT Problem List: Decreased strength;Decreased range of motion;Decreased activity tolerance;Impaired balance (sitting and/or standing);Decreased cognition;Decreased safety awareness   OT Treatment/Interventions: Self-care/ADL training;Therapeutic exercise;Energy conservation;DME and/or AE instruction;Therapeutic activities;Balance training;Patient/family education      OT Goals(Current goals can be found in the care plan section)   Acute Rehab OT Goals Patient Stated Goal: to go home OT Goal Formulation: With patient Time For Goal Achievement: 05/25/24 Potential to Achieve Goals: Good ADL Goals Pt Will Perform Grooming: with set-up;with supervision;standing Pt Will Perform Lower Body Dressing: with min assist;sit to/from stand Pt Will Transfer to Toilet: ambulating;regular height toilet;with supervision   OT Frequency:  Min 1X/week    Co-evaluation              AM-PAC OT 6 Clicks Daily Activity     Outcome Measure Help from another person eating meals?: None Help from another person taking care of personal grooming?: A Little Help from another person toileting, which includes using toliet, bedpan, or urinal?: A Lot Help from another person bathing (including washing, rinsing, drying)?: A Lot Help from another person to put on and taking off regular upper body clothing?: A Little Help from another person to put on and taking off regular lower body clothing?: A Lot 6 Click Score: 16   End of Session Equipment Utilized During Treatment: Rolling walker (2 wheels);Gait belt Nurse Communication: Mobility status  Activity Tolerance: Patient tolerated treatment well Patient left: in chair;with call bell/phone within reach;with chair alarm set  OT Visit Diagnosis: Other abnormalities of gait and mobility (R26.89);Muscle weakness (generalized) (M62.81)                 Time: 8669-8652 OT Time Calculation (min): 17 min Charges:  OT General Charges $OT Visit: 1 Visit OT Evaluation $OT Eval Low Complexity: 1 Low OT Treatments $Self Care/Home Management : 8-22 mins  Elston Slot, M.S. OTR/L  05/11/24, 1:56 PM  ascom [phone]

## 2024-05-12 ENCOUNTER — Inpatient Hospital Stay (HOSPITAL_COMMUNITY)
Admit: 2024-05-12 | Discharge: 2024-05-12 | Disposition: A | Discharge status: Home / Self Care | Attending: Cardiovascular Disease | Admitting: Cardiovascular Disease

## 2024-05-12 DIAGNOSIS — I4891 Unspecified atrial fibrillation: Secondary | ICD-10-CM

## 2024-05-12 DIAGNOSIS — J9601 Acute respiratory failure with hypoxia: Secondary | ICD-10-CM | POA: Diagnosis not present

## 2024-05-12 DIAGNOSIS — I429 Cardiomyopathy, unspecified: Secondary | ICD-10-CM

## 2024-05-12 DIAGNOSIS — I5021 Acute systolic (congestive) heart failure: Secondary | ICD-10-CM

## 2024-05-12 DIAGNOSIS — I483 Typical atrial flutter: Secondary | ICD-10-CM

## 2024-05-12 DIAGNOSIS — I5022 Chronic systolic (congestive) heart failure: Secondary | ICD-10-CM | POA: Diagnosis not present

## 2024-05-12 DIAGNOSIS — I5043 Acute on chronic combined systolic (congestive) and diastolic (congestive) heart failure: Secondary | ICD-10-CM

## 2024-05-12 DIAGNOSIS — I5023 Acute on chronic systolic (congestive) heart failure: Secondary | ICD-10-CM | POA: Insufficient documentation

## 2024-05-12 DIAGNOSIS — I4892 Unspecified atrial flutter: Secondary | ICD-10-CM | POA: Insufficient documentation

## 2024-05-12 LAB — BASIC METABOLIC PANEL WITH GFR
Anion gap: 8 (ref 5–15)
BUN: 20 mg/dL (ref 8–23)
CO2: 26 mmol/L (ref 22–32)
Calcium: 8.3 mg/dL — ABNORMAL LOW (ref 8.9–10.3)
Chloride: 103 mmol/L (ref 98–111)
Creatinine, Ser: 1.31 mg/dL — ABNORMAL HIGH (ref 0.44–1.00)
GFR, Estimated: 41 mL/min — ABNORMAL LOW (ref >60)
Glucose, Bld: 102 mg/dL — ABNORMAL HIGH (ref 70–99)
Potassium: 4.1 mmol/L (ref 3.5–5.1)
Sodium: 137 mmol/L (ref 135–145)

## 2024-05-12 LAB — GLUCOSE, CAPILLARY
Glucose-Capillary: 97 mg/dL (ref 70–99)
Glucose-Capillary: 110 mg/dL — ABNORMAL HIGH (ref 70–99)
Glucose-Capillary: 147 mg/dL — ABNORMAL HIGH (ref 70–99)
Glucose-Capillary: 70 mg/dL (ref 70–99)

## 2024-05-12 LAB — CBC WITH DIFFERENTIAL/PLATELET
Abs Immature Granulocytes: 0.04 K/uL (ref 0.00–0.07)
Basophils Absolute: 0.1 K/uL (ref 0.0–0.1)
Basophils Relative: 1 %
Eosinophils Absolute: 0.4 K/uL (ref 0.0–0.5)
Eosinophils Relative: 4 %
HCT: 37.2 % (ref 36.0–46.0)
Hemoglobin: 11.8 g/dL — ABNORMAL LOW (ref 12.0–15.0)
Immature Granulocytes: 0 %
Lymphocytes Relative: 29 %
Lymphs Abs: 3 K/uL (ref 0.7–4.0)
MCH: 29.4 pg (ref 26.0–34.0)
MCHC: 31.7 g/dL (ref 30.0–36.0)
MCV: 92.5 fL (ref 80.0–100.0)
Monocytes Absolute: 0.7 K/uL (ref 0.1–1.0)
Monocytes Relative: 7 %
Neutro Abs: 6.3 K/uL (ref 1.7–7.7)
Neutrophils Relative %: 59 %
Platelets: 264 K/uL (ref 150–400)
RBC: 4.02 MIL/uL (ref 3.87–5.11)
RDW: 16.4 % — ABNORMAL HIGH (ref 11.5–15.5)
WBC: 10.5 K/uL (ref 4.0–10.5)
nRBC: 0 % (ref 0.0–0.2)

## 2024-05-12 LAB — ECHOCARDIOGRAM LIMITED
Calc EF: 43.1 %
Height: 64 in
Single Plane A2C EF: 36 %
Single Plane A4C EF: 50 %
Weight: 2808 [oz_av]

## 2024-05-12 MED ORDER — FUROSEMIDE 10 MG/ML IJ SOLN
20.0000 mg | Freq: Once | INTRAMUSCULAR | Status: AC
  Administered 2024-05-12: 20 mg via INTRAVENOUS
  Filled 2024-05-12: qty 2

## 2024-05-12 MED ORDER — DIGOXIN 0.25 MG/ML IJ SOLN
0.2500 mg | Freq: Once | INTRAMUSCULAR | Status: AC
  Administered 2024-05-12: 0.25 mg via INTRAVENOUS
  Filled 2024-05-12: qty 2

## 2024-05-12 MED ORDER — INSULIN GLARGINE 100 UNIT/ML ~~LOC~~ SOLN
10.0000 [IU] | Freq: Two times a day (BID) | SUBCUTANEOUS | Status: DC
  Administered 2024-05-12: 10 [IU] via SUBCUTANEOUS
  Filled 2024-05-12 (×2): qty 0.1

## 2024-05-12 MED ORDER — INSULIN GLARGINE 100 UNIT/ML ~~LOC~~ SOLN
10.0000 [IU] | Freq: Every day | SUBCUTANEOUS | Status: DC
  Filled 2024-05-12: qty 0.1

## 2024-05-12 NOTE — TOC Initial Note (Addendum)
 Transition of Care [facility]) - Initial/Assessment Note    Patient Details  Name: Tanya Frye MRN: [id number] Date of Birth: May 13, 1941  Transition of Care [facility]) CM/SW Contact:    Lauraine JAYSON Carpen, LCSW Phone Number: 05/12/2024, 11:39 AM  Clinical Narrative:  No family at bedside. CSW called daughter-in-law, introduced role, and explained that discharge planning would be discussed. Patient has been at [facility] for rehab since 5/23 (about 85 days). Daughter would like for her to return home with home health at discharge. CSW emailed CMS list to her for review. Patient lives with her grandson but he is not available during the day due to his work schedule. Daughter-in-law said she would not be able to be home alone. Explained that home health only comes out 2-3 times a week for 45 minutes to an hour. Discussed potential for paying privately for personal care services. Daughter-in-law gave permission to make referral to [facility] [employer] to discuss PCS options. Referral given to Danielle. Patient has a new PCP Ladell Bathe, MD) but was supposed to have her first appointment yesterday. May need to see if one of the MD supervisors can cover her orders until first appt. No further concerns. CSW will continue to follow patient and her daughter-in-law for support and facilitate return home once medically stable.                Expected Discharge Plan: Home w Home Health Services Barriers to Discharge: Continued Medical Work up   Patient Goals and CMS Choice   CMS Medicare.gov Compare Post Acute Care list provided to:: Patient Represenative (must comment) (Emailed to daughter-in-law)        Expected Discharge Plan and Services     Post Acute Care Choice: Home Health Living arrangements for the past 2 months: Single Family Home                                      Prior Living Arrangements/Services Living arrangements for the past 2 months: Single Family Home Lives with::  Relatives Patient language and need for interpreter reviewed:: Yes Do you feel safe going back to the place where you live?: Yes      Need for Family Participation in Patient Care: Yes (Comment) Care giver support system in place?: No (comment)   Criminal Activity/Legal Involvement Pertinent to Current Situation/Hospitalization: No - Comment as needed  Activities of Daily Living      Permission Sought/Granted Permission sought to share information with : Facility Medical sales representative, Family Supports    Share Information with NAME: Jodie Carmer  Permission granted to share info w AGENCY: Home Health Agencies  Permission granted to share info w Relationship: Daughter-in-law  Permission granted to share info w Contact Information: [phone]  Emotional Assessment Appearance:: Appears stated age Attitude/Demeanor/Rapport: Unable to Assess Affect (typically observed): Unable to Assess Orientation: : Oriented to Self, Oriented to Place, Oriented to Situation Alcohol / Substance Use: Not Applicable Psych Involvement: No (comment)  Admission diagnosis:  COPD exacerbation (HCC) [J44.1] CHF exacerbation (HCC) [I50.9] Acute respiratory failure with hypoxia (HCC) [J96.01] Acute on chronic congestive heart failure, unspecified heart failure type [facility]) [I50.9] Patient Active Problem List   Diagnosis Date Noted   Typical atrial flutter (HCC) 05/12/2024   Acute on chronic combined systolic and diastolic CHF (congestive heart failure) (HCC) 05/12/2024   CHF exacerbation (HCC) 05/09/2024   Pressure injury of skin 02/04/2024  Atrial fibrillation with RVR (HCC) 02/01/2024   Sepsis (HCC) 01/29/2024   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   Non-STEMI (non-ST elevated myocardial infarction) (HCC) 01/29/2024   Community acquired pneumonia 01/29/2024   Cardiogenic shock (HCC) 01/29/2024   Acute respiratory failure (HCC) 01/28/2024   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    Tachycardia 08/29/2020   Need for influenza vaccination 05/30/2020   Seasonal allergic rhinitis due to pollen 02/24/2020   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 [place] [address] Phone: [phone] Fax: [phone]  [facility] [place], [place] - [address] [place] [address] Phone: [phone] Fax: [phone]     Social Drivers of Health (SDOH) Social History: SDOH Screenings   Food Insecurity: Patient Unable To Answer (05/11/2024)  Housing: Unknown (05/11/2024)  Transportation Needs: Patient Unable To Answer (05/11/2024)  Utilities: Patient Unable To Answer (05/11/2024)  Alcohol Screen: Low Risk  (09/05/2021)  Depression (PHQ2-9): Low Risk  (09/05/2021)  Financial Resource Strain: Low Risk  (09/05/2021)  Physical Activity: Inactive (09/05/2021)  Social Connections: Unknown (05/11/2024)  Stress: No Stress Concern Present (09/05/2021)  Tobacco Use: Low Risk  (04/19/2024)   Received from [facility]   SDOH Interventions:     Readmission Risk Interventions     No data to display

## 2024-05-12 NOTE — Plan of Care (Signed)
  Problem: Education: Goal: Ability to describe self-care measures that may prevent or decrease complications (Diabetes Survival Skills Education) will improve Outcome: Progressing   Problem: Coping: Goal: Ability to adjust to condition or change in health will improve Outcome: Progressing   Problem: Metabolic: Goal: Ability to maintain appropriate glucose levels will improve Outcome: Progressing   Problem: Nutritional: Goal: Maintenance of adequate nutrition will improve Outcome: Progressing   Problem: Skin Integrity: Goal: Risk for impaired skin integrity will decrease Outcome: Progressing   

## 2024-05-12 NOTE — Care Management Important Message (Signed)
 Important Message  Patient Details  Name: Tanya Frye MRN: [id number] Date of Birth: 1941/09/23   Important Message Given:  Yes - Medicare IM     [name] 05/12/2024, 12:33 PM

## 2024-05-12 NOTE — Evaluation (Signed)
 Physical Therapy Evaluation Patient Details Name: Tanya Frye MRN: [id number] DOB: [birth year] Today's Date: 05/12/2024  History of Present Illness  83 y.o. 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 Admitted for concern for CHF/COPD exacerbation.   Clinical Impression  Pt admitted with above diagnosis. Pt currently with functional limitations due to the deficits listed below (see PT Problem List). Pt received upright in bed agreeable to PT eval. Pt extremely HOH therefore not an accurate historian. Generally A&Ox3 to person, place, situation. PTA pt came from [facility].   To date, pt reliant on mod to max multi modal cuing due to being [facility]. Follows all commands well with these cues. MinA needed from supine to sit with bed features and CGA for STS, SPT using RW to recliner. Pt initially reporting she can not walk but has goals to walk better. With encouragement and education, pt agreeable to trial ambulation as pt transfers with ease to recliner. Pt only needs CGA for STS to RW and CGA with chair follow for ambulation ~25'. Pt with slow but consistent cadence with minimal step through pattern but is generally steady using the RW. Pt reports needing seated rest due to fatigue but no notable SOB or increased WOB appreciated. SPO2 and HR WNL. Pt returned upright in recliner with all needs in reach. Anticipate pt is close to functional baseline. Encourage return to prior venue of care. PT to maintain pt on caseload to prevent acute deconditioning.      If plan is discharge home, recommend the following: A little help with walking and/or transfers;A little help with bathing/dressing/bathroom;Assistance with cooking/housework;Assist for transportation;Help with stairs or ramp for entrance   Can travel by private vehicle    Yes    Equipment Recommendations None recommended by PT  Recommendations for Other Services       Functional Status Assessment Patient has had a recent decline in their functional status and demonstrates the ability to make significant improvements in function in a reasonable and predictable amount of time.     Precautions / Restrictions Precautions Precautions: Fall Recall of Precautions/Restrictions: Intact Restrictions Weight Bearing Restrictions Per Provider Order: No      Mobility  Bed Mobility Overal bed mobility: Needs Assistance Bed Mobility: Supine to Sit     Supine to sit: Min assist, Used rails, HOB elevated       Patient Response: Cooperative  Transfers Overall transfer level: Needs assistance Equipment used: Rolling walker (2 wheels) Transfers: Sit to/from Stand, Bed to chair/wheelchair/BSC Sit to Stand: Contact guard assist   Step pivot transfers: Contact guard assist            Ambulation/Gait Ambulation/Gait assistance: Contact guard assist Gait Distance (Feet): 25 Feet Assistive device: Rolling walker (2 wheels) Gait Pattern/deviations: Step-through pattern, Decreased step length - right, Decreased step length - left       General Gait Details: completes 36' with chair follow for safety due to being [facility].  Stairs            Wheelchair Mobility     Tilt Bed Tilt Bed Patient Response: Cooperative  Modified Rankin (Stroke Patients Only)       Balance Overall balance assessment: Needs assistance Sitting-balance support: No upper extremity supported, Feet supported Sitting balance-Leahy Scale: Fair     Standing balance support: No upper extremity supported Standing balance-Leahy Scale: Fair Standing balance comment:  Able to stand without BUE support                             Pertinent Vitals/Pain Pain Assessment Pain Assessment: Faces Faces Pain Scale: No hurt    Home Living Family/patient expects to be  discharged to:: Skilled nursing facility                   Additional Comments: per chart pt arrived from [facility], pt unable to give updated information    Prior Function Prior Level of Function : Patient poor historian/Family not available                     Extremity/Trunk Assessment   Upper Extremity Assessment Upper Extremity Assessment: Defer to OT evaluation    Lower Extremity Assessment Lower Extremity Assessment: Generalized weakness       Communication   Communication Communication: Impaired Factors Affecting Communication: Hearing impaired;Reduced clarity of speech    Cognition Arousal: Alert Behavior During Therapy: WFL for tasks assessed/performed   PT - Cognitive impairments: Difficult to assess Difficult to assess due to: Hard of hearing/deaf                     PT - Cognition Comments: A&Ox3. Cognition good enough to uitlize the white board to assist in answering questions. When pt can hear my questions she responds appropriately. Just takes multiple attempts and multi modal cuing for command following. Following commands: Intact       Cueing Cueing Techniques: Verbal cues, Gestural cues, Tactile cues, Visual cues     General Comments General comments (skin integrity, edema, etc.): SPO2 > 90% on RA with rest and post activity. HR maintaining ~109-110 BPM with gait.    Exercises     Assessment/Plan    PT Assessment Patient needs continued PT services  PT Problem List Decreased strength;Decreased mobility;Decreased activity tolerance;Decreased cognition;Decreased balance       PT Treatment Interventions DME instruction;Therapeutic exercise;Gait training;Balance training;Neuromuscular re-education;Functional mobility training;Therapeutic activities;Patient/family education;Cognitive remediation    PT Goals (Current goals can be found in the Care Plan section)  Acute Rehab PT Goals PT Goal Formulation: Patient unable to  participate in goal setting Time For Goal Achievement: 05/26/24    Frequency Min 1X/week     Co-evaluation               AM-PAC PT 6 Clicks Mobility  Outcome Measure Help needed turning from your back to your side while in a flat bed without using bedrails?: A Little Help needed moving from lying on your back to sitting on the side of a flat bed without using bedrails?: A Little Help needed moving to and from a bed to a chair (including a wheelchair)?: A Little Help needed standing up from a chair using your arms (e.g., wheelchair or bedside chair)?: A Little Help needed to walk in hospital room?: A Little Help needed climbing 3-5 steps with a railing? : Total 6 Click Score: 16    End of Session Equipment Utilized During Treatment: Gait belt Activity Tolerance: Patient tolerated treatment well Patient left: in chair;with call bell/phone within reach;with chair alarm set Nurse Communication: Mobility status PT Visit Diagnosis: Muscle weakness (generalized) (M62.81);Other abnormalities of gait and mobility (R26.89)    Time: 8986-8968 PT Time Calculation (min) (ACUTE ONLY): 18 min   Charges:   PT Evaluation $PT Eval Low Complexity: 1 Low  PT General Charges $$ ACUTE PT VISIT: 1 Visit        Dorina HERO. Fairly IV, PT, DPT Physical Therapist- [place]  [facility] 05/12/2024, 10:44 AM

## 2024-05-12 NOTE — Progress Notes (Signed)
  Progress Note   Patient: Tanya Frye FMW:[id number] DOB: 1940-12-13 DOA: 05/09/2024     2 DOS: the patient was seen and examined on 05/12/2024   Brief hospital course: 83yo with h/o COPD, chronic HFrEF, HTN, DM, and afib on Eliquis  who presented on 8/17 with SOB. She was previously admitted in 01/2024 with CAP and cardiogenic shock requiring mechanical ventilation, pressors, and milrinone  drip. O2 sats at SNF were 70% on room air. She is DNR/DNI. HR 119 (sinus tach), CXR with vascular congestion and left-sided pleural effusion. She was given Duonebs, Lasix , and Solumedrol with concern for CHF exacerbation. O2 sats here not <93%, stable on RA.  Patient is seen by cardiology, treated with digoxin , beta-blocker and amiodarone  for atrial flutter.   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 Received IV Lasix , condition has improved.  Off oxygen.   Persistent atrial fibrillation with RVR. Atrial flutter. Continue telemetry monitor, continue anticoagulation with Eliquis . Patient was given digoxin  and a beta-blocker.  Will continue to follow.  Appreciate cardiology consult   Stage 3b CKD Patient stable    Type 2 diabetes Glucose not significant elevated, decrease insulin  glargine to daily.  Continue sliding scale insulin .   Hypothyroidism Continue Synthroid    HTN Continue beta-blocker.   COPD Continue DuoNebs No apparent exacerbation at this time   Elevated lactic acid Likely  due to tachycardia and hypoxemia No concern for sepsis  Obesity with BMI 30.12. Diet and excise.  Discharge planning. Patient came to the hospital from [facility], but her Medicare days are running out.  Discussed with daughter-in-law, they will take her home when condition is more stable.  Will set up home care.    Subjective:  Patient is very hard of hearing, denies any short of breath.  Physical Exam: Vitals:   05/12/24 0355 05/12/24 0803 05/12/24 0935 05/12/24 1158  BP: (!) 90/58 100/62 105/71 96/80  Pulse: 68 90 (!) 106 80  Resp: 18 18  18   Temp: 97.8 F (36.6 C) 97.9 F (36.6 C)  98 F (36.7 C)  TempSrc:      SpO2: 99% 100% 100% 97%  Weight:      Height:       General exam: Appears calm and comfortable  Respiratory system: Clear to auscultation. Respiratory effort normal. Cardiovascular system: Tachycardic,. No JVD, murmurs, rubs, gallops or clicks. No pedal edema. Gastrointestinal system: Abdomen is nondistended, soft and nontender. No organomegaly or masses felt. Normal bowel sounds heard. Central nervous system: Alert and oriented x3. No focal neurological deficits. Extremities: Symmetric 5 x 5 power. Skin: No rashes, lesions or ulcers Psychiatry: Judgement and insight appear normal. Mood & affect appropriate.    Data Reviewed:  Lab results reviewed.  Family Communication: daughter in law updated over phone  Disposition: Status is: Inpatient Remains inpatient appropriate because: Severity of disease, IV treatment      Time spent: 40 minutes  Author: Murvin Mana, MD 05/12/2024 1:56 PM  For on call review [email or web address].

## 2024-05-12 NOTE — Progress Notes (Signed)
 Progress Note  Patient Name: Tanya Frye Date of Encounter: 05/12/2024 [facility] Cardiologist: Evalene Lunger, MD   Interval Summary    Patient is overall feeling well. Breathing is good. Afib rates 90-120s with soft pressures.  Vital Signs Vitals:   05/11/24 1933 05/11/24 2300 05/11/24 2353 05/12/24 0355  BP: 95/69 100/67 96/68 (!) 90/58  Pulse: (!) 107 (!) 109 (!) 109 68  Resp: 18  18 18   Temp: 97.9 F (36.6 C)  97.6 F (36.4 C) 97.8 F (36.6 C)  TempSrc:      SpO2: 100%  100% 99%  Weight:      Height:        Intake/Output Summary (Last 24 hours) at 05/12/2024 0752 Last data filed at 05/11/2024 2300 Gross per 24 hour  Intake 1320 ml  Output 250 ml  Net 1070 ml      05/09/2024    4:43 AM 02/13/2024    5:00 AM 02/12/2024    5:00 AM  Last 3 Weights  Weight (lbs) 175 lb 8 oz 184 lb 15.5 oz 180 lb 8.9 oz  Weight (kg) 79.606 kg 83.9 kg 81.9 kg      Telemetry/ECG  Will order tele - Personally Reviewed  Physical Exam  GEN: No acute distress.   Neck: No JVD Cardiac: Irreg IRreg, no murmurs, rubs, or gallops.  Respiratory: Clear to auscultation bilaterally. GI: Soft, nontender, non-distended  MS: +lower extremity edema into thighs   Cardiac Studies    2D echo 01/29/2024: 1. Left ventricular ejection fraction, by estimation, is 25 to 30%. The  left ventricle has severely decreased function. The left ventricle  demonstrates global hypokinesis with severe hypokinesis of teh basal to  mid anterior/anteroseptal wall (apical  region not well visualized). The left ventricular internal cavity size was  mildly dilated. Left ventricular diastolic parameters are indeterminate.   2. Right ventricular systolic function is normal. The right ventricular  size is normal. There is normal pulmonary artery systolic pressure. The  estimated right ventricular systolic pressure is 34.4 mmHg.   3. The mitral valve is normal in structure. Mild to moderate mitral valve   regurgitation. No evidence of mitral stenosis. Moderate mitral annular  calcification.   4. The aortic valve is normal in structure. There is moderate  calcification of the aortic valve. Aortic valve regurgitation is not  visualized. Aortic valve sclerosis/calcification is present, unable to  exclude stenosis (gradient not measured.   5. The inferior vena cava is normal in size with greater than 50%  respiratory variability, suggesting right atrial pressure of 3 mmHg.    Patient Profile     83 y.o. 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   Assessment & Plan   Atrial tachycardia vs atypical atrial flutter - presented on admission with rates in the 120s - Lopressor  25mg  BID - avoid CCB given cardiomyopathy - family favors a more conservative approach, we will continue loading amiodarone  - she is on Eliquis  for underlying Afib - dig IV x 1 followed by oral dig  - amiodarone  400mg  BID - may need midodrine if pressures drop  Persistent Afib - currently in Atrial tachycardia vs atypical aflutter with 2:1 conduction with rate of 120bpm - unclear onset - continue Eliquis  as above  HFrEF - echo showed LVEF  25-30%  - presumed to be secondary to Tokotsubo CM - cardiac cath deferred due to critical illness, age, and palliative care discussion - relative hypotension led to holding of GDMT - repeat limited echo given improved rates - she has lower extremity edema on exam - start IV lasix  20mg  daily and monitor response  Acute hypoxic respiratory failure - resolved, now on room air  Elevated HS troponin - mildly elevated, suspect supply demand mismatch in the setting of atrial tachycardia and renal dysfunction - cardiac cath previously  deferred as outlined above - no ASA given Eliquis     For questions or updates, please contact [facility] Please consult [email or web address] for contact info under       Signed, Quentyn Kolbeck VEAR Fishman, PA-C

## 2024-05-13 DIAGNOSIS — I5022 Chronic systolic (congestive) heart failure: Secondary | ICD-10-CM | POA: Diagnosis not present

## 2024-05-13 DIAGNOSIS — I5043 Acute on chronic combined systolic (congestive) and diastolic (congestive) heart failure: Secondary | ICD-10-CM | POA: Diagnosis not present

## 2024-05-13 DIAGNOSIS — J441 Chronic obstructive pulmonary disease with (acute) exacerbation: Secondary | ICD-10-CM

## 2024-05-13 DIAGNOSIS — J449 Chronic obstructive pulmonary disease, unspecified: Secondary | ICD-10-CM

## 2024-05-13 DIAGNOSIS — I4891 Unspecified atrial fibrillation: Secondary | ICD-10-CM | POA: Diagnosis not present

## 2024-05-13 DIAGNOSIS — J9601 Acute respiratory failure with hypoxia: Secondary | ICD-10-CM | POA: Diagnosis not present

## 2024-05-13 DIAGNOSIS — I4892 Unspecified atrial flutter: Secondary | ICD-10-CM | POA: Diagnosis not present

## 2024-05-13 LAB — GLUCOSE, CAPILLARY
Glucose-Capillary: 79 mg/dL (ref 70–99)
Glucose-Capillary: 185 mg/dL — ABNORMAL HIGH (ref 70–99)
Glucose-Capillary: 129 mg/dL — ABNORMAL HIGH (ref 70–99)
Glucose-Capillary: 130 mg/dL — ABNORMAL HIGH (ref 70–99)

## 2024-05-13 LAB — BASIC METABOLIC PANEL WITH GFR
Anion gap: 7 (ref 5–15)
BUN: 20 mg/dL (ref 8–23)
CO2: 28 mmol/L (ref 22–32)
Calcium: 8.2 mg/dL — ABNORMAL LOW (ref 8.9–10.3)
Chloride: 103 mmol/L (ref 98–111)
Creatinine, Ser: 1.39 mg/dL — ABNORMAL HIGH (ref 0.44–1.00)
GFR, Estimated: 38 mL/min — ABNORMAL LOW (ref >60)
Glucose, Bld: 62 mg/dL — ABNORMAL LOW (ref 70–99)
Potassium: 4 mmol/L (ref 3.5–5.1)
Sodium: 138 mmol/L (ref 135–145)

## 2024-05-13 LAB — DIGOXIN LEVEL: Digoxin Level: 0.5 ng/mL — ABNORMAL LOW (ref 0.8–2.0)

## 2024-05-13 LAB — MAGNESIUM: Magnesium: 2.1 mg/dL (ref 1.7–2.4)

## 2024-05-13 MED ORDER — FUROSEMIDE 10 MG/ML IJ SOLN
20.0000 mg | Freq: Once | INTRAMUSCULAR | Status: AC
  Administered 2024-05-13: 20 mg via INTRAVENOUS
  Filled 2024-05-13: qty 2

## 2024-05-13 MED ORDER — INSULIN GLARGINE 100 UNIT/ML ~~LOC~~ SOLN
6.0000 [IU] | Freq: Every day | SUBCUTANEOUS | Status: DC
  Administered 2024-05-13: 6 [IU] via SUBCUTANEOUS
  Filled 2024-05-13: qty 0.06

## 2024-05-13 MED ORDER — INSULIN GLARGINE 100 UNIT/ML ~~LOC~~ SOLN: 10.0000 [IU] | Freq: Every day | SUBCUTANEOUS | Status: DC

## 2024-05-13 MED ORDER — INSULIN ASPART 100 UNIT/ML IJ SOLN
0.0000 [IU] | Freq: Three times a day (TID) | INTRAMUSCULAR | Status: DC
  Administered 2024-05-13 – 2024-05-14 (×2): 1 [IU] via SUBCUTANEOUS
  Filled 2024-05-13 (×3): qty 1

## 2024-05-13 MED ORDER — INSULIN GLARGINE 100 UNIT/ML ~~LOC~~ SOLN
6.0000 [IU] | Freq: Every day | SUBCUTANEOUS | Status: DC
  Administered 2024-05-13: 6 [IU] via SUBCUTANEOUS
  Filled 2024-05-13 (×2): qty 0.06

## 2024-05-13 MED ORDER — DIGOXIN 125 MCG PO TABS
0.1250 mg | ORAL_TABLET | Freq: Every day | ORAL | Status: DC
  Administered 2024-05-13: 0.125 mg via ORAL
  Filled 2024-05-13 (×2): qty 1

## 2024-05-13 NOTE — TOC Progression Note (Signed)
 Transition of Care [facility]) - Progression Note    Patient Details  Name: Tanya Frye MRN: [id number] Date of Birth: October 13, 1940  [facility]) CM/SW Contact  Lauraine JAYSON Carpen, LCSW Phone Number: 05/13/2024, 11:23 AM  Clinical Narrative:  CSW spoke to [facility]. She has discussed personal care services with daughter-in-law. Plan is for patient to return to [facility] to use up her last 15 rehab days. [facility] will have PCS assessment completed at the facility. Daughter-in-law will be going out of town next week. CSW called daughter-in-law and confirmed plan.   Expected Discharge Plan: Home w Home Health Services Barriers to Discharge: Continued Medical Work up               Expected Discharge Plan and Services     Post Acute Care Choice: Home Health Living arrangements for the past 2 months: Single Family Home                                       Social Drivers of Health (SDOH) Interventions SDOH Screenings   Food Insecurity: Patient Unable To Answer (05/11/2024)  Housing: Unknown (05/11/2024)  Transportation Needs: Patient Unable To Answer (05/11/2024)  Utilities: Patient Unable To Answer (05/11/2024)  Alcohol Screen: Low Risk  (09/05/2021)  Depression (PHQ2-9): Low Risk  (09/05/2021)  Financial Resource Strain: Low Risk  (09/05/2021)  Physical Activity: Inactive (09/05/2021)  Social Connections: Unknown (05/11/2024)  Stress: No Stress Concern Present (09/05/2021)  Tobacco Use: Low Risk  (04/19/2024)   Received from [facility]    Readmission Risk Interventions     No data to display

## 2024-05-13 NOTE — [place] FL2 (Signed)
 [place]  MEDICAID FL2 LEVEL OF CARE FORM     IDENTIFICATION  Patient Name: Tanya Frye Birthdate: 1940/12/05 Sex: female Admission Date (Current Location): 05/09/2024  [facility] and [place] Number:  Chiropodist and Address:  [facility], [place], [address], [place], [place] [address]      Provider Number: [id number]  Attending Physician Name and Address:  Laurita Pillion, MD  Relative Name and Phone Number:       Current Level of Care: Hospital Recommended Level of Care: Skilled Nursing Facility Prior Approval Number:    Date Approved/Denied:   PASRR Number: [id number]  Discharge Plan: SNF    Current Diagnoses: Patient Active Problem List   Diagnosis Date Noted   Typical atrial flutter (HCC) 05/12/2024   Acute on chronic combined systolic and diastolic CHF (congestive heart failure) (HCC) 05/12/2024   CHF exacerbation (HCC) 05/09/2024   Pressure injury of skin 02/04/2024   Atrial fibrillation with RVR (HCC) 02/01/2024   Sepsis (HCC) 01/29/2024   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   Non-STEMI (non-ST elevated myocardial infarction) (HCC) 01/29/2024   Community acquired pneumonia 01/29/2024   Cardiogenic shock (HCC) 01/29/2024   Acute respiratory failure (HCC) 01/28/2024   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   Tachycardia 08/29/2020   Need for influenza vaccination 05/30/2020   Seasonal allergic rhinitis due to pollen 02/24/2020   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, unsp (HCC) 02/24/2020   Morbid obesity (HCC) 02/24/2020   Essential hypertension 02/24/2020   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    Orientation RESPIRATION BLADDER Height & Weight     Self, Place  Normal Continent, External catheter Weight: 175 lb 8 oz (79.6 kg) Height:  5' 4 (162.6 cm)  BEHAVIORAL SYMPTOMS/MOOD NEUROLOGICAL BOWEL NUTRITION STATUS   (None)  (None) Continent Diet (DYS 3)  AMBULATORY STATUS COMMUNICATION OF NEEDS  Skin   Limited Assist Verbally Other (Comment), PU Stage and Appropriate Care (Erythema/redness.) PU Stage 1 Dressing:  (Sacrum: Foam prn.)                     Personal Care Assistance Level of Assistance  Bathing, Feeding, Dressing Bathing Assistance: Limited assistance Feeding assistance: Limited assistance Dressing Assistance: Limited assistance     Functional Limitations Info  Sight, Speech, Hearing Sight Info: Adequate Hearing Info: Impaired Speech Info: Adequate    SPECIAL CARE FACTORS FREQUENCY  PT (By licensed PT), OT (By licensed OT)     PT Frequency: 5 x week OT Frequency: 5 x week            Contractures Contractures Info: Not present    Additional Factors Info  Code Status, Allergies Code Status Info: DNR Allergies Info: Penicillins           Current Medications (05/13/2024):  This is the current hospital active medication list Current Facility-Administered Medications  Medication Dose Route Frequency Provider Last Rate Last Admin   acetaminophen  (TYLENOL ) tablet 650 mg  650 mg Oral Q6H PRN Duncan, Hazel V, MD       Or   acetaminophen  (TYLENOL ) suppository 650 mg  650 mg Rectal Q6H PRN Duncan, Hazel V, MD       amiodarone  (PACERONE ) tablet 400 mg  400 mg Oral BID Abigail Motto M, PA-C   400 mg at 05/13/24 0935   apixaban  (ELIQUIS ) tablet 5 mg  5 mg Oral BID Paudel, Keshab, MD   5 mg at 05/13/24 0935   digoxin  (LANOXIN ) tablet 0.125 mg  0.125 mg Oral Daily Gollan, Timothy J, MD   0.125 mg at 05/13/24 0935   docusate sodium  (COLACE) capsule 100 mg  100 mg Oral BID Barbarann Nest, MD   100 mg at 05/12/24 2131   feeding supplement (ENSURE ENLIVE / ENSURE PLUS) liquid 237 mL  237 mL Oral BID BM Barbarann Nest, MD   237 mL at 05/13/24 9062   fluticasone  (FLONASE ) 50 MCG/ACT nasal spray 1 spray  1 spray Each Nare Daily Barbarann Nest, MD   1 spray at 05/12/24 9061   insulin  aspart (novoLOG ) injection 0-6 Units  0-6 Units Subcutaneous TID WC Zhang, Dekui, MD        insulin  glargine (LANTUS ) injection 6 Units  6 Units Subcutaneous Daily Zhang, Dekui, MD   6 Units at 05/13/24 0935   levothyroxine  (SYNTHROID ) tablet 75 mcg  75 mcg Oral QAC breakfast Roann Gouty, MD   75 mcg at 05/13/24 9389   loratadine  (CLARITIN ) tablet 10 mg  10 mg Oral Daily Barbarann Nest, MD   10 mg at 05/13/24 0935   magnesium  oxide (MAG-OX) tablet 400 mg  400 mg Oral Daily Abigail Motto M, PA-C   400 mg at 05/13/24 0935   metoprolol  tartrate (LOPRESSOR ) tablet 25 mg  25 mg Oral BID Abigail Motto M, PA-C   25 mg at 05/13/24 0935   polyethylene glycol (MIRALAX  / GLYCOLAX ) packet 17 g  17 g Oral Daily Barbarann Nest, MD   17 g at 05/12/24 9062   pravastatin  (PRAVACHOL ) tablet 10 mg  10 mg Oral q1800 Barbarann Nest, MD   10 mg at 05/12/24 1654     Discharge Medications: Please see discharge summary for a list of discharge medications.  Relevant Imaging Results:  Relevant Lab Results:   Additional Information SS#: [id number]  Lauraine JAYSON Carpen, LCSW

## 2024-05-13 NOTE — Progress Notes (Signed)
 Pt has a stage 1 sacral wound, noted upon arrival to 2A on 05/10/24. Sacral patch applied, wound care followed per protocol. MD aware.

## 2024-05-13 NOTE — Progress Notes (Signed)
 Progress Note  Patient Name: Tanya Frye Date of Encounter: 05/13/2024 [facility] Cardiologist: Evalene Lunger, MD   Interval Summary    Pressures still intermittently low. UOP -1.7L. swelling is improving. Afib rates in the 80s. Kidney function stable. No chest pain or SOB reported.   Vital Signs Vitals:   05/12/24 1947 05/13/24 0010 05/13/24 0448 05/13/24 0548  BP: (!) 107/52 103/67 96/66 105/68  Pulse: 85  (!) 37 89  Resp: 17 20 19 18   Temp: 98.2 F (36.8 C) 98.4 F (36.9 C) 98.5 F (36.9 C)   TempSrc:      SpO2: 99% 100% 97%   Weight:      Height:        Intake/Output Summary (Last 24 hours) at 05/13/2024 0832 Last data filed at 05/12/2024 2300 Gross per 24 hour  Intake 240 ml  Output 1700 ml  Net -1460 ml      05/09/2024    4:43 AM 02/13/2024    5:00 AM 02/12/2024    5:00 AM  Last 3 Weights  Weight (lbs) 175 lb 8 oz 184 lb 15.5 oz 180 lb 8.9 oz  Weight (kg) 79.606 kg 83.9 kg 81.9 kg      Telemetry/ECG  N/A  - Personally Reviewed  Physical Exam  GEN: No acute distress.   Neck: No JVD Cardiac: Irreg IRreg, no murmurs, rubs, or gallops.  Respiratory: Clear to auscultation bilaterally. GI: Soft, nontender, non-distended  MS: + lower leg edema     Cardiac Studies    Echo limited 05/12/24  1. Left ventricular ejection fraction, by estimation, is 30 to 35%. The  left ventricle has moderately decreased function. The left ventricle  demonstrates global hypokinesis. Left ventricular diastolic parameters are  indeterminate.   2. Right ventricular systolic function is mildly reduced. The right  ventricular size is moderately enlarged. Tricuspid regurgitation signal is  inadequate for assessing PA pressure.   3. Left atrial size was severely dilated.   4. Moderate pleural effusion in the left lateral region.   5. The mitral valve is degenerative. Moderate to severe mitral valve  regurgitation. No evidence of mitral stenosis. Moderate mitral annular   calcification. Unable to exclude degree of mitral valve stenosis, gradient  not measured.   6. Tricuspid valve regurgitation is mild to moderate.   7. The aortic valve is calcified. Aortic valve regurgitation is not  visualized. Aortic valve sclerosis/calcification is present, gradient not  measured. Visually there appears to be moderate valve stenosis.    2D echo 01/29/2024: 1. Left ventricular ejection fraction, by estimation, is 25 to 30%. The  left ventricle has severely decreased function. The left ventricle  demonstrates global hypokinesis with severe hypokinesis of teh basal to  mid anterior/anteroseptal wall (apical  region not well visualized). The left ventricular internal cavity size was  mildly dilated. Left ventricular diastolic parameters are indeterminate.   2. Right ventricular systolic function is normal. The right ventricular  size is normal. There is normal pulmonary artery systolic pressure. The  estimated right ventricular systolic pressure is 34.4 mmHg.   3. The mitral valve is normal in structure. Mild to moderate mitral valve  regurgitation. No evidence of mitral stenosis. Moderate mitral annular  calcification.   4. The aortic valve is normal in structure. There is moderate  calcification of the aortic valve. Aortic valve regurgitation is not  visualized. Aortic valve sclerosis/calcification is present, unable to  exclude stenosis (gradient not measured.   5. The inferior vena  cava is normal in size with greater than 50%  respiratory variability, suggesting right atrial pressure of 3 mmHg.    Patient Profile     83 y.o. 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   Assessment & Plan   Atrial tachycardia vs  atypical atrial flutter - presented on admission with rates in the 120s - Lopressor  25mg  BID - avoid CCB given cardiomyopathy - family favors a more conservative approach, we will continue loading amiodarone . No plan for TEE/DCCV at this time - she is on Eliquis  for underlying Afib - dig IV x 1 followed by oral dig 0.125mg  daily - soft pressures limiting rate control - amiodarone  400mg  BID - rates overall controlled   Persistent Afib - currently in Atrial tachycardia vs atypical aflutter with 2:1 conduction with rate of 120bpm - unclear onset - continue Eliquis  as above   HFrEF - echo showed LVEF 25-30%  - presumed to be secondary to Tokotsubo CM - cardiac cath deferred due to critical illness, age, and palliative care discussion - relative hypotension led to holding of GDMT - swelling is improving - repeat limited echo showed LVEF 30-35%, moderately enlarged RV, mod to severe MR, mild to mod TR - continue IV lasix  20mg  daily and monitor response   Acute hypoxic respiratory failure - resolved, now on room air   Elevated HS troponin - mildly elevated, suspect supply demand mismatch in the setting of atrial tachycardia and renal dysfunction - cardiac cath previously deferred  - no ASA given Eliquis     For questions or updates, please contact [facility] Please consult [email or web address] for contact info under       Signed, Shontavia Mickel VEAR Fishman, PA-C

## 2024-05-13 NOTE — Progress Notes (Addendum)
 Progress Note   Patient: Tanya Frye FMW:[id number] DOB: 1941-04-11 DOA: 05/09/2024     3 DOS: the patient was seen and examined on 05/13/2024   Brief hospital course: 83yo with h/o COPD, chronic HFrEF, HTN, DM, and afib on Eliquis  who presented on 8/17 with SOB. She was previously admitted in 01/2024 with CAP and cardiogenic shock requiring mechanical ventilation, pressors, and milrinone  drip. O2 sats at SNF were 70% on room air. She is DNR/DNI. HR 119 (sinus tach), CXR with vascular congestion and left-sided pleural effusion. She was given Duonebs, Lasix , and Solumedrol with concern for CHF exacerbation. O2 sats here not <93%, stable on RA.  Patient is seen by cardiology, treated with digoxin , beta-blocker and amiodarone  for atrial flutter.   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 Received IV Lasix , condition has improved.  Off oxygen.   Persistent atrial fibrillation with RVR. Atrial flutter. Continue telemetry monitor, continue anticoagulation with Eliquis . Patient was given digoxin  and a beta-blocker.  Patient still has some tachycardia, digoxin  increasing dose.  Will continue to follow for another day.   Stage 3b CKD Patient stable    Type 2 diabetes with hypoglycemia Still has some hypoglycemia this morning, I reduced insulin  glargine to 6 units daily, also changed sliding scale insulin  to lower dose, discontinued evening coverage.  Glucose now  running at 180, I will give additional dose of 6 units of insulin  glargine tonight.  I will adjust dose tomorrow morning again.   Hypothyroidism Continue Synthroid    HTN Continue beta-blocker.   COPD Continue DuoNebs No apparent exacerbation at this time   Elevated lactic acid Likely due to tachycardia and hypoxemia No concern for sepsis   Obesity with BMI 30.12. Diet and excise.  Stage 1 sacral pressure wound: POA     Subjective:  Patient feels better, no significant short of breath.  Physical Exam: Vitals:   05/13/24 0448 05/13/24 0548 05/13/24 0852 05/13/24 0938  BP: 96/66 105/68 102/68   Pulse: (!) 37 89 (!) 106   Resp: 19 18 20    Temp: 98.5 F (36.9 C)   98.4 F (36.9 C)  TempSrc:    Oral  SpO2: 97%  100%   Weight:      Height:       General exam: Appears calm and comfortable  Respiratory system: Clear to auscultation. Respiratory effort normal. Cardiovascular system: Irregular and tachycardic. No JVD, murmurs, rubs, gallops or clicks. No pedal edema. Gastrointestinal system: Abdomen is nondistended, soft and nontender. No organomegaly or masses felt. Normal bowel sounds heard. Central nervous system: Alert and oriented x3. No focal neurological deficits. Extremities: Symmetric 5 x 5 power. Skin: No rashes, lesions or ulcers Psychiatry: Judgement and insight appear normal. Mood & affect appropriate.    Data Reviewed:  Lab results reviewed.  Family Communication: None  Disposition: Status is: Inpatient Remains inpatient appropriate because: Severity of disease.     Time spent: 35 minutes  Author: Murvin Mana, MD 05/13/2024 1:04 PM  For  on call review [email or web address].

## 2024-05-13 NOTE — Plan of Care (Signed)

## 2024-05-13 NOTE — Progress Notes (Signed)
 Occupational Therapy Treatment Patient Details Name: Tanya Frye MRN: [id number] DOB: June 17, 1941 Today's Date: 05/13/2024   History of present illness 83 y.o. 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 Admitted for concern for CHF/COPD exacerbation   OT comments  Tanya Frye was seen for OT treatment on this date. Upon arrival to room pt seated in chair, agreeable to tx. Pt requires MIN A + RW sit<>stand and CGA + RW for ADL t/f ~20 ft. MOD A don socks in sitting. Pt making good progress toward goals, will continue to follow POC. Discharge recommendation remains appropriate.        If plan is discharge home, recommend the following:  A little help with walking and/or transfers;A little help with bathing/dressing/bathroom;Supervision due to cognitive status   Equipment Recommendations  BSC/3in1    Recommendations for Other Services      Precautions / Restrictions Precautions Precautions: Fall Recall of Precautions/Restrictions: Intact Restrictions Weight Bearing Restrictions Per Provider Order: No       Mobility Bed Mobility               General bed mobility comments: not tested    Transfers Overall transfer level: Needs assistance Equipment used: Rolling walker (2 wheels) Transfers: Sit to/from Stand Sit to Stand: Min assist                 Balance Overall balance assessment: Needs assistance Sitting-balance support: No upper extremity supported, Feet supported Sitting balance-Leahy Scale: Fair     Standing balance support: Bilateral upper extremity supported Standing balance-Leahy Scale: Fair                             ADL either performed or assessed with clinical judgement   ADL Overall ADL's : Needs assistance/impaired                                        General ADL Comments: MIN A + RW for ADL t/f ~20 ft. MOD A don socks in sitting    Extremity/Trunk Assessment              Vision       Perception     Praxis     Communication Communication Communication: Impaired Factors Affecting Communication: Hearing impaired;Reduced clarity of speech   Cognition Arousal: Alert Behavior During Therapy: WFL for tasks assessed/performed Cognition: No family/caregiver present to determine baseline             OT - Cognition Comments: garbled speech. recalls recent activities with nursing, garbled speech difficult and HOH difficult to assess                 Following commands: Intact        Cueing   Cueing Techniques: Verbal cues, Gestural cues  Exercises      Shoulder Instructions       General Comments      Pertinent Vitals/ Pain       Pain Assessment Pain Assessment: No/denies pain  Home Living  Prior Functioning/Environment              Frequency  Min 1X/week        Progress Toward Goals  OT Goals(current goals can now be found in the care plan section)  Progress towards OT goals: Progressing toward goals  Acute Rehab OT Goals OT Goal Formulation: With patient Time For Goal Achievement: 05/25/24 Potential to Achieve Goals: Good ADL Goals Pt Will Perform Grooming: with set-up;with supervision;standing Pt Will Perform Lower Body Dressing: with min assist;sit to/from stand Pt Will Transfer to Toilet: ambulating;regular height toilet;with supervision  Plan      Co-evaluation                 AM-PAC OT 6 Clicks Daily Activity     Outcome Measure   Help from another person eating meals?: None Help from another person taking care of personal grooming?: A Little Help from another person toileting, which includes using toliet, bedpan, or urinal?: A Lot Help from another person bathing (including washing,  rinsing, drying)?: A Lot Help from another person to put on and taking off regular upper body clothing?: A Little Help from another person to put on and taking off regular lower body clothing?: A Lot 6 Click Score: 16    End of Session Equipment Utilized During Treatment: Rolling walker (2 wheels);Gait belt  OT Visit Diagnosis: Other abnormalities of gait and mobility (R26.89);Muscle weakness (generalized) (M62.81)   Activity Tolerance Patient tolerated treatment well   Patient Left in chair;with call bell/phone within reach;with chair alarm set;with nursing/sitter in room   Nurse Communication Mobility status        Time: 8478-8467 OT Time Calculation (min): 11 min  Charges: OT General Charges $OT Visit: 1 Visit OT Treatments $Self Care/Home Management : 8-22 mins  Tanya Frye, M.S. OTR/L  05/13/24, 4:04 PM  ascom [phone]

## 2024-05-14 ENCOUNTER — Telehealth: Payer: Self-pay

## 2024-05-14 DIAGNOSIS — I10 Essential (primary) hypertension: Secondary | ICD-10-CM

## 2024-05-14 DIAGNOSIS — I5020 Unspecified systolic (congestive) heart failure: Secondary | ICD-10-CM

## 2024-05-14 DIAGNOSIS — J9601 Acute respiratory failure with hypoxia: Secondary | ICD-10-CM | POA: Diagnosis not present

## 2024-05-14 DIAGNOSIS — I4891 Unspecified atrial fibrillation: Secondary | ICD-10-CM | POA: Diagnosis not present

## 2024-05-14 DIAGNOSIS — Z79899 Other long term (current) drug therapy: Secondary | ICD-10-CM

## 2024-05-14 DIAGNOSIS — I5043 Acute on chronic combined systolic (congestive) and diastolic (congestive) heart failure: Secondary | ICD-10-CM | POA: Diagnosis not present

## 2024-05-14 DIAGNOSIS — R000 Tachycardia, unspecified: Secondary | ICD-10-CM | POA: Diagnosis not present

## 2024-05-14 LAB — CULTURE, BLOOD (ROUTINE X 2)
Culture: NO GROWTH
Culture: NO GROWTH
Special Requests: ADEQUATE

## 2024-05-14 LAB — BASIC METABOLIC PANEL WITH GFR
Anion gap: 8 (ref 5–15)
BUN: 18 mg/dL (ref 8–23)
CO2: 27 mmol/L (ref 22–32)
Calcium: 8.1 mg/dL — ABNORMAL LOW (ref 8.9–10.3)
Chloride: 102 mmol/L (ref 98–111)
Creatinine, Ser: 1.4 mg/dL — ABNORMAL HIGH (ref 0.44–1.00)
GFR, Estimated: 38 mL/min — ABNORMAL LOW (ref >60)
Glucose, Bld: 91 mg/dL (ref 70–99)
Potassium: 3.8 mmol/L (ref 3.5–5.1)
Sodium: 137 mmol/L (ref 135–145)

## 2024-05-14 LAB — GLUCOSE, CAPILLARY
Glucose-Capillary: 83 mg/dL (ref 70–99)
Glucose-Capillary: 157 mg/dL — ABNORMAL HIGH (ref 70–99)

## 2024-05-14 MED ORDER — AMIODARONE HCL 200 MG PO TABS: ORAL_TABLET | ORAL | Status: DC

## 2024-05-14 MED ORDER — AMIODARONE HCL 200 MG PO TABS: 200.0000 mg | ORAL_TABLET | Freq: Every day | ORAL | Status: DC

## 2024-05-14 MED ORDER — FUROSEMIDE 20 MG PO TABS: 20.0000 mg | ORAL_TABLET | Freq: Every day | ORAL | Status: DC

## 2024-05-14 MED ORDER — METOPROLOL TARTRATE 25 MG PO TABS: 25.0000 mg | ORAL_TABLET | Freq: Two times a day (BID) | ORAL | Status: AC

## 2024-05-14 MED ORDER — INSULIN GLARGINE-YFGN 100 UNIT/ML ~~LOC~~ SOLN: 8.0000 [IU] | Freq: Every day | SUBCUTANEOUS | Status: AC

## 2024-05-14 MED ORDER — DIGOXIN 125 MCG PO TABS
0.0625 mg | ORAL_TABLET | Freq: Every day | ORAL | Status: DC
  Administered 2024-05-14: 0.0625 mg via ORAL
  Filled 2024-05-14: qty 0.5

## 2024-05-14 MED ORDER — FUROSEMIDE 10 MG/ML IJ SOLN
20.0000 mg | Freq: Once | INTRAMUSCULAR | Status: AC
  Administered 2024-05-14: 20 mg via INTRAVENOUS
  Filled 2024-05-14: qty 2

## 2024-05-14 MED ORDER — AMIODARONE HCL 200 MG PO TABS
400.0000 mg | ORAL_TABLET | Freq: Two times a day (BID) | ORAL | Status: DC
  Administered 2024-05-14: 400 mg via ORAL
  Filled 2024-05-14: qty 2

## 2024-05-14 MED ORDER — DIGOXIN 62.5 MCG PO TABS: 0.0625 mg | ORAL_TABLET | Freq: Every day | ORAL | Status: DC

## 2024-05-14 NOTE — Telephone Encounter (Signed)
 Advised of needing labs on Monday and 1 month follow up once d/c from hospital - gave office phone number - stated she would call once a decisions was made on d/c and schedule for both   Orders for labs entered

## 2024-05-14 NOTE — Plan of Care (Signed)
  Problem: Education: Goal: Ability to describe self-care measures that may prevent or decrease complications (Diabetes Survival Skills Education) will improve Outcome: Progressing   Problem: Coping: Goal: Ability to adjust to condition or change in health will improve Outcome: Progressing   Problem: Health Behavior/Discharge Planning: Goal: Ability to identify and utilize available resources and services will improve Outcome: Progressing   Problem: Metabolic: Goal: Ability to maintain appropriate glucose levels will improve Outcome: Progressing   Problem: Nutritional: Goal: Maintenance of adequate nutrition will improve Outcome: Progressing   Problem: Skin Integrity: Goal: Risk for impaired skin integrity will decrease Outcome: Progressing   Problem: Education: Goal: Knowledge of General Education information will improve Description: Including pain rating scale, medication(s)/side effects and non-pharmacologic comfort measures Outcome: Progressing   Problem: Clinical Measurements: Goal: Cardiovascular complication will be avoided Outcome: Progressing

## 2024-05-14 NOTE — Discharge Summary (Signed)
 Physician Discharge Summary   Patient: Tanya Frye MRN: [id number] DOB: April 27, 1941  Admit date:     05/09/2024  Discharge date: 05/14/24  Discharge Physician: Murvin Mana   PCP: Pcp, No   Recommendations at discharge:   Follow-up with PCP in nursing home within 1 week. Follow-up with cardiology in 1 month.  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 [facility] Hospital Course: 83yo with h/o COPD, chronic HFrEF, HTN, DM, and afib on Eliquis  who presented on 8/17 with SOB. She was previously admitted in 01/2024 with CAP and cardiogenic shock requiring mechanical ventilation, pressors, and milrinone  drip. O2 sats at SNF were 70% on room air. She is DNR/DNI. HR 119 (sinus tach), CXR with vascular congestion and left-sided pleural effusion. She was given Duonebs, Lasix , and Solumedrol with concern for CHF exacerbation. O2 sats here not <93%, stable on RA.  Patient is seen by cardiology, treated with digoxin , beta-blocker and amiodarone  for atrial flutter. Heart rate much better, volume status improved.  Medically stable for discharge  Assessment and Plan:  Acute hypoxemic respiratory failure Acute on chronic HFrEF Troponin secondary to demand ischemia from congestive heart failure. Received IV Lasix , condition has improved.  Off oxygen. Continue lower dose diuretics, follow-up with cardiology as outpatient.   Persistent atrial fibrillation with RVR. Atrial flutter. Continue telemetry monitor, continue anticoagulation with  Eliquis . Patient was given digoxin  and a beta-blocker.  Heart rate much better controlled.  Continue amiodarone  and digoxin  and beta-blocker.   Stage 3b CKD Patient stable    Type 2 diabetes with hypoglycemia Reduced Lantus  to 8 units daily.   Hypothyroidism Continue Synthroid    HTN Continue beta-blocker.   COPD Continue DuoNebs No apparent exacerbation at this time   Elevated lactic acid Likely due to tachycardia and hypoxemia No concern for sepsis   Obesity with BMI 30.12. Diet and excise.   Stage 1 sacral pressure wound: POA Follow-up with RN in the nursing home     Consultants: Cardiology Procedures performed: None  Disposition: Skilled nursing facility Diet recommendation:  Discharge Diet Orders (From admission, onward)     Start     Ordered   05/14/24 0000  Diet general       Comments: Dys 3 diet, thin liquid   05/14/24 1031           Cardiac diet DISCHARGE MEDICATION: Allergies as of 05/14/2024       Reactions   Penicillins Rash        Medication List     STOP taking these medications    traMADol 50 MG tablet Commonly known as: ULTRAM       TAKE these medications    acetaminophen  650 MG CR tablet Commonly known as: TYLENOL  Take 650 mg by mouth every 8 (eight) hours as needed for pain.   amiodarone  200 MG tablet Commonly known as: PACERONE  Take 2 tablets (400 mg total) by mouth daily for 3 days, THEN 1 tablet (200 mg total) daily. Start taking on: May 14, 2024 What changed: See the new instructions.  apixaban  5 MG Tabs tablet Commonly known as: ELIQUIS  Take 1 tablet (5 mg total) by mouth 2 (two) times daily.   ascorbic acid  500 MG tablet Commonly known as: VITAMIN C  Take 500 mg by mouth daily.   calcium carbonate 1500 (600 Ca) MG Tabs tablet Commonly known as: OSCAL Take 1,500 mg by mouth in the morning and at bedtime.   Digoxin  62.5 MCG Tabs Take 0.0625 mg by mouth daily.   docusate sodium  100 MG  capsule Commonly known as: COLACE Take 100 mg by mouth 2 (two) times daily.   feeding supplement Liqd Take 237 mLs by mouth 2 (two) times daily between meals.   Fish Oil 500 MG Caps Take 1,000 mg by mouth daily.   fluticasone  50 MCG/ACT nasal spray Commonly known as: FLONASE  Place 1 spray into both nostrils daily.   furosemide  20 MG tablet Commonly known as: LASIX  Take 1 tablet (20 mg total) by mouth daily. Start taking on: May 15, 2024   insulin  glargine-yfgn 100 UNIT/ML injection Commonly known as: SEMGLEE  Inject 0.08 mLs (8 Units total) into the skin daily. What changed:  how much to take when to take this   levothyroxine  75 MCG tablet Commonly known as: SYNTHROID  Take 75 mcg by mouth daily before breakfast.   loratadine  10 MG tablet Commonly known as: CLARITIN  TAKE 1 TABLET BY MOUTH EVERY DAY   lovastatin 10 MG tablet Commonly known as: MEVACOR TAKE 1 TABLET ONCE A DAY ORALLY   metoprolol  tartrate 25 MG tablet Commonly known as: LOPRESSOR  Take 1 tablet (25 mg total) by mouth 2 (two) times daily.   multivitamin with minerals Tabs tablet Take 1 tablet by mouth daily.   nystatin powder Apply 1 Application topically 2 (two) times daily. Apply Nystatin powder to red area under right breast and place ABD pad between breast and chest twice daily   ondansetron  4 MG disintegrating tablet Commonly known as: ZOFRAN -ODT Take 4 mg by mouth every 8 (eight) hours as needed for nausea or vomiting.   phenol 1.4 % Liqd Commonly known as: CHLORASEPTIC Use as directed 1 spray in the mouth or throat every 2 (two) hours as needed for throat irritation / pain.   polyethylene glycol 17 g packet Commonly known as: MIRALAX  / GLYCOLAX  Take 17 g by mouth daily.   trolamine salicylate 10 % cream Commonly known as: ASPERCREME Apply 1 Application topically 3 (three) times daily. Apply to right knee three times a day.               Discharge Care Instructions  (From  admission, onward)           Start     Ordered   05/14/24 0000  Discharge wound care:       Comments: Follow with RN   05/14/24 1031            Contact information for follow-up providers     Gollan, Evalene PARAS, MD Follow up in 1 month(s).   Specialty: Cardiology Contact information: [address] [place] [address] [phone]              Contact information for after-discharge care     Destination     [facility] [place], [place]. [place]   Service: Skilled Nursing Contact information: [address] [place]   [address] [phone]                    Discharge Exam: Fredricka Weights  05/09/24 0443  Weight: 79.6 kg   General exam: Appears calm and comfortable  Respiratory system: Decreased breath sounds. Respiratory effort normal. Cardiovascular system: Irreegular. No JVD, murmurs, rubs, gallops or clicks. No pedal edema. Gastrointestinal system: Abdomen is nondistended, soft and nontender. No organomegaly or masses felt. Normal bowel sounds heard. Central nervous system: Alert and oriented. No focal neurological deficits. Extremities: Symmetric 5 x 5 power. Skin: No rashes, lesions or ulcers Psychiatry: Judgement and insight appear normal. Mood & affect appropriate.    Condition at discharge: fair  The results of significant diagnostics from this hospitalization (including imaging, microbiology, ancillary and laboratory) are listed below for reference.   Imaging Studies: ECHOCARDIOGRAM LIMITED Result Date: 05/12/2024    ECHOCARDIOGRAM LIMITED REPORT   Patient Name:   DENEKA GREENWALT Date of Exam: 05/12/2024 Medical Rec #:  [id number]    Height:       64.0 in Accession #:    [id number]   Weight:       175.5 lb Date of Birth:  Nov 29, 1940    BSA:          1.851 m Patient Age:    83 years     BP:           96/80 mmHg Patient Gender: F            HR:           80 bpm. Exam Location:  [facility] Procedure: Limited Color Doppler,  Limited Echo and Cardiac Doppler (Both            Spectral and Color Flow Doppler were utilized during procedure). Indications:     CHF-Acute Systolic I50.21  History:         Patient has prior history of Echocardiogram examinations, most                  recent 01/29/2024. CHF.  Sonographer:     Rosina Dunk Referring Phys:  [id number] EVALENE JINNY LUNGER Diagnosing Phys: EVALENE LUNGER MD IMPRESSIONS  1. Left ventricular ejection fraction, by estimation, is 30 to 35%. The left ventricle has moderately decreased function. The left ventricle demonstrates global hypokinesis. Left ventricular diastolic parameters are indeterminate.  2. Right ventricular systolic function is mildly reduced. The right ventricular size is moderately enlarged. Tricuspid regurgitation signal is inadequate for assessing PA pressure.  3. Left atrial size was severely dilated.  4. Moderate pleural effusion in the left lateral region.  5. The mitral valve is degenerative. Moderate to severe mitral valve regurgitation. No evidence of mitral stenosis. Moderate mitral annular calcification. Unable to exclude degree of mitral valve stenosis, gradient not measured.  6. Tricuspid valve regurgitation is mild to moderate.  7. The aortic valve is calcified. Aortic valve regurgitation is not visualized. Aortic valve sclerosis/calcification is present, gradient not measured. Visually there appears to be moderate valve stenosis. FINDINGS  Left Ventricle: Left ventricular ejection fraction, by estimation, is 30 to 35%. The left ventricle has moderately decreased function. The left ventricle demonstrates global hypokinesis. The left ventricular internal cavity size was normal in size. There is no left ventricular hypertrophy. Left ventricular diastolic parameters are indeterminate. Right Ventricle: The right ventricular size is moderately enlarged. No increase in right ventricular wall thickness. Right ventricular systolic function is mildly reduced.  Tricuspid regurgitation signal is inadequate for assessing PA pressure. Left Atrium: Left atrial size was severely dilated. Right Atrium: Right atrial size was normal in size. Pericardium: There is no evidence of pericardial effusion. Mitral  Valve: The mitral valve is degenerative in appearance. There is moderate calcification of the mitral valve leaflet(s). Moderate mitral annular calcification. Moderate to severe mitral valve regurgitation. No evidence of mitral valve stenosis. Tricuspid Valve: The tricuspid valve is normal in structure. Tricuspid valve regurgitation is mild to moderate. No evidence of tricuspid stenosis. Aortic Valve: The aortic valve is calcified. Aortic valve regurgitation is not visualized. Aortic valve sclerosis/calcification is present, without any evidence of aortic stenosis. Pulmonic Valve: The pulmonic valve was normal in structure. Pulmonic valve regurgitation is mild to moderate. No evidence of pulmonic stenosis. Aorta: The aortic root is normal in size and structure. Venous: The inferior vena cava was not well visualized. IAS/Shunts: No atrial level shunt detected by color flow Doppler. Additional Comments: There is a moderate pleural effusion in the left lateral region. Spectral Doppler performed. Color Doppler performed.   LV Volumes (MOD) LV vol d, MOD A2C: 60.5 ml LV vol d, MOD A4C: 57.6 ml LV vol s, MOD A2C: 38.7 ml LV vol s, MOD A4C: 28.8 ml LV SV MOD A2C:     21.8 ml LV SV MOD A4C:     57.6 ml LV SV MOD BP:      25.9 ml Evalene Lunger MD Electronically signed by Evalene Lunger MD Signature Date/Time: 05/12/2024/4:03:04 PM    Final    DG Chest Portable 1 View Result Date: 05/09/2024 EXAM: 1 VIEW XRAY OF THE CHEST 05/09/2024 04:31:00 AM COMPARISON: 1 view chest x-ray 02/06/2024. CLINICAL HISTORY: Respiratory distress, hypoxia. Respiratory distress, best images possible due to patient condition. FINDINGS: LUNGS AND PLEURA: Moderate pulmonary vascular congestion is present.  Asymmetric left pleural effusion and airspace opacity is present, improved from the prior exam. HEART AND MEDIASTINUM: Cardiomegaly is stable. BONES AND SOFT TISSUES: No acute osseous abnormality. IMPRESSION: 1. Asymmetric left pleural effusion and airspace opacity, improved from the prior exam. 2. Moderate pulmonary vascular congestion. 3. Stable cardiomegaly. Electronically signed by: Lonni Necessary MD 05/09/2024 04:44 AM EDT RP Workstation: [id number]    Microbiology: Results for orders placed or performed during the hospital encounter of 05/09/24  Resp panel by RT-PCR (RSV, Flu A&B, Covid) Anterior Nasal Swab     Status: None   Collection Time: 05/09/24  4:20 AM   Specimen: Anterior Nasal Swab  Result Value Ref Range Status   SARS Coronavirus 2 by RT PCR NEGATIVE NEGATIVE Final    Comment: (NOTE) SARS-CoV-2 target nucleic acids are NOT DETECTED.  The SARS-CoV-2 RNA is generally detectable in upper respiratory specimens during the acute phase of infection. The lowest concentration of SARS-CoV-2 viral copies this assay can detect is 138 copies/mL. A negative result does not preclude SARS-Cov-2 infection and should not be used as the sole basis for treatment or other patient management decisions. A negative result may occur with  improper specimen collection/handling, submission of specimen other than nasopharyngeal swab, presence of viral mutation(s) within the areas targeted by this assay, and inadequate number of viral copies(<138 copies/mL). A negative result must be combined with clinical observations, patient history, and epidemiological information. The expected result is Negative.  Fact Sheet for Patients:  BloggerCourse.com  Fact Sheet for Healthcare Providers:  SeriousBroker.it  This test is no t yet approved or cleared by the [place]  FDA and  has been authorized for detection and/or diagnosis of SARS-CoV-2  by FDA under an Emergency Use Authorization (EUA). This EUA will remain  in effect (meaning this test can be used) for the duration of the COVID-19 declaration under  Section 564(b)(1) of the Act, 21 U.S.C.section 360bbb-3(b)(1), unless the authorization is terminated  or revoked sooner.       Influenza A by PCR NEGATIVE NEGATIVE Final   Influenza B by PCR NEGATIVE NEGATIVE Final    Comment: (NOTE) The Xpert Xpress SARS-CoV-2/FLU/RSV plus assay is intended as an aid in the diagnosis of influenza from Nasopharyngeal swab specimens and should not be used as a sole basis for treatment. Nasal washings and aspirates are unacceptable for Xpert Xpress SARS-CoV-2/FLU/RSV testing.  Fact Sheet for Patients: BloggerCourse.com  Fact Sheet for Healthcare Providers: SeriousBroker.it  This test is not yet approved or cleared by the [place]  FDA and has been authorized for detection and/or diagnosis of SARS-CoV-2 by FDA under an Emergency Use Authorization (EUA). This EUA will remain in effect (meaning this test can be used) for the duration of the COVID-19 declaration under Section 564(b)(1) of the Act, 21 U.S.C. section 360bbb-3(b)(1), unless the authorization is terminated or revoked.     Resp Syncytial Virus by PCR NEGATIVE NEGATIVE Final    Comment: (NOTE) Fact Sheet for Patients: BloggerCourse.com  Fact Sheet for Healthcare Providers: SeriousBroker.it  This test is not yet approved or cleared by the [place]  FDA and has been authorized for detection and/or diagnosis of SARS-CoV-2 by FDA under an Emergency Use Authorization (EUA). This EUA will remain in effect (meaning this test can be used) for the duration of the COVID-19 declaration under Section 564(b)(1) of the Act, 21 U.S.C. section 360bbb-3(b)(1), unless the authorization is terminated or revoked.  Performed at  [facility], [address] [place], [place] [address]   Blood culture (routine x 2)     Status: None   Collection Time: 05/09/24  4:20 AM   Specimen: BLOOD  Result Value Ref Range Status   Specimen Description BLOOD BLOOD RIGHT FOREARM  Final   Special Requests   Final    BOTTLES DRAWN AEROBIC AND ANAEROBIC Blood Culture adequate volume   Culture   Final    NO GROWTH 5 DAYS Performed at [facility] [address] [place], [place] [address]    Report Status 05/14/2024 FINAL  Final  Blood culture (routine x 2)     Status: None   Collection Time: 05/09/24  4:20 AM   Specimen: BLOOD LEFT ARM  Result Value Ref Range Status   Specimen Description BLOOD LEFT ARM  Final   Special Requests   Final    BOTTLES DRAWN AEROBIC AND ANAEROBIC Blood Culture results may not be optimal due to an inadequate volume of blood received in culture bottles   Culture   Final    NO GROWTH 5 DAYS Performed at [facility], [address] [place], [place] [address]    Report Status 05/14/2024 FINAL  Final    Labs: CBC: Recent Labs  Lab 05/09/24 0420 05/10/24 0412 05/12/24 0325  WBC 13.2* 9.6 10.5  NEUTROABS 7.6  --  6.3  HGB 14.0 11.5* 11.8*  HCT 44.0 35.2* 37.2  MCV 91.5 89.3 92.5  PLT 384 277 264   Basic Metabolic Panel: Recent Labs  Lab 05/09/24 0618 05/10/24 0412 05/10/24 1019 05/12/24 0325 05/13/24 0347 05/14/24 0438  NA 141  --  136 137 138 137  K 3.4*  --  4.0 4.1 4.0 3.8  CL 103  --  101 103 103 102  CO2 23  --  22 26 28 27   GLUCOSE 164*  --  177* 102* 62* 91  BUN  12  --  15 20 20 18   CREATININE 1.42*  --  1.26* 1.31* 1.39* 1.40*  CALCIUM 9.2  --  8.1* 8.3* 8.2* 8.1*  MG  --  1.7  --   --  2.1  --    Liver Function Tests: Recent Labs  Lab 05/09/24 0420 05/09/24 0618  AST 21 24  ALT 12 12  ALKPHOS 37* 34*  BILITOT 0.8 0.8  PROT 6.6 6.2*  ALBUMIN 3.3* 3.0*   CBG: Recent Labs  Lab 05/13/24 0704 05/13/24 1156 05/13/24 1533  05/13/24 2135 05/14/24 0752  GLUCAP 79 185* 129* 130* 83    Discharge time spent: 35 minutes.  Signed: Murvin Mana, MD [facility] 05/14/2024

## 2024-05-14 NOTE — Progress Notes (Signed)
 Progress Note  Patient Name: Tanya Frye Date of Encounter: 05/14/2024 [facility] Cardiologist: Evalene Lunger, MD   Interval Summary    UOP - . Patient denies chest pain. Volume status is improving.   Tele seems to show Afib/flutter in the 60s. Will check an EKG.  Vital Signs Vitals:   05/14/24 0358 05/14/24 0426 05/14/24 0750 05/14/24 0750  BP: (!) 93/49 101/74 (!) 91/53 (!) 96/57  Pulse: 66  70 73  Resp: 18  18   Temp: 97.7 F (36.5 C)  97.8 F (36.6 C)   TempSrc: Oral  Oral   SpO2: 99%  95% 97%  Weight:      Height:        Intake/Output Summary (Last 24 hours) at 05/14/2024 0756 Last data filed at 05/14/2024 0400 Gross per 24 hour  Intake 490 ml  Output 950 ml  Net -460 ml      05/09/2024    4:43 AM 02/13/2024    5:00 AM 02/12/2024    5:00 AM  Last 3 Weights  Weight (lbs) 175 lb 8 oz 184 lb 15.5 oz 180 lb 8.9 oz  Weight (kg) 79.606 kg 83.9 kg 81.9 kg      Telemetry/ECG  ?slow Afib/flutter Hr 60-80s - Personally Reviewed  Physical Exam  GEN: No acute distress.   Neck: No JVD Cardiac: RRR, no murmurs, rubs, or gallops.  Respiratory: minimal wheezing GI: Soft, nontender, non-distended  MS: No edema   Cardiac Studies    Echo limited 05/12/24  1. Left ventricular ejection fraction, by estimation, is 30 to 35%. The  left ventricle has moderately decreased function. The left ventricle  demonstrates global hypokinesis. Left ventricular diastolic parameters are  indeterminate.   2. Right ventricular systolic function is mildly reduced. The right  ventricular size is moderately enlarged. Tricuspid regurgitation signal is  inadequate for assessing PA pressure.   3. Left atrial size was severely dilated.   4. Moderate pleural effusion in the left lateral region.   5. The mitral valve is degenerative. Moderate to severe mitral valve  regurgitation. No evidence of mitral stenosis. Moderate mitral annular  calcification. Unable to exclude degree  of mitral valve stenosis, gradient  not measured.   6. Tricuspid valve regurgitation is mild to moderate.   7. The aortic valve is calcified. Aortic valve regurgitation is not  visualized. Aortic valve sclerosis/calcification is present, gradient not  measured. Visually there appears to be moderate valve stenosis.      2D echo 01/29/2024: 1. Left ventricular ejection fraction, by estimation, is 25 to 30%. The  left ventricle has severely decreased function. The left ventricle  demonstrates global hypokinesis with severe hypokinesis of teh basal to  mid anterior/anteroseptal wall (apical  region not well visualized). The left ventricular internal cavity size was  mildly dilated. Left ventricular diastolic parameters are indeterminate.   2. Right ventricular systolic function is normal. The right ventricular  size is normal. There is normal pulmonary artery systolic pressure. The  estimated right ventricular systolic pressure is 34.4 mmHg.   3. The mitral valve is normal in structure. Mild to moderate mitral valve  regurgitation. No evidence of mitral stenosis. Moderate mitral annular  calcification.   4. The aortic valve is normal in structure. There is moderate  calcification of the aortic valve. Aortic valve regurgitation is not  visualized. Aortic valve sclerosis/calcification is present, unable to  exclude stenosis (gradient not measured.   5. The inferior vena cava is normal in size  with greater than 50%  respiratory variability, suggesting right atrial pressure of 3 mmHg.    Patient Profile     83 y.o. 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    Assessment & Plan   Atrial tachycardia vs atypical atrial flutter - presented on  admission with rates in the 120s - Lopressor  25mg  BID - soft pressures limiting rate control - dig IV x 1 followed by oral dig 0.125mg  daily - amiodarone  400mg  BID x 1 week, 200mg  BID x 1 week, then 200mg  thereafter - she is on Eliquis  for underlying Afib - rates overall controlled. Will check EKG - family favors a more conservative approach, we will continue loading amiodarone .    Persistent Afib - rates controlled - continue Eliquis  as above   HFrEF - prior echo showed LVEF 25-30% presumed to be secondary to Tokotsubo CM - repeat limited echo showed LVEF 30-35%, moderately enlarged RV, mod to severe MR, mild to mod TR - cardiac cath deferred due to critical illness, age, and palliative care discussion - relative hypotension led to holding of GDMT - IV lasix  20mg  daily  - Net -2.3L - kidney function stable. Continue with gentle diuresis   Acute hypoxic respiratory failure - resolved, now on room air   Elevated HS troponin - mildly elevated, suspect supply demand mismatch in the setting of atrial tachycardia and renal dysfunction - cardiac cath previously deferred  - no ASA given Eliquis      For questions or updates, please contact [facility] Please consult [email or web address] for contact info under       Signed, Kamyra Schroeck VEAR Fishman, PA-C

## 2024-05-14 NOTE — Progress Notes (Signed)
   05/14/24 0900  Spiritual Encounters  Type of Visit Initial  Care provided to: Pt and family (Daughter in Social worker at bedside)  Conversation partners present during encounter Nurse  Referral source Family  Reason for visit Routine spiritual support  OnCall Visit No  Spiritual Framework  Presenting Themes Meaning/purpose/sources of inspiration;Other (comment) (Daughter in law requested prayer for Pt)  Patient Stress Factors Other (Comment) (Pt is hard of hearing and did not bring her hearing aids b/c she was afraid of losing them.)  Family Stress Factors Loss;Other (Comment) (Family has had a lot of losses)  Interventions  Spiritual Care Interventions Made Prayer;Established relationship of care and support;Compassionate presence  Intervention Outcomes  Outcomes Connection to spiritual care;Awareness of support

## 2024-05-14 NOTE — TOC Transition Note (Signed)
 Transition of Care [facility]) - Discharge Note   Patient Details  Name: Tanya Frye MRN: [id number] Date of Birth: [birth year]  Transition of Care [facility] CM/SW Contact:  Lauraine JAYSON Carpen, LCSW Phone Number: 05/14/2024, 12:10 PM   Clinical Narrative:  Patient has orders to discharge back to [facility] today. RN will call report to [phone] (Room 701). [facility] has been arranged for 1:30. No further concerns. CSW signing off.   Final next level of care: Skilled Nursing Facility Barriers to Discharge: Barriers Resolved   Patient Goals and CMS Choice   CMS Medicare.gov Compare Post Acute Care list provided to:: Patient Represenative (must comment) (Emailed to daughter-in-law)        Discharge Placement   Existing PASRR number confirmed : 05/13/24          Patient chooses bed at: [facility] [place] Patient to be transferred to facility by: [facility] Name of family member notified: Jodie Wix Patient and family notified of of transfer: 05/14/24  Discharge Plan and Services Additional resources added to the After Visit Summary for       Post Acute Care Choice: Home Health                               Social Drivers of Health (SDOH) Interventions SDOH Screenings   Food Insecurity: Patient Unable To Answer (05/11/2024)  Housing: Unknown (05/11/2024)  Transportation Needs: Patient Unable To Answer (05/11/2024)  Utilities: Patient Unable To Answer (05/11/2024)  Alcohol Screen: Low Risk  (09/05/2021)  Depression (PHQ2-9): Low Risk  (09/05/2021)  Financial Resource Strain: Low Risk  (09/05/2021)  Physical Activity: Inactive (09/05/2021)  Social Connections: Unknown (05/11/2024)  Stress: No Stress Concern Present (09/05/2021)  Tobacco Use: Low Risk  (04/19/2024)   Received from [facility]     Readmission Risk Interventions     No data to display

## 2024-05-14 NOTE — Telephone Encounter (Signed)
-----   Message from [place] sent at 05/14/2024  9:23 AM EDT ----- Regarding: Follow-up labs Good morning,  This lady will likely be d/c'ed to SNF today.  Can you help arrange for her to get a BMP and digoxin  level on Monday in the medical mall?  Thanks.  [place]

## 2024-05-14 NOTE — Progress Notes (Signed)
 Pt alert and confused. Report called to [facility] LPN at [facility]. 3 IV's removed. IV sites WNL. Pt left unit in stretcher with transportation staff.

## 2024-05-17 NOTE — Telephone Encounter (Signed)
 Left voicemail message that I would fax over lab orders and to call back if any further questions or needs.

## 2024-05-17 NOTE — Telephone Encounter (Signed)
 Caller (Daughter-in-law - Jodie) stated patient is at [facility] and wants orders for lab work sent there.  Caller provided phone# [phone].

## 2024-05-17 NOTE — Telephone Encounter (Signed)
 Called and obtained fax # [phone]. Will fax lab orders to that number.   Orders have been faxed.

## 2024-05-18 NOTE — Telephone Encounter (Signed)
 All faxes failed. They gave me 3 different fax numbers and none would go through. Called and gave verbal orders to [place] for BMP & Digoxin  level ordered by Dr. Mady and to please fax results back to our office. She read back orders and had no further questions.

## 2024-05-31 ENCOUNTER — Ambulatory Visit: Admitting: Cardiology

## 2024-06-04 ENCOUNTER — Ambulatory Visit: Admitting: Medical

## 2024-06-14 ENCOUNTER — Encounter: Payer: Self-pay | Admitting: Cardiology

## 2024-06-14 ENCOUNTER — Ambulatory Visit (INDEPENDENT_AMBULATORY_CARE_PROVIDER_SITE_OTHER): Admitting: Cardiology

## 2024-06-14 VITALS — BP 126/52 | HR 62 | Ht 65.0 in | Wt 163.8 lb

## 2024-06-14 DIAGNOSIS — E11621 Type 2 diabetes mellitus with foot ulcer: Secondary | ICD-10-CM

## 2024-06-14 DIAGNOSIS — Z794 Long term (current) use of insulin: Secondary | ICD-10-CM

## 2024-06-14 DIAGNOSIS — L89156 Pressure-induced deep tissue damage of sacral region: Secondary | ICD-10-CM

## 2024-06-14 DIAGNOSIS — I10 Essential (primary) hypertension: Secondary | ICD-10-CM

## 2024-06-14 DIAGNOSIS — E039 Hypothyroidism, unspecified: Secondary | ICD-10-CM

## 2024-06-14 DIAGNOSIS — E559 Vitamin D deficiency, unspecified: Secondary | ICD-10-CM | POA: Insufficient documentation

## 2024-06-14 DIAGNOSIS — I509 Heart failure, unspecified: Secondary | ICD-10-CM

## 2024-06-14 DIAGNOSIS — M4005 Postural kyphosis, thoracolumbar region: Secondary | ICD-10-CM

## 2024-06-14 DIAGNOSIS — E782 Mixed hyperlipidemia: Secondary | ICD-10-CM

## 2024-06-14 DIAGNOSIS — J441 Chronic obstructive pulmonary disease with (acute) exacerbation: Secondary | ICD-10-CM

## 2024-06-14 DIAGNOSIS — L97509 Non-pressure chronic ulcer of other part of unspecified foot with unspecified severity: Secondary | ICD-10-CM

## 2024-06-14 MED ORDER — TRIAMCINOLONE ACETONIDE 0.5 % EX OINT: 1.0000 | TOPICAL_OINTMENT | Freq: Two times a day (BID) | CUTANEOUS | 0 refills | Status: AC

## 2024-06-14 NOTE — Progress Notes (Signed)
 New Patient Office Visit  Subjective   Patient ID: Tanya Frye, female    DOB: January 15, 1941  Age: 83 y.o. MRN: [id number]  CC:  Chief Complaint  Patient presents with   Establish Care    NPE.     HPI Tanya Frye presents to establish care Previous Primary Care provider/office:   she does not have additional concerns to discuss today.   Patient in office to establish care. Patient currently in a rehab facility after being hospitalized 05/09/24 to 05/14/24 for acute respiratory failure, atrial fibrillation with RVR and stage 1 sacral pressure ulcer. Patient was on a ventilator during hospitalization in May 2025, was discharged to rehab facility at that time. Patient was doing PT at rehab facility. Will be discharged from rehab facility next Tuesday. Will place an order for home health, PT.  Patient complaining of lower extremity skin changes. Has seen podiatry. They recommend compression hose. Will send in triamcinolone  cream.  Will continue same medications.     Outpatient Encounter Medications as of 06/14/2024  Medication Sig   acetaminophen  (TYLENOL ) 650 MG CR tablet Take 650 mg by mouth every 8 (eight) hours as needed for pain.   amiodarone  (PACERONE ) 200 MG tablet Take 2 tablets (400 mg total) by mouth daily for 3 days, THEN 1 tablet (200 mg total) daily.   apixaban  (ELIQUIS ) 5 MG TABS tablet Take 1 tablet (5 mg total) by mouth 2 (two) times daily.   ascorbic acid  (VITAMIN C ) 500 MG tablet Take 500 mg by mouth daily.   calcium carbonate (OSCAL) 1500 (600 Ca) MG TABS tablet Take 1,500 mg by mouth in the morning and at bedtime.   digoxin  62.5 MCG TABS Take 0.0625 mg by mouth daily.   docusate sodium  (COLACE) 100 MG capsule Take 100 mg by mouth 2 (two) times daily.   feeding supplement (ENSURE ENLIVE / ENSURE PLUS) LIQD Take 237 mLs by mouth 2 (two) times daily between meals.   fluticasone  (FLONASE ) 50 MCG/ACT nasal spray Place 1 spray into both nostrils daily.   furosemide  (LASIX )  20 MG tablet Take 1 tablet (20 mg total) by mouth daily.   insulin  glargine (SEMGLEE ) 100 UNIT/ML injection Inject 10 Units into the skin in the morning and at bedtime.   insulin  glargine-yfgn (SEMGLEE ) 100 UNIT/ML injection Inject 0.08 mLs (8 Units total) into the skin daily.   ipratropium (ATROVENT HFA) 17 MCG/ACT inhaler Inhale 2 puffs into the lungs as needed for wheezing.   levothyroxine  (SYNTHROID ) 75 MCG tablet Take 75 mcg by mouth daily before breakfast.   loratadine  (CLARITIN ) 10 MG tablet TAKE 1 TABLET BY MOUTH EVERY DAY   lovastatin (MEVACOR) 10 MG tablet TAKE 1 TABLET ONCE A DAY ORALLY   metoprolol  tartrate (LOPRESSOR ) 25 MG tablet Take 1 tablet (25 mg total) by mouth 2 (two) times daily.   Multiple Vitamin (MULTIVITAMIN WITH MINERALS) TABS tablet Take 1 tablet by mouth daily.   nystatin powder Apply 1 Application topically 2 (two) times daily. Apply Nystatin powder to red area under right breast and place ABD pad between breast and chest twice daily   Omega-3 Fatty Acids (FISH OIL) 500 MG CAPS Take 1,000 mg by mouth daily.   ondansetron  (ZOFRAN -ODT) 4 MG disintegrating tablet Take 4 mg by mouth every 8 (eight) hours as needed for nausea or vomiting.   phenol (CHLORASEPTIC) 1.4 % LIQD Use as directed 1 spray in the mouth or throat every 2 (two) hours as needed for throat irritation /  pain.   polyethylene glycol (MIRALAX  / GLYCOLAX ) 17 g packet Take 17 g by mouth daily.   traMADol (ULTRAM) 50 MG tablet Take 50 mg by mouth as needed for moderate pain (pain score 4-6).   triamcinolone  ointment (KENALOG ) 0.5 % Apply 1 Application topically 2 (two) times daily.   trolamine salicylate (ASPERCREME) 10 % cream Apply 1 Application topically 3 (three) times daily. Apply to right knee three times a day.   No facility-administered encounter medications on file as of 06/14/2024.    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   CHF (congestive heart failure)  (HCC)    Diabetes mellitus without complication (HCC)    HOH (hard of hearing)    extremely HOH   Hypertension    Morbid obesity (HCC) 02/24/2020   Palpitations    occasional related to meds/heart races    Past Surgical History:  Procedure Laterality Date   ABDOMINAL HYSTERECTOMY  1990   CATARACT EXTRACTION W/PHACO Right 12/20/2020   Procedure: CATARACT EXTRACTION PHACO AND INTRAOCULAR LENS PLACEMENT (IOC) RIGHT DIABETIC 8.13 01:17.6 10.5%;  Surgeon: Mittie Gaskin, MD;  Location: [facility];  Service: Ophthalmology;  Laterality: Right;   CATARACT EXTRACTION W/PHACO Left 01/03/2021   Procedure: CATARACT EXTRACTION PHACO AND INTRAOCULAR LENS PLACEMENT (IOC) LEFT DIABETIC  7.37 01:15.6 9.8%;  Surgeon: Mittie Gaskin, MD;  Location: [facility];  Service: Ophthalmology;  Laterality: Left;   CHOLECYSTECTOMY     DILATION AND CURETTAGE OF UTERUS  before 1990   X2   NECK SURGERY  03/30/2008   Per family, pt broke her neck   STOMACH SURGERY     tumor removed, per family    Family History  Problem Relation Age of Onset   Asthma Mother    Kidney disease Father     Social History   Socioeconomic History   Marital status: Widowed    Spouse name: Not on file   Number of children: 1   Years of education: Not on file   Highest education level: 10th grade  Occupational History   Not on file  Tobacco Use   Smoking status: Never   Smokeless tobacco: Never  Vaping Use   Vaping status: Not on file  Substance and Sexual Activity   Alcohol use: Never   Drug use: Never   Sexual activity: Not Currently  Other Topics Concern   Not on file  Social History Narrative   Not on file   Social Drivers of Health   Financial Resource Strain: Low Risk  (09/05/2021)   Overall Financial Resource Strain (CARDIA)    Difficulty of Paying Living Expenses: Not hard at all  Food Insecurity: Patient Unable To Answer (05/11/2024)   Hunger Vital Sign    Worried About  Running Out of Food in the Last Year: Patient unable to answer    Ran Out of Food in the Last Year: Patient unable to answer  Transportation Needs: Patient Unable To Answer (05/11/2024)   PRAPARE - Transportation    Lack of Transportation (Medical): Patient unable to answer    Lack of Transportation (Non-Medical): Patient unable to answer  Physical Activity: Inactive (09/05/2021)   Exercise Vital Sign    Days of Exercise per Week: 0 days    Minutes of Exercise per Session: 0 min  Stress: No Stress Concern Present (09/05/2021)   [employer] of Occupational Health - Occupational Stress Questionnaire    Feeling of Stress :  Not at all  Social Connections: Unknown (05/11/2024)   Social Connection and Isolation Panel    Frequency of Communication with Friends and Family: Patient unable to answer    Frequency of Social Gatherings with Friends and Family: Patient unable to answer    Attends Religious Services: Not on file    Active Member of Clubs or Organizations: Patient unable to answer    Attends Banker Meetings: Patient unable to answer    Marital Status: Patient unable to answer  Intimate Partner Violence: Unknown (05/11/2024)   Humiliation, Afraid, Rape, and Kick questionnaire    Fear of Current or Ex-Partner: Patient unable to answer    Emotionally Abused: Patient unable to answer    Physically Abused: Not on file    Sexually Abused: Patient unable to answer    Review of Systems  Constitutional: Negative.   HENT: Negative.    Eyes: Negative.   Respiratory: Negative.  Negative for shortness of breath.   Cardiovascular: Negative.  Negative for chest pain.  Gastrointestinal: Negative.  Negative for abdominal pain, constipation and diarrhea.  Genitourinary: Negative.   Musculoskeletal:  Negative for joint pain and myalgias.  Skin: Negative.   Neurological: Negative.  Negative for dizziness and headaches.  Endo/Heme/Allergies: Negative.   All other systems  reviewed and are negative.       Objective   BP (!) 126/52   Pulse 62   Ht 5' 5 (1.651 m)   Wt 163 lb 12.8 oz (74.3 kg)   SpO2 95%   BMI 27.26 kg/m   Physical Exam Vitals and nursing note reviewed.  Constitutional:      Appearance: Normal appearance. She is normal weight.  HENT:     Head: Normocephalic and atraumatic.     Nose: Nose normal.     Mouth/Throat:     Mouth: Mucous membranes are moist.  Eyes:     Extraocular Movements: Extraocular movements intact.     Conjunctiva/sclera: Conjunctivae normal.     Pupils: Pupils are equal, round, and reactive to light.  Cardiovascular:     Rate and Rhythm: Normal rate and regular rhythm.     Pulses: Normal pulses.     Heart sounds: Murmur heard.  Pulmonary:     Effort: Pulmonary effort is normal.     Breath sounds: Normal breath sounds.  Abdominal:     General: Abdomen is flat. Bowel sounds are normal.     Palpations: Abdomen is soft.  Musculoskeletal:        General: Normal range of motion.     Cervical back: Normal range of motion.  Skin:    General: Skin is warm and dry.  Neurological:     General: No focal deficit present.     Mental Status: She is alert and oriented to person, place, and time.  Psychiatric:        Mood and Affect: Mood normal.        Behavior: Behavior normal.        Thought Content: Thought content normal.        Judgment: Judgment normal.        Assessment & Plan:  Triamcinolone  cream Home health, PT  Problem List Items Addressed This Visit       Cardiovascular and Mediastinum   Essential hypertension - Primary   CHF exacerbation (HCC)   Relevant Orders   Ambulatory referral to Home Health     Respiratory   COPD exacerbation (HCC)   Relevant Medications  ipratropium (ATROVENT HFA) 17 MCG/ACT inhaler   Other Relevant Orders   Ambulatory referral to Home Health     Endocrine   Hypothyroidism   Type 2 diabetes mellitus (HCC)   Relevant Medications   insulin  glargine  (SEMGLEE ) 100 UNIT/ML injection     Musculoskeletal and Integument   Postural kyphosis of thoracolumbar region   Relevant Orders   Ambulatory referral to Home Health     Other   Pressure injury of skin   Relevant Orders   Ambulatory referral to Home Health   Hyperlipidemia, unspecified   Vitamin D  deficiency    Return in about 3 months (around 09/13/2024) for fasting lab work prior.   Total time spent: 25 minutes  [employer], NP  06/14/2024   This document may have been prepared by Dragon Voice Recognition software and as such may include unintentional dictation errors.

## 2024-06-24 ENCOUNTER — Ambulatory Visit: Admitting: Cardiology

## 2024-06-25 ENCOUNTER — Ambulatory Visit: Admitting: Medical

## 2024-06-27 ENCOUNTER — Observation Stay
Admission: EM | Admit: 2024-06-27 | Discharge: 2024-07-02 | Disposition: A | Discharge status: Home / Self Care | Attending: Internal Medicine | Admitting: Internal Medicine

## 2024-06-27 ENCOUNTER — Emergency Department

## 2024-06-27 ENCOUNTER — Other Ambulatory Visit: Payer: Self-pay

## 2024-06-27 DIAGNOSIS — I4819 Other persistent atrial fibrillation: Secondary | ICD-10-CM | POA: Diagnosis not present

## 2024-06-27 DIAGNOSIS — R5381 Other malaise: Secondary | ICD-10-CM | POA: Diagnosis not present

## 2024-06-27 DIAGNOSIS — N390 Urinary tract infection, site not specified: Secondary | ICD-10-CM | POA: Diagnosis not present

## 2024-06-27 DIAGNOSIS — N1831 Chronic kidney disease, stage 3a: Secondary | ICD-10-CM | POA: Diagnosis not present

## 2024-06-27 DIAGNOSIS — Z79899 Other long term (current) drug therapy: Secondary | ICD-10-CM | POA: Diagnosis not present

## 2024-06-27 DIAGNOSIS — I130 Hypertensive heart and chronic kidney disease with heart failure and stage 1 through stage 4 chronic kidney disease, or unspecified chronic kidney disease: Secondary | ICD-10-CM | POA: Insufficient documentation

## 2024-06-27 DIAGNOSIS — E1122 Type 2 diabetes mellitus with diabetic chronic kidney disease: Secondary | ICD-10-CM | POA: Diagnosis not present

## 2024-06-27 DIAGNOSIS — I495 Sick sinus syndrome: Secondary | ICD-10-CM | POA: Diagnosis not present

## 2024-06-27 DIAGNOSIS — E039 Hypothyroidism, unspecified: Secondary | ICD-10-CM | POA: Insufficient documentation

## 2024-06-27 DIAGNOSIS — J449 Chronic obstructive pulmonary disease, unspecified: Secondary | ICD-10-CM | POA: Insufficient documentation

## 2024-06-27 DIAGNOSIS — R197 Diarrhea, unspecified: Secondary | ICD-10-CM | POA: Diagnosis not present

## 2024-06-27 DIAGNOSIS — I10 Essential (primary) hypertension: Secondary | ICD-10-CM | POA: Diagnosis present

## 2024-06-27 DIAGNOSIS — I480 Paroxysmal atrial fibrillation: Secondary | ICD-10-CM

## 2024-06-27 DIAGNOSIS — I5022 Chronic systolic (congestive) heart failure: Secondary | ICD-10-CM | POA: Insufficient documentation

## 2024-06-27 DIAGNOSIS — Z7901 Long term (current) use of anticoagulants: Secondary | ICD-10-CM | POA: Diagnosis not present

## 2024-06-27 DIAGNOSIS — R4182 Altered mental status, unspecified: Principal | ICD-10-CM

## 2024-06-27 DIAGNOSIS — N183 Chronic kidney disease, stage 3 unspecified: Secondary | ICD-10-CM

## 2024-06-27 DIAGNOSIS — E876 Hypokalemia: Secondary | ICD-10-CM | POA: Diagnosis not present

## 2024-06-27 DIAGNOSIS — E119 Type 2 diabetes mellitus without complications: Secondary | ICD-10-CM

## 2024-06-27 LAB — URINE DRUG SCREEN, QUALITATIVE (ARMC ONLY)
Amphetamines, Ur Screen: NOT DETECTED
Barbiturates, Ur Screen: NOT DETECTED
Benzodiazepine, Ur Scrn: POSITIVE — AB
Cannabinoid 50 Ng, Ur ~~LOC~~: NOT DETECTED
Cocaine Metabolite,Ur ~~LOC~~: NOT DETECTED
MDMA (Ecstasy)Ur Screen: NOT DETECTED
Methadone Scn, Ur: NOT DETECTED
Opiate, Ur Screen: NOT DETECTED
Phencyclidine (PCP) Ur S: NOT DETECTED
Tricyclic, Ur Screen: NOT DETECTED

## 2024-06-27 LAB — URINALYSIS, ROUTINE W REFLEX MICROSCOPIC
Bilirubin Urine: NEGATIVE
Glucose, UA: NEGATIVE mg/dL
Ketones, ur: 20 mg/dL — AB
Nitrite: POSITIVE — AB
Protein, ur: 100 mg/dL — AB
Specific Gravity, Urine: 1.005 (ref 1.005–1.030)
WBC, UA: >50 WBC/hpf (ref 0–5)
pH: 5 (ref 5.0–8.0)

## 2024-06-27 LAB — CBC WITH DIFFERENTIAL/PLATELET
Abs Immature Granulocytes: 0.02 K/uL (ref 0.00–0.07)
Basophils Absolute: 0.1 K/uL (ref 0.0–0.1)
Basophils Relative: 1 %
Eosinophils Absolute: 0.1 K/uL (ref 0.0–0.5)
Eosinophils Relative: 1 %
HCT: 38 % (ref 36.0–46.0)
Hemoglobin: 12.3 g/dL (ref 12.0–15.0)
Immature Granulocytes: 0 %
Lymphocytes Relative: 10 %
Lymphs Abs: 0.8 K/uL (ref 0.7–4.0)
MCH: 28.5 pg (ref 26.0–34.0)
MCHC: 32.4 g/dL (ref 30.0–36.0)
MCV: 88.2 fL (ref 80.0–100.0)
Monocytes Absolute: 0.5 K/uL (ref 0.1–1.0)
Monocytes Relative: 6 %
Neutro Abs: 7 K/uL (ref 1.7–7.7)
Neutrophils Relative %: 82 %
Platelets: 260 K/uL (ref 150–400)
RBC: 4.31 MIL/uL (ref 3.87–5.11)
RDW: 14.3 % (ref 11.5–15.5)
WBC: 8.6 K/uL (ref 4.0–10.5)
nRBC: 0 % (ref 0.0–0.2)

## 2024-06-27 LAB — DIGOXIN LEVEL: Digoxin Level: <0.6 ng/mL — ABNORMAL LOW (ref 0.8–2.0)

## 2024-06-27 LAB — COMPREHENSIVE METABOLIC PANEL WITH GFR
ALT: 18 U/L (ref 0–44)
AST: 29 U/L (ref 15–41)
Albumin: 3 g/dL — ABNORMAL LOW (ref 3.5–5.0)
Alkaline Phosphatase: 39 U/L (ref 38–126)
Anion gap: 22 — ABNORMAL HIGH (ref 5–15)
BUN: 13 mg/dL (ref 8–23)
CO2: 19 mmol/L — ABNORMAL LOW (ref 22–32)
Calcium: 8.3 mg/dL — ABNORMAL LOW (ref 8.9–10.3)
Chloride: 93 mmol/L — ABNORMAL LOW (ref 98–111)
Creatinine, Ser: 1.16 mg/dL — ABNORMAL HIGH (ref 0.44–1.00)
GFR, Estimated: 47 mL/min — ABNORMAL LOW (ref >60)
Glucose, Bld: 128 mg/dL — ABNORMAL HIGH (ref 70–99)
Potassium: 2.6 mmol/L — CL (ref 3.5–5.1)
Sodium: 134 mmol/L — ABNORMAL LOW (ref 135–145)
Total Bilirubin: 1.7 mg/dL — ABNORMAL HIGH (ref 0.0–1.2)
Total Protein: 6.4 g/dL — ABNORMAL LOW (ref 6.5–8.1)

## 2024-06-27 LAB — PHOSPHORUS: Phosphorus: 3.3 mg/dL (ref 2.5–4.6)

## 2024-06-27 LAB — BRAIN NATRIURETIC PEPTIDE: B Natriuretic Peptide: 162.7 pg/mL — ABNORMAL HIGH (ref 0.0–100.0)

## 2024-06-27 LAB — TSH
TSH: 1.724 u[IU]/mL (ref 0.350–4.500)
TSH: 1.731 u[IU]/mL (ref 0.350–4.500)

## 2024-06-27 LAB — PROTIME-INR
INR: 1 (ref 0.8–1.2)
Prothrombin Time: 14 s (ref 11.4–15.2)

## 2024-06-27 LAB — ETHANOL: Alcohol, Ethyl (B): <15 mg/dL (ref <15)

## 2024-06-27 LAB — BLOOD GAS, VENOUS
Acid-Base Excess: 1.2 mmol/L (ref 0.0–2.0)
Bicarbonate: 26.6 mmol/L (ref 20.0–28.0)
O2 Saturation: 42.8 %
Patient temperature: 37
pCO2, Ven: 44 mmHg (ref 44–60)
pH, Ven: 7.39 (ref 7.25–7.43)

## 2024-06-27 LAB — MAGNESIUM: Magnesium: 1.6 mg/dL — ABNORMAL LOW (ref 1.7–2.4)

## 2024-06-27 LAB — GLUCOSE, CAPILLARY
Glucose-Capillary: 102 mg/dL — ABNORMAL HIGH (ref 70–99)
Glucose-Capillary: 131 mg/dL — ABNORMAL HIGH (ref 70–99)

## 2024-06-27 LAB — TROPONIN I (HIGH SENSITIVITY)
Troponin I (High Sensitivity): 14 ng/L (ref <18)
Troponin I (High Sensitivity): 16 ng/L (ref <18)

## 2024-06-27 LAB — T4, FREE: Free T4: 2.07 ng/dL — ABNORMAL HIGH (ref 0.61–1.12)

## 2024-06-27 LAB — APTT: aPTT: <22 s — ABNORMAL LOW (ref 24–36)

## 2024-06-27 LAB — POTASSIUM: Potassium: 3.3 mmol/L — ABNORMAL LOW (ref 3.5–5.1)

## 2024-06-27 LAB — ACETAMINOPHEN LEVEL: Acetaminophen (Tylenol), Serum: <10 ug/mL — ABNORMAL LOW (ref 10–30)

## 2024-06-27 LAB — SALICYLATE LEVEL: Salicylate Lvl: <7 mg/dL — ABNORMAL LOW (ref 7.0–30.0)

## 2024-06-27 LAB — CK: Total CK: 116 U/L (ref 38–234)

## 2024-06-27 MED ORDER — POTASSIUM CHLORIDE 10 MEQ/100ML IV SOLN
10.0000 meq | INTRAVENOUS | Status: AC
  Administered 2024-06-27 (×2): 10 meq via INTRAVENOUS
  Filled 2024-06-27 (×2): qty 100

## 2024-06-27 MED ORDER — INSULIN ASPART 100 UNIT/ML IJ SOLN
0.0000 [IU] | Freq: Three times a day (TID) | INTRAMUSCULAR | Status: DC
  Administered 2024-06-28: 1 [IU] via SUBCUTANEOUS
  Administered 2024-06-28 – 2024-07-02 (×3): 2 [IU] via SUBCUTANEOUS
  Filled 2024-06-27 (×4): qty 1

## 2024-06-27 MED ORDER — POLYETHYLENE GLYCOL 3350 17 G PO PACK: 17.0000 g | PACK | Freq: Every day | ORAL | Status: DC | PRN

## 2024-06-27 MED ORDER — TRAMADOL HCL 50 MG PO TABS: 50.0000 mg | ORAL_TABLET | Freq: Every day | ORAL | Status: DC | PRN

## 2024-06-27 MED ORDER — APIXABAN 5 MG PO TABS
5.0000 mg | ORAL_TABLET | Freq: Two times a day (BID) | ORAL | Status: DC
  Administered 2024-06-27 – 2024-07-02 (×8): 5 mg via ORAL
  Filled 2024-06-27 (×9): qty 1

## 2024-06-27 MED ORDER — ONDANSETRON 4 MG PO TBDP: 4.0000 mg | ORAL_TABLET | Freq: Three times a day (TID) | ORAL | Status: DC | PRN

## 2024-06-27 MED ORDER — SODIUM CHLORIDE 0.9 % IV SOLN
2.0000 g | INTRAVENOUS | Status: DC
  Administered 2024-06-27 – 2024-06-28 (×2): 2 g via INTRAVENOUS
  Filled 2024-06-27 (×3): qty 20

## 2024-06-27 MED ORDER — DIGOXIN 125 MCG PO TABS
0.0625 mg | ORAL_TABLET | Freq: Every day | ORAL | Status: DC
Start: 2024-06-27 — End: 2024-06-29
  Administered 2024-06-28 – 2024-06-29 (×2): 0.0625 mg via ORAL
  Filled 2024-06-27 (×3): qty 0.5

## 2024-06-27 MED ORDER — AMIODARONE HCL 200 MG PO TABS
200.0000 mg | ORAL_TABLET | Freq: Every day | ORAL | Status: DC
Start: 2024-06-27 — End: 2024-06-29
  Administered 2024-06-27 – 2024-06-29 (×3): 200 mg via ORAL
  Filled 2024-06-27 (×3): qty 1

## 2024-06-27 MED ORDER — LORATADINE 10 MG PO TABS: 10.0000 mg | ORAL_TABLET | Freq: Every day | ORAL | Status: DC

## 2024-06-27 MED ORDER — SODIUM CHLORIDE 0.9 % IV SOLN: 2.0000 g | Freq: Once | INTRAVENOUS | Status: DC

## 2024-06-27 MED ORDER — POTASSIUM CHLORIDE CRYS ER 20 MEQ PO TBCR
40.0000 meq | EXTENDED_RELEASE_TABLET | ORAL | Status: AC
Start: 2024-06-27 — End: 2024-06-27
  Administered 2024-06-27 (×2): 40 meq via ORAL
  Filled 2024-06-27 (×2): qty 2

## 2024-06-27 MED ORDER — MAGNESIUM SULFATE 2 GM/50ML IV SOLN
2.0000 g | Freq: Once | INTRAVENOUS | Status: AC
  Administered 2024-06-27: 2 g via INTRAVENOUS
  Filled 2024-06-27: qty 50

## 2024-06-27 MED ORDER — FLUTICASONE PROPIONATE 50 MCG/ACT NA SUSP: 1.0000 | Freq: Every day | NASAL | Status: DC | PRN

## 2024-06-27 MED ORDER — LEVOTHYROXINE SODIUM 50 MCG PO TABS
75.0000 ug | ORAL_TABLET | Freq: Every day | ORAL | Status: DC
  Administered 2024-06-28 – 2024-07-01 (×4): 75 ug via ORAL
  Filled 2024-06-27 (×4): qty 2

## 2024-06-27 MED ORDER — SODIUM CHLORIDE 0.9 % IV BOLUS: 500.0000 mL | Freq: Once | INTRAVENOUS | Status: DC

## 2024-06-27 MED ORDER — IPRATROPIUM BROMIDE HFA 17 MCG/ACT IN AERS: 2.0000 | INHALATION_SPRAY | RESPIRATORY_TRACT | Status: DC | PRN

## 2024-06-27 MED ORDER — ACETAMINOPHEN 325 MG PO TABS: 650.0000 mg | ORAL_TABLET | Freq: Three times a day (TID) | ORAL | Status: DC | PRN

## 2024-06-27 MED ORDER — METOPROLOL TARTRATE 25 MG PO TABS
25.0000 mg | ORAL_TABLET | Freq: Two times a day (BID) | ORAL | Status: DC
Start: 2024-06-27 — End: 2024-06-28
  Administered 2024-06-27 – 2024-06-28 (×2): 25 mg via ORAL
  Filled 2024-06-27 (×2): qty 1

## 2024-06-27 MED ORDER — PRAVASTATIN SODIUM 20 MG PO TABS
10.0000 mg | ORAL_TABLET | Freq: Every day | ORAL | Status: DC
  Administered 2024-06-27 – 2024-06-29 (×3): 10 mg via ORAL
  Filled 2024-06-27 (×4): qty 1
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
## 2024-06-27 NOTE — ED Notes (Signed)
 Advised nurse that patient has ready bed

## 2024-06-27 NOTE — ED Notes (Signed)
 This RN attempted assess pt; pt unable to answer questions. Pt rambles on about grandson and son; this RN unable to redirect pt.
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
## 2024-06-27 NOTE — ED Triage Notes (Signed)
 Pt brought in by EMS from home for AMS/confusion. Per family, pt kicked them out of the house the other day. Family cleaned the house when they left. Today the house is trashed and pt is accusing family of moving things so she cannot find them.

## 2024-06-28 ENCOUNTER — Telehealth: Payer: Self-pay

## 2024-06-28 DIAGNOSIS — I10 Essential (primary) hypertension: Secondary | ICD-10-CM

## 2024-06-28 DIAGNOSIS — N390 Urinary tract infection, site not specified: Secondary | ICD-10-CM | POA: Diagnosis not present

## 2024-06-28 DIAGNOSIS — E039 Hypothyroidism, unspecified: Secondary | ICD-10-CM

## 2024-06-28 DIAGNOSIS — I480 Paroxysmal atrial fibrillation: Secondary | ICD-10-CM

## 2024-06-28 DIAGNOSIS — N183 Chronic kidney disease, stage 3 unspecified: Secondary | ICD-10-CM

## 2024-06-28 DIAGNOSIS — E876 Hypokalemia: Secondary | ICD-10-CM | POA: Diagnosis not present

## 2024-06-28 DIAGNOSIS — I5022 Chronic systolic (congestive) heart failure: Secondary | ICD-10-CM

## 2024-06-28 DIAGNOSIS — N1831 Chronic kidney disease, stage 3a: Secondary | ICD-10-CM

## 2024-06-28 DIAGNOSIS — R5381 Other malaise: Secondary | ICD-10-CM

## 2024-06-28 DIAGNOSIS — R4182 Altered mental status, unspecified: Secondary | ICD-10-CM

## 2024-06-28 DIAGNOSIS — R197 Diarrhea, unspecified: Secondary | ICD-10-CM

## 2024-06-28 LAB — GLUCOSE, CAPILLARY
Glucose-Capillary: 106 mg/dL — ABNORMAL HIGH (ref 70–99)
Glucose-Capillary: 174 mg/dL — ABNORMAL HIGH (ref 70–99)
Glucose-Capillary: 136 mg/dL — ABNORMAL HIGH (ref 70–99)
Glucose-Capillary: 147 mg/dL — ABNORMAL HIGH (ref 70–99)

## 2024-06-28 LAB — BASIC METABOLIC PANEL WITH GFR
Anion gap: 11 (ref 5–15)
BUN: 12 mg/dL (ref 8–23)
CO2: 23 mmol/L (ref 22–32)
Calcium: 7.7 mg/dL — ABNORMAL LOW (ref 8.9–10.3)
Chloride: 102 mmol/L (ref 98–111)
Creatinine, Ser: 1.09 mg/dL — ABNORMAL HIGH (ref 0.44–1.00)
GFR, Estimated: 51 mL/min — ABNORMAL LOW (ref >60)
Glucose, Bld: 96 mg/dL (ref 70–99)
Potassium: 3.5 mmol/L (ref 3.5–5.1)
Sodium: 136 mmol/L (ref 135–145)

## 2024-06-28 LAB — CBC
HCT: 31.8 % — ABNORMAL LOW (ref 36.0–46.0)
Hemoglobin: 10.8 g/dL — ABNORMAL LOW (ref 12.0–15.0)
MCH: 29.7 pg (ref 26.0–34.0)
MCHC: 34 g/dL (ref 30.0–36.0)
MCV: 87.4 fL (ref 80.0–100.0)
Platelets: 276 K/uL (ref 150–400)
RBC: 3.64 MIL/uL — ABNORMAL LOW (ref 3.87–5.11)
RDW: 14.5 % (ref 11.5–15.5)
WBC: 8.5 K/uL (ref 4.0–10.5)
nRBC: 0 % (ref 0.0–0.2)

## 2024-06-28 LAB — MAGNESIUM: Magnesium: 1.8 mg/dL (ref 1.7–2.4)

## 2024-06-28 MED ORDER — ADULT MULTIVITAMIN W/MINERALS CH
1.0000 | ORAL_TABLET | Freq: Every day | ORAL | Status: DC
  Administered 2024-06-28 – 2024-06-30 (×2): 1 via ORAL
  Filled 2024-06-28 (×3): qty 1

## 2024-06-28 MED ORDER — ENSURE ENLIVE PO LIQD
237.0000 mL | Freq: Two times a day (BID) | ORAL | Status: DC
  Administered 2024-06-28 – 2024-06-30 (×3): 237 mL via ORAL
  Filled 2024-06-28: qty 237

## 2024-06-28 MED ORDER — CALCIUM CARBONATE 1250 (500 CA) MG PO TABS
1000.0000 mg | ORAL_TABLET | Freq: Every day | ORAL | Status: DC
  Administered 2024-06-28 – 2024-06-29 (×2): 2500 mg via ORAL
  Filled 2024-06-28 (×3): qty 2

## 2024-06-28 MED ORDER — POTASSIUM CHLORIDE 20 MEQ PO PACK
40.0000 meq | PACK | Freq: Once | ORAL | Status: AC
  Administered 2024-06-28: 40 meq via ORAL
  Filled 2024-06-28: qty 2

## 2024-06-28 MED ORDER — INSULIN GLARGINE 100 UNIT/ML ~~LOC~~ SOLN
5.0000 [IU] | Freq: Every day | SUBCUTANEOUS | Status: DC
Start: 2024-06-28 — End: 2024-07-02
  Administered 2024-06-28 – 2024-06-29 (×2): 5 [IU] via SUBCUTANEOUS
  Filled 2024-06-28 (×4): qty 0.05

## 2024-06-28 MED ORDER — MAGNESIUM SULFATE 2 GM/50ML IV SOLN
2.0000 g | Freq: Once | INTRAVENOUS | Status: AC
  Administered 2024-06-28: 2 g via INTRAVENOUS
  Filled 2024-06-28: qty 50

## 2024-06-28 NOTE — TOC Initial Note (Signed)
 Transition of Care [facility]) - Initial/Assessment Note    Patient Details  Name: Tanya Frye MRN: [id number] Date of Birth: 01-03-1941  Transition of [facility]) CM/SW Contact:    Alfonso Rummer, LCSW Phone Number: 06/28/2024, 12:15 PM  Clinical Narrative:                  KEN DELENA Rummer met with pt HC POA/Grandson Darryle Severe. Pt is requiried snf placement with possibility of long term care. [place] FL2 completed and bed search started. Pt grandson reports a desire for pt to transition to [facility] care. TOC will continue to follow pt to develop a safe discharge.  Expected Discharge Plan: Skilled Nursing Facility Barriers to Discharge: Continued Medical Work up   Patient Goals and CMS Choice     Choice offered to / list presented to : [place] POA / Guardian Geroge Tollett)      Expected Discharge Plan and Services In-house Referral: Clinical Social Work Discharge Planning Services: CM Consult (Discharge planning)   Living arrangements for the past 2 months: Single Family Home                                      Prior Living Arrangements/Services Living arrangements for the past 2 months: Single Family Home Lives with:: Self Patient language and need for interpreter reviewed:: No Do you feel safe going back to the place where you live?: Yes      Need for Family Participation in Patient Care: Yes (Comment) Care giver support system in place?: Yes (comment)   Criminal Activity/Legal Involvement Pertinent to Current Situation/Hospitalization: No - Comment as needed  Activities of Daily Living      Permission Sought/Granted                  Emotional Assessment Appearance:: Appears stated age Attitude/Demeanor/Rapport: Engaged Affect (typically observed): Appropriate     Psych Involvement: No (comment)  Admission diagnosis:  Hypokalemia [E87.6] Hypomagnesemia [E83.42] Urinary tract infection without hematuria, site unspecified [N39.0] Altered mental  status, unspecified altered mental status type [R41.82] Patient Active Problem List   Diagnosis Date Noted   Hypomagnesemia 06/28/2024   Diarrhea 06/28/2024   CKD (chronic kidney disease), stage III (HCC) 06/28/2024   Physical deconditioning 06/28/2024   Paroxysmal atrial fibrillation (HCC) 06/28/2024   UTI (urinary tract infection) 06/28/2024   AMS (altered mental status) 06/27/2024   Hypokalemia 06/27/2024   Vitamin D  deficiency 06/14/2024   COPD exacerbation (HCC) 05/13/2024   Atrial flutter (HCC) 05/12/2024   Chronic HFrEF (heart failure with reduced ejection fraction) (HCC) 05/12/2024   CHF exacerbation (HCC) 05/09/2024   Pressure injury of skin 02/04/2024   Atrial fibrillation with RVR (HCC) 02/01/2024   Sepsis (HCC) 01/29/2024   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   Non-STEMI (non-ST elevated myocardial infarction) (HCC) 01/29/2024   Community acquired pneumonia 01/29/2024   Cardiogenic shock (HCC) 01/29/2024   Acute respiratory failure (HCC) 01/28/2024   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   Tachycardia 08/29/2020   Need for influenza vaccination 05/30/2020   Seasonal allergic rhinitis due to pollen 02/24/2020   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 [place], [place] - [address] [place] [address] Phone: [phone] Fax: [phone]     Social Drivers of Health  (SDOH) Social History: SDOH Screenings   Food Insecurity: No Food Insecurity (06/27/2024)  Housing: Unknown (06/27/2024)  Transportation Needs: No Transportation Needs (06/27/2024)  Utilities: Patient Unable To Answer (06/27/2024)  Alcohol Screen: Low Risk  (09/05/2021)  Depression (PHQ2-9): Low Risk  (06/14/2024)  Financial Resource Strain: Low Risk  (09/05/2021)  Physical Activity: Inactive (09/05/2021)  Social Connections: Patient Unable To Answer (06/27/2024)  Stress: No Stress Concern Present (09/05/2021)  Tobacco Use: Low Risk  (06/27/2024)   SDOH Interventions:     Readmission Risk Interventions     No data to display

## 2024-06-28 NOTE — Assessment & Plan Note (Signed)
 Seems more like behavioral issue, was evaluated by psych and did not meet the criteria for inpatient behavioral services.  Patient likely have underlying significant dementia with behavioral issues but no proper diagnosis which need to be done as outpatient.  Currently appears to be at her baseline.

## 2024-06-28 NOTE — Progress Notes (Signed)
 Received patient with heart rate of 47 and low of 28. MD previously made aware. Patient is on tele monitoring.

## 2024-06-28 NOTE — Assessment & Plan Note (Signed)
 Likely secondary to excessive laxative use, seems improving. -Holding excessive laxative Continue to monitor-

## 2024-06-28 NOTE — Assessment & Plan Note (Signed)
 Prior echo done in August 2025 with EF of 30 to 35%, moderate MR and AS Clinically appears euvolemic with BNP of 162 -Holding home diuretic due to diarrhea and seems dehydrated. - Continuing home metoprolol  and digoxin 

## 2024-06-28 NOTE — Assessment & Plan Note (Signed)
 Blood pressure borderline soft -Holding home losartan  and diuretic -Monitor blood pressure and we will resume home medications as appropriate

## 2024-06-28 NOTE — Assessment & Plan Note (Signed)
 History of CKD stage III A Renal function seems stable and at baseline. - Monitor renal function -Avoid nephrotoxins

## 2024-06-28 NOTE — Assessment & Plan Note (Signed)
 Currently in sinus rhythm. - Continue home Eliquis , digoxin  and amiodarone 

## 2024-06-28 NOTE — Assessment & Plan Note (Signed)
-  Continue with home Synthroid 

## 2024-06-28 NOTE — Assessment & Plan Note (Signed)
 Potassium improved to 3.5. - Continue to monitor and replete as needed

## 2024-06-28 NOTE — Telephone Encounter (Signed)
 Patients DIL LM  stating that the patient  was D/C from [facility] on Tuesday and fell Sunday and was taken to the ER and was admitted. They would like to have the patient evaluated for possible uti or psych eval they are looking to have the patient sent to [facility] amd the hospital will be filling out the Fl2, if you want the patient to see you for hospital follow up please send to the front desk for appt

## 2024-06-28 NOTE — Assessment & Plan Note (Signed)
 UA concerning for UTI with positive nitrites, leukocytes and bacteria.  Unable to explain any symptoms. Pending urine cultures,  - Continue with ceftriaxone  -Follow-up final urine culture results

## 2024-06-28 NOTE — Assessment & Plan Note (Signed)
 Recently discharged from SNF, physical therapy reevaluated her and recommending rehab.  Family is willing to pay out-of-pocket charges. -TOC for placement

## 2024-06-28 NOTE — Evaluation (Signed)
 Occupational Therapy Evaluation Patient Details Name: Tanya Frye MRN: [id number] DOB: 1941/01/08 Today's Date: 06/28/2024   History of Present Illness   83 y.o. female who comes in with concerns for altered mental status. Per family, pt was acting altered since coming back from the facility and has had UTIs previously that have caused her to act altered. Given recent delusions and paranoia family were concerned. PMH: COPD, CHF, HTN, T2DM, CKD, hypothyroidism, and A-fib on Eliquis .     Clinical Impressions Pt was seen for OT evaluation and co-tx with PT to optimize safety this date. Prior to hospital admission, pt reports living alone and perseverates on sharing that she doesn't trust/like some family members. VC to redirect to task. Pt presents with deficits in strength, balance, safety awareness, awareness of deficits, and activity tolerance, affecting safe and optimal ADL completion. Pt currently requires grossly MIN A for mobility with RW and LB ADL tasks including toileting. VC for safety during session, pt expressing her ability to complete things independently but requiring assist and cues for safety. Pt would benefit from skilled OT services to address noted impairments and functional limitations (see below for any additional details) in order to maximize safety and independence while minimizing future risk of falls, injury, and readmission. Anticipate the need for follow up OT services upon acute hospital DC.      If plan is discharge home, recommend the following:   A little help with walking and/or transfers;A lot of help with bathing/dressing/bathroom;Direct supervision/assist for medications management;Supervision due to cognitive status;Direct supervision/assist for financial management;Assist for transportation;Assistance with cooking/housework;Help with stairs or ramp for entrance     Functional Status Assessment   Patient has had a recent decline in their functional status  and demonstrates the ability to make significant improvements in function in a reasonable and predictable amount of time.     Equipment Recommendations   Other (comment) (defer, likely will need RW)     Recommendations for Other Services         Precautions/Restrictions   Precautions Precautions: Fall Recall of Precautions/Restrictions: Intact Restrictions Weight Bearing Restrictions Per Provider Order: No     Mobility Bed Mobility               General bed mobility comments: NT, in recliner pre and post session    Transfers Overall transfer level: Needs assistance Equipment used: None Transfers: Sit to/from Stand Sit to Stand: Min assist           General transfer comment: steadying assistance required for standing. decreased safety awareness for need for assistance. encouraged using rolling walker for safety although patient reports multiple times she does not need one      Balance Overall balance assessment: Needs assistance Sitting-balance support: Feet supported Sitting balance-Leahy Scale: Fair     Standing balance support: Bilateral upper extremity supported Standing balance-Leahy Scale: Poor Standing balance comment: heavy reliance on UE support (she prefers to use the window sill at times instead of the walker despite encouragement                           ADL either performed or assessed with clinical judgement   ADL Overall ADL's : Needs assistance/impaired                     Lower Body Dressing: Sit to/from stand;Minimal assistance   Toilet Transfer: Contact guard assist;Regular Toilet;Rolling walker (2 wheels);Ambulation;Grab bars   Toileting-  Clothing Manipulation and Hygiene: Minimal assistance;Sit to/from stand;Sitting/lateral lean Toileting - Clothing Manipulation Details (indicate cue type and reason): pt holds onto the wall to help pull herself in forward lean to complete pericare, requires MIN A for  clothing mgt in standing with VC for RW     Functional mobility during ADLs: Minimal assistance;Cueing for safety;Rolling walker (2 wheels)       Vision         Perception         Praxis         Pertinent Vitals/Pain Pain Assessment Pain Assessment: Faces Faces Pain Scale: Hurts a little bit Pain Location: right hip, peri-area Pain Descriptors / Indicators: Discomfort, Grimacing Pain Intervention(s): Monitored during session, Repositioned     Extremity/Trunk Assessment Upper Extremity Assessment Upper Extremity Assessment: Generalized weakness   Lower Extremity Assessment Lower Extremity Assessment: Generalized weakness       Communication Communication Communication: Impaired Factors Affecting Communication: Hearing impaired;Reduced clarity of speech   Cognition Arousal: Alert Behavior During Therapy:  (perseverative on not letting certian family members be involved with her care) Cognition: No family/caregiver present to determine baseline             OT - Cognition Comments: Pt alert and oriented, cues to redirect to task, rambles and shares feels that her family is taking over things but not doing it correctly                 Following commands: Impaired Following commands impaired: Follows one step commands with increased time     Cueing  General Comments   Cueing Techniques: Verbal cues;Gestural cues;Tactile cues      Exercises     Shoulder Instructions      Home Living Family/patient expects to be discharged to:: Private residence Living Arrangements: Alone Available Help at Discharge: Family;Available PRN/intermittently                             Additional Comments: recent discharge from rehab for several months to home. patient is a poor historian. Confirmed with grandson Darryle that patient is unable to stay with him upon hospital discharge.      Prior Functioning/Environment Prior Level of Function : Patient  poor historian/Family not available                    OT Problem List: Decreased strength;Decreased cognition;Decreased safety awareness;Decreased activity tolerance;Impaired balance (sitting and/or standing);Decreased knowledge of use of DME or AE   OT Treatment/Interventions: Self-care/ADL training;Therapeutic exercise;Therapeutic activities;Cognitive remediation/compensation;DME and/or AE instruction;Energy conservation;Balance training;Patient/family education      OT Goals(Current goals can be found in the care plan section)   Acute Rehab OT Goals Patient Stated Goal: pt wants to live with her grandson OT Goal Formulation: With patient Time For Goal Achievement: 07/12/24 Potential to Achieve Goals: Fair ADL Goals Pt Will Perform Lower Body Dressing: sit to/from stand;with supervision;with set-up Pt Will Transfer to Toilet: ambulating;with supervision (LRAD) Pt Will Perform Toileting - Clothing Manipulation and hygiene: with supervision;sit to/from stand;sitting/lateral leans Additional ADL Goal #1: Pt will complete bathing, primarily from seated position, with PRN MIN A and consistent supv for safety, 1/1 opportunity.   OT Frequency:  Min 2X/week    Co-evaluation PT/OT/SLP Co-Evaluation/Treatment: Yes Reason for Co-Treatment: For patient/therapist safety;To address functional/ADL transfers;Necessary to address cognition/behavior during functional activity PT goals addressed during session: Mobility/safety with mobility;Balance;Proper use of DME OT goals addressed during session:  ADL's and self-care;Proper use of Adaptive equipment and DME      AM-PAC OT 6 Clicks Daily Activity     Outcome Measure Help from another person eating meals?: None Help from another person taking care of personal grooming?: A Little Help from another person toileting, which includes using toliet, bedpan, or urinal?: A Little Help from another person bathing (including washing, rinsing,  drying)?: A Little Help from another person to put on and taking off regular upper body clothing?: A Little Help from another person to put on and taking off regular lower body clothing?: A Little 6 Click Score: 19   End of Session Equipment Utilized During Treatment: Rolling walker (2 wheels) Nurse Communication: Mobility status  Activity Tolerance: Patient tolerated treatment well Patient left: in chair;with call bell/phone within reach;with chair alarm set  OT Visit Diagnosis: Other abnormalities of gait and mobility (R26.89);Muscle weakness (generalized) (M62.81);Other symptoms and signs involving cognitive function                Time: 9057-8984 OT Time Calculation (min): 33 min Charges:  OT General Charges $OT Visit: 1 Visit OT Evaluation $OT Eval Moderate Complexity: 1 Mod  Warren SAUNDERS., MPH, MS, OTR/L ascom [phone] 06/28/24, 11:54 AM

## 2024-06-28 NOTE — Assessment & Plan Note (Signed)
 CBG within goal. - Patient takes Lantus  8 units at home -Starting Semglee  at 5 units at bedtime -Continue with sensitive SSI

## 2024-06-28 NOTE — Care Management Obs Status (Signed)
 MEDICARE OBSERVATION STATUS NOTIFICATION   Patient Details  Name: Tanya Frye MRN: [id number] Date of Birth: 09/12/1941   Medicare Observation Status Notification Given:  Yes    [name] 06/28/2024, 12:02 PM

## 2024-06-28 NOTE — [place] FL2 (Signed)
 [place]  MEDICAID FL2 LEVEL OF CARE FORM     IDENTIFICATION  Patient Name: Tanya Frye Birthdate: August 13, 1941 Sex: female Admission Date (Current Location): 06/27/2024  [facility] and [place] Number:  Chiropodist and Address:  [facility], [address], [place], [place] [address]      Provider Number: [id number]  Attending Physician Name and Address:  Caleen Qualia, MD  Relative Name and Phone Number:  Yitta Gongaware [phone]    Current Level of Care: SNF Recommended Level of Care: Skilled Nursing Facility Prior Approval Number:    Date Approved/Denied:   PASRR Number: [id number]  Discharge Plan: SNF    Current Diagnoses: Patient Active Problem List   Diagnosis Date Noted   AMS (altered mental status) 06/27/2024   Hypokalemia 06/27/2024   Vitamin D  deficiency 06/14/2024   COPD exacerbation (HCC) 05/13/2024   Atrial flutter (HCC) 05/12/2024   Acute on chronic HFrEF (heart failure with reduced ejection fraction) (HCC) 05/12/2024   CHF exacerbation (HCC) 05/09/2024   Pressure injury of skin 02/04/2024   Atrial fibrillation with RVR (HCC) 02/01/2024   Sepsis (HCC) 01/29/2024   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   Non-STEMI (non-ST elevated myocardial infarction) (HCC) 01/29/2024   Community acquired pneumonia 01/29/2024   Cardiogenic shock (HCC) 01/29/2024   Acute respiratory failure (HCC) 01/28/2024   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   Tachycardia 08/29/2020   Need for influenza vaccination 05/30/2020   Seasonal allergic rhinitis due to pollen 02/24/2020   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 Yes; Fluid consistency: Thin: Dysphagia starting at 10/05 1508)  AMBULATORY STATUS COMMUNICATION OF NEEDS Skin   Limited Assist   Normal                       Personal Care Assistance Level of Assistance  Bathing, Feeding, Dressing Bathing Assistance: Limited assistance Feeding assistance: Limited assistance Dressing Assistance: Limited assistance     Functional Limitations Info             SPECIAL CARE FACTORS FREQUENCY  PT (By licensed PT), OT (By licensed OT)     PT Frequency: 5x OT Frequency: 5x            Contractures      Additional Factors Info  Code Status Code Status Info: DNR Limited             Current Medications (06/28/2024):  This is the current hospital active medication list Current Facility-Administered Medications  Medication Dose Route Frequency Provider Last Rate Last Admin   acetaminophen  (TYLENOL ) tablet 650 mg  650 mg Oral Q8H PRN Laurita Manor T, MD       amiodarone  (PACERONE ) tablet 200 mg  200 mg Oral Daily Laurita Manor T, MD   200 mg at 06/28/24 9178   apixaban  (ELIQUIS ) tablet 5 mg  5 mg Oral BID Laurita Manor T, MD   5 mg at 06/28/24 0820   calcium carbonate (OS-CAL - dosed in mg  of elemental calcium) tablet 2,500 mg  1,000 mg of elemental calcium Oral Q breakfast Caleen Qualia, MD       cefTRIAXone  (ROCEPHIN ) 2 g in sodium chloride  0.9 % 100 mL IVPB  2 g Intravenous Q24H Laurita Cort DASEN, MD   Stopped at 06/27/24 1611   digoxin  (LANOXIN ) tablet 0.0625 mg  0.0625 mg Oral Daily Laurita Cort T, MD   0.0625 mg at 06/28/24 9180   fluticasone  (FLONASE ) 50 MCG/ACT nasal spray 1 spray  1 spray Each Nare Daily PRN Laurita Cort DASEN, MD       insulin  aspart (novoLOG ) injection 0-9 Units  0-9 Units Subcutaneous TID WC Laurita Cort T, MD       levothyroxine  (SYNTHROID ) tablet 75 mcg  75 mcg  Oral Q0600 Laurita Cort T, MD   75 mcg at 06/28/24 0545   metoprolol  tartrate (LOPRESSOR ) tablet 25 mg  25 mg Oral BID Laurita Cort T, MD   25 mg at 06/28/24 0820   ondansetron  (ZOFRAN -ODT) disintegrating tablet 4 mg  4 mg Oral Q8H PRN Laurita Cort T, MD       polyethylene glycol (MIRALAX  / GLYCOLAX ) packet 17 g  17 g Oral Daily PRN Laurita Cort T, MD       potassium chloride  (KLOR-CON ) packet 40 mEq  40 mEq Oral Once Amin, Sumayya, MD       pravastatin  (PRAVACHOL ) tablet 10 mg  10 mg Oral q1800 Zhang, Ping T, MD   10 mg at 06/27/24 1837   traMADol (ULTRAM) tablet 50 mg  50 mg Oral Daily PRN Laurita Cort DASEN, MD         Discharge Medications: Please see discharge summary for a list of discharge medications.  Relevant Imaging Results:  Relevant Lab Results:   Additional Information [phone] [place]

## 2024-06-28 NOTE — Assessment & Plan Note (Signed)
 Magnesium  of 1.6. - Replete magnesium  and monitor

## 2024-06-28 NOTE — Progress Notes (Addendum)
 Progress Note   Patient: Tanya Frye FMW:[id number] DOB: Jun 20, 1941 DOA: 06/27/2024     0 DOS: the patient was seen and examined on 06/28/2024   Brief hospital course: Partly taken from H&P and prior notes.  Donyea Gafford Andress is a 83 y.o. female with medical history significant of COPD, chronic HFrEF, HTN, IDDM, PAF on Eliquis , CKD stage IIIa, was brought in by family member for altered mentation.   Patient was recently hospitalized in late August for pneumonia and cardiogenic shock, and she was discharged to nursing home.  Monday, patient was brought home and she found  they moved everything to different places.  That makes her more aggressive behavior when she was refusing medications, throwing away different stuff.  She was also having some diarrhea and frequent accidents of loose bowel movements over the past 3 to 4 days.  Feeling weak, apparently she was taking a whole lot of stool softeners and laxative.  Decreased p.o. intake for the past 2 days.  On presentation vital stable, labs with potassium of 2.6 and magnesium  of 1.6.  TSH normal, UA with significant proteinuria, ketonuria, positive nitrite and leukocytes, urine cultures were ordered.  UDS positive for benzodiazepine.  Mentation appears to be at baseline.  For family concern that she had an history of suicidal ideation in the past, psych was consulted.  Patient was unable to participate in a very meaningful history but she did not meet criteria for inpatient psychiatric admission and remain low risk of self-harm.  10/6: Vital stable, subtherapeutic digoxin  level likely secondary to being noncompliant, mild hypocalcemia so started on p.o. supplement. PT and OT recommending SNF.  Seems like significant underlying dementia but no proper evaluation or diagnosis.  Family is willing to pay out-of-pocket for rehab.  Later monitor concern of bradycardia, heart rate mostly in 30s with couple of pauses.  Patient remained asymptomatic.   Discontinuing home metoprolol  and monitoring.  Assessment and Plan: * Hypokalemia Potassium improved to 3.5 -Continue to monitor and replete as needed  Hypomagnesemia Magnesium  of 1.6 Replete magnesium  and monitor-  AMS (altered mental status) Seems more like behavioral issue, was evaluated by psych and did not meet the criteria for inpatient behavioral services.  Patient likely have underlying significant dementia with behavioral issues but no proper diagnosis which need to be done as outpatient.  Currently appears to be at her baseline.  Diarrhea Likely secondary to excessive laxative use, seems improving. -Holding excessive laxative Continue to monitor-  Chronic HFrEF (heart failure with reduced ejection fraction) (HCC) Prior echo done in August 2025 with EF of 30 to 35%, moderate MR and AS Clinically appears euvolemic with BNP of 162 -Holding home diuretic due to diarrhea and seems dehydrated. - Continuing home metoprolol  and digoxin   Hypertension Blood pressure borderline soft -Holding home losartan  and diuretic -Monitor blood pressure and we will resume home medications as appropriate  CKD (chronic kidney disease), stage III (HCC) History of CKD stage III A Renal function seems stable and at baseline. - Monitor renal function -Avoid nephrotoxins  UTI (urinary tract infection) UA concerning for UTI with positive nitrites, leukocytes and bacteria.  Unable to explain any symptoms. Pending urine cultures,  - Continue with ceftriaxone  -Follow-up final urine culture results  Paroxysmal atrial fibrillation (HCC) Currently in sinus rhythm. - Continue home Eliquis , digoxin  and amiodarone   Diabetes mellitus without complication (HCC) CBG within goal. - Patient takes Lantus  8 units at home -Starting Semglee  at 5 units at bedtime -Continue with sensitive SSI  Hypothyroidism - Continue with home Synthroid   Physical deconditioning Recently discharged from SNF,  physical therapy reevaluated her and recommending rehab.  Family is willing to pay out-of-pocket charges. -TOC for placement   Subjective: Patient was seen and examined today.  Very hard of hearing lady, sitting comfortably in chair.  No concern.  Denies any pain.  Unable to explain any urinary symptoms while  Physical Exam: Vitals:   06/27/24 1937 06/28/24 0337 06/28/24 0338 06/28/24 0730  BP: 126/64  (!) 105/51 (!) 108/54  Pulse: 93 66 69 66  Resp: 16 16 16 16   Temp: 98 F (36.7 C) 99.4 F (37.4 C) 99.4 F (37.4 C) 98.2 F (36.8 C)  TempSrc:    Oral  SpO2: 99% 97% 98% 98%  Weight:      Height:       General.  Frail and very hard of hearing elderly lady, in no acute distress. Pulmonary.  Lungs clear bilaterally, normal respiratory effort. CV.  Regular rate and rhythm, no JVD, rub or murmur. Abdomen.  Soft, nontender, nondistended, BS positive. CNS.  Alert and oriented .  No focal neurologic deficit. Extremities.  No edema, pulses intact and symmetrical.  Data Reviewed: Prior data reviewed  Family Communication: Discussed with grandson/POA at bedside  Disposition: Status is: Observation The patient remains OBS appropriate and will d/c before 2 midnights.  Planned Discharge Destination: Skilled nursing facility  DVT prophylaxis.  Eliquis  Time spent: 50 minutes  This record has been created using Conservation officer, historic buildings. Errors have been sought and corrected,but may not always be located. Such creation errors do not reflect on the standard of care.   Author: Amaryllis Dare, MD 06/28/2024 12:49 PM  For on call review [email or web address].

## 2024-06-28 NOTE — Evaluation (Signed)
 Physical Therapy Evaluation Patient Details Name: BRISSA ASANTE MRN: [id number] DOB: 1940-10-17 Today's Date: 06/28/2024  History of Present Illness  83 y.o. female who comes in with concerns for altered mental status. Per family, pt was acting altered since coming back from the facility and has had UTIs previously that have caused her to act altered. Given recent delusions and paranoia family were concerned. PMH: COPD, CHF, HTN, T2DM, CKD, hypothyroidism, and A-fib on Eliquis .   Clinical Impression  Patient reports she intermittently uses a rolling walker at home. She is a poor historian. Darden Ivans reports patient will be unable to return home and does not have 24/7 assistance available at discharge.   Today the patient required minimal assistance with mobility. She has decreased safety awareness and recognition for need for physical assistance for fall prevention. Short distance ambulation with steadying assistance provided. Patient reports right hip pain with movement. Recommend to continue PT to maximize independence and decrease caregiver burden. Rehabilitation < 3 hours/day recommended after this hospital stay.       If plan is discharge home, recommend the following: A little help with walking and/or transfers;A little help with bathing/dressing/bathroom;Supervision due to cognitive status;Assistance with cooking/housework;Direct supervision/assist for medications management;Direct supervision/assist for financial management;Assist for transportation;Help with stairs or ramp for entrance   Can travel by private vehicle   No    Equipment Recommendations None recommended by PT  Recommendations for Other Services       Functional Status Assessment Patient has had a recent decline in their functional status and demonstrates the ability to make significant improvements in function in a reasonable and predictable amount of time.     Precautions / Restrictions  Precautions Precautions: Fall Recall of Precautions/Restrictions: Intact Restrictions Weight Bearing Restrictions Per Provider Order: No      Mobility  Bed Mobility               General bed mobility comments:  (not assessed as patient sitting up on arrival and post session)    Transfers Overall transfer level: Needs assistance Equipment used: None Transfers: Sit to/from Stand Sit to Stand: Min assist           General transfer comment: steadying assistance required for standing. decreased safety awareness for need for assistance. encouraged using rolling walker for safety although patient reports multiple times she does not need one    Ambulation/Gait Ambulation/Gait assistance: Min assist Gait Distance (Feet): 40 Feet Assistive device: Rolling walker (2 wheels) Gait Pattern/deviations: Decreased stride length, Wide base of support Gait velocity: decreased     General Gait Details: right hip pain reported with mobility (she declined need for pain medication). mild unsteadiness with steadying assistance required for short distance ambulation  Stairs            Wheelchair Mobility     Tilt Bed    Modified Rankin (Stroke Patients Only)       Balance Overall balance assessment: Needs assistance Sitting-balance support: Feet supported Sitting balance-Leahy Scale: Fair     Standing balance support: Bilateral upper extremity supported Standing balance-Leahy Scale: Poor Standing balance comment: heavy reliance on UE support (she prefers to use the window sill at times instead of the walker despite encouragement                             Pertinent Vitals/Pain Pain Assessment Pain Assessment: Faces Faces Pain Scale: Hurts a little bit Pain Location: right hip,  peri-area Pain Descriptors / Indicators: Discomfort, Grimacing Pain Intervention(s): Monitored during session    Home Living Family/patient expects to be discharged to::  Private residence Living Arrangements: Alone Available Help at Discharge: Family;Available PRN/intermittently               Additional Comments: recent discharge from rehab for several months to home. patient is a poor historian. Confirmed with grandson Darryle that patient is unable to stay with him upon hospital discharge.    Prior Function Prior Level of Function : Patient poor historian/Family not available                     Extremity/Trunk Assessment   Upper Extremity Assessment Upper Extremity Assessment: Generalized weakness    Lower Extremity Assessment Lower Extremity Assessment: Generalized weakness       Communication   Communication Communication: Impaired Factors Affecting Communication: Hearing impaired;Reduced clarity of speech    Cognition Arousal: Alert Behavior During Therapy:  (perseverative on not letting certian family members be involved with her care)   PT - Cognitive impairments: Safety/Judgement, Memory                       PT - Cognition Comments: decreased awareness for need for physical assistance. Following commands: Impaired Following commands impaired: Follows one step commands with increased time     Cueing Cueing Techniques: Verbal cues, Gestural cues, Tactile cues     General Comments      Exercises     Assessment/Plan    PT Assessment Patient needs continued PT services  PT Problem List Decreased strength;Decreased activity tolerance;Decreased range of motion;Decreased mobility;Decreased balance;Decreased safety awareness;Decreased cognition       PT Treatment Interventions DME instruction;Gait training;Stair training;Functional mobility training;Therapeutic activities;Therapeutic exercise;Balance training;Neuromuscular re-education;Cognitive remediation;Patient/family education    PT Goals (Current goals can be found in the Care Plan section)  Acute Rehab PT Goals Patient Stated Goal: to go to  [facility] house PT Goal Formulation: With family Time For Goal Achievement: 07/12/24 Potential to Achieve Goals: Fair    Frequency Min 2X/week     Co-evaluation   Reason for Co-Treatment: For patient/therapist safety;To address functional/ADL transfers;Necessary to address cognition/behavior during functional activity PT goals addressed during session: Mobility/safety with mobility;Balance;Proper use of DME OT goals addressed during session: ADL's and self-care;Proper use of Adaptive equipment and DME       AM-PAC PT 6 Clicks Mobility  Outcome Measure Help needed turning from your back to your side while in a flat bed without using bedrails?: A Little Help needed moving from lying on your back to sitting on the side of a flat bed without using bedrails?: A Lot Help needed moving to and from a bed to a chair (including a wheelchair)?: A Little Help needed standing up from a chair using your arms (e.g., wheelchair or bedside chair)?: A Little Help needed to walk in hospital room?: A Little Help needed climbing 3-5 steps with a railing? : A Lot 6 Click Score: 16    End of Session   Activity Tolerance: Patient limited by fatigue Patient left: in chair;with call bell/phone within reach;with chair alarm set Nurse Communication: Mobility status PT Visit Diagnosis: Muscle weakness (generalized) (M62.81);Unsteadiness on feet (R26.81)    Time: 9053-8983 PT Time Calculation (min) (ACUTE ONLY): 30 min   Charges:   PT Evaluation $PT Eval Moderate Complexity: 1 Mod   PT General Charges $$ ACUTE PT VISIT: 1 Visit  Randine Essex, PT, MPT   Randine LULLA Essex 06/28/2024, 10:45 AM

## 2024-06-28 NOTE — Hospital Course (Addendum)
 Partly taken from H&P and prior notes.  Tanya Frye is a 83 y.o. female with medical history significant of COPD, chronic HFrEF, HTN, IDDM, PAF on Eliquis , CKD stage IIIa, was brought in by family member for altered mentation.   Patient was recently hospitalized in late August for pneumonia and cardiogenic shock, and she was discharged to nursing home.  Monday, patient was brought home and she found  they moved everything to different places.  That makes her more aggressive behavior when she was refusing medications, throwing away different stuff.  She was also having some diarrhea and frequent accidents of loose bowel movements over the past 3 to 4 days.  Feeling weak, apparently she was taking a whole lot of stool softeners and laxative.  Decreased p.o. intake for the past 2 days.  On presentation vital stable, labs with potassium of 2.6 and magnesium  of 1.6.  TSH normal, UA with significant proteinuria, ketonuria, positive nitrite and leukocytes, urine cultures were ordered.  UDS positive for benzodiazepine.  Mentation appears to be at baseline.  For family concern that she had an history of suicidal ideation in the past, psych was consulted.  Patient was unable to participate in a very meaningful history but she did not meet criteria for inpatient psychiatric admission and remain low risk of self-harm.  10/6: Vital stable, subtherapeutic digoxin  level likely secondary to being noncompliant, mild hypocalcemia so started on p.o. supplement. PT and OT recommending SNF.  Seems like significant underlying dementia but no proper evaluation or diagnosis.  Family is willing to pay out-of-pocket for rehab.  Later monitor concern of bradycardia, heart rate mostly in 30s with couple of pauses.  Patient remained asymptomatic.  Discontinuing home metoprolol  and monitoring.  10/7: Persistent borderline bradycardia, home metoprolol  and amiodarone  both were discontinued by cardiology.  Urine cultures with  Klebsiella pneumonia-antibiotics switched with Keflex based on susceptibility results.  Still pending placement.  10/8: Bradycardia resolved, cardiology ordered repeat echo-done with pending results.  IV Lasix  was ordered but she lost IV and does not want to be replaced, ordered torsemide  20 mg daily. Awaiting placement.  10/9: Hemodynamically stable, at time refusing meds and food and becoming agitated.  Still pending disposition, patient need to be a private pay as has used all of her rehab days.  10/10: Remained hemodynamically stable, heart rate now improving so restarting home metoprolol .  Discussed with cardiology and they would like to keep holding amiodarone  and digoxin , they will arrange close outpatient follow-up and restart those medications when appropriate.  Patient completed a course of antibiotic while in the hospital.  She will continue on current medications and need to have a close follow-up with her providers for further assistance.

## 2024-06-28 NOTE — Progress Notes (Signed)
 CCMD called earlier about cardiac pauses and a low heart rate, reaching as low as 28 at one point. Checked on pt and she was completely asymptomatic, chatting, laughing and acting her normal sefl. Called Dr. Caleen and orders received to d/c metoprolol  and continue to monitor patient.

## 2024-06-29 DIAGNOSIS — I4891 Unspecified atrial fibrillation: Secondary | ICD-10-CM

## 2024-06-29 DIAGNOSIS — E876 Hypokalemia: Secondary | ICD-10-CM | POA: Diagnosis not present

## 2024-06-29 DIAGNOSIS — N390 Urinary tract infection, site not specified: Secondary | ICD-10-CM | POA: Diagnosis not present

## 2024-06-29 DIAGNOSIS — R4182 Altered mental status, unspecified: Secondary | ICD-10-CM | POA: Diagnosis not present

## 2024-06-29 DIAGNOSIS — I5022 Chronic systolic (congestive) heart failure: Secondary | ICD-10-CM | POA: Diagnosis not present

## 2024-06-29 LAB — RENAL FUNCTION PANEL
Albumin: 2.6 g/dL — ABNORMAL LOW (ref 3.5–5.0)
Anion gap: 8 (ref 5–15)
BUN: 22 mg/dL (ref 8–23)
CO2: 23 mmol/L (ref 22–32)
Calcium: 8.3 mg/dL — ABNORMAL LOW (ref 8.9–10.3)
Chloride: 102 mmol/L (ref 98–111)
Creatinine, Ser: 1.13 mg/dL — ABNORMAL HIGH (ref 0.44–1.00)
GFR, Estimated: 49 mL/min — ABNORMAL LOW (ref >60)
Glucose, Bld: 106 mg/dL — ABNORMAL HIGH (ref 70–99)
Phosphorus: 2.5 mg/dL (ref 2.5–4.6)
Potassium: 4 mmol/L (ref 3.5–5.1)
Sodium: 133 mmol/L — ABNORMAL LOW (ref 135–145)

## 2024-06-29 LAB — MAGNESIUM: Magnesium: 2.4 mg/dL (ref 1.7–2.4)

## 2024-06-29 LAB — GLUCOSE, CAPILLARY
Glucose-Capillary: 106 mg/dL — ABNORMAL HIGH (ref 70–99)
Glucose-Capillary: 175 mg/dL — ABNORMAL HIGH (ref 70–99)
Glucose-Capillary: 134 mg/dL — ABNORMAL HIGH (ref 70–99)
Glucose-Capillary: 96 mg/dL (ref 70–99)

## 2024-06-29 LAB — URINE CULTURE: Culture: >=100000 — AB

## 2024-06-29 MED ORDER — FUROSEMIDE 10 MG/ML IJ SOLN
20.0000 mg | Freq: Once | INTRAMUSCULAR | Status: AC
  Administered 2024-06-29: 20 mg via INTRAVENOUS
  Filled 2024-06-29: qty 4

## 2024-06-29 MED ORDER — CEPHALEXIN 500 MG PO CAPS
500.0000 mg | ORAL_CAPSULE | Freq: Two times a day (BID) | ORAL | Status: AC
  Administered 2024-06-29 – 2024-07-01 (×4): 500 mg via ORAL
  Filled 2024-06-29 (×6): qty 1

## 2024-06-29 NOTE — Progress Notes (Signed)
 Progress Note   Patient: Tanya Frye FMW:[id number] DOB: [birth year] DOA: 06/27/2024     0 DOS: the patient was seen and examined on 06/29/2024   Brief hospital course: Partly taken from H&P and prior notes.  Tanya Frye is a 83 y.o. female with medical history significant of COPD, chronic HFrEF, HTN, IDDM, PAF on Eliquis , CKD stage IIIa, was brought in by family member for altered mentation.   Patient was recently hospitalized in late August for pneumonia and cardiogenic shock, and she was discharged to nursing home.  Monday, patient was brought home and she found  they moved everything to different places.  That makes her more aggressive behavior when she was refusing medications, throwing away different stuff.  She was also having some diarrhea and frequent accidents of loose bowel movements over the past 3 to 4 days.  Feeling weak, apparently she was taking a whole lot of stool softeners and laxative.  Decreased p.o. intake for the past 2 days.  On presentation vital stable, labs with potassium of 2.6 and magnesium  of 1.6.  TSH normal, UA with significant proteinuria, ketonuria, positive nitrite and leukocytes, urine cultures were ordered.  UDS positive for benzodiazepine.  Mentation appears to be at baseline.  For family concern that she had an history of suicidal ideation in the past, psych was consulted.  Patient was unable to participate in a very meaningful history but she did not meet criteria for inpatient psychiatric admission and remain low risk of self-harm.  10/6: Vital stable, subtherapeutic digoxin  level likely secondary to being noncompliant, mild hypocalcemia so started on p.o. supplement. PT and OT recommending SNF.  Seems like significant underlying dementia but no proper evaluation or diagnosis.  Family is willing to pay out-of-pocket for rehab.  Later monitor concern of bradycardia, heart rate mostly in 30s with couple of pauses.  Patient remained asymptomatic.   Discontinuing home metoprolol  and monitoring.  10/7: Persistent borderline bradycardia, home metoprolol  and amiodarone  both were discontinued by cardiology.  Urine cultures with Klebsiella pneumonia-antibiotics switched with Keflex based on susceptibility results.  Still pending placement.  Assessment and Plan: * Hypokalemia Resolved -Continue to monitor and replete as needed  Hypomagnesemia Resolved with repletion  AMS (altered mental status) Seems more like behavioral issue, was evaluated by psych and did not meet the criteria for inpatient behavioral services.  Patient likely have underlying significant dementia with behavioral issues but no proper diagnosis which need to be done as outpatient.  Currently appears to be at her baseline.  Diarrhea Likely secondary to excessive laxative use, seems improving. -Holding excessive laxative Continue to monitor-  Chronic HFrEF (heart failure with reduced ejection fraction) (HCC) Prior echo done in August 2025 with EF of 30 to 35%, moderate MR and AS Clinically appears euvolemic with BNP of 162 -Holding home diuretic due to diarrhea and seems dehydrated. - Home digoxin  and metoprolol  is being held by cardiology due to borderline bradycardia  Hypertension Blood pressure within goal -Holding home losartan  and diuretic -Monitor blood pressure and we will resume home medications as appropriate  CKD (chronic kidney disease), stage III (HCC) History of CKD stage III A Renal function seems stable and at baseline. - Monitor renal function -Avoid nephrotoxins  UTI (urinary tract infection) UA concerning for UTI with positive nitrites, leukocytes and bacteria.  Unable to explain any symptoms. Pending urine cultures,  - Continue with ceftriaxone  -Follow-up final urine culture results  Paroxysmal atrial fibrillation (HCC) Currently in sinus rhythm. - Continue home Eliquis ,  -  Eliquis  and digoxin  is being held by cardiology today due to  borderline bradycardia and pauses.  Diabetes mellitus without complication (HCC) CBG within goal. - Patient takes Lantus  8 units at home -Starting Semglee  at 5 units at bedtime -Continue with sensitive SSI  Hypothyroidism - Continue with home Synthroid   Physical deconditioning Recently discharged from SNF, physical therapy reevaluated her and recommending rehab.  Family is willing to pay out-of-pocket charges. -TOC for placement   Subjective: Very hard of hearing lady who was sitting in chair, no no new concern, unable to participate with any meaningful conversation, keep telling different irrelevant stories.  Physical Exam: Vitals:   06/28/24 0730 06/28/24 1447 06/28/24 2004 06/29/24 0811  BP: (!) 108/54 97/65 (!) 113/54 (!) 120/45  Pulse: 66 (!) 40 (!) 41 (!) 42  Resp: 16 18 16 16   Temp: 98.2 F (36.8 C) 97.9 F (36.6 C) 97.8 F (36.6 C) 97.8 F (36.6 C)  TempSrc: Oral Oral    SpO2: 98% 99% 99% 100%  Weight:      Height:       General.  Frail and very hard of hearing elderly lady, in no acute distress. Pulmonary.  Lungs clear bilaterally, normal respiratory effort. CV.  Regular rate and rhythm, no JVD, rub or murmur. Abdomen.  Soft, nontender, nondistended, BS positive. CNS.  Alert and oriented .  No focal neurologic deficit. Extremities.  Trace LE edema,  pulses intact and symmetrical.  Data Reviewed: Prior data reviewed  Family Communication: Talked with Grandson on phone.  Disposition: Status is: Observation The patient remains OBS appropriate and will d/c before 2 midnights.  Planned Discharge Destination: Skilled nursing facility  DVT prophylaxis.  Eliquis  Time spent: 50 minutes  This record has been created using Conservation officer, historic buildings. Errors have been sought and corrected,but may not always be located. Such creation errors do not reflect on the standard of care.   Author: Amaryllis Dare, MD 06/29/2024 3:04 PM  For on call review  [email or web address].

## 2024-06-29 NOTE — Assessment & Plan Note (Signed)
 Prior echo done in August 2025 with EF of 30 to 35%, moderate MR and AS Clinically appears euvolemic with BNP of 162 -Holding home diuretic due to diarrhea and seems dehydrated. - Home digoxin  and metoprolol  is being held by cardiology due to borderline bradycardia

## 2024-06-29 NOTE — Assessment & Plan Note (Signed)
 History of CKD stage III A Renal function seems stable and at baseline. - Monitor renal function -Avoid nephrotoxins

## 2024-06-29 NOTE — Assessment & Plan Note (Signed)
 Blood pressure within goal -Holding home losartan  and diuretic -Monitor blood pressure and we will resume home medications as appropriate

## 2024-06-29 NOTE — Assessment & Plan Note (Signed)
Resolved with repletion. ?

## 2024-06-29 NOTE — Progress Notes (Signed)
 Mobility Specialist Progress Note:    06/29/24 1509  Mobility  Activity Ambulated with assistance  Level of Assistance Contact guard assist, steadying assist  Assistive Device Front wheel walker  Distance Ambulated (ft) 140 ft  Range of Motion/Exercises Active;All extremities  Activity Response Tolerated well  Mobility visit 1 Mobility  Mobility Specialist Start Time (ACUTE ONLY) 1441  Mobility Specialist Stop Time (ACUTE ONLY) 1506  Mobility Specialist Time Calculation (min) (ACUTE ONLY) 25 min   Pt received in chair, agreeable to mobility. Required CGA to stand and ambulate with RW. Tolerated well, asx throughout. Returned to chair, alarm on and belongings in reach. All needs met.  Sherrilee Ditty Mobility Specialist Please contact via Special educational needs teacher or  Rehab office at [phone]

## 2024-06-29 NOTE — Progress Notes (Signed)
 Mobility Specialist Progress Note:    06/29/24 1030  Mobility  Activity Refused and notified nurse if applicable   Pt refused mobility, stated I can't get up I want to rest. Will attempt as it is appropriate, all needs met.  Sherrilee Ditty Mobility Specialist Please contact via Special educational needs teacher or  Rehab office at [phone]

## 2024-06-29 NOTE — Consult Note (Signed)
 Cardiology Consultation   Patient ID: Tanya Frye MRN: [id number]; DOB: 07/14/1941  Admit date: 06/27/2024 Date of Consult: 06/29/2024  PCP:  Weyman Bright, MD   [facility] Providers Cardiologist:  Evalene Lunger, MD        Patient Profile: Tanya Frye is a 83 y.o. 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 Caleen.  History of Present Illness: Tanya Frye was previously hospitalized  at [facility] in late August for pneumonia and cardiogenic shock.  She was treated with digoxin , beta-blocker therapy and amiodarone  for atrial flutter.  Once heart rate was improved, after diuresis volume status improved and she was medically stable for discharge.  She was discharged to nursing home.   She presented today comes emergency department on 06/27/2024 from home via EMS for altered mental status and confusion.  Per family the patient kicked them out of her house the other day.  Family cleaned the house when they left.  Today the house was trashed and the patient was he is apparently moving things that she cannot find them.  Family was concerned that patient had not been acting like herself.  Patient is not a very good historian and goes off on tangents and gets confused about what she is talking about.  when trying to ask her if she has been compliant with her current medication regimen she is unsure as she states that there is a family member that brings her medications to her.  Family also stated that she has been acting altered since coming back to the facility and that she had UTIs previously and they called her static this way.  Patient was throwing away the TV saying it was not working even though wellness.   Stated that she had not been taking her medication and had been eating or drinking a bit barricading her door.  There were some concerns of delusions and paranoia.  Initial vital signs revealed a blood pressure 141/64, pulse 92, respiration of 20, temperature 97.9.  Labs showed a potassium of 2.6 and magnesium  1.6.  She was given IV potassium replacements.  TSH was normal, UA with significant proteinuria, ketonuria, positive nitrates and leukocytes, urine cultures were ordered, UDS was positive for benzodiazepine.  Also with concerns that was a psych consult that was placed.  She was started on quetiapine  12.5 mg twice daily as needed for agitation.  She did not meet inpatient behavioral unit admission.  Staff was to minimize disturbances at night and to use delirium precautions.  Cardiology was consulted to assist with ongoing management of asymptomatic bradycardia.   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;  Service: Ophthalmology;  Laterality: Right;   CATARACT EXTRACTION W/PHACO Left 01/03/2021   Procedure: CATARACT EXTRACTION PHACO AND INTRAOCULAR LENS PLACEMENT (IOC) LEFT DIABETIC  7.37 01:15.6 9.8%;  Surgeon: Mittie Gaskin, MD;  Location: [facility];  Service: Ophthalmology;  Laterality: Left;   CHOLECYSTECTOMY     DILATION AND CURETTAGE OF UTERUS  before 1990   X2   NECK SURGERY  03/30/2008    Per family, pt broke her neck   STOMACH SURGERY     tumor removed, per family     Home Medications:  Prior to Admission medications   Medication Sig Start Date End Date Taking? Authorizing Provider  acetaminophen  (TYLENOL ) 650 MG CR tablet Take 650 mg by mouth every 8 (eight) hours as needed for pain.   Yes [provider]  amiodarone  (PACERONE ) 200 MG tablet Take 2 tablets (400 mg total) by mouth daily for 3 days, THEN 1 tablet (200 mg total) daily. 05/14/24 07/16/24 Yes Laurita Pillion, MD  apixaban  (ELIQUIS ) 5 MG TABS tablet Take 1 tablet (5 mg total) by mouth 2 (two) times daily. 02/13/24  Yes Awanda City, MD  ascorbic acid  (VITAMIN C ) 500 MG tablet Take 500 mg by mouth daily.   Yes [provider]  calcium carbonate (OSCAL) 1500 (600 Ca) MG TABS tablet Take 1,500 mg by mouth in the morning and at bedtime.   Yes [provider]  digoxin  62.5 MCG TABS Take 0.0625 mg by mouth daily. 05/14/24  Yes Laurita Pillion, MD  docusate sodium  (COLACE) 100 MG capsule Take 100 mg by mouth 2 (two) times daily.   Yes [provider]  fluticasone  (FLONASE ) 50 MCG/ACT nasal spray Place 1 spray into both nostrils daily.   Yes [provider]  furosemide  (LASIX ) 20 MG tablet Take 1 tablet (20 mg total) by mouth daily. Patient taking differently: Take 40 mg by mouth daily. 05/15/24  Yes Laurita Pillion, MD  insulin  glargine-yfgn (SEMGLEE ) 100 UNIT/ML injection Inject 0.08 mLs (8 Units total) into the skin daily. 05/14/24  Yes Laurita Pillion, MD  levothyroxine  (SYNTHROID ) 75 MCG tablet Take 75 mcg by mouth daily before breakfast.   Yes [provider]  loratadine  (CLARITIN ) 10 MG tablet Take 10 mg by mouth daily.   Yes [provider]  losartan  (COZAAR ) 50 MG tablet Take 50 mg by mouth daily. 06/25/24  Yes [provider]  lovastatin (MEVACOR) 10 MG tablet TAKE 1 TABLET ONCE A DAY ORALLY 10/20/17  Yes [provider]  metoprolol  tartrate (LOPRESSOR )  25 MG tablet Take 1 tablet (25 mg total) by mouth 2 (two) times daily. 05/14/24  Yes Zhang, Dekui, MD  Multiple Vitamin (MULTIVITAMIN WITH MINERALS) TABS tablet Take 1 tablet by mouth daily. 02/14/24  Yes Awanda City, MD  nystatin powder Apply 1 Application topically 2 (two) times daily. Apply Nystatin powder to red area under right breast and place ABD pad between breast and chest twice daily   Yes [provider]  Omega-3 Fatty Acids (FISH OIL) 500 MG CAPS Take 1,000 mg by mouth daily.   Yes [provider]  ondansetron  (ZOFRAN -ODT) 4 MG disintegrating tablet Take 4 mg by mouth every 8 (eight) hours as needed for nausea or vomiting.   Yes [provider]  phenol (CHLORASEPTIC) 1.4 % LIQD Use as directed 1 spray in the mouth or throat every 2 (two) hours as needed for throat irritation / pain.   Yes [provider]  polyethylene glycol (MIRALAX  / GLYCOLAX ) 17 g packet Take 17  g by mouth daily.   Yes [provider]  traMADol (ULTRAM) 50 MG tablet Take 50 mg by mouth as needed for moderate pain (pain score 4-6).   Yes [provider]  triamcinolone  ointment (KENALOG ) 0.5 % Apply 1 Application topically 2 (two) times daily. 06/14/24  Yes Scoggins, Amber, NP  trolamine salicylate (ASPERCREME) 10 % cream Apply 1 Application topically 3 (three) times daily. Apply to right knee three times a day.   Yes [provider]  feeding supplement (ENSURE ENLIVE / ENSURE PLUS) LIQD Take 237 mLs by mouth 2 (two) times daily between meals. 02/13/24   Awanda City, MD  ipratropium (ATROVENT HFA) 17 MCG/ACT inhaler Inhale 2 puffs into the lungs as needed for wheezing. Patient not taking: Reported on 06/27/2024    [provider]    Scheduled Meds:  amiodarone   200 mg Oral Daily   apixaban   5 mg Oral BID   calcium carbonate  1,000 mg of elemental calcium Oral Q breakfast   cephALEXin  500 mg Oral Q12H   digoxin   0.0625 mg Oral Daily   feeding supplement   237 mL Oral BID BM   insulin  aspart  0-9 Units Subcutaneous TID WC   insulin  glargine  5 Units Subcutaneous QHS   levothyroxine   75 mcg Oral Q0600   multivitamin with minerals  1 tablet Oral Daily   pravastatin   10 mg Oral q1800   Continuous Infusions:  PRN Meds: acetaminophen , fluticasone , ondansetron , polyethylene glycol, traMADol  Allergies:    Allergies  Allergen Reactions   Penicillins Rash    Social History:   Social History   Socioeconomic History   Marital status: Widowed    Spouse name: Not on file   Number of children: 1   Years of education: Not on file   Highest education level: 10th grade  Occupational History   Not on file  Tobacco Use   Smoking status: Never   Smokeless tobacco: Never  Vaping Use   Vaping status: Not on file  Substance and Sexual Activity   Alcohol use: Never   Drug use: Never   Sexual activity: Not Currently  Other Topics Concern   Not on file  Social History Narrative   Not on file   Social Drivers of Health   Financial Resource Strain: Low Risk  (09/05/2021)   Overall Financial Resource Strain (CARDIA)    Difficulty of Paying Living Expenses: Not hard at all  Food Insecurity: No Food Insecurity (06/27/2024)   Hunger Vital Sign    Worried About Running Out of Food in the Last Year: Never true    Ran Out of Food in the Last Year: Never true  Transportation Needs: No Transportation Needs (06/27/2024)   PRAPARE - Administrator, Civil Service (Medical): No    Lack of Transportation (Non-Medical): No  Physical Activity: Inactive (09/05/2021)   Exercise Vital Sign    Days of Exercise per Week: 0 days    Minutes of Exercise per Session: 0 min  Stress: No Stress Concern Present (09/05/2021)   [employer] of Occupational Health - Occupational Stress Questionnaire    Feeling of Stress : Not at all  Social Connections: Patient Unable To Answer (06/27/2024)   Social Connection and Isolation Panel    Frequency of  Communication with Friends and Family: Patient unable to answer    Frequency of Social Gatherings with Friends and Family: Patient unable to answer    Attends Religious Services: Patient unable  to answer    Active Member of Clubs or Organizations: Patient unable to answer    Attends Club or Organization Meetings: Patient unable to answer    Marital Status: Patient unable to answer  Intimate Partner Violence: Not At Risk (06/27/2024)   Humiliation, Afraid, Rape, and Kick questionnaire    Fear of Current or Ex-Partner: No    Emotionally Abused: No    Physically Abused: No    Sexually Abused: No    Family History:    Family History  Problem Relation Age of Onset   Asthma Mother    Kidney disease Father      ROS:  Please see the history of present illness.  Review of Systems  Constitutional:  Positive for malaise/fatigue.  Cardiovascular:  Positive for leg swelling.  Neurological:  Positive for dizziness and weakness.    All other ROS reviewed and negative.     Physical Exam/Data: Vitals:   06/28/24 0730 06/28/24 1447 06/28/24 2004 06/29/24 0811  BP: (!) 108/54 97/65 (!) 113/54 (!) 120/45  Pulse: 66 (!) 40 (!) 41 (!) 42  Resp: 16 18 16 16   Temp: 98.2 F (36.8 C) 97.9 F (36.6 C) 97.8 F (36.6 C) 97.8 F (36.6 C)  TempSrc: Oral Oral    SpO2: 98% 99% 99% 100%  Weight:      Height:        Intake/Output Summary (Last 24 hours) at 06/29/2024 1032 Last data filed at 06/29/2024 0600 Gross per 24 hour  Intake 680 ml  Output 200 ml  Net 480 ml      06/27/2024    9:49 AM 06/14/2024    1:31 PM 05/09/2024    4:43 AM  Last 3 Weights  Weight (lbs) 163 lb 12.8 oz 163 lb 12.8 oz 175 lb 8 oz  Weight (kg) 74.299 kg 74.299 kg 79.606 kg     Body mass index is 27.26 kg/m.  General:  Frail, chronically ill-appearing HEENT: normal Neck: no JVD Vascular: No carotid bruits; Distal pulses 2+ bilaterally Cardiac:  normal S1, S2; RRR; I/VI systolic murmur  RUSB Lungs:  clear upper  lobes with bibasilar crackles in the bases to auscultation bilaterally, respirations are unlabored at rest on room air Abd: soft, nontender, no hepatomegaly  Ext: Trace pretibial woody appearance edema Musculoskeletal:  No deformities, BUE and BLE strength normal and equal Skin: warm and dry  Neuro:  CNs 2-12 intact, no focal abnormalities noted Psych:  Normal affect   EKG:  The EKG was personally reviewed and demonstrates:  3:1 a flutter with large amount of artifact  Telemetry:  Telemetry was personally reviewed and demonstrates: Sinus bradycardia with a 2.34-second pause noted on telemetry  Relevant CV Studies:  2D echo 01/29/2024: 1. Left ventricular ejection fraction, by estimation, is 25 to 30%. The  left ventricle has severely decreased function. The left ventricle  demonstrates global hypokinesis with severe hypokinesis of teh basal to  mid anterior/anteroseptal wall (apical  region not well visualized). The left ventricular internal cavity size was  mildly dilated. Left ventricular diastolic parameters are indeterminate.   2. Right ventricular systolic function is normal. The right ventricular  size is normal. There is normal pulmonary artery systolic pressure. The  estimated right ventricular systolic pressure is 34.4 mmHg.   3. The mitral valve is normal in structure. Mild to moderate mitral valve  regurgitation. No evidence of mitral stenosis. Moderate mitral annular  calcification.   4. The aortic valve is normal in structure. There  is moderate  calcification of the aortic valve. Aortic valve regurgitation is not  visualized. Aortic valve sclerosis/calcification is present, unable to  exclude stenosis (gradient not measured.   5. The inferior vena cava is normal in size with greater than 50%  respiratory variability, suggesting right atrial pressure of 3 mmHg.  Laboratory Data: High Sensitivity Troponin:   Recent Labs  Lab 06/27/24 0956 06/27/24 1709  TROPONINIHS 14  16     Chemistry Recent Labs  Lab 06/27/24 0956 06/27/24 0957 06/27/24 1951 06/28/24 0412 06/29/24 0355  NA 134*  --   --  136 133*  K 2.6*  --  3.3* 3.5 4.0  CL 93*  --   --  102 102  CO2 19*  --   --  23 23  GLUCOSE 128*  --   --  96 106*  BUN 13  --   --  12 22  CREATININE 1.16*  --   --  1.09* 1.13*  CALCIUM 8.3*  --   --  7.7* 8.3*  MG  --  1.6*  --  1.8 2.4  GFRNONAA 47*  --   --  51* 49*  ANIONGAP 22*  --   --  11 8    Recent Labs  Lab 06/27/24 0956 06/29/24 0355  PROT 6.4*  --   ALBUMIN 3.0* 2.6*  AST 29  --   ALT 18  --   ALKPHOS 39  --   BILITOT 1.7*  --    Lipids No results for input(s): CHOL, TRIG, HDL, LABVLDL, LDLCALC, CHOLHDL in the last 168 hours.  Hematology Recent Labs  Lab 06/27/24 0956 06/28/24 0412  WBC 8.6 8.5  RBC 4.31 3.64*  HGB 12.3 10.8*  HCT 38.0 31.8*  MCV 88.2 87.4  MCH 28.5 29.7  MCHC 32.4 34.0  RDW 14.3 14.5  PLT 260 276   Thyroid   Recent Labs  Lab 06/27/24 0956 06/27/24 1055  TSH 1.724 1.731  FREET4 2.07*  --     BNP Recent Labs  Lab 06/27/24 0957  BNP 162.7*    DDimer No results for input(s): DDIMER in the last 168 hours.  Radiology/Studies:  CT Cervical Spine Wo Contrast Result Date: 06/27/2024 EXAM: CT CERVICAL SPINE WITHOUT CONTRAST 06/27/2024 11:43:07 AM TECHNIQUE: CT of the cervical spine was performed without the administration of intravenous contrast. Multiplanar reformatted images are provided for review. Automated exposure control, iterative reconstruction, and/or weight based adjustment of the mA/kV was utilized to reduce the radiation dose to as low as reasonably achievable. COMPARISON: CT of the cervical spine dated 01/03/2018. CLINICAL HISTORY: Neck trauma (Age >= 65y). Pt brought in by EMS from home for AMS/confusion. Per family, pt kicked them out of the house the other day. Family cleaned the house when they left. Today the house is trashed and pt is accusing family of moving things so  she cannot find them. FINDINGS: CERVICAL SPINE: BONES AND ALIGNMENT: No acute fracture or traumatic malalignment. There is incomplete fusion of the atlantoaxial joint. There is incomplete fusion of the facet joints throughout the cervical spine, except at C6-C7. At C6-C7, there is slight degenerative anterolisthesis. DEGENERATIVE CHANGES: At C6-C7, there is moderate bilateral facet arthrosis. Otherwise, no significant degenerative changes. SOFT TISSUES: No prevertebral soft tissue swelling. IMPRESSION: 1. No acute abnormality of the cervical spine related to the reported neck trauma. 2. Degenerative changes at C6-7 with slight anterolisthesis and moderate bilateral facet arthrosis. Electronically signed by: Evalene Coho MD 06/27/2024 11:58 AM EDT  RP Workstation: [id number]   CT HEAD WO CONTRAST ( ) Result Date: 06/27/2024 EXAM: CT HEAD WITHOUT CONTRAST 06/27/2024 11:43:07 AM TECHNIQUE: CT of the head was performed without the administration of intravenous contrast. Automated exposure control, iterative reconstruction, and/or weight based adjustment of the mA/kV was utilized to reduce the radiation dose to as low as reasonably achievable. COMPARISON: None available. CLINICAL HISTORY: Head trauma, minor (Age >= 65y). Pt brought in by EMS from home for AMS/confusion. Per family, pt kicked them out of the house the other day. Family cleaned the house when they left. Today the house is trashed and pt is accusing family of moving things so she cannot find them. FINDINGS: BRAIN AND VENTRICLES: No acute hemorrhage. No evidence of acute infarct. No hydrocephalus. No extra-axial collection. No mass effect or midline shift. Age-related atrophy. Mild periventricular white matter disease. Calcification within the carotid siphons. ORBITS: No acute abnormality. The patient is status post bilateral lens replacement. SINUSES: No acute abnormality. SOFT TISSUES AND SKULL: No acute soft tissue abnormality. No skull  fracture. IMPRESSION: 1. No acute intracranial abnormality. 2. Age-related cerebral atrophy and mild chronic microvascular white matter disease. Electronically signed by: Evalene Coho MD 06/27/2024 11:54 AM EDT RP Workstation: [id number]   DG Chest Portable 1 View Result Date: 06/27/2024 EXAM: 1 VIEW(S) XRAY OF THE CHEST 06/27/2024 10:51:36 AM COMPARISON: 05/09/2024 CLINICAL HISTORY: AMS. Pt brought in by EMS from home for AMS/confusion. Per family, pt kicked them out of the house the other day. Family cleaned the house when they left. Today the house is trashed and pt is accusing family of moving things so she cannot find them. FINDINGS: LUNGS AND PLEURA: There are mildly prominent interstitial markings present bilaterally. There has been interval improved aeration of the left lung base. No focal pulmonary opacity. No pulmonary edema. No pleural effusion. No pneumothorax. HEART AND MEDIASTINUM: Mild cardiomegaly, stable. Aortic atherosclerosis. BONES AND SOFT TISSUES: No acute osseous abnormality. IMPRESSION: 1. Mildly prominent bilateral interstitial markings. 2. Interval improved aeration of the left lung base. 3. Mild cardiomegaly, stable. Electronically signed by: Evalene Coho MD 06/27/2024 11:14 AM EDT RP Workstation: [id number]     Assessment and Plan: Asymptomatic bradycardia -Presented with heart rates in the 60s and 70s -Previously admitted to the hospital in August with atrial tachycardia versus atypical atrial flutter treated with metoprolol , dig, and amiodarone  -Metoprolol 's last dose was given on 10/6 at 8:20 AM 25 mg and was subsequently discontinued -Digoxin  and amiodarone  were discontinued today -Dig level was less than 0.6 -2.34-second pause noted on telemetry -Patient currently remains asymptomatic sitting up in the recliner and carry on conversation -Normal TSH -Continue on telemetry monitoring awaiting washout of medications -May require ZIO monitor on discharge for  further evaluation of chronotropic competence  Persistent atrial fibrillation/atypical atrial flutter -Bradycardic heart rates noted on telemetry -Continued on apixaban  5 mg twice daily for CHA2DS2-VASc score of at least 6 for stroke prophylaxis -Continue with telemetry monitoring  Chronic HFrEF - Prior echocardiogram showed LVEF 25-30% presumed to be secondary to Takotsubo cardiomyopathy -Repeat limited echocardiogram showed LVEF of 30-35%, moderately enlarged RV, moderate to severe MR, mild to moderate TR -cardiac catheterization deferred due to critical illness, CABG, palliative care discussion -Relative hypotension led to the holding of GDMT - Given 20 mg of IV furosemide  question - Heart failure education - Daily BMP -Daily weights and I's and O's  Hypertension -Blood pressure 120/45 -PTA losartan  and diuretic remain on hold -Vital signs per unit protocol  Chronic kidney disease  stage III -Serum creatinine 1.13 -Remained stable at baseline -Monitor urine output -monitor/trend/replete electrolytes as needed -Avoid nephrotoxic agents  Type 2 diabetes -Continued on insulin  therapy - Ongoing management per IM   Risk Assessment/Risk Scores:       [place]  Heart Association (NYHA) Functional Class NYHA Class II  CHA2DS2-VASc Score = 6   This indicates a 9.7% annual risk of stroke. The patient's score is based upon: CHF History: 1 HTN History: 1 Diabetes History: 1 Stroke History: 0 Vascular Disease History: 0 Age Score: 2 Gender Score: 1        For questions or updates, please contact [facility] Please consult [email or web address] for contact info under      Signed, Samanthajo Payano, NP  06/29/2024 10:32 AM

## 2024-06-29 NOTE — Assessment & Plan Note (Signed)
Resolved. -Continue to monitor and replete as needed 

## 2024-06-29 NOTE — Assessment & Plan Note (Signed)
 Currently in sinus rhythm. - Continue home Eliquis ,  - Eliquis  and digoxin  is being held by cardiology today due to borderline bradycardia and pauses.

## 2024-06-29 NOTE — Assessment & Plan Note (Signed)
 Likely secondary to excessive laxative use, seems improving. -Holding excessive laxative Continue to monitor-

## 2024-06-29 NOTE — Assessment & Plan Note (Signed)
 Seems more like behavioral issue, was evaluated by psych and did not meet the criteria for inpatient behavioral services.  Patient likely have underlying significant dementia with behavioral issues but no proper diagnosis which need to be done as outpatient.  Currently appears to be at her baseline.

## 2024-06-30 ENCOUNTER — Observation Stay: Admit: 2024-06-30 | Discharge: 2024-06-30 | Disposition: A | Attending: Internal Medicine | Admitting: Internal Medicine

## 2024-06-30 ENCOUNTER — Observation Stay: Admit: 2024-06-30

## 2024-06-30 DIAGNOSIS — R4182 Altered mental status, unspecified: Secondary | ICD-10-CM | POA: Diagnosis not present

## 2024-06-30 DIAGNOSIS — E876 Hypokalemia: Secondary | ICD-10-CM | POA: Diagnosis not present

## 2024-06-30 DIAGNOSIS — R5381 Other malaise: Secondary | ICD-10-CM | POA: Diagnosis not present

## 2024-06-30 DIAGNOSIS — N390 Urinary tract infection, site not specified: Secondary | ICD-10-CM | POA: Diagnosis not present

## 2024-06-30 DIAGNOSIS — I428 Other cardiomyopathies: Secondary | ICD-10-CM

## 2024-06-30 DIAGNOSIS — I4819 Other persistent atrial fibrillation: Secondary | ICD-10-CM | POA: Diagnosis not present

## 2024-06-30 DIAGNOSIS — E119 Type 2 diabetes mellitus without complications: Secondary | ICD-10-CM | POA: Diagnosis not present

## 2024-06-30 DIAGNOSIS — I495 Sick sinus syndrome: Secondary | ICD-10-CM

## 2024-06-30 LAB — ECHOCARDIOGRAM LIMITED
Height: 65 in
S' Lateral: 2.8 cm
Weight: 2620.79 [oz_av]

## 2024-06-30 LAB — GLUCOSE, CAPILLARY
Glucose-Capillary: 110 mg/dL — ABNORMAL HIGH (ref 70–99)
Glucose-Capillary: 100 mg/dL — ABNORMAL HIGH (ref 70–99)
Glucose-Capillary: 83 mg/dL (ref 70–99)
Glucose-Capillary: 74 mg/dL (ref 70–99)

## 2024-06-30 MED ORDER — LOSARTAN POTASSIUM 50 MG PO TABS
50.0000 mg | ORAL_TABLET | Freq: Every day | ORAL | Status: DC
  Administered 2024-07-01 – 2024-07-02 (×2): 50 mg via ORAL
  Filled 2024-06-30 (×3): qty 1

## 2024-06-30 MED ORDER — FUROSEMIDE 10 MG/ML IJ SOLN
40.0000 mg | Freq: Once | INTRAMUSCULAR | Status: DC
Start: 2024-06-30 — End: 2024-07-02
  Filled 2024-06-30: qty 4

## 2024-06-30 MED ORDER — TORSEMIDE 20 MG PO TABS
20.0000 mg | ORAL_TABLET | Freq: Every day | ORAL | Status: DC
  Administered 2024-07-01 – 2024-07-02 (×2): 20 mg via ORAL
  Filled 2024-06-30 (×2): qty 1

## 2024-06-30 MED ORDER — TORSEMIDE 20 MG PO TABS: 20.0000 mg | ORAL_TABLET | Freq: Every day | ORAL | Status: DC

## 2024-06-30 NOTE — Progress Notes (Signed)
*  PRELIMINARY RESULTS* Echocardiogram 2D Echocardiogram has been performed.  Floydene Harder 06/30/2024, 2:38 PM

## 2024-06-30 NOTE — Progress Notes (Signed)
 Physical Therapy Treatment Patient Details Name: Tanya Frye MRN: [id number] DOB: Oct 22, 1940 Today's Date: 06/30/2024   History of Present Illness 83 y.o. female who comes in with concerns for altered mental status. Per family, pt was acting altered since coming back from the facility and has had UTIs previously that have caused her to act altered. Given recent delusions and paranoia family were concerned. PMH: COPD, CHF, HTN, T2DM, CKD, hypothyroidism, and A-fib on Eliquis .    PT Comments  Patient continues to be confused, internally distracted, and perseverative on events leading up to this hospital stay. She was in the chair on arrival and declined standing/ambulation due to foot pain. She participated with LE exercises for strengthening. Encouraged patient to elevate legs in the chair to help with edema management, however she declined. PT will continue to follow to maximize independence. Rehabilitation < 3 hours/day recommended after this hospital stay.    If plan is discharge home, recommend the following: A little help with walking and/or transfers;A little help with bathing/dressing/bathroom;Supervision due to cognitive status;Assistance with cooking/housework;Direct supervision/assist for medications management;Direct supervision/assist for financial management;Assist for transportation;Help with stairs or ramp for entrance   Can travel by private vehicle     No  Equipment Recommendations  None recommended by PT    Recommendations for Other Services       Precautions / Restrictions Precautions Precautions: Fall Recall of Precautions/Restrictions: Impaired Restrictions Weight Bearing Restrictions Per Provider Order: No     Mobility  Bed Mobility               General bed mobility comments: not assessed as patient sitting up on arrival and post session    Transfers                   General transfer comment: patient refused due to foot pain. she can  reposition herself in the chair with supervision. encouraged patient to elevate legs due to edema, however she declined stating it makes them worse    Ambulation/Gait                   Stairs             Wheelchair Mobility     Tilt Bed    Modified Rankin (Stroke Patients Only)       Balance Overall balance assessment: Needs assistance Sitting-balance support: Feet supported, No upper extremity supported Sitting balance-Leahy Scale: Good                                      Communication Communication Communication: Impaired Factors Affecting Communication: Hearing impaired (wears hearing aides and glasses which apparently are not here. recommend for family to bring if able)  Cognition Arousal: Alert Behavior During Therapy: Anxious   PT - Cognitive impairments: Orientation, Memory, Problem solving, Safety/Judgement   Orientation impairments: Situation                   PT - Cognition Comments: decreased awareness for need for physical assistance and acute medical issues Following commands: Impaired Following commands impaired: Follows one step commands with increased time    Cueing Cueing Techniques: Verbal cues, Tactile cues  Exercises General Exercises - Lower Extremity Ankle Circles/Pumps: AAROM, Strengthening, Both, 10 reps, Seated Long Arc Quad: AAROM, Strengthening, Both, 10 reps, Seated Hip ABduction/ADduction: AAROM, Strengthening, Both, 10 reps, Seated Straight Leg Raises: AAROM, Strengthening, Both, 10  reps, Seated Other Exercises Other Exercises: cues for exercise technique for strengthening. patient could benefit from routine exercise/HEP however patient will be likely unable to complete independently given cognitive deficits    General Comments        Pertinent Vitals/Pain Pain Assessment Pain Assessment: Faces Faces Pain Scale: Hurts little more Pain Location: left knee, bottom of both feet Pain  Descriptors / Indicators: Discomfort Pain Intervention(s): Repositioned    Home Living                          Prior Function            PT Goals (current goals can now be found in the care plan section) Acute Rehab PT Goals Patient Stated Goal: to be discharged PT Goal Formulation: With family Time For Goal Achievement: 07/12/24 Potential to Achieve Goals: Fair Progress towards PT goals: Progressing toward goals    Frequency    Min 2X/week      PT Plan      Co-evaluation              AM-PAC PT 6 Clicks Mobility   Outcome Measure  Help needed turning from your back to your side while in a flat bed without using bedrails?: A Little Help needed moving from lying on your back to sitting on the side of a flat bed without using bedrails?: A Lot Help needed moving to and from a bed to a chair (including a wheelchair)?: A Little Help needed standing up from a chair using your arms (e.g., wheelchair or bedside chair)?: A Little Help needed to walk in hospital room?: A Little Help needed climbing 3-5 steps with a railing? : A Lot 6 Click Score: 16    End of Session   Activity Tolerance: Patient limited by fatigue Patient left: in chair;with call bell/phone within reach;with chair alarm set Nurse Communication: Mobility status PT Visit Diagnosis: Muscle weakness (generalized) (M62.81);Unsteadiness on feet (R26.81)     Time: 9076-9063 PT Time Calculation (min) (ACUTE ONLY): 13 min  Charges:    $Therapeutic Exercise: 8-22 mins PT General Charges $$ ACUTE PT VISIT: 1 Visit                    Randine Essex, PT, MPT    Randine LULLA Essex 06/30/2024, 9:52 AM

## 2024-06-30 NOTE — Progress Notes (Signed)
 Patient c/o pain to right side, however refuses medication and even the bengay cream I explained to her. She really wants to see a doctor and is asking for a scan on that side. Her abdomen/back is tender/firm especially on that right side.

## 2024-06-30 NOTE — Progress Notes (Signed)
 Rounding Note   Patient Name: Tanya Frye Date of Encounter: 06/30/2024  [facility] Cardiologist: Dr. zenaida  Subjective Numerous complaints on today's rounds, hip pain Sitting up in a chair Telemetry reviewed heart rate 70 to 80 bpm  Scheduled Meds:  apixaban   5 mg Oral BID   calcium carbonate  1,000 mg of elemental calcium Oral Q breakfast   cephALEXin  500 mg Oral Q12H   feeding supplement  237 mL Oral BID BM   furosemide   40 mg Intravenous Once   insulin  aspart  0-9 Units Subcutaneous TID WC   insulin  glargine  5 Units Subcutaneous QHS   levothyroxine   75 mcg Oral Q0600   losartan   50 mg Oral Daily   multivitamin with minerals  1 tablet Oral Daily   pravastatin   10 mg Oral q1800   [START ON 07/01/2024] torsemide   20 mg Oral Daily   Continuous Infusions:  PRN Meds: acetaminophen , fluticasone , ondansetron , polyethylene glycol, traMADol   Vital Signs  Vitals:   06/29/24 2112 06/30/24 0318 06/30/24 0809 06/30/24 1355  BP: 109/65 (!) 133/52 (!) 151/52 (!) 145/65  Pulse: (!) 57 71 81 73  Resp: 20 20    Temp: 97.8 F (36.6 C) 97.6 F (36.4 C) 97.6 F (36.4 C) 97.6 F (36.4 C)  TempSrc: Oral Oral Oral Oral  SpO2: 100% 99% 95% 95%  Weight:      Height:        Intake/Output Summary (Last 24 hours) at 06/30/2024 1521 Last data filed at 06/30/2024 1300 Gross per 24 hour  Intake 0 ml  Output --  Net 0 ml      06/27/2024    9:49 AM 06/14/2024    1:31 PM 05/09/2024    4:43 AM  Last 3 Weights  Weight (lbs) 163 lb 12.8 oz 163 lb 12.8 oz 175 lb 8 oz  Weight (kg) 74.299 kg 74.299 kg 79.606 kg      Telemetry Atrial fibrillation rate 70-80- Personally Reviewed  ECG   - Personally Reviewed  Physical Exam  GEN: No acute distress.   Neck: No JVD Cardiac: Irregularly irregular, no murmurs, rubs, or gallops.  Respiratory: Clear to auscultation bilaterally. GI: Soft, nontender, non-distended  MS: No edema; No deformity. Neuro:  Nonfocal  Psych: Normal  affect   Labs High Sensitivity Troponin:   Recent Labs  Lab 06/27/24 0956 06/27/24 1709  TROPONINIHS 14 16     Chemistry Recent Labs  Lab 06/27/24 0956 06/27/24 0957 06/27/24 1951 06/28/24 0412 06/29/24 0355  NA 134*  --   --  136 133*  K 2.6*  --  3.3* 3.5 4.0  CL 93*  --   --  102 102  CO2 19*  --   --  23 23  GLUCOSE 128*  --   --  96 106*  BUN 13  --   --  12 22  CREATININE 1.16*  --   --  1.09* 1.13*  CALCIUM 8.3*  --   --  7.7* 8.3*  MG  --  1.6*  --  1.8 2.4  PROT 6.4*  --   --   --   --   ALBUMIN 3.0*  --   --   --  2.6*  AST 29  --   --   --   --   ALT 18  --   --   --   --   ALKPHOS 39  --   --   --   --  BILITOT 1.7*  --   --   --   --   GFRNONAA 47*  --   --  51* 49*  ANIONGAP 22*  --   --  11 8    Lipids No results for input(s): CHOL, TRIG, HDL, LABVLDL, LDLCALC, CHOLHDL in the last 168 hours.  Hematology Recent Labs  Lab 06/27/24 0956 06/28/24 0412  WBC 8.6 8.5  RBC 4.31 3.64*  HGB 12.3 10.8*  HCT 38.0 31.8*  MCV 88.2 87.4  MCH 28.5 29.7  MCHC 32.4 34.0  RDW 14.3 14.5  PLT 260 276   Thyroid   Recent Labs  Lab 06/27/24 0956 06/27/24 1055  TSH 1.724 1.731  FREET4 2.07*  --     BNP Recent Labs  Lab 06/27/24 0957  BNP 162.7*    DDimer No results for input(s): DDIMER in the last 168 hours.   Radiology  No results found.  Cardiac Studies   Patient Profile   83 y.o. 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   Assessment & Plan  atrial fibrillation with slow ventricular response Noted to be in atrial fibrillation with slow ventricular response Digoxin , metoprolol , amiodarone  held with improving heart rate Rate now running 70-80 up from the 30s Continue Eliquis  5 twice daily  Chronic systolic CHF Received  dose IV Lasix  yesterday Continues to have woody lower extremity edema Recommend additional dose IV Lasix  this morning with transition to torsemide  20 daily tomorrow Beta-blocker on hold given bradycardia Continue losartan  50 daily  Hyponatremia Sodium 133, appears chronically low  Altered mental status Needs outpatient follow-up with psychiatry  [facility] will sign off.   Medication Recommendations: No changes Other recommendations (labs, testing, etc): No further testing Follow up as an outpatient: Outpatient follow-up in cardiology clinic Dr. Zenaida  Signed, Fahed Morten, MD  06/30/2024, 3:21 PM

## 2024-06-30 NOTE — TOC Progression Note (Addendum)
 Transition of Care [facility]) - Progression Note    Patient Details  Name: Tanya Frye MRN: [id number] Date of Birth: 26-Jun-1941  [facility]) CM/SW Contact  Corean ONEIDA Haddock, RN Phone Number: 06/30/2024, 10:45 AM  Clinical Narrative:     Noted patient with recs for SNF Patient is under observation status and will not have a qualifying stay Per Bamboo ping Patient used 130 days at [facility] from 02/13/24-06/22/24  VM left for grandson Darryle to discuss disposition   Update:  received return call from grandson [place].  He is in agreement to pursue private pay SNF.  He states he spoke with Corean at [facility] on Monday, and would like to see if they would be an option.  Bed search initiated.   Per Corean with [facility], RN Olam to complete assessment today   Expected Discharge Plan: Skilled Nursing Facility Barriers to Discharge: Continued Medical Work up               Expected Discharge Plan and Services In-house Referral: Clinical Social Work Discharge Planning Services: CM Consult (Discharge planning)   Living arrangements for the past 2 months: Single Family Home                                       Social Drivers of Health (SDOH) Interventions SDOH Screenings   Food Insecurity: No Food Insecurity (06/27/2024)  Housing: Unknown (06/27/2024)  Transportation Needs: No Transportation Needs (06/27/2024)  Utilities: Patient Unable To Answer (06/27/2024)  Alcohol Screen: Low Risk  (09/05/2021)  Depression (PHQ2-9): Low Risk  (06/14/2024)  Financial Resource Strain: Low Risk  (09/05/2021)  Physical Activity: Inactive (09/05/2021)  Social Connections: Patient Unable To Answer (06/27/2024)  Stress: No Stress Concern Present (09/05/2021)  Tobacco Use: Low Risk  (06/27/2024)    Readmission Risk Interventions     No data to display

## 2024-06-30 NOTE — Progress Notes (Signed)
 Patient removed IV and refused to have another placed.  She has refused afternoon medication.  Oral intake minimal, blood sugar trending down, encouraged patient to drink juice.

## 2024-06-30 NOTE — Progress Notes (Signed)
 Progress Note   Patient: Tanya Frye [name]:[id number] DOB: 1941/04/22 DOA: 06/27/2024     0 DOS: the patient was seen and examined on 06/30/2024   Brief hospital course: Partly taken from H&P and prior notes.  Tanya Frye is a 83 y.o. female with medical history significant of COPD, chronic HFrEF, HTN, IDDM, PAF on Eliquis , CKD stage IIIa, was brought in by family member for altered mentation.   Patient was recently hospitalized in late August for pneumonia and cardiogenic shock, and she was discharged to nursing home.  Monday, patient was brought home and she found  they moved everything to different places.  That makes her more aggressive behavior when she was refusing medications, throwing away different stuff.  She was also having some diarrhea and frequent accidents of loose bowel movements over the past 3 to 4 days.  Feeling weak, apparently she was taking a whole lot of stool softeners and laxative.  Decreased p.o. intake for the past 2 days.  On presentation vital stable, labs with potassium of 2.6 and magnesium  of 1.6.  TSH normal, UA with significant proteinuria, ketonuria, positive nitrite and leukocytes, urine cultures were ordered.  UDS positive for benzodiazepine.  Mentation appears to be at baseline.  For family concern that she had an history of suicidal ideation in the past, psych was consulted.  Patient was unable to participate in a very meaningful history but she did not meet criteria for inpatient psychiatric admission and remain low risk of self-harm.  10/6: Vital stable, subtherapeutic digoxin  level likely secondary to being noncompliant, mild hypocalcemia so started on p.o. supplement. PT and OT recommending SNF.  Seems like significant underlying dementia but no proper evaluation or diagnosis.  Family is willing to pay out-of-pocket for rehab.  Later monitor concern of bradycardia, heart rate mostly in 30s with couple of pauses.  Patient remained asymptomatic.   Discontinuing home metoprolol  and monitoring.  10/7: Persistent borderline bradycardia, home metoprolol  and amiodarone  both were discontinued by cardiology.  Urine cultures with Klebsiella pneumonia-antibiotics switched with Keflex based on susceptibility results.  Still pending placement.  10/8: Bradycardia resolved, cardiology ordered repeat echo-done with pending results.  IV Lasix  was ordered but she lost IV and does not want to be replaced, ordered torsemide  20 mg daily. Awaiting placement.  Assessment and Plan: * Hypokalemia Resolved -Continue to monitor and replete as needed  Hypomagnesemia Resolved with repletion  AMS (altered mental status) Seems more like behavioral issue, was evaluated by psych and did not meet the criteria for inpatient behavioral services.  Patient likely have underlying significant dementia with behavioral issues but no proper diagnosis which need to be done as outpatient.  Currently appears to be at her baseline.  Diarrhea Likely secondary to excessive laxative use, seems improving. -Holding excessive laxative Continue to monitor-  Chronic HFrEF (heart failure with reduced ejection fraction) (HCC) Prior echo done in August 2025 with EF of 30 to 35%, moderate MR and AS Clinically appears euvolemic with BNP of 162 - Initially home diuretics were held, starting on torsemide  20 mg daily, received 1 dose of IV Lasix  by cardiology on 10/7. - Home digoxin  and metoprolol  is being held by cardiology due to borderline bradycardia  Hypertension Blood pressure mildly elevated -Home losartan  and torsemide  restarted -Monitor blood pressure and we will resume home medications as appropriate  CKD (chronic kidney disease), stage III (HCC) History of CKD stage III A Renal function seems stable and at baseline. - Monitor renal function -Avoid nephrotoxins  UTI (urinary tract infection) UA concerning for UTI with positive nitrites, leukocytes and bacteria.   Unable to explain any symptoms. Pending urine cultures,  - Continue with ceftriaxone  -Follow-up final urine culture results  Paroxysmal atrial fibrillation (HCC) Currently in sinus rhythm. - Continue home Eliquis ,  - Eliquis  and digoxin  is being held by cardiology today due to borderline bradycardia and pauses.  Diabetes mellitus without complication (HCC) CBG within goal. - Patient takes Lantus  8 units at home -Starting Semglee  at 5 units at bedtime -Continue with sensitive SSI  Hypothyroidism - Continue with home Synthroid   Physical deconditioning Recently discharged from SNF, physical therapy reevaluated her and recommending rehab.  Family is willing to pay out-of-pocket charges. -TOC for placement   Subjective: Patient was sitting in chair when seen today.  Worsening lower extremity edema, no other concern.  Physical Exam: Vitals:   06/29/24 2112 06/30/24 0318 06/30/24 0809 06/30/24 1355  BP: 109/65 (!) 133/52 (!) 151/52 (!) 145/65  Pulse: (!) 57 71 81 73  Resp: 20 20    Temp: 97.8 F (36.6 C) 97.6 F (36.4 C) 97.6 F (36.4 C) 97.6 F (36.4 C)  TempSrc: Oral Oral Oral Oral  SpO2: 100% 99% 95% 95%  Weight:      Height:       General.  Frail, very hard of hearing elderly lady, in no acute distress. Pulmonary.  Lungs clear bilaterally, normal respiratory effort. CV.  Regular rate and rhythm, no JVD, rub or murmur. Abdomen.  Soft, nontender, nondistended, BS positive. CNS.  Alert and oriented .  No focal neurologic deficit. Extremities.  1+ LE edema, signs of chronic venous congestion, pulses intact and symmetrical.   Data Reviewed: Prior data reviewed  Family Communication: Talked with Grandson on phone.  Disposition: Status is: Observation The patient remains OBS appropriate and will d/c before 2 midnights.  Planned Discharge Destination: Skilled nursing facility  DVT prophylaxis.  Eliquis  Time spent: 50 minutes  This record has been created using  Conservation officer, historic buildings. Errors have been sought and corrected,but may not always be located. Such creation errors do not reflect on the standard of care.   Author: Amaryllis Dare, MD 06/30/2024 3:00 PM  For on call review [email or web address].
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
## 2024-07-01 DIAGNOSIS — E119 Type 2 diabetes mellitus without complications: Secondary | ICD-10-CM | POA: Diagnosis not present

## 2024-07-01 DIAGNOSIS — R5381 Other malaise: Secondary | ICD-10-CM | POA: Diagnosis not present

## 2024-07-01 DIAGNOSIS — R4182 Altered mental status, unspecified: Secondary | ICD-10-CM | POA: Diagnosis not present

## 2024-07-01 DIAGNOSIS — E876 Hypokalemia: Secondary | ICD-10-CM | POA: Diagnosis not present

## 2024-07-01 DIAGNOSIS — I4819 Other persistent atrial fibrillation: Secondary | ICD-10-CM | POA: Diagnosis not present

## 2024-07-01 LAB — BASIC METABOLIC PANEL WITH GFR
Anion gap: 9 (ref 5–15)
BUN: 15 mg/dL (ref 8–23)
CO2: 25 mmol/L (ref 22–32)
Calcium: 8.5 mg/dL — ABNORMAL LOW (ref 8.9–10.3)
Chloride: 99 mmol/L (ref 98–111)
Creatinine, Ser: 1.08 mg/dL — ABNORMAL HIGH (ref 0.44–1.00)
GFR, Estimated: 51 mL/min — ABNORMAL LOW (ref >60)
Glucose, Bld: 73 mg/dL (ref 70–99)
Potassium: 3.7 mmol/L (ref 3.5–5.1)
Sodium: 133 mmol/L — ABNORMAL LOW (ref 135–145)

## 2024-07-01 LAB — GLUCOSE, CAPILLARY
Glucose-Capillary: 88 mg/dL (ref 70–99)
Glucose-Capillary: 145 mg/dL — ABNORMAL HIGH (ref 70–99)
Glucose-Capillary: 98 mg/dL (ref 70–99)
Glucose-Capillary: 110 mg/dL — ABNORMAL HIGH (ref 70–99)

## 2024-07-01 NOTE — Plan of Care (Signed)

## 2024-07-01 NOTE — Progress Notes (Signed)
 Mobility Specialist - Progress Note   07/01/24 0953  Mobility  Activity Stood at bedside  Level of Assistance Standby assist, set-up cues, supervision of patient - no hands on  Assistive Device None  Distance Ambulated (ft) 2 ft  Activity Response Tolerated well  Mobility visit 1 Mobility  Mobility Specialist Start Time (ACUTE ONLY) 0931  Mobility Specialist Stop Time (ACUTE ONLY) 0943  Mobility Specialist Time Calculation (min) (ACUTE ONLY) 12 min   Pt seated in the recliner upon entry, utilizing RA. Pt STS x3 MinG-SBA---- during third STS Pt became increasingly aggravated promoting a return to the recliner. Pt left seated with alarm set and needs within reach.  America Silvan Mobility Specialist 07/01/24 9:56 AM

## 2024-07-01 NOTE — Assessment & Plan Note (Signed)
 UA concerning for UTI with positive nitrites, leukocytes and bacteria.  Unable to explain any symptoms.  Urine cultures with Klebsiella pneumonia, resistant to ampicillin and nitrofurantoin -Ceftriaxone  switched with Keflex to complete a total of 5-day course

## 2024-07-01 NOTE — Progress Notes (Signed)
 Occupational Therapy Treatment Patient Details Name: Tanya Frye MRN: [id number] DOB: [birth year] Today's Date: 07/01/2024   History of present illness 83 y.o. female who comes in with concerns for altered mental status. Per family, pt was acting altered since coming back from the facility and has had UTIs previously that have caused her to act altered. Given recent delusions and paranoia family were concerned. PMH: COPD, CHF, HTN, T2DM, CKD, hypothyroidism, and A-fib on Eliquis .   OT comments  Pt is seated in recliner on arrival. She is covered in blankets and continues to perseverate on family issues and being cold. Emotional at times stating they wouldn't let me speak to my grandson when he came this morning. Declined mobility attempts, but agreeable to seated grooming ADLs which she performed with set up assist. She is able to reposition herself in the recliner. Declined elevating BLEs. Edu on importance of activity to prevent weakness and maintain current abilitiles. Pt left in recliner with all needs in place and will cont to require skilled acute OT services to maximize her safety and IND to return to PLOF.       If plan is discharge home, recommend the following:  A little help with walking and/or transfers;A lot of help with bathing/dressing/bathroom;Direct supervision/assist for medications management;Supervision due to cognitive status;Direct supervision/assist for financial management;Assist for transportation;Assistance with cooking/housework;Help with stairs or ramp for entrance   Equipment Recommendations  Other (comment) (defer)    Recommendations for Other Services      Precautions / Restrictions Precautions Precautions: Fall Recall of Precautions/Restrictions: Impaired Restrictions Weight Bearing Restrictions Per Provider Order: No       Mobility Bed Mobility               General bed mobility comments: NT pt in recliner pre/post session    Transfers                    General transfer comment: pt refused stating she is too cold, provided more blankets and pt repositioned herself in the chair after performing seated oral care     Balance Overall balance assessment: Needs assistance Sitting-balance support: Feet supported, No upper extremity supported Sitting balance-Leahy Scale: Good                                     ADL either performed or assessed with clinical judgement   ADL Overall ADL's : Needs assistance/impaired     Grooming: Wash/dry face;Wash/dry hands;Oral care;Sitting;Cueing for sequencing Grooming Details (indicate cue type and reason): seated in recliner with set up assist provided                                    Extremity/Trunk Assessment              Vision       Perception     Praxis     Communication Communication Communication: Impaired Factors Affecting Communication: Hearing impaired (wears hearing aides and glasses which apparently are not here. recommend for family to bring if able)   Cognition Arousal: Alert Behavior During Therapy: Anxious               OT - Cognition Comments: Pt alert and oriented, cues to redirect to task, rambles and shares feels that her family is taking over things but not doing  it correctly                 Following commands: Impaired Following commands impaired: Follows one step commands with increased time      Cueing   Cueing Techniques: Verbal cues, Tactile cues  Exercises Other Exercises Other Exercises: Edu on importance of activity to prevent weakness and maintain current abilitiles. Pt very emotional and distracted/perseverating on family issues requiring redirection throughout session.    Shoulder Instructions       General Comments      Pertinent Vitals/ Pain       Pain Assessment Pain Assessment: No/denies pain Pain Intervention(s): Monitored during session  Home Living                                           Prior Functioning/Environment              Frequency  Min 2X/week        Progress Toward Goals  OT Goals(current goals can now be found in the care plan section)  Progress towards OT goals: Progressing toward goals  Acute Rehab OT Goals Patient Stated Goal: pt reports she wants to live with her grandson OT Goal Formulation: With patient Time For Goal Achievement: 07/12/24 Potential to Achieve Goals: Fair  Plan      Co-evaluation                 AM-PAC OT 6 Clicks Daily Activity     Outcome Measure   Help from another person eating meals?: None Help from another person taking care of personal grooming?: A Little Help from another person toileting, which includes using toliet, bedpan, or urinal?: A Little Help from another person bathing (including washing, rinsing, drying)?: A Little Help from another person to put on and taking off regular upper body clothing?: A Little Help from another person to put on and taking off regular lower body clothing?: A Little 6 Click Score: 19    End of Session    OT Visit Diagnosis: Other abnormalities of gait and mobility (R26.89);Muscle weakness (generalized) (M62.81);Other symptoms and signs involving cognitive function   Activity Tolerance Patient tolerated treatment well   Patient Left in chair;with call bell/phone within reach;with chair alarm set   Nurse Communication Mobility status        Time: 8870-8856 OT Time Calculation (min): 14 min  Charges: OT General Charges $OT Visit: 1 Visit OT Treatments $Self Care/Home Management : 8-22 mins  Sharron Petruska, OTR/L  07/01/24, 1:34 PM   Fred Franzen E Zaira Iacovelli 07/01/2024, 1:32 PM

## 2024-07-01 NOTE — TOC Progression Note (Signed)
 Transition of Care [facility]) - Progression Note    Patient Details  Name: Tanya Frye MRN: [id number] Date of Birth: Nov 27, 1940  [facility]) CM/SW Contact  Corean ONEIDA Haddock, RN Phone Number: 07/01/2024, 3:03 PM  Clinical Narrative:     Per Corean with [facility] they are having a meeting at 4 to review patient and will update CM if they are able to accept   Expected Discharge Plan: Skilled Nursing Facility Barriers to Discharge: Continued Medical Work up               Expected Discharge Plan and Services In-house Referral: Clinical Social Work Discharge Planning Services: CM Consult (Discharge planning)   Living arrangements for the past 2 months: Single Family Home                                       Social Drivers of Health (SDOH) Interventions SDOH Screenings   Food Insecurity: No Food Insecurity (06/27/2024)  Housing: Unknown (06/27/2024)  Transportation Needs: No Transportation Needs (06/27/2024)  Utilities: Patient Unable To Answer (06/27/2024)  Alcohol Screen: Low Risk  (09/05/2021)  Depression (PHQ2-9): Low Risk  (06/14/2024)  Financial Resource Strain: Low Risk  (09/05/2021)  Physical Activity: Inactive (09/05/2021)  Social Connections: Patient Unable To Answer (06/27/2024)  Stress: No Stress Concern Present (09/05/2021)  Tobacco Use: Low Risk  (06/27/2024)    Readmission Risk Interventions     No data to display

## 2024-07-01 NOTE — Progress Notes (Signed)
 Pt refused all meds and yelled at staff when staff tired to educate and encourage pt to take meds and drink juice to help with BG trending down.  Pt remained in chair most of night and had a nose bleed that self resolved.  Morning labs BG 73.  Provider aware.  Will continue to monitor pt.

## 2024-07-01 NOTE — Progress Notes (Signed)
 Progress Note   Patient: Tanya Frye FMW:[id number] DOB: 11-19-1940 DOA: 06/27/2024     0 DOS: the patient was seen and examined on 07/01/2024   Brief hospital course: Partly taken from H&P and prior notes.  Tanya Frye is a 83 y.o. female with medical history significant of COPD, chronic HFrEF, HTN, IDDM, PAF on Eliquis , CKD stage IIIa, was brought in by family member for altered mentation.   Patient was recently hospitalized in late August for pneumonia and cardiogenic shock, and she was discharged to nursing home.  Monday, patient was brought home and she found  they moved everything to different places.  That makes her more aggressive behavior when she was refusing medications, throwing away different stuff.  She was also having some diarrhea and frequent accidents of loose bowel movements over the past 3 to 4 days.  Feeling weak, apparently she was taking a whole lot of stool softeners and laxative.  Decreased p.o. intake for the past 2 days.  On presentation vital stable, labs with potassium of 2.6 and magnesium  of 1.6.  TSH normal, UA with significant proteinuria, ketonuria, positive nitrite and leukocytes, urine cultures were ordered.  UDS positive for benzodiazepine.  Mentation appears to be at baseline.  For family concern that she had an history of suicidal ideation in the past, psych was consulted.  Patient was unable to participate in a very meaningful history but she did not meet criteria for inpatient psychiatric admission and remain low risk of self-harm.  10/6: Vital stable, subtherapeutic digoxin  level likely secondary to being noncompliant, mild hypocalcemia so started on p.o. supplement. PT and OT recommending SNF.  Seems like significant underlying dementia but no proper evaluation or diagnosis.  Family is willing to pay out-of-pocket for rehab.  Later monitor concern of bradycardia, heart rate mostly in 30s with couple of pauses.  Patient remained asymptomatic.   Discontinuing home metoprolol  and monitoring.  10/7: Persistent borderline bradycardia, home metoprolol  and amiodarone  both were discontinued by cardiology.  Urine cultures with Klebsiella pneumonia-antibiotics switched with Keflex based on susceptibility results.  Still pending placement.  10/8: Bradycardia resolved, cardiology ordered repeat echo-done with pending results.  IV Lasix  was ordered but she lost IV and does not want to be replaced, ordered torsemide  20 mg daily. Awaiting placement.  10/9: Hemodynamically stable, at time refusing meds and food and becoming agitated.  Still pending disposition, patient need to be a private pay as has used all of her rehab days.  Assessment and Plan: * Hypokalemia Resolved -Continue to monitor and replete as needed  Hypomagnesemia Resolved with repletion  AMS (altered mental status) Seems more like behavioral issue, was evaluated by psych and did not meet the criteria for inpatient behavioral services.  Patient likely have underlying significant dementia with behavioral issues but no proper diagnosis which need to be done as outpatient.  Currently appears to be at her baseline.  Diarrhea Likely secondary to excessive laxative use, seems improving. -Holding excessive laxative Continue to monitor-  Chronic HFrEF (heart failure with reduced ejection fraction) (HCC) Prior echo done in August 2025 with EF of 30 to 35%, moderate MR and AS Clinically appears euvolemic with BNP of 162 - Initially home diuretics were held, starting on torsemide  20 mg daily, received 1 dose of IV Lasix  by cardiology on 10/7. - Home digoxin  and metoprolol  is being held by cardiology due to borderline bradycardia  Hypertension Blood pressure mildly elevated -Home losartan  and torsemide  restarted -Monitor blood pressure and we will  resume home medications as appropriate  CKD (chronic kidney disease), stage III (HCC) History of CKD stage III A Renal function  seems stable and at baseline. - Monitor renal function -Avoid nephrotoxins  UTI (urinary tract infection) UA concerning for UTI with positive nitrites, leukocytes and bacteria.  Unable to explain any symptoms.  Urine cultures with Klebsiella pneumonia, resistant to ampicillin and nitrofurantoin -Ceftriaxone  switched with Keflex to complete a total of 5-day course  Paroxysmal atrial fibrillation (HCC) Currently in sinus rhythm. - Continue home Eliquis ,  - Eliquis  and digoxin  is being held by cardiology today due to borderline bradycardia and pauses.  Diabetes mellitus without complication (HCC) CBG within goal. - Patient takes Lantus  8 units at home -Starting Semglee  at 5 units at bedtime -Continue with sensitive SSI  Hypothyroidism - Continue with home Synthroid   Physical deconditioning Recently discharged from SNF, physical therapy reevaluated her and recommending rehab.  Family is willing to pay out-of-pocket charges. -TOC for placement   Subjective: Patient was sitting in chair and appears little agitated.  She he was complaining of feeling cold and asking for more blankets.  Physical Exam: Vitals:   06/30/24 1539 06/30/24 2136 07/01/24 0450 07/01/24 0757  BP:  129/78 125/68 (!) 150/67  Pulse:  82 82 87  Resp:  16 16 17   Temp: 98 F (36.7 C) 97.6 F (36.4 C) 98 F (36.7 C) 98.2 F (36.8 C)  TempSrc: Oral Oral Oral Oral  SpO2:  99% 99% 100%  Weight:      Height:       General.  Frail and hard of hearing elderly lady, in no acute distress. Pulmonary.  Lungs clear bilaterally, normal respiratory effort. CV.  Regular rate and rhythm, no JVD, rub or murmur. Abdomen.  Soft, nontender, nondistended, BS positive. CNS.  Alert and oriented .  No focal neurologic deficit. Extremities.  1+ LE edema,  pulses intact and symmetrical.  Data Reviewed: Prior data reviewed  Family Communication: Grandson is looking for LTC  Disposition: Status is: Observation The patient  remains OBS appropriate and will d/c before 2 midnights.  Planned Discharge Destination: Skilled nursing facility  DVT prophylaxis.  Eliquis  Time spent: 45 minutes  This record has been created using Conservation officer, historic buildings. Errors have been sought and corrected,but may not always be located. Such creation errors do not reflect on the standard of care.   Author: Amaryllis Dare, MD 07/01/2024 2:57 PM  For on call review [email or web address].

## 2024-07-02 DIAGNOSIS — R4182 Altered mental status, unspecified: Secondary | ICD-10-CM | POA: Diagnosis not present

## 2024-07-02 DIAGNOSIS — E876 Hypokalemia: Secondary | ICD-10-CM | POA: Diagnosis not present

## 2024-07-02 DIAGNOSIS — N390 Urinary tract infection, site not specified: Secondary | ICD-10-CM | POA: Diagnosis not present

## 2024-07-02 LAB — GLUCOSE, CAPILLARY
Glucose-Capillary: 112 mg/dL — ABNORMAL HIGH (ref 70–99)
Glucose-Capillary: 118 mg/dL — ABNORMAL HIGH (ref 70–99)
Glucose-Capillary: 177 mg/dL — ABNORMAL HIGH (ref 70–99)

## 2024-07-02 MED ORDER — TORSEMIDE 20 MG PO TABS: 20.0000 mg | ORAL_TABLET | Freq: Every day | ORAL | Status: DC

## 2024-07-02 NOTE — Plan of Care (Signed)

## 2024-07-02 NOTE — TOC Transition Note (Signed)
 [facility] of [facility]) - Discharge Note   Patient Details  Name: Tanya Frye MRN: [id number] Date of Birth: 02-Aug-1941  [facility]) CM/SW Contact:  Corean ONEIDA Haddock, RN Phone Number: 07/02/2024, 2:28 PM   Clinical Narrative:    [facility] is unable to accept POA/grandson Darryle has accepted private pay bed [facility]  Patient will DC to: [facility]  Anticipated DC date: 07/02/24  Family notified:[facility] by: Zona   Per MD patient ready for DC to . RN, , patient's family, and facility notified of DC. Discharge Summary sent to facility. RN given number for report. DC packet on chart. Ambulance transport requested for patient.   TOC signing off.    Final next level of care: Memory Care Barriers to Discharge: Continued Medical Work up   Patient Goals and CMS Choice     Choice offered to / list presented to : [facility] POA / Guardian Geroge Biondo)      Discharge Placement                       Discharge Plan and Services Additional resources added to the After Visit Summary for   In-house Referral: Clinical Social Work Discharge Planning Services: CM Consult (Discharge planning)                                 Social Drivers of Health (SDOH) Interventions SDOH Screenings   Food Insecurity: No Food Insecurity (06/27/2024)  Housing: Unknown (06/27/2024)  Transportation Needs: No Transportation Needs (06/27/2024)  Utilities: Patient Unable To Answer (06/27/2024)  Alcohol Screen: Low Risk  (09/05/2021)  Depression (PHQ2-9): Low Risk  (06/14/2024)  Financial Resource Strain: Low Risk  (09/05/2021)  Physical Activity: Inactive (09/05/2021)  Social Connections: Patient Unable To Answer (06/27/2024)  Stress: No Stress Concern Present (09/05/2021)  Tobacco Use: Low Risk  (06/27/2024)     Readmission Risk Interventions     No data to display

## 2024-07-02 NOTE — Plan of Care (Signed)

## 2024-07-02 NOTE — Progress Notes (Signed)
 Mobility Specialist - Progress Note   07/02/24 1032  Mobility  Activity Ambulated with assistance;Stood at bedside  Level of Assistance Contact guard assist, steadying assist  Assistive Device Front wheel walker  Distance Ambulated (ft) 24 ft  Activity Response Tolerated well  Mobility visit 1 Mobility  Mobility Specialist Start Time (ACUTE ONLY) 0955  Mobility Specialist Stop Time (ACUTE ONLY) 1027  Mobility Specialist Time Calculation (min) (ACUTE ONLY) 32 min   Pt sitting in the recliner, requesting to amb to the bathroom for a bath. Pt STS to RW and amb to the bathroom CGA-- constant redirection to task required. NT assisted with bath. Pt returned to the recliner, left seated with alarm set and needs within reach.  America Silvan Mobility Specialist 07/02/24 10:35 AM

## 2024-07-02 NOTE — Discharge Summary (Signed)
 Physician Discharge Summary   Patient: Tanya Frye MRN: [id number] DOB: 1941/08/26  Admit date:     06/27/2024  Discharge date: 07/02/24  Discharge Physician: Amaryllis Dare   PCP: Zenaida Morene PARAS, MD   Recommendations at discharge:  Please obtain CBC and BMP on follow-up Her home amiodarone  and digoxin  are being held-cardiology can restart when appropriate Follow-up with primary care provider Follow-up with cardiology  Discharge Diagnoses: Principal Problem:   Hypokalemia Active Problems:   Hypomagnesemia   AMS (altered mental status)   Diarrhea   Chronic HFrEF (heart failure with reduced ejection fraction) (HCC)   Hypertension   CKD (chronic kidney disease), stage III (HCC)   UTI (urinary tract infection)   Paroxysmal atrial fibrillation (HCC)   Diabetes mellitus without complication (HCC)   Hypothyroidism   Physical deconditioning   Persistent atrial fibrillation (HCC)   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  Tanya Frye is a 83 y.o. female with medical history significant of COPD, chronic HFrEF, HTN, IDDM, PAF on Eliquis , CKD stage IIIa, was brought in by family member for altered mentation.   Patient was recently hospitalized in late August for pneumonia and cardiogenic shock, and she was discharged to nursing home.  Monday, patient was brought home and she found  they moved everything to different places.  That makes her more aggressive behavior when she was refusing medications, throwing away different stuff.  She was also having some diarrhea and frequent accidents of loose bowel movements over the past 3 to 4 days.  Feeling weak, apparently she was taking a whole lot of stool softeners and laxative.  Decreased p.o. intake for the past 2 days.  On presentation vital stable, labs with potassium of 2.6 and magnesium  of 1.6.  TSH normal, UA with significant proteinuria, ketonuria, positive nitrite and  leukocytes, urine cultures were ordered.  UDS positive for benzodiazepine.  Mentation appears to be at baseline.  For family concern that she had an history of suicidal ideation in the past, psych was consulted.  Patient was unable to participate in a very meaningful history but she did not meet criteria for inpatient psychiatric admission and remain low risk of self-harm.  10/6: Vital stable, subtherapeutic digoxin  level likely secondary to being noncompliant, mild hypocalcemia so started on p.o. supplement. PT and OT recommending SNF.  Seems like significant underlying dementia but no proper evaluation or diagnosis.  Family is willing to pay out-of-pocket for rehab.  Later monitor concern of bradycardia, heart rate mostly in 30s with couple of pauses.  Patient remained asymptomatic.  Discontinuing home metoprolol  and monitoring.  10/7: Persistent borderline bradycardia, home metoprolol  and amiodarone  both were discontinued by cardiology.  Urine cultures with Klebsiella pneumonia-antibiotics switched with Keflex based on susceptibility results.  Still pending placement.  10/8: Bradycardia resolved, cardiology ordered repeat echo-done with pending results.  IV Lasix  was ordered but she lost IV and does not want to be replaced, ordered torsemide  20 mg daily. Awaiting placement.  10/9: Hemodynamically stable, at time refusing meds and food and becoming agitated.  Still pending disposition, patient need to be a private pay as has used all of her rehab days.  10/10: Remained hemodynamically stable, heart rate now improving so restarting home metoprolol .  Discussed with cardiology and they would like to keep holding amiodarone  and digoxin , they will arrange close outpatient follow-up and restart those medications when appropriate.  Patient completed a course of antibiotic while in the hospital.  She will continue on current medications and need to have a close follow-up with her providers for further  assistance.  Assessment and Plan: * Hypokalemia Resolved -Continue to monitor and replete as needed  Hypomagnesemia Resolved with repletion  AMS (altered mental status) Seems more like behavioral issue, was evaluated by psych and did not meet the criteria for inpatient behavioral services.  Patient likely have underlying significant dementia with behavioral issues but no proper diagnosis which need to be done as outpatient.  Currently appears to be at her baseline.  Diarrhea Likely secondary to excessive laxative use, seems improving. -Holding excessive laxative Continue to monitor-  Chronic HFrEF (heart failure with reduced ejection fraction) (HCC) Prior echo done in August 2025 with EF of 30 to 35%, moderate MR and AS Clinically appears euvolemic with BNP of 162 - Initially home diuretics were held, starting on torsemide  20 mg daily, received 1 dose of IV Lasix  by cardiology on 10/7.  Hypertension Blood pressure mildly elevated -Home losartan  and torsemide  restarted -Monitor blood pressure and we will resume home medications as appropriate  CKD (chronic kidney disease), stage III (HCC) History of CKD stage III A Renal function seems stable and at baseline. - Monitor renal function -Avoid nephrotoxins  UTI (urinary tract infection) UA concerning for UTI with positive nitrites, leukocytes and bacteria.  Unable to explain any symptoms.  Urine cultures with Klebsiella pneumonia, resistant to ampicillin and nitrofurantoin -Ceftriaxone  switched with Keflex to complete a total of 5-day course, patient completed the course while in the hospital.  Paroxysmal atrial fibrillation (HCC) Currently in sinus rhythm. - Continue home Eliquis ,  - Home amiodarone  and digoxin  are being held by cardiology as patient developed bradycardia while in the hospital.  As heart rate now improved we are resuming metoprolol  and she will need a close outpatient follow-up with cardiology and they can  resume amiodarone  and digoxin  if needed.  Diabetes mellitus without complication (HCC) CBG within goal. - Patient takes Lantus  8 units at home and will continue on discharge   Hypothyroidism - Continue with home Synthroid   Physical deconditioning Recently discharged from SNF, physical therapy reevaluated her and recommending rehab.  Family is willing to pay out-of-pocket charges. -TOC for placement  Consultants: Cardiology Procedures performed: None Disposition: Skilled nursing facility Diet recommendation:  Discharge Diet Orders (From admission, onward)     Start     Ordered   07/02/24 0000  Diet - low sodium heart healthy        07/02/24 1403           Cardiac and Carb modified diet DISCHARGE MEDICATION: Allergies as of 07/02/2024       Reactions   Penicillins Rash        Medication List     PAUSE taking these medications    Digoxin  62.5 MCG Tabs Wait to take this until your doctor or other care provider tells you to start again. Take 0.0625 mg by mouth daily.       STOP taking these medications    amiodarone  200 MG tablet Commonly known as: PACERONE    furosemide  20 MG tablet Commonly known as: LASIX    ipratropium 17 MCG/ACT inhaler Commonly known as: ATROVENT HFA   traMADol 50 MG tablet Commonly known as: ULTRAM       TAKE these medications    acetaminophen  650 MG CR tablet Commonly known as: TYLENOL  Take 650 mg by mouth every 8 (eight) hours as needed for pain.   apixaban  5 MG Tabs tablet  Commonly known as: ELIQUIS  Take 1 tablet (5 mg total) by mouth 2 (two) times daily.   ascorbic acid  500 MG tablet Commonly known as: VITAMIN C  Take 500 mg by mouth daily.   calcium carbonate 1500 (600 Ca) MG Tabs tablet Commonly known as: OSCAL Take 1,500 mg by mouth in the morning and at bedtime.   docusate sodium  100 MG capsule Commonly known as: COLACE Take 100 mg by mouth 2 (two) times daily.   feeding supplement Liqd Take 237 mLs by  mouth 2 (two) times daily between meals.   Fish Oil 500 MG Caps Take 1,000 mg by mouth daily.   fluticasone  50 MCG/ACT nasal spray Commonly known as: FLONASE  Place 1 spray into both nostrils daily.   insulin  glargine-yfgn 100 UNIT/ML injection Commonly known as: SEMGLEE  Inject 0.08 mLs (8 Units total) into the skin daily.   levothyroxine  75 MCG tablet Commonly known as: SYNTHROID  Take 75 mcg by mouth daily before breakfast.   loratadine  10 MG tablet Commonly known as: CLARITIN  Take 10 mg by mouth daily.   losartan  50 MG tablet Commonly known as: COZAAR  Take 50 mg by mouth daily.   lovastatin 10 MG tablet Commonly known as: MEVACOR TAKE 1 TABLET ONCE A DAY ORALLY   metoprolol  tartrate 25 MG tablet Commonly known as: LOPRESSOR  Take 1 tablet (25 mg total) by mouth 2 (two) times daily.   multivitamin with minerals Tabs tablet Take 1 tablet by mouth daily.   nystatin powder Apply 1 Application topically 2 (two) times daily. Apply Nystatin powder to red area under right breast and place ABD pad between breast and chest twice daily   ondansetron  4 MG disintegrating tablet Commonly known as: ZOFRAN -ODT Take 4 mg by mouth every 8 (eight) hours as needed for nausea or vomiting.   phenol 1.4 % Liqd Commonly known as: CHLORASEPTIC Use as directed 1 spray in the mouth or throat every 2 (two) hours as needed for throat irritation / pain.   polyethylene glycol 17 g packet Commonly known as: MIRALAX  / GLYCOLAX  Take 17 g by mouth daily.   torsemide  20 MG tablet Commonly known as: DEMADEX  Take 1 tablet (20 mg total) by mouth daily. Start taking on: July 03, 2024   triamcinolone  ointment 0.5 % Commonly known as: KENALOG  Apply 1 Application topically 2 (two) times daily.   trolamine salicylate 10 % cream Commonly known as: ASPERCREME Apply 1 Application topically 3 (three) times daily. Apply to right knee three times a day.        Follow-up Information     Zenaida Morene PARAS, MD. Schedule an appointment as soon as possible for a visit in 1 week(s).   Specialty: Cardiology Contact information: [address] [place] [address] [phone]                Discharge Exam: Fredricka Weights   06/27/24 0949  Weight: 74.3 kg   General.  Frail but irritable elderly lady, in no acute distress. Pulmonary.  Lungs clear bilaterally, normal respiratory effort. CV.  Regular rate and rhythm, no JVD, rub or murmur. Abdomen.  Soft, nontender, nondistended, BS positive. CNS.  Alert and oriented .  No focal neurologic deficit. Extremities.  No edema, no cyanosis, pulses intact and symmetrical.  Condition at discharge: stable  The results of significant diagnostics from this hospitalization (including imaging, microbiology, ancillary and laboratory) are listed below for reference.   Imaging Studies: ECHOCARDIOGRAM LIMITED Result Date: 06/30/2024    ECHOCARDIOGRAM  LIMITED REPORT   Patient Name:   ANTONISHA WASKEY Date of Exam: 06/30/2024 Medical Rec #:  [id number]    Height:       65.0 in Accession #:    [id number]   Weight:       163.8 lb Date of Birth:  1940-09-27    BSA:          1.817 m Patient Age:    83 years     BP:           145/65 mmHg Patient Gender: F            HR:           73 bpm. Exam Location:  [facility] Procedure: Limited Echo, Color Doppler and Cardiac Doppler (Both Spectral and            Color Flow Doppler were utilized during procedure). Indications:     Cardiomyopathy-unspecified I42.9  History:         Patient has prior history of Echocardiogram examinations, most                  recent 05/12/2024. CHF; Risk Factors:Diabetes and Hypertension.  Sonographer:     Christopher Furnace Referring Phys:  [phone] CHRISTOPHER END Diagnosing Phys: Evalene Lunger MD  Sonographer Comments: Technically challenging study due to limited acoustic windows and no apical window. IMPRESSIONS  1. Left ventricular ejection fraction, by estimation, is 55 to 60%. The left  ventricle has normal function. The left ventricle has no regional wall motion abnormalities. Left ventricular diastolic parameters are indeterminate.  2. Right ventricular systolic function is normal. The right ventricular size is normal. There is normal pulmonary artery systolic pressure. The estimated right ventricular systolic pressure is 32.5 mmHg.  3. The mitral valve is normal in structure. Mild to moderate mitral valve regurgitation. Unable to exclude stenosis, no gradient measured.  4. The aortic valve is normal in structure. There is moderate calcification of the aortic valve. Aortic valve regurgitation is not visualized. Aortic valve sclerosis/calcification is present, unable to exclude stenosis as gradient not measured.  5. The inferior vena cava is normal in size with greater than 50% respiratory variability, suggesting right atrial pressure of 3 mmHg. FINDINGS  Left Ventricle: Left ventricular ejection fraction, by estimation, is 55 to 60%. The left ventricle has normal function. The left ventricle has no regional wall motion abnormalities. The left ventricular internal cavity size was normal in size. There is  no left ventricular hypertrophy. Left ventricular diastolic parameters are indeterminate. Right Ventricle: The right ventricular size is normal. No increase in right ventricular wall thickness. Right ventricular systolic function is normal. There is normal pulmonary artery systolic pressure. The tricuspid regurgitant velocity is 2.62 m/s, and  with an assumed right atrial pressure of 5 mmHg, the estimated right ventricular systolic pressure is 32.5 mmHg. Left Atrium: Left atrial size was normal in size. Right Atrium: Right atrial size was normal in size. Pericardium: There is no evidence of pericardial effusion. Mitral Valve: The mitral valve is normal in structure. There is moderate calcification of the mitral valve leaflet(s). Mild mitral annular calcification. Mild to moderate mitral valve  regurgitation. No evidence of mitral valve stenosis. Tricuspid Valve: The tricuspid valve is normal in structure. Tricuspid valve regurgitation is not demonstrated. No evidence of tricuspid stenosis. Aortic Valve: The aortic valve is normal in structure. There is moderate calcification of the aortic valve. Aortic valve regurgitation is not visualized. Aortic valve sclerosis/calcification is present, without any  evidence of aortic stenosis. Pulmonic Valve: The pulmonic valve was normal in structure. Pulmonic valve regurgitation is not visualized. No evidence of pulmonic stenosis. Aorta: The aortic root is normal in size and structure. Venous: The inferior vena cava is normal in size with greater than 50% respiratory variability, suggesting right atrial pressure of 3 mmHg. IAS/Shunts: No atrial level shunt detected by color flow Doppler. Additional Comments: Spectral Doppler performed. Color Doppler performed.  LEFT VENTRICLE PLAX 2D LVIDd:         4.90 cm LVIDs:         2.80 cm LV PW:         0.80 cm LV IVS:        1.40 cm LVOT diam:     2.00 cm LVOT Area:     3.14 cm  LEFT ATRIUM         Index LA diam:    4.20 cm 2.31 cm/m   AORTA Ao Root diam: 2.70 cm TRICUSPID VALVE TR Peak grad:   27.5 mmHg TR Vmax:        262.00 cm/s  SHUNTS Systemic Diam: 2.00 cm Evalene Lunger MD Electronically signed by Evalene Lunger MD Signature Date/Time: 06/30/2024/5:28:03 PM    Final    CT Cervical Spine Wo Contrast Result Date: 06/27/2024 EXAM: CT CERVICAL SPINE WITHOUT CONTRAST 06/27/2024 11:43:07 AM TECHNIQUE: CT of the cervical spine was performed without the administration of intravenous contrast. Multiplanar reformatted images are provided for review. Automated exposure control, iterative reconstruction, and/or weight based adjustment of the mA/kV was utilized to reduce the radiation dose to as low as reasonably achievable. COMPARISON: CT of the cervical spine dated 01/03/2018. CLINICAL HISTORY: Neck trauma (Age >= 65y). Pt  brought in by EMS from home for AMS/confusion. Per family, pt kicked them out of the house the other day. Family cleaned the house when they left. Today the house is trashed and pt is accusing family of moving things so she cannot find them. FINDINGS: CERVICAL SPINE: BONES AND ALIGNMENT: No acute fracture or traumatic malalignment. There is incomplete fusion of the atlantoaxial joint. There is incomplete fusion of the facet joints throughout the cervical spine, except at C6-C7. At C6-C7, there is slight degenerative anterolisthesis. DEGENERATIVE CHANGES: At C6-C7, there is moderate bilateral facet arthrosis. Otherwise, no significant degenerative changes. SOFT TISSUES: No prevertebral soft tissue swelling. IMPRESSION: 1. No acute abnormality of the cervical spine related to the reported neck trauma. 2. Degenerative changes at C6-7 with slight anterolisthesis and moderate bilateral facet arthrosis. Electronically signed by: Evalene Coho MD 06/27/2024 11:58 AM EDT RP Workstation: [id number]   CT HEAD WO CONTRAST ( ) Result Date: 06/27/2024 EXAM: CT HEAD WITHOUT CONTRAST 06/27/2024 11:43:07 AM TECHNIQUE: CT of the head was performed without the administration of intravenous contrast. Automated exposure control, iterative reconstruction, and/or weight based adjustment of the mA/kV was utilized to reduce the radiation dose to as low as reasonably achievable. COMPARISON: None available. CLINICAL HISTORY: Head trauma, minor (Age >= 65y). Pt brought in by EMS from home for AMS/confusion. Per family, pt kicked them out of the house the other day. Family cleaned the house when they left. Today the house is trashed and pt is accusing family of moving things so she cannot find them. FINDINGS: BRAIN AND VENTRICLES: No acute hemorrhage. No evidence of acute infarct. No hydrocephalus. No extra-axial collection. No mass effect or midline shift. Age-related atrophy. Mild periventricular white matter disease.  Calcification within the carotid siphons. ORBITS: No acute abnormality. The patient  is status post bilateral lens replacement. SINUSES: No acute abnormality. SOFT TISSUES AND SKULL: No acute soft tissue abnormality. No skull fracture. IMPRESSION: 1. No acute intracranial abnormality. 2. Age-related cerebral atrophy and mild chronic microvascular white matter disease. Electronically signed by: Evalene Coho MD 06/27/2024 11:54 AM EDT RP Workstation: [id number]   DG Chest Portable 1 View Result Date: 06/27/2024 EXAM: 1 VIEW(S) XRAY OF THE CHEST 06/27/2024 10:51:36 AM COMPARISON: 05/09/2024 CLINICAL HISTORY: AMS. Pt brought in by EMS from home for AMS/confusion. Per family, pt kicked them out of the house the other day. Family cleaned the house when they left. Today the house is trashed and pt is accusing family of moving things so she cannot find them. FINDINGS: LUNGS AND PLEURA: There are mildly prominent interstitial markings present bilaterally. There has been interval improved aeration of the left lung base. No focal pulmonary opacity. No pulmonary edema. No pleural effusion. No pneumothorax. HEART AND MEDIASTINUM: Mild cardiomegaly, stable. Aortic atherosclerosis. BONES AND SOFT TISSUES: No acute osseous abnormality. IMPRESSION: 1. Mildly prominent bilateral interstitial markings. 2. Interval improved aeration of the left lung base. 3. Mild cardiomegaly, stable. Electronically signed by: Evalene Coho MD 06/27/2024 11:14 AM EDT RP Workstation: [id number]    Microbiology: Results for orders placed or performed during the hospital encounter of 06/27/24  Urine Culture     Status: Abnormal   Collection Time: 06/27/24 12:41 PM   Specimen: Urine, Clean Catch  Result Value Ref Range Status   Specimen Description   Final    URINE, CLEAN CATCH Performed at [facility], [address] [place], [place] [address]    Special Requests   Final    NONE Performed at [facility],  [address], [place], [place] [address]    Culture >=100,000 COLONIES/mL KLEBSIELLA PNEUMONIAE (A)  Final   Report Status 06/29/2024 FINAL  Final   Organism ID, Bacteria KLEBSIELLA PNEUMONIAE (A)  Final      Susceptibility   Klebsiella pneumoniae - MIC*    AMPICILLIN >=32 RESISTANT Resistant     CEFAZOLIN (URINE) Value in next row Sensitive      2 SENSITIVEThis is a modified FDA-approved test that has been validated and its performance characteristics determined by the reporting laboratory.  This laboratory is certified under the Clinical Laboratory Improvement Amendments CLIA as qualified to perform high complexity clinical laboratory testing.    CEFEPIME  Value in next row Sensitive      2 SENSITIVEThis is a modified FDA-approved test that has been validated and its performance characteristics determined by the reporting laboratory.  This laboratory is certified under the Clinical Laboratory Improvement Amendments CLIA as qualified to perform high complexity clinical laboratory testing.    ERTAPENEM Value in next row Sensitive      2 SENSITIVEThis is a modified FDA-approved test that has been validated and its performance characteristics determined by the reporting laboratory.  This laboratory is certified under the Clinical Laboratory Improvement Amendments CLIA as qualified to perform high complexity clinical laboratory testing.    CEFTRIAXONE  Value in next row Sensitive      2 SENSITIVEThis is a modified FDA-approved test that has been validated and its performance characteristics determined by the reporting laboratory.  This laboratory is certified under the Clinical Laboratory Improvement Amendments CLIA as qualified to perform high complexity clinical laboratory testing.    CIPROFLOXACIN Value in next row Sensitive      2 SENSITIVEThis is a modified FDA-approved test that has been validated and its performance characteristics  determined by the reporting laboratory.  This laboratory is  certified under the Clinical Laboratory Improvement Amendments CLIA as qualified to perform high complexity clinical laboratory testing.    GENTAMICIN Value in next row Sensitive      2 SENSITIVEThis is a modified FDA-approved test that has been validated and its performance characteristics determined by the reporting laboratory.  This laboratory is certified under the Clinical Laboratory Improvement Amendments CLIA as qualified to perform high complexity clinical laboratory testing.    NITROFURANTOIN Value in next row Resistant      2 SENSITIVEThis is a modified FDA-approved test that has been validated and its performance characteristics determined by the reporting laboratory.  This laboratory is certified under the Clinical Laboratory Improvement Amendments CLIA as qualified to perform high complexity clinical laboratory testing.    TRIMETH/SULFA Value in next row Sensitive      2 SENSITIVEThis is a modified FDA-approved test that has been validated and its performance characteristics determined by the reporting laboratory.  This laboratory is certified under the Clinical Laboratory Improvement Amendments CLIA as qualified to perform high complexity clinical laboratory testing.    AMPICILLIN/SULBACTAM Value in next row Sensitive      2 SENSITIVEThis is a modified FDA-approved test that has been validated and its performance characteristics determined by the reporting laboratory.  This laboratory is certified under the Clinical Laboratory Improvement Amendments CLIA as qualified to perform high complexity clinical laboratory testing.    PIP/TAZO Value in next row Sensitive      <=4 SENSITIVEThis is a modified FDA-approved test that has been validated and its performance characteristics determined by the reporting laboratory.  This laboratory is certified under the Clinical Laboratory Improvement Amendments CLIA as qualified to perform high complexity clinical laboratory testing.    MEROPENEM Value in  next row Sensitive      <=4 SENSITIVEThis is a modified FDA-approved test that has been validated and its performance characteristics determined by the reporting laboratory.  This laboratory is certified under the Clinical Laboratory Improvement Amendments CLIA as qualified to perform high complexity clinical laboratory testing.    * >=100,000 COLONIES/mL KLEBSIELLA PNEUMONIAE    Labs: CBC: Recent Labs  Lab 06/27/24 0956 06/28/24 0412  WBC 8.6 8.5  NEUTROABS 7.0  --   HGB 12.3 10.8*  HCT 38.0 31.8*  MCV 88.2 87.4  PLT 260 276   Basic Metabolic Panel: Recent Labs  Lab 06/27/24 0956 06/27/24 0957 06/27/24 1055 06/27/24 1951 06/28/24 0412 06/29/24 0355 07/01/24 0326  NA 134*  --   --   --  136 133* 133*  K 2.6*  --   --  3.3* 3.5 4.0 3.7  CL 93*  --   --   --  102 102 99  CO2 19*  --   --   --  23 23 25   GLUCOSE 128*  --   --   --  96 106* 73  BUN 13  --   --   --  12 22 15   CREATININE 1.16*  --   --   --  1.09* 1.13* 1.08*  CALCIUM 8.3*  --   --   --  7.7* 8.3* 8.5*  MG  --  1.6*  --   --  1.8 2.4  --   PHOS  --   --  3.3  --   --  2.5  --    Liver Function Tests: Recent Labs  Lab 06/27/24 0956 06/29/24 0355  AST 29  --  ALT 18  --   ALKPHOS 39  --   BILITOT 1.7*  --   PROT 6.4*  --   ALBUMIN 3.0* 2.6*   CBG: Recent Labs  Lab 07/01/24 1713 07/01/24 2142 07/02/24 0513 07/02/24 0810 07/02/24 1141  GLUCAP 98 110* 112* 118* 177*    Discharge time spent: greater than 30 minutes.  This record has been created using Conservation officer, historic buildings. Errors have been sought and corrected,but may not always be located. Such creation errors do not reflect on the standard of care.   Signed: Amaryllis Dare, MD [facility] 07/02/2024

## 2024-07-07 ENCOUNTER — Telehealth: Payer: Self-pay | Admitting: Medical

## 2024-07-07 NOTE — Telephone Encounter (Signed)
-----   Message from Cadence VEAR Fishman sent at 07/02/2024  2:07 PM EDT ----- Regarding: hosp follow-up Can we make her heart failure follow-up sooner please? She is getting out of the hospital . Can she be seen within 2 weeks at heart failure clinic with Zenaida if possible?

## 2024-07-07 NOTE — Telephone Encounter (Signed)
 Spoke with patient's daughter in law, pt is currently residing at [facility] and is not being compliant with appointments/care at this time. Patient is unable to schedule a sooner appt as requested for hospital follow up. Patient's daughter in law will reach out and schedule sooner appt for patient if at all possible.

## 2024-07-20 ENCOUNTER — Inpatient Hospital Stay
Admission: EM | Admit: 2024-07-20 | Discharge: 2024-07-26 | DRG: 637 | Disposition: A | Discharge status: Skilled Nursing Facility | Source: Skilled Nursing Facility | Attending: Internal Medicine | Admitting: Internal Medicine

## 2024-07-20 ENCOUNTER — Other Ambulatory Visit: Payer: Self-pay

## 2024-07-20 DIAGNOSIS — N1831 Chronic kidney disease, stage 3a: Secondary | ICD-10-CM | POA: Diagnosis present

## 2024-07-20 DIAGNOSIS — E1122 Type 2 diabetes mellitus with diabetic chronic kidney disease: Secondary | ICD-10-CM | POA: Diagnosis present

## 2024-07-20 DIAGNOSIS — K219 Gastro-esophageal reflux disease without esophagitis: Secondary | ICD-10-CM | POA: Diagnosis present

## 2024-07-20 DIAGNOSIS — I5022 Chronic systolic (congestive) heart failure: Secondary | ICD-10-CM | POA: Diagnosis present

## 2024-07-20 DIAGNOSIS — Z7901 Long term (current) use of anticoagulants: Secondary | ICD-10-CM

## 2024-07-20 DIAGNOSIS — I130 Hypertensive heart and chronic kidney disease with heart failure and stage 1 through stage 4 chronic kidney disease, or unspecified chronic kidney disease: Secondary | ICD-10-CM | POA: Diagnosis present

## 2024-07-20 DIAGNOSIS — Z79899 Other long term (current) drug therapy: Secondary | ICD-10-CM

## 2024-07-20 DIAGNOSIS — Z91128 Patient's intentional underdosing of medication regimen for other reason: Secondary | ICD-10-CM

## 2024-07-20 DIAGNOSIS — Z794 Long term (current) use of insulin: Secondary | ICD-10-CM

## 2024-07-20 DIAGNOSIS — Z880 Allergy status to penicillin: Secondary | ICD-10-CM

## 2024-07-20 DIAGNOSIS — I080 Rheumatic disorders of both mitral and aortic valves: Secondary | ICD-10-CM | POA: Diagnosis present

## 2024-07-20 DIAGNOSIS — E11628 Type 2 diabetes mellitus with other skin complications: Principal | ICD-10-CM | POA: Diagnosis present

## 2024-07-20 DIAGNOSIS — Z9049 Acquired absence of other specified parts of digestive tract: Secondary | ICD-10-CM

## 2024-07-20 DIAGNOSIS — E876 Hypokalemia: Secondary | ICD-10-CM | POA: Diagnosis present

## 2024-07-20 DIAGNOSIS — H9190 Unspecified hearing loss, unspecified ear: Secondary | ICD-10-CM | POA: Diagnosis present

## 2024-07-20 DIAGNOSIS — E039 Hypothyroidism, unspecified: Secondary | ICD-10-CM | POA: Diagnosis present

## 2024-07-20 DIAGNOSIS — Z8419 Family history of other disorders of kidney and ureter: Secondary | ICD-10-CM

## 2024-07-20 DIAGNOSIS — E872 Acidosis, unspecified: Secondary | ICD-10-CM | POA: Diagnosis present

## 2024-07-20 DIAGNOSIS — Z825 Family history of asthma and other chronic lower respiratory diseases: Secondary | ICD-10-CM

## 2024-07-20 DIAGNOSIS — L97919 Non-pressure chronic ulcer of unspecified part of right lower leg with unspecified severity: Secondary | ICD-10-CM | POA: Diagnosis present

## 2024-07-20 DIAGNOSIS — R9431 Abnormal electrocardiogram [ECG] [EKG]: Secondary | ICD-10-CM

## 2024-07-20 DIAGNOSIS — J449 Chronic obstructive pulmonary disease, unspecified: Secondary | ICD-10-CM | POA: Diagnosis present

## 2024-07-20 DIAGNOSIS — T68XXXA Hypothermia, initial encounter: Secondary | ICD-10-CM

## 2024-07-20 DIAGNOSIS — Z79890 Hormone replacement therapy (postmenopausal): Secondary | ICD-10-CM

## 2024-07-20 DIAGNOSIS — F03918 Unspecified dementia, unspecified severity, with other behavioral disturbance: Secondary | ICD-10-CM | POA: Diagnosis present

## 2024-07-20 DIAGNOSIS — I10 Essential (primary) hypertension: Secondary | ICD-10-CM | POA: Diagnosis present

## 2024-07-20 DIAGNOSIS — L03116 Cellulitis of left lower limb: Principal | ICD-10-CM | POA: Diagnosis present

## 2024-07-20 DIAGNOSIS — R451 Restlessness and agitation: Principal | ICD-10-CM

## 2024-07-20 DIAGNOSIS — I480 Paroxysmal atrial fibrillation: Secondary | ICD-10-CM | POA: Diagnosis present

## 2024-07-20 DIAGNOSIS — I5042 Chronic combined systolic (congestive) and diastolic (congestive) heart failure: Secondary | ICD-10-CM | POA: Diagnosis present

## 2024-07-20 DIAGNOSIS — Z90710 Acquired absence of both cervix and uterus: Secondary | ICD-10-CM

## 2024-07-20 DIAGNOSIS — T3696XA Underdosing of unspecified systemic antibiotic, initial encounter: Secondary | ICD-10-CM | POA: Diagnosis present

## 2024-07-20 DIAGNOSIS — N390 Urinary tract infection, site not specified: Secondary | ICD-10-CM | POA: Diagnosis present

## 2024-07-20 DIAGNOSIS — Z66 Do not resuscitate: Secondary | ICD-10-CM | POA: Diagnosis present

## 2024-07-20 DIAGNOSIS — E1169 Type 2 diabetes mellitus with other specified complication: Secondary | ICD-10-CM

## 2024-07-20 DIAGNOSIS — G9341 Metabolic encephalopathy: Secondary | ICD-10-CM | POA: Diagnosis present

## 2024-07-20 DIAGNOSIS — E119 Type 2 diabetes mellitus without complications: Secondary | ICD-10-CM

## 2024-07-20 LAB — URINALYSIS, COMPLETE (UACMP) WITH MICROSCOPIC
Bacteria, UA: NONE SEEN
Bilirubin Urine: NEGATIVE
Glucose, UA: NEGATIVE mg/dL
Hgb urine dipstick: NEGATIVE
Ketones, ur: 5 mg/dL — AB
Leukocytes,Ua: NEGATIVE
Nitrite: NEGATIVE
Protein, ur: 30 mg/dL — AB
Specific Gravity, Urine: 1.017 (ref 1.005–1.030)
pH: 5 (ref 5.0–8.0)

## 2024-07-20 LAB — COMPREHENSIVE METABOLIC PANEL WITH GFR
ALT: 33 U/L (ref 0–44)
AST: 43 U/L — ABNORMAL HIGH (ref 15–41)
Albumin: 3.9 g/dL (ref 3.5–5.0)
Alkaline Phosphatase: 39 U/L (ref 38–126)
Anion gap: 19 — ABNORMAL HIGH (ref 5–15)
BUN: 13 mg/dL (ref 8–23)
CO2: 21 mmol/L — ABNORMAL LOW (ref 22–32)
Calcium: 9.2 mg/dL (ref 8.9–10.3)
Chloride: 95 mmol/L — ABNORMAL LOW (ref 98–111)
Creatinine, Ser: 1.31 mg/dL — ABNORMAL HIGH (ref 0.44–1.00)
GFR, Estimated: 41 mL/min — ABNORMAL LOW (ref >60)
Glucose, Bld: 179 mg/dL — ABNORMAL HIGH (ref 70–99)
Potassium: 2.9 mmol/L — ABNORMAL LOW (ref 3.5–5.1)
Sodium: 135 mmol/L (ref 135–145)
Total Bilirubin: 1.1 mg/dL (ref 0.0–1.2)
Total Protein: 7.3 g/dL (ref 6.5–8.1)

## 2024-07-20 LAB — CBC
HCT: 42.5 % (ref 36.0–46.0)
Hemoglobin: 14.3 g/dL (ref 12.0–15.0)
MCH: 29.5 pg (ref 26.0–34.0)
MCHC: 33.6 g/dL (ref 30.0–36.0)
MCV: 87.6 fL (ref 80.0–100.0)
Platelets: 321 K/uL (ref 150–400)
RBC: 4.85 MIL/uL (ref 3.87–5.11)
RDW: 14.8 % (ref 11.5–15.5)
WBC: 9.8 K/uL (ref 4.0–10.5)
nRBC: 0.2 % (ref 0.0–0.2)

## 2024-07-20 MED ORDER — SODIUM CHLORIDE 0.9 % IV BOLUS
1000.0000 mL | Freq: Once | INTRAVENOUS | Status: AC
  Administered 2024-07-20: 1000 mL via INTRAVENOUS

## 2024-07-20 MED ORDER — HALOPERIDOL LACTATE 5 MG/ML IJ SOLN
5.0000 mg | Freq: Once | INTRAMUSCULAR | Status: AC
  Administered 2024-07-20: 5 mg via INTRAMUSCULAR
  Filled 2024-07-20: qty 1

## 2024-07-20 MED ORDER — POTASSIUM CHLORIDE CRYS ER 20 MEQ PO TBCR
40.0000 meq | EXTENDED_RELEASE_TABLET | Freq: Once | ORAL | Status: AC
  Administered 2024-07-21: 40 meq via ORAL
  Filled 2024-07-20: qty 2

## 2024-07-20 MED ORDER — LORAZEPAM 2 MG/ML IJ SOLN
2.0000 mg | Freq: Once | INTRAMUSCULAR | Status: AC
  Administered 2024-07-20: 2 mg via INTRAMUSCULAR
  Filled 2024-07-20: qty 1

## 2024-07-20 NOTE — ED Notes (Signed)
 Patient states, Jodie just wants me to die so she can get all my money. And I wanted Adam to have it. She got my hearing aids, my teeth, my glasses... I don't want a viewing, the way I look. She got it like I can't walk, can't do nothing... you let this rail down, I'll show you I can walk. I can walk with the walker, I push the wheelchair down the hall. I'll be dead before I get outta here. I was hoping to get my house back today. I hate being in a crazy house. I wish I'd never trusted her. No acute needs at this time.

## 2024-07-20 NOTE — BH Assessment (Signed)
 Pt is currently too somnolent to participate in assessment. Psych team will follow up with assessment at later time.

## 2024-07-20 NOTE — ED Provider Notes (Signed)
 [facility] Provider Note    Event Date/Time   First MD Initiated Contact with Patient 07/20/24 1741     (approximate)  History   Chief Complaint: Altered Mental Status  HPI  Tanya Frye is a 83 y.o. female with a past medical history of diabetes, hypertension, CHF, presents to the emergency department from [facility] for altered mental status.  According to EMS report per EMS patient was recently diagnosed with a urinary tract infection at her nursing facility but has been refusing her antibiotics.  EMS states staff has noted increased confusion over the last couple days worse today where now she is becoming combative and aggressive towards staff as well.  Physical Exam   Triage Vital Signs: ED Triage Vitals  Encounter Vitals Group     BP 07/20/24 1745 (!) 134/92     Girls Systolic BP Percentile --      Girls Diastolic BP Percentile --      Boys Systolic BP Percentile --      Boys Diastolic BP Percentile --      Pulse Rate 07/20/24 1745 (!) 120     Resp 07/20/24 1745 18     Temp 07/20/24 1745 98.2 F (36.8 C)     Temp Source 07/20/24 1745 Axillary     SpO2 07/20/24 1745 100 %     Weight 07/20/24 1747 163 lb 2.3 oz (74 kg)     Height 07/20/24 1747 5' 5 (1.651 m)     Head Circumference --      [facility] --      Pain Score 07/20/24 1747 0     Pain Loc --      Pain Education --      Exclude from Growth Chart --     Most recent vital signs: Vitals:   07/20/24 1745  BP: (!) 134/92  Pulse: (!) 120  Resp: 18  Temp: 98.2 F (36.8 C)  SpO2: 100%    General: Awake, no distress.  Patient is combative/aggressive when he attempted to get near her or take vitals, etc.  When I am speaking with her she is relatively calm but if anyone else approaches her she gets upset. CV:  Good peripheral perfusion.   Resp:  Normal effort.   Abd:  No distention.   Other:  Moving all extremities well.  Minimal lower extremity IMA  bilaterally.   ED Results / Procedures / Treatments   MEDICATIONS ORDERED IN ED: Medications  haloperidol lactate (HALDOL) injection 5 mg (5 mg Intramuscular Given 07/20/24 1746)  LORazepam  (ATIVAN ) injection 2 mg (2 mg Intramuscular Given 07/20/24 1746)     IMPRESSION / MDM / ASSESSMENT AND PLAN / ED COURSE  I reviewed the triage vital signs and the nursing notes.  Patient's presentation is most consistent with acute presentation with potential threat to life or bodily function.  Patient presents emergency department for altered mental status, reported recent UTI but refusing antibiotics.  Here the patient is confused, she is yelling at staff gets agitated easily and will swing at staff members at times.  We will dose small dose of IM Ativan  and Haldol so that we can check vitals and help care for the patient.  We will check labs we will obtain a urine sample we will continue to closely monitor.  Patient's work shows dehydration with anion gap at 19.  Patient receiving IV fluids.  Patient CBC is normal.  I spoke to the patient's  stepdaughter who is listed as her emergency contact.  She states this has been ongoing for about 1 month where she has had a progressive decline in cognition and now has become agitated and combative.  She states today was the first time that she has ever gotten physical with staff at the nursing facility.  They are concerned that she could have underlying dementia but she often refuses to take any of her home medications, she is not on any sedating medications or any medications needed for agitation.  Spoke with the nursing facility and they state this has been progressively worsening but tonight was more significant than typical.  As this appears to be a new decline for the patient but no obvious medical cause for her symptoms we will have psychiatry and TTS evaluate.  This very well could be more dementia related however given her acute agitation the patient may  benefit from psychiatric medications going forward.  Nursing facility would be willing to take the patient back as well  FINAL CLINICAL IMPRESSION(S) / ED DIAGNOSES   Altered mental status Agitation   Note:  This document was prepared using Dragon voice recognition software and may include unintentional dictation errors.   Dorothyann Drivers, MD 07/20/24 2122

## 2024-07-20 NOTE — ED Triage Notes (Signed)
 Pt BIB EMS for AMS. Pt non-compliant with care at [facility] and has a known UTI which she is refusing to take her medications. Some doses administered however inconsistent and not completed. Pt combative and non-compliant with care at triage.

## 2024-07-20 NOTE — ED Notes (Signed)
 Patient pulled IV out. Dressing applied.

## 2024-07-21 DIAGNOSIS — I480 Paroxysmal atrial fibrillation: Secondary | ICD-10-CM | POA: Diagnosis present

## 2024-07-21 DIAGNOSIS — K219 Gastro-esophageal reflux disease without esophagitis: Secondary | ICD-10-CM | POA: Diagnosis present

## 2024-07-21 DIAGNOSIS — E039 Hypothyroidism, unspecified: Secondary | ICD-10-CM

## 2024-07-21 DIAGNOSIS — N390 Urinary tract infection, site not specified: Secondary | ICD-10-CM | POA: Diagnosis present

## 2024-07-21 DIAGNOSIS — Z79890 Hormone replacement therapy (postmenopausal): Secondary | ICD-10-CM | POA: Diagnosis not present

## 2024-07-21 DIAGNOSIS — J449 Chronic obstructive pulmonary disease, unspecified: Secondary | ICD-10-CM | POA: Diagnosis present

## 2024-07-21 DIAGNOSIS — R9431 Abnormal electrocardiogram [ECG] [EKG]: Secondary | ICD-10-CM

## 2024-07-21 DIAGNOSIS — Z79899 Other long term (current) drug therapy: Secondary | ICD-10-CM | POA: Diagnosis not present

## 2024-07-21 DIAGNOSIS — E11628 Type 2 diabetes mellitus with other skin complications: Secondary | ICD-10-CM | POA: Diagnosis present

## 2024-07-21 DIAGNOSIS — N1831 Chronic kidney disease, stage 3a: Secondary | ICD-10-CM

## 2024-07-21 DIAGNOSIS — Z794 Long term (current) use of insulin: Secondary | ICD-10-CM | POA: Diagnosis not present

## 2024-07-21 DIAGNOSIS — L97919 Non-pressure chronic ulcer of unspecified part of right lower leg with unspecified severity: Secondary | ICD-10-CM | POA: Diagnosis present

## 2024-07-21 DIAGNOSIS — F03918 Unspecified dementia, unspecified severity, with other behavioral disturbance: Secondary | ICD-10-CM | POA: Diagnosis present

## 2024-07-21 DIAGNOSIS — Z7901 Long term (current) use of anticoagulants: Secondary | ICD-10-CM | POA: Diagnosis not present

## 2024-07-21 DIAGNOSIS — Z66 Do not resuscitate: Secondary | ICD-10-CM | POA: Diagnosis present

## 2024-07-21 DIAGNOSIS — I110 Hypertensive heart disease with heart failure: Secondary | ICD-10-CM | POA: Diagnosis not present

## 2024-07-21 DIAGNOSIS — Z880 Allergy status to penicillin: Secondary | ICD-10-CM | POA: Diagnosis not present

## 2024-07-21 DIAGNOSIS — I5022 Chronic systolic (congestive) heart failure: Secondary | ICD-10-CM | POA: Diagnosis not present

## 2024-07-21 DIAGNOSIS — E872 Acidosis, unspecified: Secondary | ICD-10-CM | POA: Diagnosis present

## 2024-07-21 DIAGNOSIS — E876 Hypokalemia: Secondary | ICD-10-CM | POA: Diagnosis present

## 2024-07-21 DIAGNOSIS — E119 Type 2 diabetes mellitus without complications: Secondary | ICD-10-CM

## 2024-07-21 DIAGNOSIS — E1122 Type 2 diabetes mellitus with diabetic chronic kidney disease: Secondary | ICD-10-CM | POA: Diagnosis present

## 2024-07-21 DIAGNOSIS — I5042 Chronic combined systolic (congestive) and diastolic (congestive) heart failure: Secondary | ICD-10-CM | POA: Diagnosis present

## 2024-07-21 DIAGNOSIS — G9341 Metabolic encephalopathy: Secondary | ICD-10-CM | POA: Diagnosis present

## 2024-07-21 DIAGNOSIS — I080 Rheumatic disorders of both mitral and aortic valves: Secondary | ICD-10-CM | POA: Diagnosis present

## 2024-07-21 DIAGNOSIS — I130 Hypertensive heart and chronic kidney disease with heart failure and stage 1 through stage 4 chronic kidney disease, or unspecified chronic kidney disease: Secondary | ICD-10-CM | POA: Diagnosis present

## 2024-07-21 DIAGNOSIS — L03116 Cellulitis of left lower limb: Secondary | ICD-10-CM

## 2024-07-21 LAB — CBC
HCT: 32.8 % — ABNORMAL LOW (ref 36.0–46.0)
Hemoglobin: 10.8 g/dL — ABNORMAL LOW (ref 12.0–15.0)
MCH: 29.8 pg (ref 26.0–34.0)
MCHC: 32.9 g/dL (ref 30.0–36.0)
MCV: 90.6 fL (ref 80.0–100.0)
Platelets: 187 K/uL (ref 150–400)
RBC: 3.62 MIL/uL — ABNORMAL LOW (ref 3.87–5.11)
RDW: 14.8 % (ref 11.5–15.5)
WBC: 6.8 K/uL (ref 4.0–10.5)
nRBC: 0 % (ref 0.0–0.2)

## 2024-07-21 LAB — CORTISOL: Cortisol, Plasma: 15.1 ug/dL

## 2024-07-21 LAB — T4, FREE: Free T4: 1.92 ng/dL — ABNORMAL HIGH (ref 0.61–1.12)

## 2024-07-21 LAB — CK: Total CK: 61 U/L (ref 38–234)

## 2024-07-21 LAB — GLUCOSE, CAPILLARY
Glucose-Capillary: 93 mg/dL (ref 70–99)
Glucose-Capillary: 154 mg/dL — ABNORMAL HIGH (ref 70–99)
Glucose-Capillary: 121 mg/dL — ABNORMAL HIGH (ref 70–99)
Glucose-Capillary: 120 mg/dL — ABNORMAL HIGH (ref 70–99)

## 2024-07-21 LAB — BASIC METABOLIC PANEL WITH GFR
Anion gap: 16 — ABNORMAL HIGH (ref 5–15)
BUN: 11 mg/dL (ref 8–23)
CO2: 23 mmol/L (ref 22–32)
Calcium: 7.9 mg/dL — ABNORMAL LOW (ref 8.9–10.3)
Chloride: 101 mmol/L (ref 98–111)
Creatinine, Ser: 1.09 mg/dL — ABNORMAL HIGH (ref 0.44–1.00)
GFR, Estimated: 51 mL/min — ABNORMAL LOW (ref >60)
Glucose, Bld: 88 mg/dL (ref 70–99)
Potassium: 2.7 mmol/L — CL (ref 3.5–5.1)
Sodium: 140 mmol/L (ref 135–145)

## 2024-07-21 LAB — LACTIC ACID, PLASMA
Lactic Acid, Venous: 1.6 mmol/L (ref 0.5–1.9)
Lactic Acid, Venous: 1.2 mmol/L (ref 0.5–1.9)

## 2024-07-21 LAB — MAGNESIUM: Magnesium: 1.5 mg/dL — ABNORMAL LOW (ref 1.7–2.4)

## 2024-07-21 LAB — TSH: TSH: 4.069 u[IU]/mL (ref 0.350–4.500)

## 2024-07-21 MED ORDER — PROCHLORPERAZINE EDISYLATE 10 MG/2ML IJ SOLN: 5.0000 mg | INTRAMUSCULAR | Status: DC | PRN

## 2024-07-21 MED ORDER — ALBUTEROL SULFATE (2.5 MG/3ML) 0.083% IN NEBU: 2.5000 mg | INHALATION_SOLUTION | RESPIRATORY_TRACT | Status: DC | PRN

## 2024-07-21 MED ORDER — SODIUM CHLORIDE 0.9 % IV SOLN
1.0000 g | INTRAVENOUS | Status: AC
  Administered 2024-07-22 – 2024-07-25 (×3): 1 g via INTRAVENOUS
  Filled 2024-07-21 (×4): qty 10

## 2024-07-21 MED ORDER — CALCIUM GLUCONATE-NACL 1-0.675 GM/50ML-% IV SOLN
1.0000 g | Freq: Once | INTRAVENOUS | Status: AC
  Administered 2024-07-21: 1000 mg via INTRAVENOUS
  Filled 2024-07-21: qty 50

## 2024-07-21 MED ORDER — POTASSIUM CHLORIDE 10 MEQ/100ML IV SOLN
10.0000 meq | INTRAVENOUS | Status: AC
  Administered 2024-07-21 (×2): 10 meq via INTRAVENOUS
  Filled 2024-07-21 (×2): qty 100

## 2024-07-21 MED ORDER — LEVOTHYROXINE SODIUM 50 MCG PO TABS
75.0000 ug | ORAL_TABLET | Freq: Every day | ORAL | Status: DC
  Filled 2024-07-21 (×2): qty 1

## 2024-07-21 MED ORDER — INSULIN ASPART 100 UNIT/ML IJ SOLN: 0.0000 [IU] | Freq: Every day | INTRAMUSCULAR | Status: DC

## 2024-07-21 MED ORDER — PRAVASTATIN SODIUM 20 MG PO TABS
10.0000 mg | ORAL_TABLET | Freq: Every day | ORAL | Status: DC
  Administered 2024-07-25: 10 mg via ORAL
  Filled 2024-07-21: qty 1

## 2024-07-21 MED ORDER — POTASSIUM CHLORIDE 10 MEQ/100ML IV SOLN
10.0000 meq | INTRAVENOUS | Status: AC
  Filled 2024-07-21 (×2): qty 100

## 2024-07-21 MED ORDER — SODIUM CHLORIDE 0.9 % IV SOLN
1.0000 g | Freq: Once | INTRAVENOUS | Status: AC
  Administered 2024-07-21: 1 g via INTRAVENOUS
  Filled 2024-07-21: qty 10

## 2024-07-21 MED ORDER — ACETAMINOPHEN 650 MG RE SUPP: 650.0000 mg | Freq: Four times a day (QID) | RECTAL | Status: DC | PRN

## 2024-07-21 MED ORDER — DOCUSATE SODIUM 100 MG PO CAPS
100.0000 mg | ORAL_CAPSULE | Freq: Two times a day (BID) | ORAL | Status: DC
  Administered 2024-07-25: 100 mg via ORAL
  Filled 2024-07-21 (×6): qty 1

## 2024-07-21 MED ORDER — POTASSIUM CHLORIDE 10 MEQ/100ML IV SOLN
10.0000 meq | INTRAVENOUS | Status: AC
  Administered 2024-07-21 (×2): 10 meq via INTRAVENOUS
  Filled 2024-07-21 (×4): qty 100

## 2024-07-21 MED ORDER — MAGNESIUM SULFATE 2 GM/50ML IV SOLN: 2.0000 g | Freq: Once | INTRAVENOUS | Status: DC

## 2024-07-21 MED ORDER — LOSARTAN POTASSIUM 50 MG PO TABS
50.0000 mg | ORAL_TABLET | Freq: Every day | ORAL | Status: DC
Start: 2024-07-21 — End: 2024-07-27
  Filled 2024-07-21 (×3): qty 1

## 2024-07-21 MED ORDER — ACETAMINOPHEN 325 MG PO TABS: 650.0000 mg | ORAL_TABLET | Freq: Four times a day (QID) | ORAL | Status: DC | PRN

## 2024-07-21 MED ORDER — APIXABAN 5 MG PO TABS
5.0000 mg | ORAL_TABLET | Freq: Two times a day (BID) | ORAL | Status: DC
Start: 2024-07-21 — End: 2024-07-27
  Administered 2024-07-23 – 2024-07-25 (×2): 5 mg via ORAL
  Filled 2024-07-21 (×6): qty 1

## 2024-07-21 MED ORDER — METOPROLOL TARTRATE 25 MG PO TABS
25.0000 mg | ORAL_TABLET | Freq: Two times a day (BID) | ORAL | Status: DC
  Administered 2024-07-23 – 2024-07-25 (×2): 25 mg via ORAL
  Filled 2024-07-21 (×6): qty 1

## 2024-07-21 MED ORDER — LORAZEPAM 0.5 MG PO TABS
0.5000 mg | ORAL_TABLET | Freq: Two times a day (BID) | ORAL | Status: DC | PRN
  Filled 2024-07-21: qty 1

## 2024-07-21 MED ORDER — SODIUM CHLORIDE 0.9 % IV SOLN: 1.0000 g | INTRAVENOUS | Status: DC

## 2024-07-21 MED ORDER — POTASSIUM CHLORIDE CRYS ER 20 MEQ PO TBCR
40.0000 meq | EXTENDED_RELEASE_TABLET | Freq: Once | ORAL | Status: DC
  Filled 2024-07-21: qty 2

## 2024-07-21 MED ORDER — LEVOTHYROXINE SODIUM 50 MCG PO TABS: 75.0000 ug | ORAL_TABLET | Freq: Every day | ORAL | Status: DC

## 2024-07-21 MED ORDER — INSULIN ASPART 100 UNIT/ML IJ SOLN
0.0000 [IU] | Freq: Three times a day (TID) | INTRAMUSCULAR | Status: DC
  Administered 2024-07-22: 1 [IU] via SUBCUTANEOUS
  Administered 2024-07-23: 2 [IU] via SUBCUTANEOUS
  Filled 2024-07-21 (×3): qty 1

## 2024-07-21 MED ORDER — VANCOMYCIN HCL 1750 MG/350ML IV SOLN
1750.0000 mg | Freq: Once | INTRAVENOUS | Status: AC
  Administered 2024-07-21: 1750 mg via INTRAVENOUS
  Filled 2024-07-21: qty 350

## 2024-07-21 MED ORDER — INSULIN GLARGINE-YFGN 100 UNIT/ML ~~LOC~~ SOLN
8.0000 [IU] | Freq: Every day | SUBCUTANEOUS | Status: DC
  Administered 2024-07-22 – 2024-07-23 (×2): 8 [IU] via SUBCUTANEOUS
  Filled 2024-07-21 (×7): qty 0.08

## 2024-07-21 NOTE — Assessment & Plan Note (Signed)
Near baseline

## 2024-07-21 NOTE — Assessment & Plan Note (Addendum)
 Hypomagnesemia Potassium 2.9 and magnesium  1.5 GERD potassium in the ED Will give oral magnesium  Pharmacy consult for electrolyte management

## 2024-07-21 NOTE — ED Notes (Signed)
 Informed Dr. Cyrena that pt's legs are red, and wheeping clear fluid on left leg. 2+ pitting edema in bilateral extremities. Pt is hypothermic and bear hugger is applied. Pt is very HOH and doesn't seem to understand what is going on.

## 2024-07-21 NOTE — Assessment & Plan Note (Signed)
 Sliding scale insulin  coverage

## 2024-07-21 NOTE — Assessment & Plan Note (Addendum)
 Avoid QT prolonging drugs if possible.

## 2024-07-21 NOTE — TOC Initial Note (Addendum)
 Transition of Care [facility] - Initial/Assessment Note    Patient Details  Name: Tanya Frye MRN: [id number] Date of Birth: [birth year]  [facility] CM/SW Contact:    Lauraine JAYSON Carpen, LCSW Phone Number: 07/21/2024, 1:07 PM  Clinical Narrative:  Per chart review, patient was admitted from [facility]. No family at bedside when CSW went by the room, per RN. CSW called daughter-in-law, introduced role, and explained that discharge planning would be discussed. Daughter-in-law confirmed plan to return to [facility] at discharge. Admissions coordinator confirmed as well. No further concerns. CSW will continue to follow patient and her daughter-in-law for support and facilitate return to SNF once medically stable. Patient will need ambulance transport.         1:54 pm: Per [facility] admissions coordinator, patient was receiving rehab.         Expected Discharge Plan: Skilled Nursing Facility Barriers to Discharge: Continued Medical Work up   Patient Goals and CMS Choice     Choice offered to / list presented to : Adult Children      Expected Discharge Plan and Services     Post Acute Care Choice: Resumption of Svcs/PTA Provider Living arrangements for the past 2 months: Skilled Nursing Facility                                      Prior Living Arrangements/Services Living arrangements for the past 2 months: Skilled Nursing Facility Lives with:: Facility Resident Patient language and need for interpreter reviewed:: Yes Do you feel safe going back to the place where you live?: Yes      Need for Family Participation in Patient Care: Yes (Comment) Care giver support system in place?: Yes (comment)   Criminal Activity/Legal Involvement Pertinent to Current Situation/Hospitalization: No - Comment as needed  Activities of Daily Living      Permission Sought/Granted Permission sought to share information with : Facility Medical Sales Representative,  Family Supports    Share Information with NAME: Jodie Rossa  Permission granted to share info w AGENCY: [facility]  Permission granted to share info w Relationship: Daughter-in-law  Permission granted to share info w Contact Information: [phone]  Emotional Assessment Appearance:: Appears stated age Attitude/Demeanor/Rapport: Unable to Assess Affect (typically observed): Unable to Assess Orientation: : Oriented to Self Alcohol / Substance Use: Not Applicable Psych Involvement: Yes (comment)  Admission diagnosis:  Hypokalemia [E87.6] Hypomagnesemia [E83.42] Agitation [R45.1] Prolonged Q-T interval on ECG [R94.31] Left leg cellulitis [L03.116] Hypothermia, initial encounter [T68.XXXA] Acute metabolic encephalopathy [G93.41] Patient Active Problem List   Diagnosis Date Noted   Acute metabolic encephalopathy 07/21/2024   Cellulitis of left lower extremity 07/21/2024   Prolonged QT interval 07/21/2024   Dementia with behavioral disturbance (HCC) 07/21/2024   Persistent atrial fibrillation (HCC) 06/30/2024   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   Hypomagnesemia 06/28/2024   Diarrhea 06/28/2024   Stage 3a chronic kidney disease (HCC) 06/28/2024   Physical deconditioning 06/28/2024   Paroxysmal atrial fibrillation (HCC) 06/28/2024   UTI (urinary tract infection) 06/28/2024   AMS (altered mental status) 06/27/2024   Hypokalemia 06/27/2024   Vitamin D  deficiency 06/14/2024   COPD (chronic obstructive pulmonary disease) (HCC) 05/13/2024   Atrial flutter (HCC) 05/12/2024   Chronic HFrEF (heart failure with reduced ejection fraction) (HCC) 05/12/2024   CHF exacerbation (HCC) 05/09/2024   Pressure injury of skin 02/04/2024   Atrial fibrillation with RVR (  HCC) 02/01/2024   Sepsis (HCC) 01/29/2024   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   Non-STEMI (non-ST elevated myocardial infarction) (HCC) 01/29/2024   Community acquired pneumonia 01/29/2024    Cardiogenic shock (HCC) 01/29/2024   Acute respiratory failure (HCC) 01/28/2024   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   Tachycardia 08/29/2020   Need for influenza vaccination 05/30/2020   Seasonal allergic rhinitis due to pollen 02/24/2020   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, unsp (HCC) 02/24/2020   Hypertension 02/24/2020   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 - [place], [place] - [address] [place] [address] Phone: [phone] Fax: [phone]     Social Drivers of Health (SDOH) Social History: SDOH Screenings   Food Insecurity: No Food Insecurity (06/27/2024)  Housing: Unknown (06/27/2024)  Transportation Needs: Patient Unable To Answer (07/21/2024)  Utilities: Patient Unable To Answer (07/21/2024)  Alcohol Screen: Low Risk  (09/05/2021)  Depression (PHQ2-9): Low Risk  (06/14/2024)  Financial Resource Strain: Low Risk  (09/05/2021)  Physical Activity: Inactive (09/05/2021)  Social Connections: Patient Unable To Answer (07/21/2024)  Stress: No Stress Concern Present (09/05/2021)  Tobacco Use: Low Risk  (07/20/2024)   SDOH Interventions:     Readmission Risk Interventions     No data to display

## 2024-07-21 NOTE — ED Notes (Signed)
 Pt woke up stating she's cold and this nt gave her a warm blanket. Pt still hollering she's cold so this nt turned up the heat and pt still complaining about being cold.

## 2024-07-21 NOTE — Assessment & Plan Note (Addendum)
 Dementia with behavioral disturbance Currently secondary to acute infection, electrolyte abnormality Neurologic checks fall and aspiration precautions Delirium precautions Haldol as needed

## 2024-07-21 NOTE — Assessment & Plan Note (Signed)
 Clinically euvolemic to dry Continue digoxin , losartan , metoprolol --Will hold torsemide  tonight while being hydrated

## 2024-07-21 NOTE — Consult Note (Signed)
 [facility] Psychiatric Consult Initial  Patient Name: .Tanya Frye  MRN: [id number]  DOB: 07-05-1941  Consult Order details:  Orders (From admission, onward)     Start     Ordered   07/20/24 2123  IP CONSULT TO PSYCHIATRY       Ordering Provider: Dorothyann Drivers, MD  Provider:  (Not yet assigned)  Question Answer Comment  Consult Timeframe ROUTINE - requires response within 24 hours   Reason for Consult? Consult for medication management   Contact phone number where the requesting provider can be reached [phone]      07/20/24 2122             Mode of Visit: In person    Psychiatry Consult Evaluation  Service Date: July 21, 2024 LOS:  LOS: 0 days  Chief Complaint Increased confusion and aggression reported by nursing facility  Primary Psychiatric Diagnoses  Dementia with behavioral disturbance    Assessment  KHRISTINA Frye is a 83 y.o. female admitted: Medically  Patient was consulted to psychiatry due to having diagnosed dementia with behavioral disturbances.  Per review of nursing notes, it appears patient has been refusing to take her home medications or her medications that were prescribed for her UTI.  Per review of notes, it appears that this instance is the first time that patient has become physical with the staff at her residence as well as being aggressive on initial presentation to the emergency department requiring IM agitation meds.  On assessment today, patient was able to tell me her name and birthday as well as that she was at the hospital and the current year was 2025.  She was also able to accurately tell me the upcoming holidays being Halloween and Thanksgiving.  Patient's current QTc is noted to be 656 which limits psychiatry's ability significantly to intervene with medications at this time.  We will continue to monitor patient's behavior while hospitalized medically.  We do recommend to continue with the 0.5 mg Ativan  2 times daily as needed due to  patient's significantly prolonged QTc.  If behaviors increase or worsen, we discussed with medical team about potentially adding Depakote sprinkles.  We will continue to monitor patient while medically admitted to assess for need of this medication.  Diagnoses:  Active Hospital problems: Principal Problem:   Acute metabolic encephalopathy Active Problems:   Hypertension   Chronic HFrEF (heart failure with reduced ejection fraction) (HCC)   COPD (chronic obstructive pulmonary disease) (HCC)   Hypothyroidism   Diabetes mellitus without complication (HCC)   Hypokalemia   Hypomagnesemia   Stage 3a chronic kidney disease (HCC)   Paroxysmal atrial fibrillation (HCC)   Cellulitis of left lower extremity   Prolonged QT interval   Dementia with behavioral disturbance (HCC)    Plan   ## Psychiatric Medication Recommendations:  Continue ativan  0.5mg  2 times daily PRN due to patient qtc  ## Medical Decision Making Capacity: Not specifically addressed in this encounter  ## Further Work-up:   -- most recent EKG on 07/21/2024 had QtC of 656 -- Pertinent labwork reviewed earlier this admission includes: cbc, cmp   ## Disposition:-- There are no psychiatric contraindications to discharge at this time  ## Behavioral / Environmental: -Delirium Precautions: Delirium Interventions for Nursing and Staff: - RN to open blinds every AM. - To Bedside: Glasses, hearing aide, and pt's own shoes. Make available to patients. when possible and encourage use. - Encourage po fluids when appropriate, keep fluids within reach. - OOB to  chair with meals. - Passive ROM exercises to all extremities with AM & PM care. - RN to assess orientation to person, time and place QAM and PRN. - Recommend extended visitation hours with familiar family/friends as feasible. - Staff to minimize disturbances at night. Turn off television when pt asleep or when not in use.    ## Safety and Observation Level:  - Based on my  clinical evaluation, I estimate the patient to be at low risk of self harm in the current setting. - At this time, we recommend  1:1 Observation. This decision is based on my review of the chart including patient's history and current presentation, interview of the patient, mental status examination, and consideration of suicide risk including evaluating suicidal ideation, plan, intent, suicidal or self-harm behaviors, risk factors, and protective factors. This judgment is based on our ability to directly address suicide risk, implement suicide prevention strategies, and develop a safety plan while the patient is in the clinical setting. Please contact our team if there is a concern that risk level has changed.  CSSR Risk Category:C-SSRS RISK CATEGORY: No Risk  Suicide Risk Assessment: Patient has following modifiable risk factors for suicide: N/A, which we are addressing by using therapeutic communication when talking with patient. Patient has following non-modifiable or demographic risk factors for suicide: N/A Patient has the following protective factors against suicide: Supportive family, no history of suicide attempts, and no history of NSSIB  Thank you for this consult request. Recommendations have been communicated to the primary team.  We will sign off at this time.   Zelda Sharps, NP        History of Present Illness  Relevant Aspects of [facility]   Patient Report:  Patient was consulted to psychiatry due to having diagnosed dementia with behavioral disturbances.  Per review of nursing notes, it appears patient has been refusing to take her home medications or her medications that were prescribed for her UTI.  Per review of notes, it appears that this instance is the first time that patient has become physical with the staff at her residence as well as being aggressive on initial presentation to the emergency department requiring IM agitation meds.  On assessment today, patient  was able to tell me her name and birthday as well as that she was at the hospital and the current year was 2025.  She was also able to accurately tell me the upcoming holidays being Halloween and Thanksgiving.  Patient's current QTc is noted to be 656 which limits psychiatry's ability significantly to intervene with medications at this time.  We will continue to monitor patient's behavior while hospitalized medically.  We do recommend to continue with the 0.5 mg Ativan  2 times daily as needed due to patient's significantly prolonged QTc.  If behaviors increase or worsen, we discussed with medical team about potentially adding Depakote sprinkles. We will have to discuss this with patient POA or emergency contact due to patient current mental status. We will continue to monitor patient while medically admitted to assess for need of this medication.  Patient did endorse getting upset with staff at her nursing facility due to slow response times.  She reported having to take herself to the bathroom or having to wait long. She also stated that some staff will wipe front to back and others will wipe back to front.  When asked about getting physically aggressive with staff, patient denied any memory of these events.  Patient often required redirection in conversation.  She would often go on a tangent about her surviving family member, Juliene when asked assessment questions. Patient is also very hard of hearing which further impaired assessment.  Due to patient's baseline cognitive status, as stated above patient is not a candidate for inpatient psychiatric treatment due to inability to effectively participate in treatment programming.  It does appear at this time, that patient's symptoms are likely related to her previously diagnosed dementia.  Psychiatry will continue to assist medical team with management of behaviors.  Psych ROS:  Depression: Denied Anxiety:  Denied Mania (lifetime and current):  Denied Psychosis: (lifetime and current): Denied      Psychiatric and Social History  Psychiatric History:  Information collected from Chart review due to patient being poor historian  Prev Dx/Sx: Dementia with behavioral disturbance Current Psych Provider: Denied Home Meds (current): Denied any mental health medications Previous Med Trials: Previously recommended seroquel  use PRN Therapy: None  Prior Psych Hospitalization: Denied  Prior Self Harm: Denied Prior Violence: Denied  Family Psych History: Unknown Family Hx suicide: Unknown   Social History:   Educational Hx: UTA Occupational Hx: UTA Legal Hx: UTA Living Situation: At nursing home Spiritual Hx: UTA Access to weapons/lethal means: Denied   Substance History Alcohol: UTA  Tobacco: UTA Illicit drugs: UTA Prescription drug abuse: UTA Rehab hx: UTA  Exam Findings  Physical Exam: Deferred to medical MD- note reviewed Vital Signs:  Temp:  [95.8 F (35.4 C)-98.5 F (36.9 C)] 97.5 F (36.4 C) (10/29 0809) Pulse Rate:  [64-120] 81 (10/29 0809) Resp:  [13-18] 14 (10/29 0809) BP: (95-134)/(54-92) 102/59 (10/29 0809) SpO2:  [99 %-100 %] 99 % (10/29 0809) Weight:  [74 kg] 74 kg (10/28 1747) Blood pressure (!) 102/59, pulse 81, temperature (!) 97.5 F (36.4 C), temperature source Axillary, resp. rate 14, height 5' 5 (1.651 m), weight 74 kg, SpO2 99%. Body mass index is 27.15 kg/m.    Mental Status Exam: General Appearance: Casual  Orientation:  Other:  see above  Memory:  Immediate;   impaired Recent;   impaired Remote;   impaired  Concentration:  Concentration: Poor and Attention Span: Poor  Recall:  Poor  Attention  Poor  Eye Contact:  Good  Speech:  Clear and Coherent  Language:  Fair  Volume:  Normal  Mood: labile  Affect:  Congruent  Thought Process:  Irrelevant  Thought Content:  WDL  Suicidal Thoughts:  No  Homicidal Thoughts:  No  Judgement:  Impaired  Insight:  Lacking  Psychomotor  Activity:  Normal  Akathisia:  No  Fund of Knowledge:  Fair      Assets:  Housing Social Support  Cognition:  Impaired,  Moderate  ADL's:  Impaired  AIMS (if indicated):        Other History   These have been pulled in through the EMR, reviewed, and updated if appropriate.  Family History:  The patient's family history includes Asthma in her mother; Kidney disease in her father.  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   CHF (congestive heart failure) (HCC)    Diabetes mellitus without complication (HCC)    HOH (hard of hearing)    extremely HOH   Hypertension    Morbid obesity (HCC) 02/24/2020   Palpitations    occasional related to meds/heart races    Surgical History: Past Surgical History:  Procedure Laterality Date   ABDOMINAL HYSTERECTOMY  1990   CATARACT  EXTRACTION W/PHACO Right 12/20/2020   Procedure: CATARACT EXTRACTION PHACO AND INTRAOCULAR LENS PLACEMENT (IOC) RIGHT DIABETIC 8.13 01:17.6 10.5%;  Surgeon: Mittie Gaskin, MD;  Location: [facility];  Service: Ophthalmology;  Laterality: Right;   CATARACT EXTRACTION W/PHACO Left 01/03/2021   Procedure: CATARACT EXTRACTION PHACO AND INTRAOCULAR LENS PLACEMENT (IOC) LEFT DIABETIC  7.37 01:15.6 9.8%;  Surgeon: Mittie Gaskin, MD;  Location: [facility];  Service: Ophthalmology;  Laterality: Left;   CHOLECYSTECTOMY     DILATION AND CURETTAGE OF UTERUS  before 1990   X2   NECK SURGERY  03/30/2008   Per family, pt broke her neck   STOMACH SURGERY     tumor removed, per family     Medications:   Current Facility-Administered Medications:    acetaminophen  (TYLENOL ) tablet 650 mg, 650 mg, Oral, Q6H PRN **OR** acetaminophen  (TYLENOL ) suppository 650 mg, 650 mg, Rectal, Q6H PRN, Cleatus Delayne GAILS, MD   albuterol  (PROVENTIL ) (2.5 MG/3ML) 0.083% nebulizer solution 2.5 mg, 2.5 mg, Nebulization, Q2H PRN, Cleatus Delayne GAILS, MD   apixaban   (ELIQUIS ) tablet 5 mg, 5 mg, Oral, BID, Duncan, Hazel V, MD   calcium gluconate 1 g/ 50 mL sodium chloride  IVPB, 1 g, Intravenous, Once, Niels Kayla FALCON, [facility], Last Rate: 50 mL/hr at 07/21/24 1025, 1,000 mg at 07/21/24 1025   docusate sodium  (COLACE) capsule 100 mg, 100 mg, Oral, BID, Cleatus Delayne GAILS, MD   insulin  aspart (novoLOG ) injection 0-5 Units, 0-5 Units, Subcutaneous, QHS, Cleatus Delayne GAILS, MD   insulin  aspart (novoLOG ) injection 0-9 Units, 0-9 Units, Subcutaneous, TID WC, Cleatus Delayne GAILS, MD   insulin  glargine-yfgn (SEMGLEE ) injection 8 Units, 8 Units, Subcutaneous, Daily, Cleatus Delayne GAILS, MD   levothyroxine  (SYNTHROID ) tablet 75 mcg, 75 mcg, Oral, Q0600, Dail Rankin RAMAN, RPH   LORazepam  (ATIVAN ) tablet 0.5 mg, 0.5 mg, Oral, BID PRN, Cleatus Delayne GAILS, MD   losartan  (COZAAR ) tablet 50 mg, 50 mg, Oral, Daily, Cleatus Delayne GAILS, MD   magnesium  sulfate IVPB 2 g 50 mL, 2 g, Intravenous, Once, Cyrena Mylar, MD   metoprolol  tartrate (LOPRESSOR ) tablet 25 mg, 25 mg, Oral, BID, Cleatus Delayne GAILS, MD   potassium chloride  SA (KLOR-CON  M) CR tablet 40 mEq, 40 mEq, Oral, Once, Niels Kayla FALCON, [facility]   pravastatin  (PRAVACHOL ) tablet 10 mg, 10 mg, Oral, q1800, Cleatus Delayne GAILS, MD   prochlorperazine (COMPAZINE) injection 5 mg, 5 mg, Intravenous, Q4H PRN, Cleatus Delayne GAILS, MD  Allergies: Allergies  Allergen Reactions   Penicillins Rash    Zelda Sharps, NP

## 2024-07-21 NOTE — Plan of Care (Signed)

## 2024-07-21 NOTE — Evaluation (Signed)
 Physical Therapy Evaluation Patient Details Name: Tanya Frye MRN: [id number] DOB: [birth year] Today's Date: 07/21/2024  History of Present Illness  Tanya Frye is a 83 y.o. female with medical history significant for COPD, chronic HFrEF, HTN, IDDM, PAF on Eliquis , CKD stage IIIa, dementia, being admitted with left lower extremity cellulitis and electrolyte derangement presenting with altered mental status and increased agitation.  She was sent in by [facility].  Clinical Impression  Pt was able to participate relatively well with PT but her HOH and confusion did cause some issues.  Pt had just recently completed OT eval where she was able to ambulate the nurses' station with FWW, further long distance ambulation deferred in preference to trial with hand-held-assist and balance challenge mobility in the room.  Pt displayed functional strength t/o and showed surprising confidence with most acts though was guarded and showed some unsteadiness with dynamic balance challenges.  Pt will benefit from continued PT to address functional limitations and to insure safe and appropriate discharge.       If plan is discharge home, recommend the following: A little help with walking and/or transfers;A little help with bathing/dressing/bathroom;Assistance with cooking/housework;Supervision due to cognitive status;Assist for transportation   Can travel by private vehicle   Yes    Equipment Recommendations None recommended by PT  Recommendations for Other Services       Functional Status Assessment Patient has had a recent decline in their functional status and demonstrates the ability to make significant improvements in function in a reasonable and predictable amount of time.     Precautions / Restrictions Precautions Precautions: Fall Recall of Precautions/Restrictions: Impaired Restrictions Weight Bearing Restrictions Per Provider Order: No      Mobility  Bed Mobility Overal  bed mobility: Needs Assistance Bed Mobility: Sit to Supine       Sit to supine: Contact guard assist   General bed mobility comments: Pt was able to assume long sitting in bed w/o assist, heavy cuing to transition to EOB but minimal actual phyiscal or tactile assist needed    Transfers Overall transfer level: Needs assistance Equipment used: 1 person hand held assist Transfers: Sit to/from Stand Sit to Stand: Contact guard assist, Min assist           General transfer comment: Pt HOH made transition to sitting delayed, however she was able to lift hips of standard height bed w/o hesitation, light HHA to insure balance and safety.    Ambulation/Gait Ambulation/Gait assistance: Contact guard assist, Min assist Gait Distance (Feet): 35 Feet Assistive device: 1 person hand held assist         General Gait Details: Pt was able to do some limited in-room ambulation with single HHA, but clearly having some hesitancy and was unable to lessen UE WBing when PT attempted to facilitate.  Pt with no overt LOBs but general guarded unsteadiness w/o UE support.  On arrival to room pt had just compeleted a loop of the nurses' station with walker (~266ft)  Stairs            Wheelchair Mobility     Tilt Bed    Modified Rankin (Stroke Patients Only)       Balance Overall balance assessment: Needs assistance Sitting-balance support: Feet supported, No upper extremity supported Sitting balance-Leahy Scale: Good     Standing balance support: During functional activity Standing balance-Leahy Scale: Fair Standing balance comment: Pt was able to do short hold heel raises with b/l HHA, attempts  at NBOS with eyes open/closed necessitated increased UE use, however pt showed good effort did not have any true LOBs.                             Pertinent Vitals/Pain Pain Assessment Pain Assessment: No/denies pain    Home Living Family/patient expects to be discharged to::  Skilled nursing facility Living Arrangements:  (patient poor historian, admitted from SNF - was apparently living alone a few months ago)                      Prior Function Prior Level of Function : Patient poor historian/Family not available             Mobility Comments: Pt reports doing some walking at rehab but liking sitting in w/c and pulling herself around with LEs - reports she was driving and community ambulatory just a few months ago ADLs Comments: pt remains confused, again indicates that she was managing the home alone just a few months ago     Extremity/Trunk Assessment   Upper Extremity Assessment Upper Extremity Assessment: Generalized weakness    Lower Extremity Assessment Lower Extremity Assessment: Generalized weakness;Overall WFL for tasks assessed       Communication   Communication Communication: Impaired Factors Affecting Communication: Hearing impaired (signficantly)    Cognition Arousal: Alert Behavior During Therapy: Impulsive, Anxious                             Following commands: Impaired Following commands impaired: Follows one step commands inconsistently     Cueing Cueing Techniques: Verbal cues, Tactile cues, Gestural cues     General Comments General comments (skin integrity, edema, etc.): Pt's HR on arrival 90bpm, up to 110s with modest in-room ambulation effort. Pt's confusion and hearing limitations did make for incongruous eval, pt remained pleasant t/o however    Exercises     Assessment/Plan    PT Assessment Patient needs continued PT services  PT Problem List Decreased strength;Decreased activity tolerance;Decreased range of motion;Decreased mobility;Decreased balance;Decreased safety awareness;Decreased cognition;Decreased knowledge of use of DME;Cardiopulmonary status limiting activity       PT Treatment Interventions DME instruction;Gait training;Stair training;Functional mobility  training;Therapeutic activities;Therapeutic exercise;Balance training;Neuromuscular re-education;Cognitive remediation;Patient/family education    PT Goals (Current goals can be found in the Care Plan section)  Acute Rehab PT Goals Patient Stated Goal: Pt wanting to go home PT Goal Formulation: With patient Time For Goal Achievement: 08/03/24 Potential to Achieve Goals: Fair    Frequency Min 1X/week     Co-evaluation               AM-PAC PT 6 Clicks Mobility  Outcome Measure Help needed turning from your back to your side while in a flat bed without using bedrails?: A Little Help needed moving from lying on your back to sitting on the side of a flat bed without using bedrails?: A Little Help needed moving to and from a bed to a chair (including a wheelchair)?: A Little Help needed standing up from a chair using your arms (e.g., wheelchair or bedside chair)?: A Little Help needed to walk in hospital room?: A Little Help needed climbing 3-5 steps with a railing? : A Little 6 Click Score: 18    End of Session Equipment Utilized During Treatment: Gait belt Activity Tolerance: Patient limited by fatigue (confusion) Patient left: with bed  alarm set;with call bell/phone within reach;with nursing/sitter in room Nurse Communication: Mobility status PT Visit Diagnosis: Muscle weakness (generalized) (M62.81);Unsteadiness on feet (R26.81)    Time: 8594-8579 PT Time Calculation (min) (ACUTE ONLY): 15 min   Charges:   PT Evaluation $PT Eval Low Complexity: 1 Low   PT General Charges $$ ACUTE PT VISIT: 1 Visit         Carmin JONELLE Deed, DPT 07/21/2024, 3:00 PM

## 2024-07-21 NOTE — Consult Note (Addendum)
 PHARMACY CONSULT NOTE - ELECTROLYTES  Pharmacy Consult for Electrolyte Monitoring and Replacement   Recent Labs: Height: 5' 5 (165.1 cm) Weight: 74 kg (163 lb 2.3 oz) IBW/kg (Calculated) : 57 Estimated Creatinine Clearance: 40.1 mL/min (A) (by C-G formula based on SCr of 1.09 mg/dL (H)). Potassium (mmol/L)  Date Value  07/21/2024 2.7 (LL)   Magnesium  (mg/dL)  Date Value  89/71/7974 1.5 (L)   Calcium (mg/dL)  Date Value  89/70/7974 7.9 (L)   Albumin (g/dL)  Date Value  89/71/7974 3.9   Phosphorus (mg/dL)  Date Value  89/92/7974 2.5   Sodium (mmol/L)  Date Value  07/21/2024 140    Assessment  Tanya Frye is a 83 y.o. female presenting with acute metabolic encephalopathy. PMH significant for COPD, HFrEF, HTN, DM, PAF, CKD, and dementia. Pharmacy has been consulted to monitor and replace electrolytes.  Diet: heart healthy/carb modified MIVF: N/A Pertinent medications: n/a  Goal of Therapy: Electrolytes WNL  Plan:  K = 2.7, provider ordered Kcl 10 mEq IV x 2, will order additional Kcl 40 mEq po x 1 Mg = 1.5, provider ordered MagSulf 2g IV x 1 Ca = 7.9, give Calcium gluconate 1 gram IV x 1 Check BMP, Mg, Phos with AM labs  Thank you for allowing pharmacy to be a part of this patient's care.  Kayla JULIANNA Blew, PharmD Clinical Pharmacist 07/21/2024 6:56 AM

## 2024-07-21 NOTE — H&P (Signed)
 History and Physical    Patient: Tanya Frye FMW:[id number] DOB: 01-17-1941 DOA: 07/20/2024 DOS: the patient was seen and examined on 07/21/2024 PCP: Zenaida Morene PARAS, MD  Patient coming from: Home  Chief Complaint:  Chief Complaint  Patient presents with   Altered Mental Status    HPI: Tanya Frye is a 83 y.o. female with medical history significant for COPD, chronic HFrEF, HTN, IDDM, PAF on Eliquis , CKD stage IIIa, dementia, being admitted with left lower extremity cellulitis and electrolyte derangement presenting with altered mental status and increased agitation.  She was sent in by [facility]. On arrival she was hypothermic to 95.8 mildly tachycardic to 120 with otherwise normal vitals. 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   CHF (congestive heart failure) (HCC)    Diabetes mellitus without complication (HCC)    HOH (hard of hearing)    extremely HOH   Hypertension    Morbid obesity (HCC) 02/24/2020   Palpitations    occasional related to meds/heart races   Past Surgical History:  Procedure Laterality Date   ABDOMINAL HYSTERECTOMY  1990   CATARACT EXTRACTION W/PHACO Right 12/20/2020   Procedure: CATARACT EXTRACTION PHACO AND INTRAOCULAR LENS PLACEMENT (IOC) RIGHT DIABETIC 8.13 01:17.6 10.5%;  Surgeon: Mittie Gaskin, MD;  Location: [facility];  Service: Ophthalmology;  Laterality: Right;   CATARACT EXTRACTION W/PHACO  Left 01/03/2021   Procedure: CATARACT EXTRACTION PHACO AND INTRAOCULAR LENS PLACEMENT (IOC) LEFT DIABETIC  7.37 01:15.6 9.8%;  Surgeon: Mittie Gaskin, MD;  Location: [facility];  Service: Ophthalmology;  Laterality: Left;   CHOLECYSTECTOMY     DILATION AND CURETTAGE OF UTERUS  before 1990   X2   NECK SURGERY  03/30/2008   Per family, pt broke her neck   STOMACH SURGERY     tumor removed, per family   Social History:  reports that she has never smoked. She has never used smokeless tobacco. She reports that she does not drink alcohol and does not use drugs.  Allergies  Allergen Reactions   Penicillins Rash    Family History  Problem Relation Age of Onset   Asthma Mother    Kidney disease Father     Prior to Admission medications   Medication Sig Start Date End Date Taking? Authorizing Provider  acetaminophen  (TYLENOL ) 650 MG CR tablet Take 650 mg by mouth every 8 (eight) hours as needed for pain.   Yes [provider]  apixaban  (ELIQUIS ) 5 MG TABS tablet Take 1 tablet (5 mg total) by mouth 2 (two) times daily. 02/13/24  Yes Awanda City, MD  ascorbic acid  (VITAMIN C ) 500 MG tablet Take 500 mg by mouth daily.   Yes [provider]  calcium carbonate (OSCAL) 1500 (600 Ca) MG TABS tablet Take 1,500 mg by mouth in the morning and at bedtime.   Yes [provider]  docusate sodium  (COLACE) 100 MG capsule Take 100 mg by mouth 2 (two)  times daily.   Yes [provider]  fluticasone  (FLONASE ) 50 MCG/ACT nasal spray Place 1 spray into both nostrils daily.   Yes [provider]  insulin  glargine-yfgn (SEMGLEE ) 100 UNIT/ML injection Inject 0.08 mLs (8 Units total) into the skin daily. 05/14/24  Yes Laurita Pillion, MD  levothyroxine  (SYNTHROID ) 75 MCG tablet Take 75 mcg by mouth daily before breakfast.   Yes [provider]  loratadine  (CLARITIN ) 10 MG tablet Take 10 mg by mouth daily.   Yes [provider]  LORazepam  (ATIVAN )  0.5 MG tablet Take 0.5 mg by mouth 2 (two) times daily as needed for sedation. 07/05/24  Yes [provider]  losartan  (COZAAR ) 50 MG tablet Take 50 mg by mouth daily. 06/25/24  Yes [provider]  lovastatin (MEVACOR) 10 MG tablet TAKE 1 TABLET ONCE A DAY ORALLY 10/20/17  Yes [provider]  metoprolol  tartrate (LOPRESSOR ) 25 MG tablet Take 1 tablet (25 mg total) by mouth 2 (two) times daily. 05/14/24  Yes Zhang, Dekui, MD  Multiple Vitamin (MULTIVITAMIN WITH MINERALS) TABS tablet Take 1 tablet by mouth daily. 02/14/24  Yes Awanda City, MD  Omega-3 Fatty Acids (FISH OIL) 300 MG CAPS Take 300-1,000 mg by mouth daily.   Yes [provider]  ondansetron  (ZOFRAN -ODT) 4 MG disintegrating tablet Take 4 mg by mouth every 8 (eight) hours as needed for nausea or vomiting.   Yes [provider]  phenol (CHLORASEPTIC) 1.4 % LIQD Use as directed 1 spray in the mouth or throat every 2 (two) hours as needed for throat irritation / pain.   Yes [provider]  polyethylene glycol (MIRALAX  / GLYCOLAX ) 17 g packet Take 17 g by mouth daily as needed for mild constipation.   Yes [provider]  torsemide  (DEMADEX ) 20 MG tablet Take 1 tablet (20 mg total) by mouth daily. 07/03/24  Yes Caleen Qualia, MD  triamcinolone  ointment (KENALOG ) 0.5 % Apply 1 Application topically 2 (two) times daily. 06/14/24  Yes Scoggins, Amber, NP  trolamine salicylate (ASPERCREME) 10 % cream Apply 1 Application topically 3 (three) times daily. Apply to right knee three times a day.   Yes [provider]  digoxin  62.5 MCG TABS Take 0.0625 mg by mouth daily. 05/14/24   Laurita Pillion, MD  feeding supplement (ENSURE ENLIVE / ENSURE PLUS) LIQD Take 237 mLs by mouth 2 (two) times daily between meals. 02/13/24   Awanda City, MD  nystatin powder Apply 1 Application topically 2 (two) times daily. Apply Nystatin powder to red area under right breast and place ABD pad between breast and  chest twice daily Patient not taking: Reported on 07/20/2024    [provider]    Physical Exam: Vitals:   07/20/24 1937 07/20/24 2141 07/21/24 0129 07/21/24 0140  BP:  129/62 (!) 123/56   Pulse:  64 77   Resp:  15 16   Temp: 98.5 F (36.9 C)   (!) 95.8 F (35.4 C)  TempSrc: Rectal  Axillary Rectal  SpO2:  100% 100%   Weight:      Height:       Physical Exam Vitals and nursing note reviewed.  Constitutional:      General: She is not in acute distress.    Comments: Elderly female, restless and somewhat agitated, not cooperative with physical exam. In Bair hugger  HENT:     Head: Normocephalic and atraumatic.  Cardiovascular:     Rate and Rhythm: Normal rate and regular rhythm.  Heart sounds: Normal heart sounds.  Pulmonary:     Effort: Pulmonary effort is normal.     Breath sounds: Normal breath sounds.  Abdominal:     Palpations: Abdomen is soft.     Tenderness: There is no abdominal tenderness.  Neurological:     General: No focal deficit present.     Labs on Admission: I have personally reviewed following labs and imaging studies  CBC: Recent Labs  Lab 07/20/24 1755  WBC 9.8  HGB 14.3  HCT 42.5  MCV 87.6  PLT 321   Basic Metabolic Panel: Recent Labs  Lab 07/20/24 1755  NA 135  K 2.9*  CL 95*  CO2 21*  GLUCOSE 179*  BUN 13  CREATININE 1.31*  CALCIUM 9.2  MG 1.5*   GFR: Estimated Creatinine Clearance: 33.3 mL/min (A) (by C-G formula based on SCr of 1.31 mg/dL (H)). Liver Function Tests: Recent Labs  Lab 07/20/24 1755  AST 43*  ALT 33  ALKPHOS 39  BILITOT 1.1  PROT 7.3  ALBUMIN 3.9   No results for input(s): LIPASE, AMYLASE in the last 168 hours. No results for input(s): AMMONIA in the last 168 hours. Coagulation Profile: No results for input(s): INR, PROTIME in the last 168 hours. Cardiac Enzymes: Recent Labs  Lab 07/20/24 1939  CKTOTAL 61   BNP (last 3 results) No results for input(s): PROBNP in the  last 8760 hours. HbA1C: No results for input(s): HGBA1C in the last 72 hours. CBG: No results for input(s): GLUCAP in the last 168 hours. Lipid Profile: No results for input(s): CHOL, HDL, LDLCALC, TRIG, CHOLHDL, LDLDIRECT in the last 72 hours. Thyroid  Function Tests: Recent Labs    07/20/24 1755  TSH 4.069  FREET4 1.92*   Anemia Panel: No results for input(s): VITAMINB12, FOLATE, FERRITIN, TIBC, IRON, RETICCTPCT in the last 72 hours. Urine analysis:    Component Value Date/Time   COLORURINE YELLOW (A) 07/20/2024 1931   APPEARANCEUR CLEAR (A) 07/20/2024 1931   LABSPEC 1.017 07/20/2024 1931   PHURINE 5.0 07/20/2024 1931   GLUCOSEU NEGATIVE 07/20/2024 1931   HGBUR NEGATIVE 07/20/2024 1931   BILIRUBINUR NEGATIVE 07/20/2024 1931   KETONESUR 5 (A) 07/20/2024 1931   PROTEINUR 30 (A) 07/20/2024 1931   NITRITE NEGATIVE 07/20/2024 1931   LEUKOCYTESUR NEGATIVE 07/20/2024 1931    Radiological Exams on Admission: No results found. Data Reviewed for HPI: Relevant notes from primary care and specialist visits, past discharge summaries as available in EHR, including Care Everywhere. Prior diagnostic testing as pertinent to current admission diagnoses Updated medications and problem lists for reconciliation ED course, including vitals, labs, imaging, treatment and response to treatment Triage notes, nursing and pharmacy notes and ED provider's notes Notable results as noted above in HPI      Assessment and Plan: * Acute metabolic encephalopathy Dementia with behavioral disturbance Currently secondary to acute infection, electrolyte abnormality Neurologic checks fall and aspiration precautions Delirium precautions Haldol as needed  Cellulitis of left lower extremity Skin ulcer right lower extremity, superficial Rocephin  Wound care  Hypokalemia Hypomagnesemia Potassium 2.9 and magnesium  1.5 GERD potassium in the ED Will give oral  magnesium  Pharmacy consult for electrolyte management  Prolonged QT interval Avoid QT prolonging drugs if possible  Chronic HFrEF (heart failure with reduced ejection fraction) (HCC) Clinically euvolemic to dry Continue digoxin , losartan , metoprolol --Will hold torsemide  tonight while being hydrated  Hypertension BP controlled, continue home meds  Stage 3a chronic kidney disease (HCC) Near baseline  Paroxysmal atrial fibrillation (HCC) Chronic anticoagulation  Continue metoprolol  and apixaban   Diabetes mellitus without complication (HCC) Sliding scale insulin  coverage  Hypothyroidism Continue levothyroxine   COPD (chronic obstructive pulmonary disease) (HCC) Not acutely exacerbated DuoNebs as needed     DVT prophylaxis: Lovenox   Consults: none  Advance Care Planning:   Code Status: Limited: Do not attempt resuscitation (DNR) -DNR-LIMITED -Do Not Intubate/DNI    Family Communication: none  Disposition Plan: Back to previous home environment  Severity of Illness: The appropriate patient status for this patient is OBSERVATION. Observation status is judged to be reasonable and necessary in order to provide the required intensity of service to ensure the patient's safety. The patient's presenting symptoms, physical exam findings, and initial radiographic and laboratory data in the context of their medical condition is felt to place them at decreased risk for further clinical deterioration. Furthermore, it is anticipated that the patient will be medically stable for discharge from the hospital within 2 midnights of admission.   Author: Delayne LULLA Solian, MD 07/21/2024 3:25 AM  For on call review [email or web address].

## 2024-07-21 NOTE — Progress Notes (Signed)
 ED Pharmacy Antibiotic Sign Off An antibiotic consult was received from an ED provider for Vancomycin per pharmacy dosing for cellulitis. A chart review was completed to assess appropriateness.   The following one time order(s) were placed:  Vancomycin 1750 mg per pt wt: 74 kg  Further antibiotic and/or antibiotic pharmacy consults should be ordered by the admitting provider if indicated.   Thank you for allowing pharmacy to be a part of this patient's care.   Thank you, Rankin CANDIE Dills, PharmD, [facility] 07/21/2024 1:58 AM

## 2024-07-21 NOTE — NC FL2 (Signed)
 [place]  MEDICAID FL2 LEVEL OF CARE FORM     IDENTIFICATION  Patient Name: Tanya Frye Birthdate: 1941/03/21 Sex: female Admission Date (Current Location): 07/20/2024  [facility] and [place] Number:  Chiropodist and Address:  [facility], [address], [place], [place] [address]      Provider Number: [id number]  Attending Physician Name and Address:  Trudy Anthony HERO, MD  Relative Name and Phone Number:       Current Level of Care: Hospital Recommended Level of Care: Skilled Nursing Facility Prior Approval Number:    Date Approved/Denied:   PASRR Number: [id number]  Discharge Plan: SNF    Current Diagnoses: Patient Active Problem List   Diagnosis Date Noted   Acute metabolic encephalopathy 07/21/2024   Cellulitis of left lower extremity 07/21/2024   Prolonged QT interval 07/21/2024   Dementia with behavioral disturbance (HCC) 07/21/2024   Persistent atrial fibrillation (HCC) 06/30/2024   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   Hypomagnesemia 06/28/2024   Diarrhea 06/28/2024   Stage 3a chronic kidney disease (HCC) 06/28/2024   Physical deconditioning 06/28/2024   Paroxysmal atrial fibrillation (HCC) 06/28/2024   UTI (urinary tract infection) 06/28/2024   AMS (altered mental status) 06/27/2024   Hypokalemia 06/27/2024   Vitamin D  deficiency 06/14/2024   COPD (chronic obstructive pulmonary disease) (HCC) 05/13/2024   Atrial flutter (HCC) 05/12/2024   Chronic HFrEF (heart failure with reduced ejection fraction) (HCC) 05/12/2024   CHF exacerbation (HCC) 05/09/2024   Pressure injury of skin 02/04/2024   Atrial fibrillation with RVR (HCC) 02/01/2024   Sepsis (HCC) 01/29/2024   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   Non-STEMI (non-ST elevated myocardial infarction) (HCC) 01/29/2024   Community acquired pneumonia 01/29/2024   Cardiogenic shock (HCC) 01/29/2024   Acute respiratory failure (HCC) 01/28/2024    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   Tachycardia 08/29/2020   Need for influenza vaccination 05/30/2020   Seasonal allergic rhinitis due to pollen 02/24/2020   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, unsp (HCC) 02/24/2020   Hypertension 02/24/2020   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   Hyperlipidemia, unspecified 02/28/2015   Hypothyroidism 02/28/2015   Diabetes mellitus without complication (HCC) 02/28/2015    Orientation RESPIRATION BLADDER Height & Weight     Self  Normal Continent Weight: 163 lb 2.3 oz (74 kg) Height:  5' 5 (165.1 cm)  BEHAVIORAL SYMPTOMS/MOOD NEUROLOGICAL BOWEL NUTRITION STATUS  Other (Comment) (Agitated, anxious.)  (Dementia) Incontinent Diet (Heart healthy/carb modified)  AMBULATORY STATUS COMMUNICATION OF NEEDS Skin   Limited Assist Verbally Skin abrasions, Other (Comment) (Erythema/redness.)                       Personal Care Assistance Level of Assistance  Bathing, Feeding, Dressing Bathing Assistance: Limited assistance Feeding assistance: Limited assistance Dressing Assistance: Limited assistance     Functional Limitations Info  Sight, Hearing, Speech Sight Info: Adequate Hearing Info: Impaired Speech Info: Adequate    SPECIAL CARE FACTORS FREQUENCY  PT (By licensed PT), OT (By licensed OT)     PT Frequency: 5 x week OT Frequency: 5 x week            Contractures Contractures Info: Not present    Additional Factors Info  Code Status, Allergies Code Status Info: DNR Allergies Info: Penicillins           Current Medications (07/21/2024):  This is the current hospital active medication list Current Facility-Administered Medications  Medication Dose Route Frequency Provider Last  Rate Last Admin   acetaminophen  (TYLENOL ) tablet 650 mg  650 mg Oral Q6H PRN Duncan, Hazel V, MD       Or   acetaminophen  (TYLENOL ) suppository 650 mg  650 mg Rectal Q6H PRN Cleatus Delayne GAILS, MD       albuterol   (PROVENTIL ) (2.5 MG/3ML) 0.083% nebulizer solution 2.5 mg  2.5 mg Nebulization Q2H PRN Duncan, Hazel V, MD       apixaban  (ELIQUIS ) tablet 5 mg  5 mg Oral BID Duncan, Hazel V, MD       [START ON 07/22/2024] cefTRIAXone  (ROCEPHIN ) 1 g in sodium chloride  0.9 % 100 mL IVPB  1 g Intravenous Q24H Trudy Anthony HERO, MD       docusate sodium  (COLACE) capsule 100 mg  100 mg Oral BID Duncan, Hazel V, MD       insulin  aspart (novoLOG ) injection 0-5 Units  0-5 Units Subcutaneous QHS Duncan, Hazel V, MD       insulin  aspart (novoLOG ) injection 0-9 Units  0-9 Units Subcutaneous TID WC Duncan, Hazel V, MD       insulin  glargine-yfgn (SEMGLEE ) injection 8 Units  8 Units Subcutaneous Daily Cleatus Delayne GAILS, MD       levothyroxine  (SYNTHROID ) tablet 75 mcg  75 mcg Oral Q0600 Dail Rankin RAMAN, [facility]       LORazepam  (ATIVAN ) tablet 0.5 mg  0.5 mg Oral BID PRN Duncan, Hazel V, MD       losartan  (COZAAR ) tablet 50 mg  50 mg Oral Daily Duncan, Hazel V, MD       magnesium  sulfate IVPB 2 g 50 mL  2 g Intravenous Once Cyrena Mylar, MD       metoprolol  tartrate (LOPRESSOR ) tablet 25 mg  25 mg Oral BID Cleatus Delayne GAILS, MD       potassium chloride  10 mEq in 100 mL IVPB  10 mEq Intravenous Q1 Hr x 4 Trudy Anthony HERO, MD       pravastatin  (PRAVACHOL ) tablet 10 mg  10 mg Oral q1800 Duncan, Hazel V, MD       prochlorperazine (COMPAZINE) injection 5 mg  5 mg Intravenous Q4H PRN Duncan, Hazel V, MD         Discharge Medications: Please see discharge summary for a list of discharge medications.  Relevant Imaging Results:  Relevant Lab Results:   Additional Information SS#: [id number]  Lauraine JAYSON Carpen, LCSW

## 2024-07-21 NOTE — ED Notes (Signed)
Bear Hugger applied to pt.

## 2024-07-21 NOTE — Assessment & Plan Note (Signed)
 Continue levothyroxine 

## 2024-07-21 NOTE — ED Provider Notes (Addendum)
 Rectal temperature checked given patient is complaining that she is very cold despite warm blankets.  Multiple checks 95 Fahrenheit.  Will apply Bair hugger.    Check thyroid , cortisol, EKG.  Keep on cardiac monitoring.    Reviewed other labs, no leukocytosis, urinalysis negative for infection, no other complaints of infectious symptoms like respiratory infection, GI complaints.    Hypokalemia.  Checking magnesium .  Got an EKG which shows QTc prolongation over 600.  Keep on cardiac monitoring.  Potassium repletion ordered  Her left calf and behind the ankle do appear to have red, warmth, cellulitic changes.    Given her altered mental status worsening, as well as hypothermia and the cellulitic changes will treat for cellulitis with IV antibiotics.  Initial vital signs tachycardia now resolved with fluid bolus, with tachycardia, hypothermia, and suspected infection does technically meet sepsis criteria though vital signs have been completely normalized now aside from the hypothermia.  Check lactic and blood cultures.  Admission.    PROCEDURES:  Critical Care performed: Yes, see critical care procedure note(s)  .Critical Care  Performed by: Cyrena Mylar, MD Authorized by: Cyrena Mylar, MD   Critical care provider statement:    Critical care time (minutes):  30   Critical care was time spent personally by me on the following activities:  Development of treatment plan with patient or surrogate, discussions with consultants, evaluation of patient's response to treatment, examination of patient, ordering and review of laboratory studies, ordering and review of radiographic studies, ordering and performing treatments and interventions, pulse oximetry, re-evaluation of patient's condition and review of old charts       Cyrena Mylar, MD 07/21/24 RAYMONDE    Cyrena Mylar, MD 07/21/24 [phone]

## 2024-07-21 NOTE — Progress Notes (Signed)
 PROGRESS NOTE    Tanya Frye  FMW:[id number] DOB: Nov 22, 1940 DOA: 07/20/2024 PCP: Zenaida Morene PARAS, MD   Assessment & Plan:   Principal Problem:   Acute metabolic encephalopathy Active Problems:   Cellulitis of left lower extremity   Dementia with behavioral disturbance (HCC)   Hypokalemia   Hypomagnesemia   Prolonged QT interval   Chronic HFrEF (heart failure with reduced ejection fraction) (HCC)   Hypertension   Stage 3a chronic kidney disease (HCC)   Paroxysmal atrial fibrillation (HCC)   Diabetes mellitus without complication (HCC)   Hypothyroidism   COPD (chronic obstructive pulmonary disease) (HCC)  Assessment and Plan: Acute metabolic encephalopathy: likely secondary to infection, LLE cellulitis. Hx of dementia. Re-orient prn. Continue on IV rocephin .  Dementia: w/ behavioral disturbance. Refusing to take home meds. Psych consulted. Haldol prn    Cellulitis of left lower extremity: w/ superficial skin ulcer. Continue on IV rocephin  and wound care  Hypokalemia: potassium ordered.  Hypomagnesemia: mg repleted yetesterday    Prolonged QT interval: continue on tele.    Chronic systolic CHF: appears euvolemic. Continue on home dose of metoprolol , losartan , digoxin . Holding torsemide . Monitor I/Os   HTN: continue on metoprolol , losartan    CKDIIIa: Cr is trending down today. Avoid nephrotoxic meds    PAF: continue on metoprolol , eliquis    DM2: fair control, HbA1c 7.1. Continue on SSI w/ accuchecks    Hypothyroidism: continue on home dose of levothyroxine    COPD: w/o exacerbation. Bronchodilators prn       DVT prophylaxis: eliquis  Code Status: DNR Family Communication:  Disposition Plan: likely d/c back to home facility   Level of care: Progressive  Status is: Inpatient Remains inpatient appropriate because: severity of illness    Consultants:  Psych   Procedures:   Antimicrobials: rocephin    Subjective: Pt is pleasantly  confused  Objective: Vitals:   07/21/24 0140 07/21/24 0400 07/21/24 0454 07/21/24 0809  BP:  (!) 95/54 102/61 (!) 102/59  Pulse:  68 88 81  Resp:  13 18 14   Temp: (!) 95.8 F (35.4 C) 98.4 F (36.9 C)  (!) 97.5 F (36.4 C)  TempSrc: Rectal Axillary  Axillary  SpO2:  100% 100% 99%  Weight:      Height:        Intake/Output Summary (Last 24 hours) at 07/21/2024 0956 Last data filed at 07/21/2024 0440 Gross per 24 hour  Intake 460 ml  Output 100 ml  Net 360 ml   Filed Weights   07/20/24 1747  Weight: 74 kg    Examination:  General exam: Appears calm and comfortable  Respiratory system: Clear to auscultation. Respiratory effort normal. Cardiovascular system: S1 & S2+. No rubs, gallops or clicks.  Gastrointestinal system: Abdomen is nondistended, soft and nontender.  Normal bowel sounds heard. Central nervous system: Alert and awake. Psychiatry: Judgement and insight appears poor.     Data Reviewed: I have personally reviewed following labs and imaging studies  CBC: Recent Labs  Lab 07/20/24 1755 07/21/24 0423  WBC 9.8 6.8  HGB 14.3 10.8*  HCT 42.5 32.8*  MCV 87.6 90.6  PLT 321 187   Basic Metabolic Panel: Recent Labs  Lab 07/20/24 1755 07/21/24 0423  NA 135 140  K 2.9* 2.7*  CL 95* 101  CO2 21* 23  GLUCOSE 179* 88  BUN 13 11  CREATININE 1.31* 1.09*  CALCIUM 9.2 7.9*  MG 1.5*  --    GFR: Estimated Creatinine Clearance: 40.1 mL/min (A) (by C-G formula based on  SCr of 1.09 mg/dL (H)). Liver Function Tests: Recent Labs  Lab 07/20/24 1755  AST 43*  ALT 33  ALKPHOS 39  BILITOT 1.1  PROT 7.3  ALBUMIN 3.9   No results for input(s): LIPASE, AMYLASE in the last 168 hours. No results for input(s): AMMONIA in the last 168 hours. Coagulation Profile: No results for input(s): INR, PROTIME in the last 168 hours. Cardiac Enzymes: Recent Labs  Lab 07/20/24 1939  CKTOTAL 61   BNP (last 3 results) No results for input(s): PROBNP in the  last 8760 hours. HbA1C: No results for input(s): HGBA1C in the last 72 hours. CBG: Recent Labs  Lab 07/21/24 0809  GLUCAP 93   Lipid Profile: No results for input(s): CHOL, HDL, LDLCALC, TRIG, CHOLHDL, LDLDIRECT in the last 72 hours. Thyroid  Function Tests: Recent Labs    07/20/24 1755  TSH 4.069  FREET4 1.92*   Anemia Panel: No results for input(s): VITAMINB12, FOLATE, FERRITIN, TIBC, IRON, RETICCTPCT in the last 72 hours. Sepsis Labs: Recent Labs  Lab 07/21/24 0215 07/21/24 0342  LATICACIDVEN 1.6 1.2    Recent Results (from the past 240 hours)  Culture, blood (routine x 2)     Status: None (Preliminary result)   Collection Time: 07/21/24  2:15 AM   Specimen: BLOOD  Result Value Ref Range Status   Specimen Description BLOOD RIGHT ARM  Final   Special Requests   Final    BOTTLES DRAWN AEROBIC AND ANAEROBIC Blood Culture results may not be optimal due to an inadequate volume of blood received in culture bottles   Culture   Final    NO GROWTH < 12 HOURS Performed at [facility], [address] [place], [place] [address]    Report Status PENDING  Incomplete  Culture, blood (routine x 2)     Status: None (Preliminary result)   Collection Time: 07/21/24  2:16 AM   Specimen: BLOOD  Result Value Ref Range Status   Specimen Description BLOOD LEFT ARM  Final   Special Requests   Final    BOTTLES DRAWN AEROBIC AND ANAEROBIC Blood Culture adequate volume   Culture   Final    NO GROWTH < 12 HOURS Performed at [facility], [address], [place], [place] [address]    Report Status PENDING  Incomplete         Radiology Studies: No results found.      Scheduled Meds:  apixaban   5 mg Oral BID   docusate sodium   100 mg Oral BID   insulin  aspart  0-5 Units Subcutaneous QHS   insulin  aspart  0-9 Units Subcutaneous TID WC   insulin  glargine-yfgn  8 Units Subcutaneous Daily   levothyroxine   75 mcg Oral Q0600    losartan   50 mg Oral Daily   metoprolol  tartrate  25 mg Oral BID   potassium chloride   40 mEq Oral Once   pravastatin   10 mg Oral q1800   Continuous Infusions:  calcium gluconate     magnesium  sulfate bolus IVPB       LOS: 0 days      Anthony CHRISTELLA Pouch, MD [facility] Pager [phone]  If 7PM-7AM, please contact night-coverage [email or web address] 07/21/2024, 9:56 AM

## 2024-07-21 NOTE — Assessment & Plan Note (Signed)
 Not acutely exacerbated DuoNebs as needed

## 2024-07-21 NOTE — Assessment & Plan Note (Signed)
 BP controlled, continue home meds

## 2024-07-21 NOTE — Evaluation (Signed)
 Occupational Therapy Evaluation Patient Details Name: Tanya Frye MRN: [id number] DOB: [birth year] Today's Date: 07/21/2024   History of Present Illness   Tanya Frye is a 83 y.o. female with medical history significant for COPD, chronic HFrEF, HTN, IDDM, PAF on Eliquis , CKD stage IIIa, dementia, being admitted with left lower extremity cellulitis and electrolyte derangement presenting with altered mental status and increased agitation.  She was sent in by [facility].     Clinical Impressions Patient was seen for OT evaluation this date. Prior to hospital admission, [facility] was at [facility] for re[facility] after recent hospitalization. Per chart review, patient lives alone with interim support. Patient seated EOB upon OT arrival, oriented only to self, perseverating on wanting to walk, OT facilitated mobility with r/w, patient ambulated ~120 feet with 1 seated rest break; max cues for direction changes, to maintain attention to task and for posture/body mechanics. Patient reports needing to urinate, has purewick placed and wants OT and NT to hook it back up; OT encouraging patient to urinate on toilet/BSC to improve activity tolerance and reduce risk of PU, patient reluctant but overall agreeable. PT present at this time, handoff completed. Patient presents with significant cognitive deficits and limited activity tolerance which is affecting safe and optimal ADL completion. Patient is currently requiring mod-max A for ADLs.  Paient would benefit from skilled OT services to address noted impairments and functional limitations (see below for any additional details) in order to maximize safety and independence while minimizing future risk of falls, injury, and readmission. Anticipate the need for follow up OT services upon acute hospital DC.      If plan is discharge home, recommend the following:   A little help with walking and/or transfers;A little help with  bathing/dressing/bathroom;Assistance with cooking/housework;Direct supervision/assist for medications management;Direct supervision/assist for financial management;Assist for transportation;Supervision due to cognitive status;Help with stairs or ramp for entrance     Functional Status Assessment   Patient has had a recent decline in their functional status and/or demonstrates limited ability to make significant improvements in function in a reasonable and predictable amount of time     Equipment Recommendations   Other (comment) (defer to next venue of care)     Recommendations for Other Services         Precautions/Restrictions   Precautions Precautions: Fall Recall of Precautions/Restrictions: Impaired Restrictions Weight Bearing Restrictions Per Provider Order: No     Mobility Bed Mobility Overal bed mobility: Needs Assistance Bed Mobility: Sit to Supine       Sit to supine: Contact guard assist        Transfers Overall transfer level: Needs assistance Equipment used: Rolling walker (2 wheels) Transfers: Sit to/from Stand Sit to Stand: Min assist, Contact guard assist                  Balance Overall balance assessment: Needs assistance Sitting-balance support: Feet supported, No upper extremity supported Sitting balance-Leahy Scale: Good     Standing balance support: During functional activity Standing balance-Leahy Scale: Fair                             ADL either performed or assessed with clinical judgement   ADL Overall ADL's : Needs assistance/impaired  General ADL Comments: A with all tasks due to cognitive impairment     Vision         Perception         Praxis         Pertinent Vitals/Pain Pain Assessment Pain Assessment: No/denies pain     Extremity/Trunk Assessment Upper Extremity Assessment Upper Extremity Assessment: Generalized weakness    Lower Extremity Assessment Lower Extremity Assessment: Defer to PT evaluation       Communication Communication Communication: Impaired Factors Affecting Communication: Hearing impaired   Cognition Arousal: Alert Behavior During Therapy: Impulsive, Anxious, Agitated Cognition: Cognition impaired, History of cognitive impairments   Orientation impairments: Place, Situation, Time Awareness: Intellectual awareness impaired Memory impairment (select all impairments): Short-term memory, Working civil service fast streamer, Non-declarative long-term memory, Geneticist, Molecular long-term memory Attention impairment (select first level of impairment): Focused attention, Sustained attention, Selective attention, Alternating attention, Divided attention Executive functioning impairment (select all impairments): Initiation, Organization, Sequencing, Reasoning, Problem solving OT - Cognition Comments: patient alert, oriented only to self- perseverates throughout session and very difficult to redirect                 Following commands: Impaired Following commands impaired: Follows one step commands inconsistently     Cueing  General Comments   Cueing Techniques: Verbal cues;Tactile cues;Gestural cues      Exercises     Shoulder Instructions      Home Living Family/patient expects to be discharged to:: Skilled nursing facility Living Arrangements: Other (Comment) (patient poor historian, admitted from SNF)                                      Prior Functioning/Environment Prior Level of Function : Patient poor historian/Family not available                    OT Problem List: Decreased strength;Decreased activity tolerance;Impaired balance (sitting and/or standing)   OT Treatment/Interventions: Self-care/ADL training;Therapeutic exercise;Therapeutic activities;Cognitive remediation/compensation;DME and/or AE instruction;Energy conservation;Balance training;Patient/family  education      OT Goals(Current goals can be found in the care plan section)   Acute Rehab OT Goals Patient Stated Goal: none stated OT Goal Formulation: Patient unable to participate in goal setting Time For Goal Achievement: 08/04/24 Potential to Achieve Goals: Fair ADL Goals Pt Will Perform Grooming: with set-up;with supervision;sitting;standing Pt Will Perform Lower Body Dressing: with contact guard assist;with min assist;sit to/from stand Pt Will Transfer to Toilet: with contact guard assist;with supervision;ambulating;bedside commode;grab bars   OT Frequency:  Min 1X/week    Co-evaluation              AM-PAC OT 6 Clicks Daily Activity     Outcome Measure Help from another person eating meals?: None Help from another person taking care of personal grooming?: A Little Help from another person toileting, which includes using toliet, bedpan, or urinal?: A Little Help from another person bathing (including washing, rinsing, drying)?: A Little Help from another person to put on and taking off regular upper body clothing?: A Lot Help from another person to put on and taking off regular lower body clothing?: A Lot 6 Click Score: 17   End of Session Equipment Utilized During Treatment: Rolling walker (2 wheels) Nurse Communication: Mobility status  Activity Tolerance: Patient tolerated treatment well Patient left: in bed;with nursing/sitter in room  OT Visit Diagnosis: Other abnormalities of gait and mobility (R26.89);Muscle weakness (generalized) (  M62.81);Other symptoms and signs involving cognitive function                Time: 1340-1405 OT Time Calculation (min): 25 min Charges:  OT General Charges $OT Visit: 1 Visit OT Evaluation $OT Eval Low Complexity: 1 Low OT Treatments $Self Care/Home Management : 23-37 mins  Rogers Clause, OT/L MSOT, 07/21/2024

## 2024-07-21 NOTE — Assessment & Plan Note (Addendum)
 Skin ulcer right lower extremity, superficial Rocephin  Wound care

## 2024-07-21 NOTE — Assessment & Plan Note (Signed)
Chronic anticoagulation Continue metoprolol and apixaban

## 2024-07-22 DIAGNOSIS — E039 Hypothyroidism, unspecified: Secondary | ICD-10-CM | POA: Diagnosis not present

## 2024-07-22 DIAGNOSIS — I5022 Chronic systolic (congestive) heart failure: Secondary | ICD-10-CM | POA: Diagnosis not present

## 2024-07-22 DIAGNOSIS — R9341 Abnormal radiologic findings on diagnostic imaging of renal pelvis, ureter, or bladder: Secondary | ICD-10-CM

## 2024-07-22 DIAGNOSIS — F03918 Unspecified dementia, unspecified severity, with other behavioral disturbance: Secondary | ICD-10-CM | POA: Diagnosis not present

## 2024-07-22 DIAGNOSIS — G9341 Metabolic encephalopathy: Secondary | ICD-10-CM | POA: Diagnosis not present

## 2024-07-22 LAB — GLUCOSE, CAPILLARY
Glucose-Capillary: 122 mg/dL — ABNORMAL HIGH (ref 70–99)
Glucose-Capillary: 82 mg/dL (ref 70–99)
Glucose-Capillary: 109 mg/dL — ABNORMAL HIGH (ref 70–99)
Glucose-Capillary: 105 mg/dL — ABNORMAL HIGH (ref 70–99)

## 2024-07-22 LAB — PHOSPHORUS: Phosphorus: 2.7 mg/dL (ref 2.5–4.6)

## 2024-07-22 LAB — BASIC METABOLIC PANEL WITH GFR
Anion gap: 8 (ref 5–15)
BUN: 10 mg/dL (ref 8–23)
CO2: 25 mmol/L (ref 22–32)
Calcium: 8.5 mg/dL — ABNORMAL LOW (ref 8.9–10.3)
Chloride: 105 mmol/L (ref 98–111)
Creatinine, Ser: 1.08 mg/dL — ABNORMAL HIGH (ref 0.44–1.00)
GFR, Estimated: 51 mL/min — ABNORMAL LOW (ref >60)
Glucose, Bld: 105 mg/dL — ABNORMAL HIGH (ref 70–99)
Potassium: 3.9 mmol/L (ref 3.5–5.1)
Sodium: 138 mmol/L (ref 135–145)

## 2024-07-22 LAB — CBC
HCT: 38 % (ref 36.0–46.0)
Hemoglobin: 12 g/dL (ref 12.0–15.0)
MCH: 29.1 pg (ref 26.0–34.0)
MCHC: 31.6 g/dL (ref 30.0–36.0)
MCV: 92 fL (ref 80.0–100.0)
Platelets: 218 K/uL (ref 150–400)
RBC: 4.13 MIL/uL (ref 3.87–5.11)
RDW: 14.9 % (ref 11.5–15.5)
WBC: 7.9 K/uL (ref 4.0–10.5)
nRBC: 0 % (ref 0.0–0.2)

## 2024-07-22 LAB — MAGNESIUM: Magnesium: 1.7 mg/dL (ref 1.7–2.4)

## 2024-07-22 MED ORDER — PHENOL 1.4 % MT LIQD
1.0000 | OROMUCOSAL | Status: DC | PRN
  Administered 2024-07-23: 1 via OROMUCOSAL
  Filled 2024-07-22: qty 177

## 2024-07-22 MED ORDER — HALOPERIDOL LACTATE 5 MG/ML IJ SOLN
5.0000 mg | Freq: Four times a day (QID) | INTRAMUSCULAR | Status: DC | PRN
  Administered 2024-07-22 – 2024-07-23 (×3): 5 mg via INTRAMUSCULAR
  Filled 2024-07-22 (×3): qty 1

## 2024-07-22 MED ORDER — DIVALPROEX SODIUM 125 MG PO CSDR: 250.0000 mg | DELAYED_RELEASE_CAPSULE | Freq: Two times a day (BID) | ORAL | Status: DC

## 2024-07-22 NOTE — Progress Notes (Signed)
 Patient refuses telemetry since 0500.  She has also refused all medications.  Two and one-half Potassium chloride  bags were successfully administered before both peripheral IV went bad.  Elmo Rio, RN

## 2024-07-22 NOTE — Progress Notes (Signed)
 Patient continues to refuse cardiac monitoring and PO medications. MD aware.

## 2024-07-22 NOTE — Progress Notes (Signed)
 Patient is currently refusing all care to include: PO meds, tele, IV administration. Patient has pulled 2 of her 3 Ivs out. Patient is agitated with staff and is restless. Pacing in room. MD notified. PRN order received. Unable to carry out medication orders at this time.

## 2024-07-22 NOTE — Progress Notes (Signed)
 During today's shift patient continued to be restless with bouts of agitation. Safety sitter in room for redirection. Patient did accept IV medications.

## 2024-07-22 NOTE — Progress Notes (Signed)
 Patient refused PO morning meds.

## 2024-07-22 NOTE — Progress Notes (Signed)
 PROGRESS NOTE    Tanya Frye  FMW:[id number] DOB: 1941/06/01 DOA: 07/20/2024 PCP: Zenaida Morene PARAS, MD   Assessment & Plan:   Principal Problem:   Acute metabolic encephalopathy Active Problems:   Cellulitis of left lower extremity   Dementia with behavioral disturbance (HCC)   Hypokalemia   Hypomagnesemia   Prolonged QT interval   Chronic HFrEF (heart failure with reduced ejection fraction) (HCC)   Hypertension   Stage 3a chronic kidney disease (HCC)   Paroxysmal atrial fibrillation (HCC)   Diabetes mellitus without complication (HCC)   Hypothyroidism   COPD (chronic obstructive pulmonary disease) (HCC)  Assessment and Plan: Acute metabolic encephalopathy: likely secondary to infection, LLE cellulitis. Hx of dementia.Re-orient prn. Continue on IV rocephin .  Dementia: w/ behavioral disturbance. Still refusing to take meds Does not meet inpatient psych criteria as per psych. Psych signed off 07/21/24. Haldol prn    Cellulitis of left lower extremity: w/ superficial skin ulcer. Continue on IV rocephin  and wound care   Hypokalemia: WNL today   Hypomagnesemia: mg repleted yetesterday    Prolonged QT interval: continue on tele.    Chronic systolic CHF: appears euvolemic. Continue on home dose of metoprolol , losartan , digoxin . Holding torsemide . Monitor I/Os.    HTN: continue on losartan , metoprolol     CKDIIIa: Cr is labile. Avoid nephrotoxic meds   PAF: continue on eliquis , metoprolol     DM2: fair control, HbA1c 7.1. Continue on SSI w/ accuchecks    Hypothyroidism: continue on home dose of levothyroxine     COPD: w/o exacerbation. Bronchodilators prn       DVT prophylaxis: eliquis  Code Status: DNR Family Communication: discussed pt's care w/ pt's daughter in law, Jodie, and answered her questions  Disposition Plan: likely d/c back to home facility   Level of care: Progressive  Status is: Inpatient Remains inpatient appropriate because: severity of  illness    Consultants:  Psych   Procedures:   Antimicrobials: rocephin    Subjective: Pt is still pleasantly confused  Objective: Vitals:   07/21/24 1620 07/21/24 1915 07/22/24 0039 07/22/24 0719  BP: (!) 107/55 (!) 150/74 (!) 100/41 (!) 137/101  Pulse: 91 95 84 (!) 51  Resp: 18 20 20 17   Temp: 97.7 F (36.5 C) 98.2 F (36.8 C) 98.1 F (36.7 C) 98.1 F (36.7 C)  TempSrc:  Oral Oral   SpO2: 100% 100% 100% 99%  Weight:      Height:        Intake/Output Summary (Last 24 hours) at 07/22/2024 0822 Last data filed at 07/21/2024 1800 Gross per 24 hour  Intake 436.44 ml  Output --  Net 436.44 ml   Filed Weights   07/20/24 1747  Weight: 74 kg    Examination:  General exam: appears comfortable but confused Respiratory system: clear breath sounds b/l  Cardiovascular system: S1/S2+. No rubs or clicks  Gastrointestinal system: abd is soft, NT, ND & hypoactive bowel sounds Central nervous system: alert and awake. Moves all extremities  Psychiatry: judgement and insight appears poor.     Data Reviewed: I have personally reviewed following labs and imaging studies  CBC: Recent Labs  Lab 07/20/24 1755 07/21/24 0423 07/22/24 0419  WBC 9.8 6.8 7.9  HGB 14.3 10.8* 12.0  HCT 42.5 32.8* 38.0  MCV 87.6 90.6 92.0  PLT 321 187 218   Basic Metabolic Panel: Recent Labs  Lab 07/20/24 1755 07/21/24 0423 07/22/24 0419  NA 135 140 138  K 2.9* 2.7* 3.9  CL 95* 101 105  CO2 21* 23 25  GLUCOSE 179* 88 105*  BUN 13 11 10   CREATININE 1.31* 1.09* 1.08*  CALCIUM 9.2 7.9* 8.5*  MG 1.5*  --  1.7  PHOS  --   --  2.7   GFR: Estimated Creatinine Clearance: 40.4 mL/min (A) (by C-G formula based on SCr of 1.08 mg/dL (H)). Liver Function Tests: Recent Labs  Lab 07/20/24 1755  AST 43*  ALT 33  ALKPHOS 39  BILITOT 1.1  PROT 7.3  ALBUMIN 3.9   No results for input(s): LIPASE, AMYLASE in the last 168 hours. No results for input(s): AMMONIA in the last 168  hours. Coagulation Profile: No results for input(s): INR, PROTIME in the last 168 hours. Cardiac Enzymes: Recent Labs  Lab 07/20/24 1939  CKTOTAL 61   BNP (last 3 results) No results for input(s): PROBNP in the last 8760 hours. HbA1C: No results for input(s): HGBA1C in the last 72 hours. CBG: Recent Labs  Lab 07/21/24 0809 07/21/24 1139 07/21/24 1616 07/21/24 2235  GLUCAP 93 154* 121* 120*   Lipid Profile: No results for input(s): CHOL, HDL, LDLCALC, TRIG, CHOLHDL, LDLDIRECT in the last 72 hours. Thyroid  Function Tests: Recent Labs    07/20/24 1755  TSH 4.069  FREET4 1.92*   Anemia Panel: No results for input(s): VITAMINB12, FOLATE, FERRITIN, TIBC, IRON, RETICCTPCT in the last 72 hours. Sepsis Labs: Recent Labs  Lab 07/21/24 0215 07/21/24 0342  LATICACIDVEN 1.6 1.2    Recent Results (from the past 240 hours)  Culture, blood (routine x 2)     Status: None (Preliminary result)   Collection Time: 07/21/24  2:15 AM   Specimen: BLOOD  Result Value Ref Range Status   Specimen Description BLOOD RIGHT ARM  Final   Special Requests   Final    BOTTLES DRAWN AEROBIC AND ANAEROBIC Blood Culture results may not be optimal due to an inadequate volume of blood received in culture bottles   Culture   Final    NO GROWTH 1 DAY Performed at [facility], [address], [place], [place] [address]    Report Status PENDING  Incomplete  Culture, blood (routine x 2)     Status: None (Preliminary result)   Collection Time: 07/21/24  2:16 AM   Specimen: BLOOD  Result Value Ref Range Status   Specimen Description BLOOD LEFT ARM  Final   Special Requests   Final    BOTTLES DRAWN AEROBIC AND ANAEROBIC Blood Culture adequate volume   Culture   Final    NO GROWTH 1 DAY Performed at [facility], [address], [place], [place] [address]    Report Status PENDING  Incomplete         Radiology Studies: No results  found.      Scheduled Meds:  apixaban   5 mg Oral BID   docusate sodium   100 mg Oral BID   insulin  aspart  0-5 Units Subcutaneous QHS   insulin  aspart  0-9 Units Subcutaneous TID WC   insulin  glargine-yfgn  8 Units Subcutaneous Daily   levothyroxine   75 mcg Oral Q0600   losartan   50 mg Oral Daily   metoprolol  tartrate  25 mg Oral BID   pravastatin   10 mg Oral q1800   Continuous Infusions:  cefTRIAXone  (ROCEPHIN )  IV     magnesium  sulfate bolus IVPB       LOS: 1 day      Anthony CHRISTELLA Pouch, MD [facility] Pager [phone]  If 7PM-7AM, please  contact night-coverage [email or web address] 07/22/2024, 8:22 AM

## 2024-07-22 NOTE — Consult Note (Signed)
 [facility] Psychiatric Consult Initial  Patient Name: .Tanya Frye  MRN: [id number]  DOB: 12-25-1940  Consult Order details:  Orders (From admission, onward)     Start     Ordered   07/20/24 2123  IP CONSULT TO PSYCHIATRY       Ordering Provider: Dorothyann Drivers, MD  Provider:  (Not yet assigned)  Question Answer Comment  Consult Timeframe ROUTINE - requires response within 24 hours   Reason for Consult? Consult for medication management   Contact phone number where the requesting provider can be reached [phone]      07/20/24 2122             Mode of Visit: In person    Psychiatry Consult Evaluation  Service Date: July 22, 2024 LOS:  LOS: 1 day  Chief Complaint Increased confusion and aggression reported by nursing facility  Primary Psychiatric Diagnoses  Dementia with behavioral disturbance    Assessment  Tanya Frye is a 83 y.o. female admitted: Medically  Patient was consulted to psychiatry due to having diagnosed dementia with behavioral disturbances.  Per review of nursing notes, it appears patient has been refusing to take her home medications or her medications that were prescribed for her UTI.  Per review of notes, it appears that this instance is the first time that patient has become physical with the staff at her residence as well as being aggressive on initial presentation to the emergency department requiring IM agitation meds.    07/22/24: Patient remains confused.  Patient has hard of hearing and responded to the questions by writing.  After 2 questions she started scribbling on the paper.  Per sitter patient remains irritable and constantly tries to get out of the bed.  Given the prolonged QTc we continue to recommend Ativan  as needed.  LFTs are slightly elevated with increased ALT.  Repeat LFTs will recommend to consider Depakote sprinkles to 50 mg twice daily. 07/21/24:On assessment today, patient was able to tell me her name and birthday as well as  that she was at the hospital and the current year was 2025.  She was also able to accurately tell me the upcoming holidays being Halloween and Thanksgiving.  Patient's current QTc is noted to be 656 which limits psychiatry's ability significantly to intervene with medications at this time.  We will continue to monitor patient's behavior while hospitalized medically.  We do recommend to continue with the 0.5 mg Ativan  2 times daily as needed due to patient's significantly prolonged QTc.  If behaviors increase or worsen, we discussed with medical team about potentially adding Depakote sprinkles.  We will continue to monitor patient while medically admitted to assess for need of this medication.  Diagnoses:  Active Hospital problems: Principal Problem:   Acute metabolic encephalopathy Active Problems:   Hypertension   Chronic HFrEF (heart failure with reduced ejection fraction) (HCC)   COPD (chronic obstructive pulmonary disease) (HCC)   Hypothyroidism   Diabetes mellitus without complication (HCC)   Hypokalemia   Hypomagnesemia   Stage 3a chronic kidney disease (HCC)   Paroxysmal atrial fibrillation (HCC)   Cellulitis of left lower extremity   Prolonged QT interval   Dementia with behavioral disturbance (HCC)    Plan   ## Psychiatric Medication Recommendations:  Continue ativan  0.5mg  2 times daily PRN due to patient qtc  ## Medical Decision Making Capacity: Not specifically addressed in this encounter  ## Further Work-up:   -- most recent EKG on 07/21/2024 had QtC  of 656 -- Pertinent labwork reviewed earlier this admission includes: cbc, cmp   ## Disposition:-- There are no psychiatric contraindications to discharge at this time  ## Behavioral / Environmental: -Delirium Precautions: Delirium Interventions for Nursing and Staff: - RN to open blinds every AM. - To Bedside: Glasses, hearing aide, and pt's own shoes. Make available to patients. when possible and encourage use. -  Encourage po fluids when appropriate, keep fluids within reach. - OOB to chair with meals. - Passive ROM exercises to all extremities with AM & PM care. - RN to assess orientation to person, time and place QAM and PRN. - Recommend extended visitation hours with familiar family/friends as feasible. - Staff to minimize disturbances at night. Turn off television when pt asleep or when not in use.    ## Safety and Observation Level:  - Based on my clinical evaluation, I estimate the patient to be at low risk of self harm in the current setting. - At this time, we recommend  1:1 Observation. This decision is based on my review of the chart including patient's history and current presentation, interview of the patient, mental status examination, and consideration of suicide risk including evaluating suicidal ideation, plan, intent, suicidal or self-harm behaviors, risk factors, and protective factors. This judgment is based on our ability to directly address suicide risk, implement suicide prevention strategies, and develop a safety plan while the patient is in the clinical setting. Please contact our team if there is a concern that risk level has changed.  CSSR Risk Category:C-SSRS RISK CATEGORY: No Risk  Suicide Risk Assessment: Patient has following modifiable risk factors for suicide: N/A, which we are addressing by using therapeutic communication when talking with patient. Patient has following non-modifiable or demographic risk factors for suicide: N/A Patient has the following protective factors against suicide: Supportive family, no history of suicide attempts, and no history of NSSIB  Thank you for this consult request. Recommendations have been communicated to the primary team.  We will sign off at this time.   Kaysi Ourada.MD        History of Present Illness  Relevant Aspects of Hospital Hospital   Patient Report:  07/22/24: Patient is noted to be resting in bed.  She talks about just  needing her blanket and to sandals.  She is hard of hearing and most of the questions are written on the paper.  She was unable to give details about her hospitalization.  Patient is on thin liquids and per sitter patient is able to take the liquids.  Per sitter patient is constantly trying to get out of the bed and wander around.  Patient is irritable but not physically aggressive.  Patient is not displaying any self-harming behaviors at this time. 07/21/24:Patient was consulted to psychiatry due to having diagnosed dementia with behavioral disturbances.  Per review of nursing notes, it appears patient has been refusing to take her home medications or her medications that were prescribed for her UTI.  Per review of notes, it appears that this instance is the first time that patient has become physical with the staff at her residence as well as being aggressive on initial presentation to the emergency department requiring IM agitation meds.  On assessment today, patient was able to tell me her name and birthday as well as that she was at the hospital and the current year was 2025.  She was also able to accurately tell me the upcoming holidays being Halloween and Thanksgiving.  Patient's current  QTc is noted to be 656 which limits psychiatry's ability significantly to intervene with medications at this time.  We will continue to monitor patient's behavior while hospitalized medically.  We do recommend to continue with the 0.5 mg Ativan  2 times daily as needed due to patient's significantly prolonged QTc.  If behaviors increase or worsen, we discussed with medical team about potentially adding Depakote sprinkles. We will have to discuss this with patient POA or emergency contact due to patient current mental status. We will continue to monitor patient while medically admitted to assess for need of this medication.  Patient did endorse getting upset with staff at her nursing facility due to slow response times.   She reported having to take herself to the bathroom or having to wait long. She also stated that some staff will wipe front to back and others will wipe back to front.  When asked about getting physically aggressive with staff, patient denied any memory of these events.  Patient often required redirection in conversation.  She would often go on a tangent about her surviving family member, Juliene when asked assessment questions. Patient is also very hard of hearing which further impaired assessment.  Due to patient's baseline cognitive status, as stated above patient is not a candidate for inpatient psychiatric treatment due to inability to effectively participate in treatment programming.  It does appear at this time, that patient's symptoms are likely related to her previously diagnosed dementia.  Psychiatry will continue to assist medical team with management of behaviors.  Psych ROS:  Depression: Denied Anxiety:  Denied Mania (lifetime and current): Denied Psychosis: (lifetime and current): Denied      Psychiatric and Social History  Psychiatric History:  Information collected from Chart review due to patient being poor historian  Prev Dx/Sx: Dementia with behavioral disturbance Current Psych Provider: Denied Home Meds (current): Denied any mental health medications Previous Med Trials: Previously recommended seroquel  use PRN Therapy: None  Prior Psych Hospitalization: Denied  Prior Self Harm: Denied Prior Violence: Denied  Family Psych History: Unknown Family Hx suicide: Unknown   Social History:   Educational Hx: UTA Occupational Hx: UTA Legal Hx: UTA Living Situation: At nursing home Spiritual Hx: UTA Access to weapons/lethal means: Denied   Substance History Alcohol: UTA  Tobacco: UTA Illicit drugs: UTA Prescription drug abuse: UTA Rehab hx: UTA  Exam Findings  Physical Exam: Deferred to medical MD- note reviewed Vital Signs:  Temp:  [97.6 F (36.4 C)-98.2 F  (36.8 C)] 97.6 F (36.4 C) (10/30 1228) Pulse Rate:  [37-95] 78 (10/30 1235) Resp:  [15-20] 15 (10/30 1228) BP: (100-150)/(41-101) 129/53 (10/30 1228) SpO2:  [99 %-100 %] 100 % (10/30 1228) Blood pressure (!) 129/53, pulse 78, temperature 97.6 F (36.4 C), resp. rate 15, height 5' 5 (1.651 m), weight 74 kg, SpO2 100%. Body mass index is 27.15 kg/m.    Mental Status Exam: General Appearance: Casual  Orientation:  Other:  see above  Memory:  Immediate;   impaired Recent;   impaired Remote;   impaired  Concentration:  Concentration: Poor and Attention Span: Poor  Recall:  Poor  Attention  Poor  Eye Contact:  Good  Speech:  Clear and Coherent  Language:  Fair  Volume:  Normal  Mood: labile  Affect:  Congruent  Thought Process:  Irrelevant  Thought Content:  WDL  Suicidal Thoughts:  No  Homicidal Thoughts:  No  Judgement:  Impaired  Insight:  Lacking  Psychomotor Activity:  Normal  Akathisia:  No  Fund of Knowledge:  Fair      Assets:  Housing Social Support  Cognition:  Impaired,  Moderate  ADL's:  Impaired  AIMS (if indicated):        Other History   These have been pulled in through the EMR, reviewed, and updated if appropriate.  Family History:  The patient's family history includes Asthma in her mother; Kidney disease in her father.  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   CHF (congestive heart failure) (HCC)    Diabetes mellitus without complication (HCC)    HOH (hard of hearing)    extremely HOH   Hypertension    Morbid obesity (HCC) 02/24/2020   Palpitations    occasional related to meds/heart races    Surgical History: Past Surgical History:  Procedure Laterality Date   ABDOMINAL HYSTERECTOMY  1990   CATARACT EXTRACTION W/PHACO Right 12/20/2020   Procedure: CATARACT EXTRACTION PHACO AND INTRAOCULAR LENS PLACEMENT (IOC) RIGHT DIABETIC 8.13 01:17.6 10.5%;  Surgeon: Mittie Gaskin,  MD;  Location: [facility];  Service: Ophthalmology;  Laterality: Right;   CATARACT EXTRACTION W/PHACO Left 01/03/2021   Procedure: CATARACT EXTRACTION PHACO AND INTRAOCULAR LENS PLACEMENT (IOC) LEFT DIABETIC  7.37 01:15.6 9.8%;  Surgeon: Mittie Gaskin, MD;  Location: [facility];  Service: Ophthalmology;  Laterality: Left;   CHOLECYSTECTOMY     DILATION AND CURETTAGE OF UTERUS  before 1990   X2   NECK SURGERY  03/30/2008   Per family, pt broke her neck   STOMACH SURGERY     tumor removed, per family     Medications:   Current Facility-Administered Medications:    acetaminophen  (TYLENOL ) tablet 650 mg, 650 mg, Oral, Q6H PRN **OR** acetaminophen  (TYLENOL ) suppository 650 mg, 650 mg, Rectal, Q6H PRN, Cleatus Delayne GAILS, MD   albuterol  (PROVENTIL ) (2.5 MG/3ML) 0.083% nebulizer solution 2.5 mg, 2.5 mg, Nebulization, Q2H PRN, Cleatus Delayne GAILS, MD   apixaban  (ELIQUIS ) tablet 5 mg, 5 mg, Oral, BID, Cleatus Delayne GAILS, MD   cefTRIAXone  (ROCEPHIN ) 1 g in sodium chloride  0.9 % 100 mL IVPB, 1 g, Intravenous, Q24H, Trudy Anthony HERO, MD, Last Rate: 200 mL/hr at 07/22/24 1036, 1 g at 07/22/24 1036   docusate sodium  (COLACE) capsule 100 mg, 100 mg, Oral, BID, Cleatus Delayne GAILS, MD   haloperidol lactate (HALDOL) injection 5 mg, 5 mg, Intramuscular, Q6H PRN, Trudy Anthony HERO, MD, 5 mg at 07/22/24 9184   insulin  aspart (novoLOG ) injection 0-5 Units, 0-5 Units, Subcutaneous, QHS, Cleatus Delayne GAILS, MD   insulin  aspart (novoLOG ) injection 0-9 Units, 0-9 Units, Subcutaneous, TID WC, Cleatus Delayne GAILS, MD, 1 Units at 07/22/24 9153   insulin  glargine-yfgn (SEMGLEE ) injection 8 Units, 8 Units, Subcutaneous, Daily, Cleatus Delayne GAILS, MD, 8 Units at 07/22/24 1123   levothyroxine  (SYNTHROID ) tablet 75 mcg, 75 mcg, Oral, Q0600, Dail Rankin RAMAN, RPH   LORazepam  (ATIVAN ) tablet 0.5 mg, 0.5 mg, Oral, BID PRN, Cleatus Delayne GAILS, MD   losartan  (COZAAR ) tablet 50 mg, 50 mg, Oral, Daily, Cleatus Delayne GAILS, MD    magnesium  sulfate IVPB 2 g 50 mL, 2 g, Intravenous, Once, Cyrena Mylar, MD   metoprolol  tartrate (LOPRESSOR ) tablet 25 mg, 25 mg, Oral, BID, Cleatus Delayne GAILS, MD   pravastatin  (PRAVACHOL ) tablet 10 mg, 10 mg, Oral, q1800, Cleatus Delayne GAILS, MD   prochlorperazine (COMPAZINE) injection 5 mg, 5 mg, Intravenous, Q4H PRN, Cleatus Delayne GAILS, MD  Allergies: Allergies  Allergen Reactions   Penicillins Rash    Lafonda Patron.MD

## 2024-07-22 NOTE — Progress Notes (Signed)
Patient continues to refuse tele monitoring.

## 2024-07-22 NOTE — Plan of Care (Signed)
  Problem: Fluid Volume: Goal: Ability to maintain a balanced intake and output will improve Outcome: Progressing   Problem: Metabolic: Goal: Ability to maintain appropriate glucose levels will improve Outcome: Progressing   Problem: Nutritional: Goal: Maintenance of adequate nutrition will improve Outcome: Progressing   Problem: Tissue Perfusion: Goal: Adequacy of tissue perfusion will improve Outcome: Progressing   Problem: Clinical Measurements: Goal: Ability to maintain clinical measurements within normal limits will improve Outcome: Progressing Goal: Will remain free from infection Outcome: Progressing Goal: Diagnostic test results will improve Outcome: Progressing Goal: Respiratory complications will improve Outcome: Progressing Goal: Cardiovascular complication will be avoided Outcome: Progressing   Problem: Activity: Goal: Risk for activity intolerance will decrease Outcome: Progressing   Problem: Nutrition: Goal: Adequate nutrition will be maintained Outcome: Progressing

## 2024-07-22 NOTE — TOC Progression Note (Signed)
 Transition of Care [facility]) - Progression Note    Patient Details  Name: [name] MRN: [id number] Date of Birth: Dec 18, 1940  [facility]) CM/SW Contact  Tanya JAYSON Carpen, LCSW Phone Number: 07/22/2024, 2:32 PM  Clinical Narrative:  Per MD, daughter-in-law is interested in memory care placement. CSW called Mrs. Paulick and confirmed. She is agreeable to a [employer] referral to assist with this. She had spoken to them in August. CSW spoke to [place]. She will call her within the next 1-2 hours.   Expected Discharge Plan: Skilled Nursing Facility Barriers to Discharge: Continued Medical Work up               Expected Discharge Plan and Services     Post Acute Care Choice: Resumption of Svcs/PTA Provider Living arrangements for the past 2 months: Skilled Nursing Facility                                       Social Drivers of Health (SDOH) Interventions SDOH Screenings   Food Insecurity: No Food Insecurity (06/27/2024)  Housing: Unknown (06/27/2024)  Transportation Needs: Patient Unable To Answer (07/21/2024)  Utilities: Patient Unable To Answer (07/21/2024)  Alcohol Screen: Low Risk  (09/05/2021)  Depression (PHQ2-9): Low Risk  (06/14/2024)  Financial Resource Strain: Low Risk  (09/05/2021)  Physical Activity: Inactive (09/05/2021)  Social Connections: Patient Unable To Answer (07/21/2024)  Stress: No Stress Concern Present (09/05/2021)  Tobacco Use: Low Risk  (07/20/2024)    Readmission Risk Interventions     No data to display

## 2024-07-23 DIAGNOSIS — G9341 Metabolic encephalopathy: Secondary | ICD-10-CM | POA: Diagnosis not present

## 2024-07-23 LAB — BASIC METABOLIC PANEL WITH GFR
Anion gap: 7 (ref 5–15)
BUN: 9 mg/dL (ref 8–23)
CO2: 24 mmol/L (ref 22–32)
Calcium: 8.4 mg/dL — ABNORMAL LOW (ref 8.9–10.3)
Chloride: 109 mmol/L (ref 98–111)
Creatinine, Ser: 1.17 mg/dL — ABNORMAL HIGH (ref 0.44–1.00)
GFR, Estimated: 47 mL/min — ABNORMAL LOW (ref >60)
Glucose, Bld: 99 mg/dL (ref 70–99)
Potassium: 3.6 mmol/L (ref 3.5–5.1)
Sodium: 140 mmol/L (ref 135–145)

## 2024-07-23 LAB — CBC
HCT: 35.3 % — ABNORMAL LOW (ref 36.0–46.0)
Hemoglobin: 11.4 g/dL — ABNORMAL LOW (ref 12.0–15.0)
MCH: 29.5 pg (ref 26.0–34.0)
MCHC: 32.3 g/dL (ref 30.0–36.0)
MCV: 91.5 fL (ref 80.0–100.0)
Platelets: 175 K/uL (ref 150–400)
RBC: 3.86 MIL/uL — ABNORMAL LOW (ref 3.87–5.11)
RDW: 15.1 % (ref 11.5–15.5)
WBC: 5.6 K/uL (ref 4.0–10.5)
nRBC: 0 % (ref 0.0–0.2)

## 2024-07-23 LAB — GLUCOSE, CAPILLARY
Glucose-Capillary: 103 mg/dL — ABNORMAL HIGH (ref 70–99)
Glucose-Capillary: 156 mg/dL — ABNORMAL HIGH (ref 70–99)
Glucose-Capillary: 112 mg/dL — ABNORMAL HIGH (ref 70–99)
Glucose-Capillary: 110 mg/dL — ABNORMAL HIGH (ref 70–99)

## 2024-07-23 LAB — PHOSPHORUS: Phosphorus: 3.4 mg/dL (ref 2.5–4.6)

## 2024-07-23 LAB — MAGNESIUM: Magnesium: 1.5 mg/dL — ABNORMAL LOW (ref 1.7–2.4)

## 2024-07-23 MED ORDER — MAGNESIUM SULFATE 2 GM/50ML IV SOLN
2.0000 g | Freq: Once | INTRAVENOUS | Status: AC
  Administered 2024-07-23: 2 g via INTRAVENOUS
  Filled 2024-07-23: qty 50

## 2024-07-23 MED ORDER — SODIUM CHLORIDE 0.9% FLUSH
10.0000 mL | Freq: Two times a day (BID) | INTRAVENOUS | Status: DC
  Administered 2024-07-23 – 2024-07-26 (×4): 10 mL

## 2024-07-23 MED ORDER — SODIUM CHLORIDE 0.9% FLUSH: 10.0000 mL | INTRAVENOUS | Status: DC | PRN

## 2024-07-23 NOTE — TOC Progression Note (Addendum)
 Transition of Care [facility]) - Progression Note    Patient Details  Name: Tanya Frye MRN: [id number] Date of Birth: May 28, 1941  [facility] Care [facility]) CM/SW Contact  Racheal LITTIE Schimke, RN Phone Number: 07/23/2024, 4:14 PM  Clinical Narrative: Attempted to call [facility] regarding memory facility placement, no answer left message to return call.    Expected Discharge Plan: Skilled Nursing Facility Barriers to Discharge: Continued Medical Work up               Expected Discharge Plan and Services     Post Acute Care Choice: Resumption of Svcs/PTA Provider Living arrangements for the past 2 months: Skilled Nursing Facility                                       Social Drivers of Health (SDOH) Interventions SDOH Screenings   Food Insecurity: No Food Insecurity (06/27/2024)  Housing: Unknown (06/27/2024)  Transportation Needs: Patient Unable To Answer (07/21/2024)  Utilities: Patient Unable To Answer (07/21/2024)  Alcohol Screen: Low Risk  (09/05/2021)  Depression (PHQ2-9): Low Risk  (06/14/2024)  Financial Resource Strain: Low Risk  (09/05/2021)  Physical Activity: Inactive (09/05/2021)  Social Connections: Patient Unable To Answer (07/21/2024)  Stress: No Stress Concern Present (09/05/2021)  Tobacco Use: Low Risk  (07/20/2024)    Readmission Risk Interventions     No data to display

## 2024-07-23 NOTE — Progress Notes (Signed)
 Occupational Therapy Treatment Patient Details Name: Tanya Frye MRN: [id number] DOB: [birth year] Today's Date: 07/23/2024   History of present illness Tanya Frye is a 83 y.o. female with medical history significant for COPD, chronic HFrEF, HTN, IDDM, PAF on Eliquis , CKD stage IIIa, dementia, being admitted with left lower extremity cellulitis and electrolyte derangement presenting with altered mental status and increased agitation.  She was sent in by her facility [facility].   OT comments  Pt seen for OT treatment session. Pt HOH, lacks insight and has poor safety awareness + judgement. Pt is self-directed with low frustration tolerance. With cues for task initiation, pt is able to perform LB dressing partially in long sitting and partially seated EOB to thread other leg through underwear. Requires unilateal hand support on bed for STS to don underwear over hips, shoes donned while seated with supervision. Attempted UB dressing unsuccessfully with pt refusing to don gown. Unable to progress mobility due to pt's refusal. OT will continue to follow and progress as able.       If plan is discharge home, recommend the following:  A little help with walking and/or transfers;A little help with bathing/dressing/bathroom;Assistance with cooking/housework;Direct supervision/assist for medications management;Direct supervision/assist for financial management;Assist for transportation;Supervision due to cognitive status;Help with stairs or ramp for entrance   Equipment Recommendations  Other (comment)    Recommendations for Other Services      Precautions / Restrictions Precautions Precautions: Fall Recall of Precautions/Restrictions: Impaired Restrictions Weight Bearing Restrictions Per Provider Order: No       Mobility Bed Mobility Overal bed mobility: Needs Assistance Bed Mobility: Sit to Supine       Sit to supine: Contact guard assist        Transfers Overall transfer  level: Needs assistance   Transfers: Sit to/from Stand Sit to Stand: Supervision                 Balance Overall balance assessment: Needs assistance Sitting-balance support: Feet supported, No upper extremity supported Sitting balance-Leahy Scale: Good     Standing balance support: During functional activity Standing balance-Leahy Scale: Fair Standing balance comment: pt self-directed standing attempt from seated EOB, does require unilateral hand support for stability upon standing                           ADL either performed or assessed with clinical judgement   ADL Overall ADL's : Needs assistance/impaired Eating/Feeding: Independent               Upper Body Dressing : Sitting Upper Body Dressing Details (indicate cue type and reason): attempted multiple times to don gown (pt wearing gown around back with front open), pt throwing gown away and refusing to don Lower Body Dressing: Sit to/from stand;Supervision/safety Lower Body Dressing Details (indicate cue type and reason): dons shoes seated, dons underwear half long sitting, and transitions to sitting EOB to pull underwear over hips from STS             Functional mobility during ADLs: Supervision/safety General ADL Comments: anticipate continued need for cues for redirection/safety with level of cog impairments     Communication Communication Communication: Impaired Factors Affecting Communication: Hearing impaired   Cognition Arousal: Alert Behavior During Therapy: Restless, Agitated Cognition: Cognition impaired, History of cognitive impairments   Orientation impairments: Place, Situation, Time Awareness: Intellectual awareness impaired Memory impairment (select all impairments): Short-term memory, Working civil service fast streamer, Copywriter, advertising, Geneticist, Molecular long-term memory  Attention impairment (select first level of impairment): Focused attention, Sustained attention, Selective  attention, Alternating attention, Divided attention Executive functioning impairment (select all impairments): Initiation, Organization, Sequencing, Reasoning, Problem solving OT - Cognition Comments: pt with low frusteration tolerance, does not allow OT to initiate tasks, pt is self-directed throughout session and argumentitive                 Following commands: Impaired Following commands impaired: Follows one step commands inconsistently      Cueing   Cueing Techniques: Verbal cues, Tactile cues, Gestural cues  Exercises      Shoulder Instructions       General Comments Pt with dementia, difficult to redirect and maximize participation    Pertinent Vitals/ Pain       Pain Assessment Pain Assessment: No/denies pain   Frequency  Min 1X/week        Progress Toward Goals  OT Goals(current goals can now be found in the care plan section)  Progress towards OT goals: Progressing toward goals  Acute Rehab OT Goals OT Goal Formulation: Patient unable to participate in goal setting Time For Goal Achievement: 08/04/24 Potential to Achieve Goals: Fair ADL Goals Pt Will Perform Grooming: with set-up;with supervision;sitting;standing Pt Will Perform Lower Body Dressing: with contact guard assist;with min assist;sit to/from stand Pt Will Transfer to Toilet: with contact guard assist;with supervision;ambulating;bedside commode;grab bars Pt Will Perform Toileting - Clothing Manipulation and hygiene: with supervision;sit to/from stand;sitting/lateral leans Additional ADL Goal #1: Pt will complete bathing, primarily from seated position, with PRN MIN A and consistent supv for safety, 1/1 opportunity.  Plan         AM-PAC OT 6 Clicks Daily Activity     Outcome Measure   Help from another person eating meals?: None Help from another person taking care of personal grooming?: A Little Help from another person toileting, which includes using toliet, bedpan, or urinal?: A  Little Help from another person bathing (including washing, rinsing, drying)?: A Little Help from another person to put on and taking off regular upper body clothing?: A Little Help from another person to put on and taking off regular lower body clothing?: A Little 6 Click Score: 19    End of Session    OT Visit Diagnosis: Other abnormalities of gait and mobility (R26.89);Muscle weakness (generalized) (M62.81);Other symptoms and signs involving cognitive function   Activity Tolerance Other (comment) (limited by cognition)   Patient Left in bed;with nursing/sitter in room   Nurse Communication Mobility status        Time: 9072-9057 OT Time Calculation (min): 15 min  Charges: OT General Charges $OT Visit: 1 Visit OT Treatments $Self Care/Home Management : 8-22 mins [place] Plake L. Mehkai Gallo, OTR/L  07/23/24, 11:11 AM

## 2024-07-23 NOTE — Progress Notes (Signed)
 PROGRESS NOTE   HPI was taken from Dr. Cleatus: Tanya Frye is a 83 y.o. female with medical history significant for COPD, chronic HFrEF, HTN, IDDM, PAF on Eliquis , CKD stage IIIa, dementia, being admitted with left lower extremity cellulitis and electrolyte derangement presenting with altered mental status and increased agitation.  She was sent in by her facility [facility]. On arrival she was hypothermic to 95.8 mildly tachycardic to 120 with otherwise normal vitals. Hypokalemia of 2.9 and hypomagnesemia of 1.5 TSH normal with slightly elevated T4 1.92 Creatinine at 1.31, slightly above baseline of 1.08 with mild metabolic acidosis of 21 and increased anion gap of 19 CK normal WBC normal, lactic acid normal, UA not consistent with infection and chest x-ray not done EKG showed a junctional rhythm at 67 and prolonged QT of 656 Patient treated with an NS bolus, oral potassium Rocephin  started Placed on Bair hugger   Admission requested    Tanya Frye  FMW:[id number] DOB: 1940-10-14 DOA: 07/20/2024 PCP: Zenaida Morene PARAS, MD   Assessment & Plan:   Principal Problem:   Acute metabolic encephalopathy Active Problems:   Cellulitis of left lower extremity   Dementia with behavioral disturbance (HCC)   Hypokalemia   Hypomagnesemia   Prolonged QT interval   Chronic HFrEF (heart failure with reduced ejection fraction) (HCC)   Hypertension   Stage 3a chronic kidney disease (HCC)   Paroxysmal atrial fibrillation (HCC)   Diabetes mellitus without complication (HCC)   Hypothyroidism   COPD (chronic obstructive pulmonary disease) (HCC)  Assessment and Plan: Acute metabolic encephalopathy: likely secondary to infection, LLE cellulitis. Hx of dementia. Re-orient prn. Continue on IV abxs  Dementia: w/ behavioral disturbance. Still refusing to take most meds. Does not meet inpatient psych criteria as per psych. Psych signed off 07/21/24. Haldol prn    Cellulitis of left lower  extremity: w/ superficial skin ulcer. Continue on IV rocephin  and wound care   Hypokalemia: WNL today   Hypomagnesemia: mg sulfate ordered    Prolonged QT interval: continue on tele.    Chronic systolic CHF: appears euvolemic. Continue on home dose of losartan , metoprolol . Holding home torsemide . Monitor I/Os   HTN: continue on metoprolol , losartan     CKDIIIa: Cr is labile. Avoid nephrotoxic meds   PAF: continue on metoprolol , eliquis    DM2: fair control, HbA1c 7.1. Continue on SSI w/ accuchecks    Hypothyroidism: continue on home dose of levothyroxine     COPD: w/o exacerbation. Bronchodilators prn       DVT prophylaxis: eliquis  Code Status: DNR Family Communication: discussed pt's care w/ pt's daughter in law, Jodie, and answered her questions  Disposition Plan: likely d/c back to home facility   Level of care: Progressive  Status is: Inpatient Remains inpatient appropriate because: severity of illness    Consultants:  Psych   Procedures:   Antimicrobials: rocephin    Subjective: Pt is confused  Objective: Vitals:   07/22/24 0719 07/22/24 1228 07/22/24 1235 07/22/24 1938  BP: (!) 137/101 (!) 129/53  133/72  Pulse: (!) 51 (!) 37 78 99  Resp: 17 15  16   Temp: 98.1 F (36.7 C) 97.6 F (36.4 C)  97.6 F (36.4 C)  TempSrc:      SpO2: 99% 100%  100%  Weight:      Height:        Intake/Output Summary (Last 24 hours) at 07/23/2024 0900 Last data filed at 07/22/2024 1008 Gross per 24 hour  Intake 120 ml  Output --  Net 120 ml   Filed Weights   07/20/24 1747  Weight: 74 kg    Examination:  General exam: appears confused Respiratory system: clear breath sounds b/l  Cardiovascular system: S1&S2+. No rubs or clicks Gastrointestinal system: abd is soft, NT, ND & hypoactive bowel sounds Central nervous system: alert & awake. Moves all extremities  Psychiatry: judgement and insight appears poor     Data Reviewed: I have personally reviewed  following labs and imaging studies  CBC: Recent Labs  Lab 07/20/24 1755 07/21/24 0423 07/22/24 0419 07/23/24 0344  WBC 9.8 6.8 7.9 5.6  HGB 14.3 10.8* 12.0 11.4*  HCT 42.5 32.8* 38.0 35.3*  MCV 87.6 90.6 92.0 91.5  PLT 321 187 218 175   Basic Metabolic Panel: Recent Labs  Lab 07/20/24 1755 07/21/24 0423 07/22/24 0419 07/23/24 0344  NA 135 140 138 140  K 2.9* 2.7* 3.9 3.6  CL 95* 101 105 109  CO2 21* 23 25 24   GLUCOSE 179* 88 105* 99  BUN 13 11 10 9   CREATININE 1.31* 1.09* 1.08* 1.17*  CALCIUM 9.2 7.9* 8.5* 8.4*  MG 1.5*  --  1.7 1.5*  PHOS  --   --  2.7 3.4   GFR: Estimated Creatinine Clearance: 37.3 mL/min (A) (by C-G formula based on SCr of 1.17 mg/dL (H)). Liver Function Tests: Recent Labs  Lab 07/20/24 1755  AST 43*  ALT 33  ALKPHOS 39  BILITOT 1.1  PROT 7.3  ALBUMIN 3.9   No results for input(s): LIPASE, AMYLASE in the last 168 hours. No results for input(s): AMMONIA in the last 168 hours. Coagulation Profile: No results for input(s): INR, PROTIME in the last 168 hours. Cardiac Enzymes: Recent Labs  Lab 07/20/24 1939  CKTOTAL 61   BNP (last 3 results) No results for input(s): PROBNP in the last 8760 hours. HbA1C: No results for input(s): HGBA1C in the last 72 hours. CBG: Recent Labs  Lab 07/21/24 2235 07/22/24 0829 07/22/24 1229 07/22/24 1746 07/22/24 2108  GLUCAP 120* 122* 82 109* 105*   Lipid Profile: No results for input(s): CHOL, HDL, LDLCALC, TRIG, CHOLHDL, LDLDIRECT in the last 72 hours. Thyroid  Function Tests: Recent Labs    07/20/24 1755  TSH 4.069  FREET4 1.92*   Anemia Panel: No results for input(s): VITAMINB12, FOLATE, FERRITIN, TIBC, IRON, RETICCTPCT in the last 72 hours. Sepsis Labs: Recent Labs  Lab 07/21/24 0215 07/21/24 0342  LATICACIDVEN 1.6 1.2    Recent Results (from the past 240 hours)  Culture, blood (routine x 2)     Status: None (Preliminary result)   Collection  Time: 07/21/24  2:15 AM   Specimen: BLOOD  Result Value Ref Range Status   Specimen Description BLOOD RIGHT ARM  Final   Special Requests   Final    BOTTLES DRAWN AEROBIC AND ANAEROBIC Blood Culture results may not be optimal due to an inadequate volume of blood received in culture bottles   Culture   Final    NO GROWTH 2 DAYS Performed at [facility], [address], [place], [place] [address]    Report Status PENDING  Incomplete  Culture, blood (routine x 2)     Status: None (Preliminary result)   Collection Time: 07/21/24  2:16 AM   Specimen: BLOOD  Result Value Ref Range Status   Specimen Description BLOOD LEFT ARM  Final   Special Requests   Final    BOTTLES DRAWN AEROBIC AND ANAEROBIC Blood Culture adequate volume   Culture  Final    NO GROWTH 2 DAYS Performed at [facility], [address], [place], [place] [address]    Report Status PENDING  Incomplete         Radiology Studies: No results found.      Scheduled Meds:  apixaban   5 mg Oral BID   docusate sodium   100 mg Oral BID   insulin  aspart  0-5 Units Subcutaneous QHS   insulin  aspart  0-9 Units Subcutaneous TID WC   insulin  glargine-yfgn  8 Units Subcutaneous Daily   levothyroxine   75 mcg Oral Q0600   losartan   50 mg Oral Daily   metoprolol  tartrate  25 mg Oral BID   pravastatin   10 mg Oral q1800   Continuous Infusions:  cefTRIAXone  (ROCEPHIN )  IV 1 g (07/22/24 1036)   magnesium  sulfate bolus IVPB       LOS: 2 days      Anthony CHRISTELLA Pouch, MD [facility] Pager [phone]  If 7PM-7AM, please contact night-coverage [email or web address] 07/23/2024, 9:00 AM

## 2024-07-23 NOTE — Progress Notes (Signed)
 Patient is refusing PO medications and tele monitoring. She continues to state that she is ready to leave the hospital. She pulled out her remaining IV access. Patient is alert and oriented but has poor safety and judgement. IV team consult placed for new IV. MD aware of behavior. Patient does not appear to be in distress at this time.

## 2024-07-23 NOTE — Care Management Important Message (Signed)
 Important Message  Patient Details  Name: Tanya Frye MRN: [id number] Date of Birth: 19-Feb-1941   Important Message Given:  Yes - Medicare IM     Rojelio Tanya Frye 07/23/2024, 2:49 PM

## 2024-07-23 NOTE — Plan of Care (Signed)
  Problem: Education: Goal: Ability to describe self-care measures that may prevent or decrease complications (Diabetes Survival Skills Education) will improve Outcome: Progressing   Problem: Coping: Goal: Ability to adjust to condition or change in health will improve Outcome: Progressing   Problem: Fluid Volume: Goal: Ability to maintain a balanced intake and output will improve Outcome: Progressing   Problem: Health Behavior/Discharge Planning: Goal: Ability to identify and utilize available resources and services will improve Outcome: Progressing Goal: Ability to manage health-related needs will improve Outcome: Progressing   Problem: Metabolic: Goal: Ability to maintain appropriate glucose levels will improve Outcome: Progressing   Problem: Nutritional: Goal: Maintenance of adequate nutrition will improve Outcome: Progressing   Problem: Skin Integrity: Goal: Risk for impaired skin integrity will decrease Outcome: Progressing   Problem: Tissue Perfusion: Goal: Adequacy of tissue perfusion will improve Outcome: Progressing   Problem: Education: Goal: Knowledge of General Education information will improve Description: Including pain rating scale, medication(s)/side effects and non-pharmacologic comfort measures Outcome: Progressing   Problem: Health Behavior/Discharge Planning: Goal: Ability to manage health-related needs will improve Outcome: Progressing   Problem: Clinical Measurements: Goal: Ability to maintain clinical measurements within normal limits will improve Outcome: Progressing Goal: Will remain free from infection Outcome: Progressing Goal: Diagnostic test results will improve Outcome: Progressing Goal: Respiratory complications will improve Outcome: Progressing Goal: Cardiovascular complication will be avoided Outcome: Progressing   Problem: Activity: Goal: Risk for activity intolerance will decrease Outcome: Progressing   Problem:  Nutrition: Goal: Adequate nutrition will be maintained Outcome: Progressing   Problem: Coping: Goal: Level of anxiety will decrease Outcome: Progressing   Problem: Elimination: Goal: Will not experience complications related to bowel motility Outcome: Progressing Goal: Will not experience complications related to urinary retention Outcome: Progressing   Problem: Pain Managment: Goal: General experience of comfort will improve and/or be controlled Outcome: Progressing   Problem: Safety: Goal: Ability to remain free from injury will improve Outcome: Progressing   Problem: Skin Integrity: Goal: Risk for impaired skin integrity will decrease Outcome: Progressing

## 2024-07-24 DIAGNOSIS — I110 Hypertensive heart disease with heart failure: Secondary | ICD-10-CM

## 2024-07-24 DIAGNOSIS — G9341 Metabolic encephalopathy: Secondary | ICD-10-CM | POA: Diagnosis not present

## 2024-07-24 DIAGNOSIS — F03918 Unspecified dementia, unspecified severity, with other behavioral disturbance: Secondary | ICD-10-CM | POA: Diagnosis not present

## 2024-07-24 DIAGNOSIS — I5022 Chronic systolic (congestive) heart failure: Secondary | ICD-10-CM | POA: Diagnosis not present

## 2024-07-24 LAB — BASIC METABOLIC PANEL WITH GFR
Anion gap: 9 (ref 5–15)
BUN: 9 mg/dL (ref 8–23)
CO2: 23 mmol/L (ref 22–32)
Calcium: 8.3 mg/dL — ABNORMAL LOW (ref 8.9–10.3)
Chloride: 103 mmol/L (ref 98–111)
Creatinine, Ser: 1.02 mg/dL — ABNORMAL HIGH (ref 0.44–1.00)
GFR, Estimated: 55 mL/min — ABNORMAL LOW (ref >60)
Glucose, Bld: 105 mg/dL — ABNORMAL HIGH (ref 70–99)
Potassium: 3.7 mmol/L (ref 3.5–5.1)
Sodium: 135 mmol/L (ref 135–145)

## 2024-07-24 LAB — GLUCOSE, CAPILLARY
Glucose-Capillary: 104 mg/dL — ABNORMAL HIGH (ref 70–99)
Glucose-Capillary: 95 mg/dL (ref 70–99)
Glucose-Capillary: 99 mg/dL (ref 70–99)

## 2024-07-24 LAB — CBC
HCT: 39.5 % (ref 36.0–46.0)
Hemoglobin: 12.7 g/dL (ref 12.0–15.0)
MCH: 29.3 pg (ref 26.0–34.0)
MCHC: 32.2 g/dL (ref 30.0–36.0)
MCV: 91 fL (ref 80.0–100.0)
Platelets: 245 K/uL (ref 150–400)
RBC: 4.34 MIL/uL (ref 3.87–5.11)
RDW: 15.2 % (ref 11.5–15.5)
WBC: 8.1 K/uL (ref 4.0–10.5)
nRBC: 0 % (ref 0.0–0.2)

## 2024-07-24 LAB — MAGNESIUM: Magnesium: 2.1 mg/dL (ref 1.7–2.4)

## 2024-07-24 LAB — PHOSPHORUS: Phosphorus: 4.1 mg/dL (ref 2.5–4.6)

## 2024-07-24 NOTE — Plan of Care (Signed)
  Problem: Education: Goal: Ability to describe self-care measures that may prevent or decrease complications (Diabetes Survival Skills Education) will improve 07/24/2024 1555 by Emeline Clent HERO, RN Outcome: Progressing  Problem: Coping: Goal: Ability to adjust to condition or change in health will improve 07/24/2024 1555 by Emeline Clent HERO, RN Outcome: Progressing    Problem: Fluid Volume: Goal: Ability to maintain a balanced intake and output will improve 07/24/2024 1555 by Emeline Clent HERO, RN Outcome: Progressing    Problem: Health Behavior/Discharge Planning: Goal: Ability to identify and utilize available resources and services will improve 07/24/2024 1555 by Emeline Clent HERO, RN Outcome: Progressing  Goal: Ability to manage health-related needs will improve 07/24/2024 1555 by Emeline Clent HERO, RN Outcome: Progressing    Problem: Metabolic: Goal: Ability to maintain appropriate glucose levels will improve 07/24/2024 1555 by Emeline Clent HERO, RN Outcome: Progressing    Problem: Nutritional: Goal: Maintenance of adequate nutrition will improve 07/24/2024 1555 by Emeline Clent HERO, RN Outcome: Progressing 07/24/2024 1554 by Emeline Clent HERO, RN Outcome: Not Progressing   Problem: Skin Integrity: Goal: Risk for impaired skin integrity will decrease 07/24/2024 1555 by Emeline Clent HERO, RN Outcome: Progressing    Problem: Tissue Perfusion: Goal: Adequacy of tissue perfusion will improve 07/24/2024 1555 by Emeline Clent HERO, RN Outcome: Progressing    Problem: Health Behavior/Discharge Planning: Goal: Ability to manage health-related needs will improve 07/24/2024 1555 by Emeline Clent HERO, RN Outcome: Progressing  Problem: Clinical Measurements: Goal: Ability to maintain clinical measurements within normal limits will improve 07/24/2024 1555 by Emeline Clent HERO, RN Outcome: Progressing  Goal: Will remain free from  infection 07/24/2024 1555 by Emeline Clent HERO, RN Outcome: Progressing  Goal: Diagnostic test results will improve 07/24/2024 1555 by Emeline Clent HERO, RN Outcome: Progressing  Goal: Respiratory complications will improve 07/24/2024 1555 by Emeline Clent HERO, RN Outcome: Progressing  Goal: Cardiovascular complication will be avoided 07/24/2024 1555 by Emeline Clent HERO, RN Outcome: Progressing    Problem: Activity: Goal: Risk for activity intolerance will decrease 07/24/2024 1555 by Emeline Clent HERO, RN Outcome: Progressing    Problem: Nutrition: Goal: Adequate nutrition will be maintained 07/24/2024 1555 by Emeline Clent HERO, RN Outcome: Progressing  Problem: Coping: Goal: Level of anxiety will decrease 07/24/2024 1555 by Emeline Clent HERO, RN Outcome: Progressing    Problem: Elimination: Goal: Will not experience complications related to bowel motility 07/24/2024 1555 by Emeline Clent HERO, RN Outcome: Progressing  Goal: Will not experience complications related to urinary retention 07/24/2024 1555 by Emeline Clent HERO, RN Outcome: Progressing    Problem: Pain Managment: Goal: General experience of comfort will improve and/or be controlled 07/24/2024 1555 by Emeline Clent HERO, RN Outcome: Progressing    Problem: Safety: Goal: Ability to remain free from injury will improve 07/24/2024 1555 by Emeline Clent HERO, RN Outcome: Progressing   Problem: Skin Integrity: Goal: Risk for impaired skin integrity will decrease 07/24/2024 1555 by Emeline Clent HERO, RN Outcome: Progressing

## 2024-07-24 NOTE — Progress Notes (Signed)
 Patient refuses all AM medications, including Iv antibiotics. Patient becomes agitated when asked about why she doesn't want to take medications and when educated about importance of medication compliance. Patient very confused and states I just want to be left alone. Sitter at bedside at this time.

## 2024-07-24 NOTE — Progress Notes (Addendum)
 Progress Note    Tanya Frye  FMW:[id number] DOB: May 30, 1941  DOA: 07/20/2024 PCP: Zenaida Morene PARAS, MD      Brief Narrative:    Medical records reviewed and are as summarized below:  Tanya Frye is a 83 y.o. female with medical history significant for recently diagnosed UTI, COPD, chronic HFrEF, HTN, IDDM, PAF on Eliquis , CKD stage IIIa, dementia, was referred from [facility] to the ED because of increasing altered mental status, with aggressive behavior and combativeness and refusal to take her medications.  In the ED, she was hypothermic and tachycardia with heart rate in the 120s. Labs significant for potassium 2.9, magnesium  1.5.  EKG significant for QTc of 656.  She was placed on a Bair hugger in the ED.  She was given some potassium chloride , IV fluids and IV ceftriaxone .    Assessment/Plan:   Principal Problem:   Acute metabolic encephalopathy Active Problems:   Cellulitis of left lower extremity   Dementia with behavioral disturbance (HCC)   Hypokalemia   Hypomagnesemia   Prolonged QT interval   Chronic HFrEF (heart failure with reduced ejection fraction) (HCC)   Hypertension   Stage 3a chronic kidney disease (HCC)   Paroxysmal atrial fibrillation (HCC)   Diabetes mellitus without complication (HCC)   Hypothyroidism   COPD (chronic obstructive pulmonary disease) (HCC)     Body mass index is 27.15 kg/m.   Acute metabolic encephalopathy on underlying dementia with behavioral disturbance: Continue supportive care.  Reorientation as able.   Left lower extremity cellulitis: Continue IV ceftriaxone .   Hypokalemia: Improved Hypomagnesemia: Improved   Prolonged QTc interval (656) on admission: [facility] EKG on 07/25/2023 showed improvement to 406.   Chronic diastolic CHF, history of systolic CHF with recovered EF: Compensated. 2D echo on 06/30/2024 showed EF estimated at 55 to 60% mild to moderate MR, moderate calcification of the  aortic valve, unable to exclude AAS.   CKD stage IIIa: Creatinine is labile.   COPD: Continue bronchodilators.   Paroxysmal atrial fibrillation on metoprolol  and Eliquis    Comorbidities include hypertension, type II DM   Diet Order             Diet heart healthy/carb modified Room service appropriate? Yes; Fluid consistency: Thin  Diet effective now                                  Consultants: Psychiatrist  Procedures: None    Medications:    apixaban   5 mg Oral BID   docusate sodium   100 mg Oral BID   insulin  aspart  0-5 Units Subcutaneous QHS   insulin  aspart  0-9 Units Subcutaneous TID WC   insulin  glargine-yfgn  8 Units Subcutaneous Daily   levothyroxine   75 mcg Oral Q0600   losartan   50 mg Oral Daily   metoprolol  tartrate  25 mg Oral BID   pravastatin   10 mg Oral q1800   sodium chloride  flush  10-40 mL Intracatheter Q12H   Continuous Infusions:  cefTRIAXone  (ROCEPHIN )  IV 1 g (07/23/24 1221)     Anti-infectives (From admission, onward)    Start     Dose/Rate Route Frequency Ordered Stop   07/22/24 1000  cefTRIAXone  (ROCEPHIN ) 1 g in sodium chloride  0.9 % 100 mL IVPB        1 g 200 mL/hr over 30 Minutes Intravenous Every 24 hours 07/21/24 1412 07/26/24 0959  07/21/24 1500  cefTRIAXone  (ROCEPHIN ) 1 g in sodium chloride  0.9 % 100 mL IVPB  Status:  Discontinued        1 g 200 mL/hr over 30 Minutes Intravenous Every 24 hours 07/21/24 1412 07/21/24 1412   07/21/24 0200  cefTRIAXone  (ROCEPHIN ) 1 g in sodium chloride  0.9 % 100 mL IVPB        1 g 200 mL/hr over 30 Minutes Intravenous  Once 07/21/24 0148 07/21/24 0235   07/21/24 0200  vancomycin (VANCOREADY) IVPB 1750 mg/350 mL        1,750 mg 175 mL/hr over 120 Minutes Intravenous  Once 07/21/24 0158 07/21/24 0521              Family Communication/Anticipated D/C date and plan/Code Status   DVT prophylaxis:  apixaban  (ELIQUIS ) tablet 5 mg     Code Status: Limited: Do not  attempt resuscitation (DNR) -DNR-LIMITED -Do Not Intubate/DNI   Family Communication: None Disposition Plan: Plan to discharge to SNF   Status is: Inpatient Remains inpatient appropriate because: Left leg cellulitis       Subjective:   Interval events noted.  She is confused and unable to provide any history.  She has been refusing medications and nursing care.  One-to-one sitter at the bedside.  Objective:    Vitals:   07/23/24 2010 07/24/24 0126 07/24/24 0507 07/24/24 0823  BP: 135/72 117/70 (!) 121/56 126/65  Pulse: 90 71 74 72  Resp: 16 16 16 18   Temp: 97.7 F (36.5 C) 97.7 F (36.5 C) (!) 97.5 F (36.4 C) (!) 97.4 F (36.3 C)  TempSrc: Oral     SpO2: 100% 100% 100% 100%  Weight:      Height:       No data found.   Intake/Output Summary (Last 24 hours) at 07/24/2024 1515 Last data filed at 07/24/2024 1419 Gross per 24 hour  Intake 120 ml  Output --  Net 120 ml   Filed Weights   07/20/24 1747  Weight: 74 kg    Exam:  GEN: NAD SKIN: Warm and dry EYES: No pallor or icterus ENT: MMM CV: RRR PULM: CTA B ABD: soft, ND, NT, +BS CNS: Alert, confused EXT: Bilateral leg edema.  More erythema noted on the left leg compared to the right.        Data Reviewed:   I have personally reviewed following labs and imaging studies:  Labs: Labs show the following:   Basic Metabolic Panel: Recent Labs  Lab 07/20/24 1755 07/21/24 0423 07/22/24 0419 07/23/24 0344 07/24/24 0520  NA 135 140 138 140 135  K 2.9* 2.7* 3.9 3.6 3.7  CL 95* 101 105 109 103  CO2 21* 23 25 24 23   GLUCOSE 179* 88 105* 99 105*  BUN 13 11 10 9 9   CREATININE 1.31* 1.09* 1.08* 1.17* 1.02*  CALCIUM 9.2 7.9* 8.5* 8.4* 8.3*  MG 1.5*  --  1.7 1.5* 2.1  PHOS  --   --  2.7 3.4 4.1   GFR Estimated Creatinine Clearance: 42.8 mL/min (A) (by C-G formula based on SCr of 1.02 mg/dL (H)). Liver Function Tests: Recent Labs  Lab 07/20/24 1755  AST 43*  ALT 33  ALKPHOS 39  BILITOT 1.1   PROT 7.3  ALBUMIN 3.9   No results for input(s): LIPASE, AMYLASE in the last 168 hours. No results for input(s): AMMONIA in the last 168 hours. Coagulation profile No results for input(s): INR, PROTIME in the last 168 hours.  CBC: Recent  Labs  Lab 07/20/24 1755 07/21/24 0423 07/22/24 0419 07/23/24 0344 07/24/24 0520  WBC 9.8 6.8 7.9 5.6 8.1  HGB 14.3 10.8* 12.0 11.4* 12.7  HCT 42.5 32.8* 38.0 35.3* 39.5  MCV 87.6 90.6 92.0 91.5 91.0  PLT 321 187 218 175 245   Cardiac Enzymes: Recent Labs  Lab 07/20/24 1939  CKTOTAL 61   BNP (last 3 results) No results for input(s): PROBNP in the last 8760 hours. CBG: Recent Labs  Lab 07/23/24 0946 07/23/24 1209 07/23/24 1607 07/23/24 2035 07/24/24 0821  GLUCAP 103* 156* 112* 110* 104*   D-Dimer: No results for input(s): DDIMER in the last 72 hours. Hgb A1c: No results for input(s): HGBA1C in the last 72 hours. Lipid Profile: No results for input(s): CHOL, HDL, LDLCALC, TRIG, CHOLHDL, LDLDIRECT in the last 72 hours. Thyroid  function studies: No results for input(s): TSH, T4TOTAL, T3FREE, THYROIDAB in the last 72 hours.  Invalid input(s): FREET3 Anemia work up: No results for input(s): VITAMINB12, FOLATE, FERRITIN, TIBC, IRON, RETICCTPCT in the last 72 hours. Sepsis Labs: Recent Labs  Lab 07/21/24 0215 07/21/24 0342 07/21/24 0423 07/22/24 0419 07/23/24 0344 07/24/24 0520  WBC  --   --  6.8 7.9 5.6 8.1  LATICACIDVEN 1.6 1.2  --   --   --   --     Microbiology Recent Results (from the past 240 hours)  Culture, blood (routine x 2)     Status: None (Preliminary result)   Collection Time: 07/21/24  2:15 AM   Specimen: BLOOD  Result Value Ref Range Status   Specimen Description BLOOD RIGHT ARM  Final   Special Requests   Final    BOTTLES DRAWN AEROBIC AND ANAEROBIC Blood Culture results may not be optimal due to an inadequate volume of blood received in culture  bottles   Culture   Final    NO GROWTH 3 DAYS Performed at [facility], [address], [place], [place] [address]    Report Status PENDING  Incomplete  Culture, blood (routine x 2)     Status: None (Preliminary result)   Collection Time: 07/21/24  2:16 AM   Specimen: BLOOD  Result Value Ref Range Status   Specimen Description BLOOD LEFT ARM  Final   Special Requests   Final    BOTTLES DRAWN AEROBIC AND ANAEROBIC Blood Culture adequate volume   Culture   Final    NO GROWTH 3 DAYS Performed at [facility], [address] [place], [place] [address]    Report Status PENDING  Incomplete    Procedures and diagnostic studies:  No results found.             LOS: 3 days   Avaiyah Strubel  [facility]   Pager on [email or web address]. If 7PM-7AM, please contact night-coverage at [email or web address]     07/24/2024, 3:15 PM

## 2024-07-24 NOTE — Consult Note (Signed)
 [facility] Psychiatric Consult Initial  Patient Name: .Tanya Frye  MRN: [id number]  DOB: 10/15/1940  Consult Order details:  Orders (From admission, onward)     Start     Ordered   07/20/24 2123  IP CONSULT TO PSYCHIATRY       Ordering Provider: Dorothyann Drivers, MD  Provider:  (Not yet assigned)  Question Answer Comment  Consult Timeframe ROUTINE - requires response within 24 hours   Reason for Consult? Consult for medication management   Contact phone number where the requesting provider can be reached [phone]      07/20/24 2122             Mode of Visit: In person    Psychiatry Consult Evaluation  Service Date: July 24, 2024 LOS:  LOS: 3 days  Chief Complaint Increased confusion and aggression reported by nursing facility  Primary Psychiatric Diagnoses  Dementia with behavioral disturbance    Assessment  Tanya Frye is a 83 y.o. female admitted: Medically  Patient was consulted to psychiatry due to having diagnosed dementia with behavioral disturbances.  Per review of nursing notes, it appears patient has been refusing to take her home medications or her medications that were prescribed for her UTI.  Per review of notes, it appears that this instance is the first time that patient has become physical with the staff at her residence as well as being aggressive on initial presentation to the emergency department requiring IM agitation meds.    07/24/2024: Patient continues to be confused and not able to engage in a meaningful interview. RN at beside and reports that she continues to refuse medications and is irritable during some areas of care. She is allowing vital signs and would not allow IV flush today. Recommendations of repeat LFTs was not drawn, will place order for AM labs and follow plan for depakote sprinkles if appropriate.  07/22/24: Patient remains confused.  Patient has hard of hearing and responded to the questions by writing.  After 2 questions she  started scribbling on the paper.  Per sitter patient remains irritable and constantly tries to get out of the bed.  Given the prolonged QTc we continue to recommend Ativan  as needed.  LFTs are slightly elevated with increased ALT.  Repeat LFTs will recommend to consider Depakote sprinkles to 50 mg twice daily. 07/21/24:On assessment today, patient was able to tell me her name and birthday as well as that she was at the hospital and the current year was 2025.  She was also able to accurately tell me the upcoming holidays being Halloween and Thanksgiving.  Patient's current QTc is noted to be 656 which limits psychiatry's ability significantly to intervene with medications at this time.  We will continue to monitor patient's behavior while hospitalized medically.  We do recommend to continue with the 0.5 mg Ativan  2 times daily as needed due to patient's significantly prolonged QTc.  If behaviors increase or worsen, we discussed with medical team about potentially adding Depakote sprinkles.  We will continue to monitor patient while medically admitted to assess for need of this medication.  Diagnoses:  Active Hospital problems: Principal Problem:   Acute metabolic encephalopathy Active Problems:   Hypertension   Chronic HFrEF (heart failure with reduced ejection fraction) (HCC)   COPD (chronic obstructive pulmonary disease) (HCC)   Hypothyroidism   Diabetes mellitus without complication (HCC)   Hypokalemia   Hypomagnesemia   Stage 3a chronic kidney disease (HCC)   Paroxysmal atrial fibrillation (  HCC)   Cellulitis of left lower extremity   Prolonged QT interval   Dementia with behavioral disturbance (HCC)    Plan   ## Psychiatric Medication Recommendations:  Continue ativan  0.5mg  2 times daily PRN due to patient qtc  ## Medical Decision Making Capacity: Not specifically addressed in this encounter  ## Further Work-up:   -- most recent EKG on 07/21/2024 had QtC of 656 -- Pertinent labwork  reviewed earlier this admission includes: cbc, cmp   ## Disposition:-- There are no psychiatric contraindications to discharge at this time  ## Behavioral / Environmental: -Delirium Precautions: Delirium Interventions for Nursing and Staff: - RN to open blinds every AM. - To Bedside: Glasses, hearing aide, and pt's own shoes. Make available to patients. when possible and encourage use. - Encourage po fluids when appropriate, keep fluids within reach. - OOB to chair with meals. - Passive ROM exercises to all extremities with AM & PM care. - RN to assess orientation to person, time and place QAM and PRN. - Recommend extended visitation hours with familiar family/friends as feasible. - Staff to minimize disturbances at night. Turn off television when pt asleep or when not in use.    ## Safety and Observation Level:  - Based on my clinical evaluation, I estimate the patient to be at low risk of self harm in the current setting. - At this time, we recommend  1:1 Observation. This decision is based on my review of the chart including patient's history and current presentation, interview of the patient, mental status examination, and consideration of suicide risk including evaluating suicidal ideation, plan, intent, suicidal or self-harm behaviors, risk factors, and protective factors. This judgment is based on our ability to directly address suicide risk, implement suicide prevention strategies, and develop a safety plan while the patient is in the clinical setting. Please contact our team if there is a concern that risk level has changed.  CSSR Risk Category:C-SSRS RISK CATEGORY: No Risk  Suicide Risk Assessment: Patient has following modifiable risk factors for suicide: N/A, which we are addressing by using therapeutic communication when talking with patient. Patient has following non-modifiable or demographic risk factors for suicide: N/A Patient has the following protective factors against suicide:  Supportive family, no history of suicide attempts, and no history of NSSIB  Thank you for this consult request. Recommendations have been communicated to the primary team.  We will sign off at this time.   Sree [email or web address]        History of Present Illness  Relevant Aspects of Hospital Hospital   Patient Report: 07/24/2024: Patient continues to be confused and not able to engage in a meaningful interview. RN at beside and reports that she continues to refuse medications and is irritable during some areas of care. She is allowing vital signs and would not allow IV flush today. Recommendations of repeat LFTs was not drawn, will place order for AM labs and follow plan for depakote sprinkles if appropriate.  07/22/24: Patient is noted to be resting in bed.  She talks about just needing her blanket and to sandals.  She is hard of hearing and most of the questions are written on the paper.  She was unable to give details about her hospitalization.  Patient is on thin liquids and per sitter patient is able to take the liquids.  Per sitter patient is constantly trying to get out of the bed and wander around.  Patient is irritable but not physically aggressive.  Patient is not  displaying any self-harming behaviors at this time. 07/21/24:Patient was consulted to psychiatry due to having diagnosed dementia with behavioral disturbances.  Per review of nursing notes, it appears patient has been refusing to take her home medications or her medications that were prescribed for her UTI.  Per review of notes, it appears that this instance is the first time that patient has become physical with the staff at her residence as well as being aggressive on initial presentation to the emergency department requiring IM agitation meds.  On assessment today, patient was able to tell me her name and birthday as well as that she was at the hospital and the current year was 2025.  She was also able to accurately tell me the  upcoming holidays being Halloween and Thanksgiving.  Patient's current QTc is noted to be 656 which limits psychiatry's ability significantly to intervene with medications at this time.  We will continue to monitor patient's behavior while hospitalized medically.  We do recommend to continue with the 0.5 mg Ativan  2 times daily as needed due to patient's significantly prolonged QTc.  If behaviors increase or worsen, we discussed with medical team about potentially adding Depakote sprinkles. We will have to discuss this with patient POA or emergency contact due to patient current mental status. We will continue to monitor patient while medically admitted to assess for need of this medication.  Patient did endorse getting upset with staff at her nursing facility due to slow response times.  She reported having to take herself to the bathroom or having to wait long. She also stated that some staff will wipe front to back and others will wipe back to front.  When asked about getting physically aggressive with staff, patient denied any memory of these events.  Patient often required redirection in conversation.  She would often go on a tangent about her surviving family member, Juliene when asked assessment questions. Patient is also very hard of hearing which further impaired assessment.  Due to patient's baseline cognitive status, as stated above patient is not a candidate for inpatient psychiatric treatment due to inability to effectively participate in treatment programming.  It does appear at this time, that patient's symptoms are likely related to her previously diagnosed dementia.  Psychiatry will continue to assist medical team with management of behaviors.  Psych ROS:  Depression: Denied Anxiety:  Denied Mania (lifetime and current): Denied Psychosis: (lifetime and current): Denied      Psychiatric and Social History  Psychiatric History:  Information collected from Chart review due to patient  being poor historian  Prev Dx/Sx: Dementia with behavioral disturbance Current Psych Provider: Denied Home Meds (current): Denied any mental health medications Previous Med Trials: Previously recommended seroquel  use PRN Therapy: None  Prior Psych Hospitalization: Denied  Prior Self Harm: Denied Prior Violence: Denied  Family Psych History: Unknown Family Hx suicide: Unknown   Social History:   Educational Hx: UTA Occupational Hx: UTA Legal Hx: UTA Living Situation: At nursing home Spiritual Hx: UTA Access to weapons/lethal means: Denied   Substance History Alcohol: UTA  Tobacco: UTA Illicit drugs: UTA Prescription drug abuse: UTA Rehab hx: UTA  Exam Findings  Physical Exam: Deferred to medical MD- note reviewed Vital Signs:  Temp:  [97.4 F (36.3 C)-97.8 F (36.6 C)] 97.8 F (36.6 C) (11/01 1549) Pulse Rate:  [71-78] 78 (11/01 1549) Resp:  [15-18] 15 (11/01 1549) BP: (117-126)/(56-70) 122/60 (11/01 1549) SpO2:  [100 %] 100 % (11/01 1549) Blood pressure 122/60, pulse 78,  temperature 97.8 F (36.6 C), resp. rate 15, height 5' 5 (1.651 m), weight 74 kg, SpO2 100%. Body mass index is 27.15 kg/m.    Mental Status Exam: General Appearance: Casual  Orientation:  Other:  see above  Memory:  Immediate;   impaired Recent;   impaired Remote;   impaired  Concentration:  Concentration: Poor and Attention Span: Poor  Recall:  Poor  Attention  Poor  Eye Contact:  Good  Speech:  Clear and Coherent  Language:  Fair  Volume:  Normal  Mood: labile  Affect:  Congruent  Thought Process:  Irrelevant  Thought Content:  WDL  Suicidal Thoughts:  No  Homicidal Thoughts:  No  Judgement:  Impaired  Insight:  Lacking  Psychomotor Activity:  Normal  Akathisia:  No  Fund of Knowledge:  Fair      Assets:  Housing Social Support  Cognition:  Impaired,  Moderate  ADL's:  Impaired  AIMS (if indicated):        Other History   These have been pulled in through the EMR,  reviewed, and updated if appropriate.  Family History:  The patient's family history includes Asthma in her mother; Kidney disease in her father.  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   CHF (congestive heart failure) (HCC)    Diabetes mellitus without complication (HCC)    HOH (hard of hearing)    extremely HOH   Hypertension    Morbid obesity (HCC) 02/24/2020   Palpitations    occasional related to meds/heart races    Surgical History: Past Surgical History:  Procedure Laterality Date   ABDOMINAL HYSTERECTOMY  1990   CATARACT EXTRACTION W/PHACO Right 12/20/2020   Procedure: CATARACT EXTRACTION PHACO AND INTRAOCULAR LENS PLACEMENT (IOC) RIGHT DIABETIC 8.13 01:17.6 10.5%;  Surgeon: Mittie Gaskin, MD;  Location: [facility];  Service: Ophthalmology;  Laterality: Right;   CATARACT EXTRACTION W/PHACO Left 01/03/2021   Procedure: CATARACT EXTRACTION PHACO AND INTRAOCULAR LENS PLACEMENT (IOC) LEFT DIABETIC  7.37 01:15.6 9.8%;  Surgeon: Mittie Gaskin, MD;  Location: [facility];  Service: Ophthalmology;  Laterality: Left;   CHOLECYSTECTOMY     DILATION AND CURETTAGE OF UTERUS  before 1990   X2   NECK SURGERY  03/30/2008   Per family, pt broke her neck   STOMACH SURGERY     tumor removed, per family     Medications:   Current Facility-Administered Medications:    acetaminophen  (TYLENOL ) tablet 650 mg, 650 mg, Oral, Q6H PRN **OR** acetaminophen  (TYLENOL ) suppository 650 mg, 650 mg, Rectal, Q6H PRN, Cleatus Delayne GAILS, MD   albuterol  (PROVENTIL ) (2.5 MG/3ML) 0.083% nebulizer solution 2.5 mg, 2.5 mg, Nebulization, Q2H PRN, Cleatus Delayne GAILS, MD   apixaban  (ELIQUIS ) tablet 5 mg, 5 mg, Oral, BID, Cleatus Delayne V, MD, 5 mg at 07/23/24 2149   cefTRIAXone  (ROCEPHIN ) 1 g in sodium chloride  0.9 % 100 mL IVPB, 1 g, Intravenous, Q24H, Trudy Anthony HERO, MD, Last Rate: 200 mL/hr at 07/23/24 1221, 1 g at 07/23/24  1221   docusate sodium  (COLACE) capsule 100 mg, 100 mg, Oral, BID, Cleatus Delayne V, MD   haloperidol lactate (HALDOL) injection 5 mg, 5 mg, Intramuscular, Q6H PRN, Trudy Anthony HERO, MD, 5 mg at 07/23/24 1024   insulin  aspart (novoLOG ) injection 0-5 Units, 0-5 Units, Subcutaneous, QHS, Cleatus Delayne GAILS, MD   insulin  aspart (novoLOG ) injection 0-9 Units, 0-9 Units, Subcutaneous, TID WC, Cleatus Delayne GAILS,  MD, 2 Units at 07/23/24 1221   insulin  glargine-yfgn (SEMGLEE ) injection 8 Units, 8 Units, Subcutaneous, Daily, Cleatus Delayne GAILS, MD, 8 Units at 07/23/24 0950   levothyroxine  (SYNTHROID ) tablet 75 mcg, 75 mcg, Oral, Q0600, Dail Rankin RAMAN, RPH   LORazepam  (ATIVAN ) tablet 0.5 mg, 0.5 mg, Oral, BID PRN, Cleatus Delayne GAILS, MD   losartan  (COZAAR ) tablet 50 mg, 50 mg, Oral, Daily, Cleatus Delayne GAILS, MD   metoprolol  tartrate (LOPRESSOR ) tablet 25 mg, 25 mg, Oral, BID, Cleatus Delayne V, MD, 25 mg at 07/23/24 2149   phenol (CHLORASEPTIC) mouth spray 1 spray, 1 spray, Mouth/Throat, PRN, Trudy Anthony HERO, MD, 1 spray at 07/23/24 0004   pravastatin  (PRAVACHOL ) tablet 10 mg, 10 mg, Oral, q1800, Cleatus Delayne GAILS, MD   prochlorperazine (COMPAZINE) injection 5 mg, 5 mg, Intravenous, Q4H PRN, Cleatus Delayne GAILS, MD   sodium chloride  flush (NS) 0.9 % injection 10-40 mL, 10-40 mL, Intracatheter, PRN, Trudy Anthony HERO, MD   sodium chloride  flush (NS) 0.9 % injection 10-40 mL, 10-40 mL, Intracatheter, Q12H, Trudy Anthony HERO, MD, 10 mL at 07/23/24 2151  Allergies: Allergies  Allergen Reactions   Penicillins Rash    Sree [email or web address]

## 2024-07-24 NOTE — Plan of Care (Signed)
  Problem: Education: Goal: Ability to describe self-care measures that may prevent or decrease complications (Diabetes Survival Skills Education) will improve Outcome: Progressing   Problem: Coping: Goal: Ability to adjust to condition or change in health will improve Outcome: Progressing   Problem: Fluid Volume: Goal: Ability to maintain a balanced intake and output will improve Outcome: Progressing   Problem: Health Behavior/Discharge Planning: Goal: Ability to identify and utilize available resources and services will improve Outcome: Progressing Goal: Ability to manage health-related needs will improve Outcome: Progressing   Problem: Metabolic: Goal: Ability to maintain appropriate glucose levels will improve Outcome: Progressing   Problem: Nutritional: Goal: Maintenance of adequate nutrition will improve Outcome: Progressing   Problem: Skin Integrity: Goal: Risk for impaired skin integrity will decrease Outcome: Progressing   Problem: Tissue Perfusion: Goal: Adequacy of tissue perfusion will improve Outcome: Progressing   Problem: Education: Goal: Knowledge of General Education information will improve Description: Including pain rating scale, medication(s)/side effects and non-pharmacologic comfort measures Outcome: Progressing   Problem: Health Behavior/Discharge Planning: Goal: Ability to manage health-related needs will improve Outcome: Progressing   Problem: Clinical Measurements: Goal: Ability to maintain clinical measurements within normal limits will improve Outcome: Progressing Goal: Will remain free from infection Outcome: Progressing Goal: Diagnostic test results will improve Outcome: Progressing Goal: Respiratory complications will improve Outcome: Progressing Goal: Cardiovascular complication will be avoided Outcome: Progressing   Problem: Activity: Goal: Risk for activity intolerance will decrease Outcome: Progressing   Problem:  Nutrition: Goal: Adequate nutrition will be maintained Outcome: Progressing   Problem: Coping: Goal: Level of anxiety will decrease Outcome: Progressing   Problem: Elimination: Goal: Will not experience complications related to bowel motility Outcome: Progressing Goal: Will not experience complications related to urinary retention Outcome: Progressing   Problem: Pain Managment: Goal: General experience of comfort will improve and/or be controlled Outcome: Progressing   Problem: Safety: Goal: Ability to remain free from injury will improve Outcome: Progressing   Problem: Skin Integrity: Goal: Risk for impaired skin integrity will decrease Outcome: Progressing

## 2024-07-25 DIAGNOSIS — G9341 Metabolic encephalopathy: Secondary | ICD-10-CM | POA: Diagnosis not present

## 2024-07-25 LAB — CBC
HCT: 34.2 % — ABNORMAL LOW (ref 36.0–46.0)
Hemoglobin: 11.2 g/dL — ABNORMAL LOW (ref 12.0–15.0)
MCH: 29.7 pg (ref 26.0–34.0)
MCHC: 32.7 g/dL (ref 30.0–36.0)
MCV: 90.7 fL (ref 80.0–100.0)
Platelets: 187 K/uL (ref 150–400)
RBC: 3.77 MIL/uL — ABNORMAL LOW (ref 3.87–5.11)
RDW: 15.2 % (ref 11.5–15.5)
WBC: 5.7 K/uL (ref 4.0–10.5)
nRBC: 0 % (ref 0.0–0.2)

## 2024-07-25 LAB — BASIC METABOLIC PANEL WITH GFR
Anion gap: 9 (ref 5–15)
BUN: 9 mg/dL (ref 8–23)
CO2: 21 mmol/L — ABNORMAL LOW (ref 22–32)
Calcium: 8.3 mg/dL — ABNORMAL LOW (ref 8.9–10.3)
Chloride: 107 mmol/L (ref 98–111)
Creatinine, Ser: 1.25 mg/dL — ABNORMAL HIGH (ref 0.44–1.00)
GFR, Estimated: 43 mL/min — ABNORMAL LOW (ref >60)
Glucose, Bld: 89 mg/dL (ref 70–99)
Potassium: 3.3 mmol/L — ABNORMAL LOW (ref 3.5–5.1)
Sodium: 137 mmol/L (ref 135–145)

## 2024-07-25 LAB — PHOSPHORUS: Phosphorus: 3.9 mg/dL (ref 2.5–4.6)

## 2024-07-25 LAB — GLUCOSE, CAPILLARY
Glucose-Capillary: 76 mg/dL (ref 70–99)
Glucose-Capillary: 91 mg/dL (ref 70–99)
Glucose-Capillary: 71 mg/dL (ref 70–99)
Glucose-Capillary: 99 mg/dL (ref 70–99)

## 2024-07-25 LAB — MAGNESIUM: Magnesium: 1.9 mg/dL (ref 1.7–2.4)

## 2024-07-25 MED ORDER — OLANZAPINE 10 MG IM SOLR
2.5000 mg | Freq: Once | INTRAMUSCULAR | Status: DC
Start: 2024-07-25 — End: 2024-07-27
  Filled 2024-07-25: qty 10

## 2024-07-25 MED ORDER — POTASSIUM CHLORIDE 20 MEQ PO PACK
40.0000 meq | PACK | Freq: Once | ORAL | Status: AC
  Administered 2024-07-25: 40 meq via ORAL
  Filled 2024-07-25: qty 2

## 2024-07-25 MED ORDER — CEFADROXIL 500 MG PO CAPS: 500.0000 mg | ORAL_CAPSULE | Freq: Two times a day (BID) | ORAL | Status: DC

## 2024-07-25 NOTE — Plan of Care (Signed)
  Problem: Education: Goal: Ability to describe self-care measures that may prevent or decrease complications (Diabetes Survival Skills Education) will improve Outcome: Progressing   Problem: Coping: Goal: Ability to adjust to condition or change in health will improve Outcome: Progressing   Problem: Fluid Volume: Goal: Ability to maintain a balanced intake and output will improve Outcome: Progressing   Problem: Health Behavior/Discharge Planning: Goal: Ability to identify and utilize available resources and services will improve Outcome: Progressing Goal: Ability to manage health-related needs will improve Outcome: Progressing   Problem: Metabolic: Goal: Ability to maintain appropriate glucose levels will improve Outcome: Progressing   Problem: Nutritional: Goal: Maintenance of adequate nutrition will improve Outcome: Progressing   Problem: Skin Integrity: Goal: Risk for impaired skin integrity will decrease Outcome: Progressing   Problem: Tissue Perfusion: Goal: Adequacy of tissue perfusion will improve Outcome: Progressing   Problem: Education: Goal: Knowledge of General Education information will improve Description: Including pain rating scale, medication(s)/side effects and non-pharmacologic comfort measures Outcome: Progressing   Problem: Health Behavior/Discharge Planning: Goal: Ability to manage health-related needs will improve Outcome: Progressing   Problem: Clinical Measurements: Goal: Ability to maintain clinical measurements within normal limits will improve Outcome: Progressing Goal: Will remain free from infection Outcome: Progressing Goal: Diagnostic test results will improve Outcome: Progressing Goal: Respiratory complications will improve Outcome: Progressing Goal: Cardiovascular complication will be avoided Outcome: Progressing   Problem: Activity: Goal: Risk for activity intolerance will decrease Outcome: Progressing   Problem:  Nutrition: Goal: Adequate nutrition will be maintained Outcome: Progressing   Problem: Coping: Goal: Level of anxiety will decrease Outcome: Progressing   Problem: Elimination: Goal: Will not experience complications related to bowel motility Outcome: Progressing Goal: Will not experience complications related to urinary retention Outcome: Progressing   Problem: Pain Managment: Goal: General experience of comfort will improve and/or be controlled Outcome: Progressing   Problem: Safety: Goal: Ability to remain free from injury will improve Outcome: Progressing   Problem: Skin Integrity: Goal: Risk for impaired skin integrity will decrease Outcome: Progressing

## 2024-07-25 NOTE — Progress Notes (Signed)
 Progress Note    Tanya Frye  FMW:[id number] DOB: Oct 29, 1940  DOA: 07/20/2024 PCP: Zenaida Morene PARAS, MD      Brief Narrative:    Medical records reviewed and are as summarized below:  Tanya Frye is a 83 y.o. female with medical history significant for recently diagnosed UTI, COPD, chronic HFrEF, HTN, IDDM, PAF on Eliquis , CKD stage IIIa, dementia, was referred from [facility] to the ED because of increasing altered mental status, with aggressive behavior and combativeness and refusal to take her medications.  In the ED, she was hypothermic and tachycardia with heart rate in the 120s. Labs significant for potassium 2.9, magnesium  1.5.  EKG significant for QTc of 656.  She was placed on a Bair hugger in the ED.  She was given some potassium chloride , IV fluids and IV ceftriaxone .    Assessment/Plan:   Principal Problem:   Acute metabolic encephalopathy Active Problems:   Cellulitis of left lower extremity   Dementia with behavioral disturbance (HCC)   Hypokalemia   Hypomagnesemia   Prolonged QT interval   Chronic HFrEF (heart failure with reduced ejection fraction) (HCC)   Hypertension   Stage 3a chronic kidney disease (HCC)   Paroxysmal atrial fibrillation (HCC)   Diabetes mellitus without complication (HCC)   Hypothyroidism   COPD (chronic obstructive pulmonary disease) (HCC)     Body mass index is 27.15 kg/m.   Acute metabolic encephalopathy on underlying dementia with behavioral disturbance: Continue supportive care.  Reorientation as able.   Left lower extremity cellulitis: Completed 5 days of IV ceftriaxone  on 07/25/2024.  Start cefadroxil and continue for 2 more days   Hypokalemia: Potassium dropped again from 3.7-3.3.  Replete and monitor. Hypomagnesemia: Improved   Prolonged QTc interval (656) on admission: Repeat EKG on 07/25/2023 showed improvement to 406.   Chronic diastolic CHF, history of systolic CHF with recovered  EF: Compensated. 2D echo on 06/30/2024 showed EF estimated at 55 to 60% mild to moderate MR, moderate calcification of the aortic valve, unable to exclude AAS.   CKD stage IIIa: Creatinine is labile.   COPD: Continue bronchodilators.   Paroxysmal atrial fibrillation on metoprolol  and Eliquis    Comorbidities include hypertension, type II DM  Transferred from progressive care unit to MedSurg unit    Diet Order             Diet heart healthy/carb modified Room service appropriate? Yes; Fluid consistency: Thin  Diet effective now                                  Consultants: Psychiatrist  Procedures: None    Medications:    apixaban   5 mg Oral BID   docusate sodium   100 mg Oral BID   insulin  aspart  0-5 Units Subcutaneous QHS   insulin  aspart  0-9 Units Subcutaneous TID WC   insulin  glargine-yfgn  8 Units Subcutaneous Daily   levothyroxine   75 mcg Oral Q0600   losartan   50 mg Oral Daily   metoprolol  tartrate  25 mg Oral BID   OLANZapine  2.5 mg Intramuscular Once   pravastatin   10 mg Oral q1800   sodium chloride  flush  10-40 mL Intracatheter Q12H   Continuous Infusions:  cefTRIAXone  (ROCEPHIN )  IV 1 g (07/25/24 1022)     Anti-infectives (From admission, onward)    Start     Dose/Rate Route Frequency Ordered Stop  07/22/24 1000  cefTRIAXone  (ROCEPHIN ) 1 g in sodium chloride  0.9 % 100 mL IVPB        1 g 200 mL/hr over 30 Minutes Intravenous Every 24 hours 07/21/24 1412 07/26/24 0959   07/21/24 1500  cefTRIAXone  (ROCEPHIN ) 1 g in sodium chloride  0.9 % 100 mL IVPB  Status:  Discontinued        1 g 200 mL/hr over 30 Minutes Intravenous Every 24 hours 07/21/24 1412 07/21/24 1412   07/21/24 0200  cefTRIAXone  (ROCEPHIN ) 1 g in sodium chloride  0.9 % 100 mL IVPB        1 g 200 mL/hr over 30 Minutes Intravenous  Once 07/21/24 0148 07/21/24 0235   07/21/24 0200  vancomycin (VANCOREADY) IVPB 1750 mg/350 mL        1,750 mg 175 mL/hr over 120  Minutes Intravenous  Once 07/21/24 0158 07/21/24 0521              Family Communication/Anticipated D/C date and plan/Code Status   DVT prophylaxis:  apixaban  (ELIQUIS ) tablet 5 mg     Code Status: Limited: Do not attempt resuscitation (DNR) -DNR-LIMITED -Do Not Intubate/DNI   Family Communication: None Disposition Plan: Plan to discharge to SNF   Status is: Inpatient Remains inpatient appropriate because: Left leg cellulitis       Subjective:   Interval events noted.  She has no complaints.  Objective:    Vitals:   07/24/24 2152 07/25/24 0110 07/25/24 0428 07/25/24 0812  BP: 122/67 139/74 135/63 129/64  Pulse: (!) 101 90 87 86  Resp: 16 16 16 18   Temp: 97.7 F (36.5 C) 97.6 F (36.4 C) (!) 97.4 F (36.3 C) 98.1 F (36.7 C)  TempSrc:      SpO2: 99% 98% 100% 100%  Weight:      Height:       No data found.   Intake/Output Summary (Last 24 hours) at 07/25/2024 1122 Last data filed at 07/25/2024 1013 Gross per 24 hour  Intake 320 ml  Output --  Net 320 ml   Filed Weights   07/20/24 1747  Weight: 74 kg    Exam:   GEN: NAD SKIN: Warm and dry EYES: No pallor or icterus ENT: MMM CV: RRR PULM: CTA B ABD: soft, obese, NT, +BS CNS: Alert, confused EXT: Some left leg edema with erythema compared to the right.  No tenderness.       Data Reviewed:   I have personally reviewed following labs and imaging studies:  Labs: Labs show the following:   Basic Metabolic Panel: Recent Labs  Lab 07/20/24 1755 07/21/24 0423 07/22/24 0419 07/23/24 0344 07/24/24 0520 07/25/24 0551  NA 135 140 138 140 135 137  K 2.9* 2.7* 3.9 3.6 3.7 3.3*  CL 95* 101 105 109 103 107  CO2 21* 23 25 24 23  21*  GLUCOSE 179* 88 105* 99 105* 89  BUN 13 11 10 9 9 9   CREATININE 1.31* 1.09* 1.08* 1.17* 1.02* 1.25*  CALCIUM 9.2 7.9* 8.5* 8.4* 8.3* 8.3*  MG 1.5*  --  1.7 1.5* 2.1 1.9  PHOS  --   --  2.7 3.4 4.1 3.9   GFR Estimated Creatinine Clearance: 34.9  mL/min (A) (by C-G formula based on SCr of 1.25 mg/dL (H)). Liver Function Tests: Recent Labs  Lab 07/20/24 1755  AST 43*  ALT 33  ALKPHOS 39  BILITOT 1.1  PROT 7.3  ALBUMIN 3.9   No results for input(s): LIPASE, AMYLASE in the last  168 hours. No results for input(s): AMMONIA in the last 168 hours. Coagulation profile No results for input(s): INR, PROTIME in the last 168 hours.  CBC: Recent Labs  Lab 07/21/24 0423 07/22/24 0419 07/23/24 0344 07/24/24 0520 07/25/24 0551  WBC 6.8 7.9 5.6 8.1 5.7  HGB 10.8* 12.0 11.4* 12.7 11.2*  HCT 32.8* 38.0 35.3* 39.5 34.2*  MCV 90.6 92.0 91.5 91.0 90.7  PLT 187 218 175 245 187   Cardiac Enzymes: Recent Labs  Lab 07/20/24 1939  CKTOTAL 61   BNP (last 3 results) No results for input(s): PROBNP in the last 8760 hours. CBG: Recent Labs  Lab 07/23/24 2035 07/24/24 0821 07/24/24 1552 07/24/24 2120 07/25/24 0804  GLUCAP 110* 104* 95 99 76   D-Dimer: No results for input(s): DDIMER in the last 72 hours. Hgb A1c: No results for input(s): HGBA1C in the last 72 hours. Lipid Profile: No results for input(s): CHOL, HDL, LDLCALC, TRIG, CHOLHDL, LDLDIRECT in the last 72 hours. Thyroid  function studies: No results for input(s): TSH, T4TOTAL, T3FREE, THYROIDAB in the last 72 hours.  Invalid input(s): FREET3 Anemia work up: No results for input(s): VITAMINB12, FOLATE, FERRITIN, TIBC, IRON, RETICCTPCT in the last 72 hours. Sepsis Labs: Recent Labs  Lab 07/21/24 0215 07/21/24 0342 07/21/24 0423 07/22/24 0419 07/23/24 0344 07/24/24 0520 07/25/24 0551  WBC  --   --    < > 7.9 5.6 8.1 5.7  LATICACIDVEN 1.6 1.2  --   --   --   --   --    < > = values in this interval not displayed.    Microbiology Recent Results (from the past 240 hours)  Culture, blood (routine x 2)     Status: None (Preliminary result)   Collection Time: 07/21/24  2:15 AM   Specimen: BLOOD  Result Value Ref  Range Status   Specimen Description BLOOD RIGHT ARM  Final   Special Requests   Final    BOTTLES DRAWN AEROBIC AND ANAEROBIC Blood Culture results may not be optimal due to an inadequate volume of blood received in culture bottles   Culture   Final    NO GROWTH 4 DAYS Performed at [facility], [address], [place], [place] [address]    Report Status PENDING  Incomplete  Culture, blood (routine x 2)     Status: None (Preliminary result)   Collection Time: 07/21/24  2:16 AM   Specimen: BLOOD  Result Value Ref Range Status   Specimen Description BLOOD LEFT ARM  Final   Special Requests   Final    BOTTLES DRAWN AEROBIC AND ANAEROBIC Blood Culture adequate volume   Culture   Final    NO GROWTH 4 DAYS Performed at [facility], [address] [place], [place] [address]    Report Status PENDING  Incomplete    Procedures and diagnostic studies:  No results found.             LOS: 4 days   Marcelline Temkin  [facility]   Pager on [email or web address]. If 7PM-7AM, please contact night-coverage at [email or web address]     07/25/2024, 11:22 AM

## 2024-07-25 NOTE — Plan of Care (Signed)
  Problem: Fluid Volume: Goal: Ability to maintain a balanced intake and output will improve Outcome: Progressing   Problem: Nutritional: Goal: Maintenance of adequate nutrition will improve Outcome: Progressing   Problem: Skin Integrity: Goal: Risk for impaired skin integrity will decrease Outcome: Progressing   Problem: Activity: Goal: Risk for activity intolerance will decrease Outcome: Progressing   Problem: Elimination: Goal: Will not experience complications related to bowel motility Outcome: Progressing Goal: Will not experience complications related to urinary retention Outcome: Progressing

## 2024-07-25 NOTE — Progress Notes (Signed)
 Physical Therapy Treatment Patient Details Name: Tanya Frye MRN: [id number] DOB: 04-24-1941 Today's Date: 07/25/2024   History of Present Illness Tanya Frye is a 83 y.o. female with medical history significant for COPD, chronic HFrEF, HTN, IDDM, PAF on Eliquis , CKD stage IIIa, dementia, being admitted with left lower extremity cellulitis and electrolyte derangement presenting with altered mental status and increased agitation.  She was sent in by her facility [facility].    PT Comments  Pt sitting on BSC with nurse upon arrival.  She is difficult to agree to getting up off BSC wanting to finish her breakfast sitting on BSC.  She is given time.  Asks for pull up.  Given underwear with pad which was agreeable to pt.  She stands and transfers to bed with min a x 1 for safety.  Refuses to attempt gait at this time despite different approaches and self initiates return to supine.     If plan is discharge home, recommend the following: A little help with walking and/or transfers;A little help with bathing/dressing/bathroom;Assistance with cooking/housework;Supervision due to cognitive status;Assist for transportation   Can travel by private vehicle        Equipment Recommendations       Recommendations for Other Services       Precautions / Restrictions Precautions Precautions: Fall Recall of Precautions/Restrictions: Impaired Restrictions Weight Bearing Restrictions Per Provider Order: No     Mobility  Bed Mobility   Bed Mobility: Sit to Supine       Sit to supine: Contact guard assist        Transfers Overall transfer level: Needs assistance Equipment used: 1 person hand held assist Transfers: Sit to/from Stand Sit to Stand: Supervision                Ambulation/Gait               General Gait Details: refuses   Stairs             Wheelchair Mobility     Tilt Bed    Modified Rankin (Stroke Patients Only)       Balance Overall  balance assessment: Needs assistance Sitting-balance support: Feet supported, No upper extremity supported Sitting balance-Leahy Scale: Good     Standing balance support: Single extremity supported Standing balance-Leahy Scale: Fair                              Hotel Manager: Impaired Factors Affecting Communication: Hearing impaired  Cognition Arousal: Alert     PT - Cognitive impairments: Orientation, Memory, Problem solving, Safety/Judgement                       PT - Cognition Comments: self directs session - limits to transfer only Following commands: Impaired Following commands impaired: Follows one step commands inconsistently    Cueing    Exercises      General Comments        Pertinent Vitals/Pain Pain Assessment Pain Assessment: No/denies pain    Home Living                          Prior Function            PT Goals (current goals can now be found in the care plan section) Progress towards PT goals: Progressing toward goals    Frequency    Min  1X/week      PT Plan      Co-evaluation              AM-PAC PT 6 Clicks Mobility   Outcome Measure  Help needed turning from your back to your side while in a flat bed without using bedrails?: A Little Help needed moving from lying on your back to sitting on the side of a flat bed without using bedrails?: A Little Help needed moving to and from a bed to a chair (including a wheelchair)?: A Little Help needed standing up from a chair using your arms (e.g., wheelchair or bedside chair)?: A Little Help needed to walk in hospital room?: A Little Help needed climbing 3-5 steps with a railing? : A Little 6 Click Score: 18    End of Session   Activity Tolerance: Other (comment) (cognition seems to be most limiting factor) Patient left: with bed alarm set;with call bell/phone within reach;in bed Nurse Communication: Mobility status PT  Visit Diagnosis: Muscle weakness (generalized) (M62.81);Unsteadiness on feet (R26.81)     Time: 9149-9095 PT Time Calculation (min) (ACUTE ONLY): 14 min  Charges:    $Gait Training: 8-22 mins PT General Charges $$ ACUTE PT VISIT: 1 Visit                   Lauraine Gills, PTA 07/25/24, 9:10 AM

## 2024-07-26 ENCOUNTER — Encounter: Admitting: Family

## 2024-07-26 DIAGNOSIS — I5022 Chronic systolic (congestive) heart failure: Secondary | ICD-10-CM | POA: Diagnosis not present

## 2024-07-26 DIAGNOSIS — G9341 Metabolic encephalopathy: Secondary | ICD-10-CM | POA: Diagnosis not present

## 2024-07-26 DIAGNOSIS — F03918 Unspecified dementia, unspecified severity, with other behavioral disturbance: Secondary | ICD-10-CM | POA: Diagnosis not present

## 2024-07-26 DIAGNOSIS — E039 Hypothyroidism, unspecified: Secondary | ICD-10-CM | POA: Diagnosis not present

## 2024-07-26 LAB — PHOSPHORUS: Phosphorus: 2.9 mg/dL (ref 2.5–4.6)

## 2024-07-26 LAB — CBC
HCT: 35.9 % — ABNORMAL LOW (ref 36.0–46.0)
Hemoglobin: 11.7 g/dL — ABNORMAL LOW (ref 12.0–15.0)
MCH: 29.3 pg (ref 26.0–34.0)
MCHC: 32.6 g/dL (ref 30.0–36.0)
MCV: 89.8 fL (ref 80.0–100.0)
Platelets: 234 K/uL (ref 150–400)
RBC: 4 MIL/uL (ref 3.87–5.11)
RDW: 15.2 % (ref 11.5–15.5)
WBC: 6.6 K/uL (ref 4.0–10.5)
nRBC: 0 % (ref 0.0–0.2)

## 2024-07-26 LAB — BASIC METABOLIC PANEL WITH GFR
Anion gap: 10 (ref 5–15)
BUN: 9 mg/dL (ref 8–23)
CO2: 22 mmol/L (ref 22–32)
Calcium: 8.3 mg/dL — ABNORMAL LOW (ref 8.9–10.3)
Chloride: 105 mmol/L (ref 98–111)
Creatinine, Ser: 1.21 mg/dL — ABNORMAL HIGH (ref 0.44–1.00)
GFR, Estimated: 45 mL/min — ABNORMAL LOW (ref >60)
Glucose, Bld: 89 mg/dL (ref 70–99)
Potassium: 3.6 mmol/L (ref 3.5–5.1)
Sodium: 137 mmol/L (ref 135–145)

## 2024-07-26 LAB — CULTURE, BLOOD (ROUTINE X 2)
Culture: NO GROWTH
Culture: NO GROWTH
Special Requests: ADEQUATE

## 2024-07-26 LAB — MAGNESIUM: Magnesium: 1.9 mg/dL (ref 1.7–2.4)

## 2024-07-26 LAB — GLUCOSE, CAPILLARY: Glucose-Capillary: 81 mg/dL (ref 70–99)

## 2024-07-26 MED ORDER — CEFADROXIL 500 MG PO CAPS: 500.0000 mg | ORAL_CAPSULE | Freq: Two times a day (BID) | ORAL | 0 refills | Status: AC

## 2024-07-26 NOTE — Consult Note (Signed)
 [facility] Consult Follow-up  Patient Name: .Tanya Frye  MRN: [id number]  DOB: 1941-03-07  Consult Order details:  Orders (From admission, onward)     Start     Ordered   07/20/24 2123  IP CONSULT TO PSYCHIATRY       Ordering Provider: Dorothyann Drivers, MD  Provider:  (Not yet assigned)  Question Answer Comment  Consult Timeframe ROUTINE - requires response within 24 hours   Reason for Consult? Consult for medication management   Contact phone number where the requesting provider can be reached [phone]      07/20/24 2122             Mode of Visit: In person    Psychiatry Consult Evaluation  Service Date: July 26, 2024 LOS:  LOS: 5 days  Chief Complaint Increased confusion and aggression reported by nursing facility  Primary Psychiatric Diagnoses  Dementia with behavioral disturbance    Assessment  Tanya Frye is a 83 y.o. female admitted: Medically  Patient was consulted to psychiatry due to having diagnosed dementia with behavioral disturbances.  Per review of nursing notes, it appears patient has been refusing to take her home medications or her medications that were prescribed for her UTI.  Per review of notes, it appears that this instance is the first time that patient has become physical with the staff at her residence as well as being aggressive on initial presentation to the emergency department requiring IM agitation meds.    07/26/2024: Patient seen for follow up was pleasant on exam Continues to be disoriented, likely near baselines. She was able to tell name, birthday, and that she was in the hospital. Plan is for discharge today.  Previous provider considered starting Depakote sprinkles and they were not started.  07/24/2024: Patient continues to be confused and not able to engage in a meaningful interview. RN at beside and reports that she continues to refuse medications and is irritable during some areas of care. She is allowing vital signs  and would not allow IV flush today. Recommendations of repeat LFTs was not drawn, will place order for AM labs and follow plan for depakote sprinkles if appropriate.  07/22/24: Patient remains confused.  Patient has hard of hearing and responded to the questions by writing.  After 2 questions she started scribbling on the paper.  Per sitter patient remains irritable and constantly tries to get out of the bed.  Given the prolonged QTc we continue to recommend Ativan  as needed.  LFTs are slightly elevated with increased ALT.  Repeat LFTs will recommend to consider Depakote sprinkles to 50 mg twice daily. 07/21/24:On assessment today, patient was able to tell me her name and birthday as well as that she was at the hospital and the current year was 2025.  She was also able to accurately tell me the upcoming holidays being Halloween and Thanksgiving.  Patient's current QTc is noted to be 656 which limits psychiatry's ability significantly to intervene with medications at this time.  We will continue to monitor patient's behavior while hospitalized medically.  We do recommend to continue with the 0.5 mg Ativan  2 times daily as needed due to patient's significantly prolonged QTc.  If behaviors increase or worsen, we discussed with medical team about potentially adding Depakote sprinkles.  We will continue to monitor patient while medically admitted to assess for need of this medication.  Diagnoses:  Active Hospital problems: Principal Problem:   Acute metabolic encephalopathy Active Problems:  Hypertension   Chronic HFrEF (heart failure with reduced ejection fraction) (HCC)   COPD (chronic obstructive pulmonary disease) (HCC)   Hypothyroidism   Diabetes mellitus without complication (HCC)   Hypokalemia   Hypomagnesemia   Stage 3a chronic kidney disease (HCC)   Paroxysmal atrial fibrillation (HCC)   Cellulitis of left lower extremity   Prolonged QT interval   Dementia with behavioral disturbance (HCC)     Plan   ## Psychiatric Medication Recommendations:  Continue ativan  0.5mg  2 times daily PRN due to patient qtc  ## Medical Decision Making Capacity: Not specifically addressed in this encounter  ## Further Work-up:   -- most recent EKG on 07/21/2024 had QtC of 656 -- Pertinent labwork reviewed earlier this admission includes: cbc, cmp   ## Disposition:-- There are no psychiatric contraindications to discharge at this time  ## Behavioral / Environmental: -Delirium Precautions: Delirium Interventions for Nursing and Staff: - RN to open blinds every AM. - To Bedside: Glasses, hearing aide, and pt's own shoes. Make available to patients. when possible and encourage use. - Encourage po fluids when appropriate, keep fluids within reach. - OOB to chair with meals. - Passive ROM exercises to all extremities with AM & PM care. - RN to assess orientation to person, time and place QAM and PRN. - Recommend extended visitation hours with familiar family/friends as feasible. - Staff to minimize disturbances at night. Turn off television when pt asleep or when not in use.    ## Safety and Observation Level:  - Based on my clinical evaluation, I estimate the patient to be at low risk of self harm in the current setting. - At this time, we recommend  1:1 Observation. This decision is based on my review of the chart including patient's history and current presentation, interview of the patient, mental status examination, and consideration of suicide risk including evaluating suicidal ideation, plan, intent, suicidal or self-harm behaviors, risk factors, and protective factors. This judgment is based on our ability to directly address suicide risk, implement suicide prevention strategies, and develop a safety plan while the patient is in the clinical setting. Please contact our team if there is a concern that risk level has changed.  CSSR Risk Category:C-SSRS RISK CATEGORY: No Risk  Suicide Risk  Assessment: Patient has following modifiable risk factors for suicide: N/A, which we are addressing by using therapeutic communication when talking with patient. Patient has following non-modifiable or demographic risk factors for suicide: N/A Patient has the following protective factors against suicide: Supportive family, no history of suicide attempts, and no history of NSSIB  Thank you for this consult request. Recommendations have been communicated to the primary team.  We will sign off at this time.         History of Present Illness  Relevant Aspects of Hospital Hospital   Patient Report:  07/26/24: Patient is confused but pleasant today.  She is oriented to self hospital and date of birth.  She was not aware why she was in the hospital.  She continues to be hard of hearing.  She was cooperative with staff during the encounter.  She discusses her family.  She continues to be a poor historian and is often tangential.  She is not observed to be agitated or aggressive.  She has been medically cleared and is being discharged back to her home facility today. 07/24/2024: Patient continues to be confused and not able to engage in a meaningful interview. RN at beside and reports that she  continues to refuse medications and is irritable during some areas of care. She is allowing vital signs and would not allow IV flush today. Recommendations of repeat LFTs was not drawn, will place order for AM labs and follow plan for depakote sprinkles if appropriate.  07/22/24: Patient is noted to be resting in bed.  She talks about just needing her blanket and to sandals.  She is hard of hearing and most of the questions are written on the paper.  She was unable to give details about her hospitalization.  Patient is on thin liquids and per sitter patient is able to take the liquids.  Per sitter patient is constantly trying to get out of the bed and wander around.  Patient is irritable but not physically aggressive.   Patient is not displaying any self-harming behaviors at this time. 07/21/24:Patient was consulted to psychiatry due to having diagnosed dementia with behavioral disturbances.  Per review of nursing notes, it appears patient has been refusing to take her home medications or her medications that were prescribed for her UTI.  Per review of notes, it appears that this instance is the first time that patient has become physical with the staff at her residence as well as being aggressive on initial presentation to the emergency department requiring IM agitation meds.  On assessment today, patient was able to tell me her name and birthday as well as that she was at the hospital and the current year was 2025.  She was also able to accurately tell me the upcoming holidays being Halloween and Thanksgiving.  Patient's current QTc is noted to be 656 which limits psychiatry's ability significantly to intervene with medications at this time.  We will continue to monitor patient's behavior while hospitalized medically.  We do recommend to continue with the 0.5 mg Ativan  2 times daily as needed due to patient's significantly prolonged QTc.  If behaviors increase or worsen, we discussed with medical team about potentially adding Depakote sprinkles. We will have to discuss this with patient POA or emergency contact due to patient current mental status. We will continue to monitor patient while medically admitted to assess for need of this medication.  Patient did endorse getting upset with staff at her nursing facility due to slow response times.  She reported having to take herself to the bathroom or having to wait long. She also stated that some staff will wipe front to back and others will wipe back to front.  When asked about getting physically aggressive with staff, patient denied any memory of these events.  Patient often required redirection in conversation.  She would often go on a tangent about her surviving family  member, Juliene when asked assessment questions. Patient is also very hard of hearing which further impaired assessment.  Due to patient's baseline cognitive status, as stated above patient is not a candidate for inpatient psychiatric treatment due to inability to effectively participate in treatment programming.  It does appear at this time, that patient's symptoms are likely related to her previously diagnosed dementia.  Psychiatry will continue to assist medical team with management of behaviors.  Psych ROS:  Depression: Denied Anxiety:  Denied Mania (lifetime and current): Denied Psychosis: (lifetime and current): Denied      Psychiatric and Social History  Psychiatric History:  Information collected from Chart review due to patient being poor historian  Prev Dx/Sx: Dementia with behavioral disturbance Current Psych Provider: Denied Home Meds (current): Denied any mental health medications Previous Med Trials: Previously  recommended seroquel  use PRN Therapy: None  Prior Psych Hospitalization: Denied  Prior Self Harm: Denied Prior Violence: Denied  Family Psych History: Unknown Family Hx suicide: Unknown   Social History:   Educational Hx: UTA Occupational Hx: UTA Legal Hx: UTA Living Situation: At nursing home Spiritual Hx: UTA Access to weapons/lethal means: Denied   Substance History Alcohol: UTA  Tobacco: UTA Illicit drugs: UTA Prescription drug abuse: UTA Rehab hx: UTA  Exam Findings  Physical Exam: Deferred to medical MD- note reviewed Vital Signs:  Temp:  [97.7 F (36.5 C)-98 F (36.7 C)] 97.7 F (36.5 C) (11/03 0805) Pulse Rate:  [66-80] 66 (11/03 0805) Resp:  [14-17] 14 (11/03 0805) BP: (124-139)/(62-87) 124/62 (11/03 0805) SpO2:  [100 %] 100 % (11/03 0805) Blood pressure 124/62, pulse 66, temperature 97.7 F (36.5 C), temperature source Oral, resp. rate 14, height 5' 5 (1.651 m), weight 74 kg, SpO2 100%. Body mass index is 27.15  kg/m.    Mental Status Exam: General Appearance: Casual  Orientation:  Other:  see above  Memory:  Immediate;   impaired Recent;   impaired Remote;   impaired  Concentration:  Concentration: Poor and Attention Span: Poor  Recall:  Poor  Attention  Poor  Eye Contact:  Good  Speech:  Clear and Coherent  Language:  Fair  Volume:  Normal  Mood: labile  Affect:  Congruent  Thought Process:  Irrelevant  Thought Content:  WDL  Suicidal Thoughts:  No  Homicidal Thoughts:  No  Judgement:  Impaired  Insight:  Lacking  Psychomotor Activity:  Normal  Akathisia:  No  Fund of Knowledge:  Fair      Assets:  Housing Social Support  Cognition:  Impaired,  Moderate  ADL's:  Impaired  AIMS (if indicated):        Other History   These have been pulled in through the EMR, reviewed, and updated if appropriate.  Family History:  The patient's family history includes Asthma in her mother; Kidney disease in her father.  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   CHF (congestive heart failure) (HCC)    Diabetes mellitus without complication (HCC)    HOH (hard of hearing)    extremely HOH   Hypertension    Morbid obesity (HCC) 02/24/2020   Palpitations    occasional related to meds/heart races    Surgical History: Past Surgical History:  Procedure Laterality Date   ABDOMINAL HYSTERECTOMY  1990   CATARACT EXTRACTION W/PHACO Right 12/20/2020   Procedure: CATARACT EXTRACTION PHACO AND INTRAOCULAR LENS PLACEMENT (IOC) RIGHT DIABETIC 8.13 01:17.6 10.5%;  Surgeon: Mittie Gaskin, MD;  Location: [facility];  Service: Ophthalmology;  Laterality: Right;   CATARACT EXTRACTION W/PHACO Left 01/03/2021   Procedure: CATARACT EXTRACTION PHACO AND INTRAOCULAR LENS PLACEMENT (IOC) LEFT DIABETIC  7.37 01:15.6 9.8%;  Surgeon: Mittie Gaskin, MD;  Location: [facility];  Service: Ophthalmology;  Laterality: Left;    CHOLECYSTECTOMY     DILATION AND CURETTAGE OF UTERUS  before 1990   X2   NECK SURGERY  03/30/2008   Per family, pt broke her neck   STOMACH SURGERY     tumor removed, per family     Medications:   Current Facility-Administered Medications:    acetaminophen  (TYLENOL ) tablet 650 mg, 650 mg, Oral, Q6H PRN **OR** acetaminophen  (TYLENOL ) suppository 650 mg, 650 mg, Rectal, Q6H PRN, Cleatus Delayne GAILS, MD   albuterol  (PROVENTIL ) (2.5  MG/3ML) 0.083% nebulizer solution 2.5 mg, 2.5 mg, Nebulization, Q2H PRN, Cleatus Delayne GAILS, MD   apixaban  (ELIQUIS ) tablet 5 mg, 5 mg, Oral, BID, Duncan, Hazel V, MD, 5 mg at 07/25/24 2200   [START ON 07/27/2024] cefadroxil (DURICEF) capsule 500 mg, 500 mg, Oral, BID, Jens Durand, MD   docusate sodium  (COLACE) capsule 100 mg, 100 mg, Oral, BID, Cleatus Delayne V, MD, 100 mg at 07/25/24 2200   haloperidol lactate (HALDOL) injection 5 mg, 5 mg, Intramuscular, Q6H PRN, Trudy Anthony HERO, MD, 5 mg at 07/23/24 1024   insulin  glargine-yfgn (SEMGLEE ) injection 8 Units, 8 Units, Subcutaneous, Daily, Cleatus Delayne GAILS, MD, 8 Units at 07/23/24 9049   levothyroxine  (SYNTHROID ) tablet 75 mcg, 75 mcg, Oral, Q0600, Dail Rankin RAMAN, RPH   LORazepam  (ATIVAN ) tablet 0.5 mg, 0.5 mg, Oral, BID PRN, Cleatus Delayne GAILS, MD   losartan  (COZAAR ) tablet 50 mg, 50 mg, Oral, Daily, Cleatus Delayne GAILS, MD   metoprolol  tartrate (LOPRESSOR ) tablet 25 mg, 25 mg, Oral, BID, Cleatus Delayne V, MD, 25 mg at 07/25/24 2200   OLANZapine (ZYPREXA) injection 2.5 mg, 2.5 mg, Intramuscular, Once, Mansy, Jan A, MD   phenol (CHLORASEPTIC) mouth spray 1 spray, 1 spray, Mouth/Throat, PRN, Trudy Anthony HERO, MD, 1 spray at 07/23/24 0004   pravastatin  (PRAVACHOL ) tablet 10 mg, 10 mg, Oral, q1800, Cleatus Delayne GAILS, MD, 10 mg at 07/25/24 1727   prochlorperazine (COMPAZINE) injection 5 mg, 5 mg, Intravenous, Q4H PRN, Cleatus Delayne GAILS, MD   sodium chloride  flush (NS) 0.9 % injection 10-40 mL, 10-40 mL, Intracatheter, PRN,  Trudy Anthony HERO, MD   sodium chloride  flush (NS) 0.9 % injection 10-40 mL, 10-40 mL, Intracatheter, Q12H, Trudy Anthony HERO, MD, 10 mL at 07/26/24 1046  Allergies: Allergies  Allergen Reactions   Penicillins Rash

## 2024-07-26 NOTE — Progress Notes (Signed)
 Called report to [facility] at [phone]. Spoke to Harlene Sharps RN assuming care. Leaving via [facility].

## 2024-07-26 NOTE — Plan of Care (Signed)
  Problem: Skin Integrity: Goal: Risk for impaired skin integrity will decrease Outcome: Progressing   Problem: Activity: Goal: Risk for activity intolerance will decrease Outcome: Progressing   Problem: Pain Managment: Goal: General experience of comfort will improve and/or be controlled Outcome: Progressing   Problem: Safety: Goal: Ability to remain free from injury will improve Outcome: Progressing   Problem: Skin Integrity: Goal: Risk for impaired skin integrity will decrease Outcome: Progressing

## 2024-07-26 NOTE — Discharge Summary (Signed)
 Physician Discharge Summary  Macaiah Mangal Wilk FMW:[id number] DOB: 1941-07-19 DOA: 07/20/2024  PCP: Zenaida Morene PARAS, MD  Admit date: 07/20/2024 Discharge date: 07/26/2024  Admitted From: SNF Disposition:  SNF  Recommendations for Outpatient Follow-up:  Follow up with PCP in 1-2 weeks F/u w/ cardio, Dr. Gollan, in 1-2 weeks  Home Health: no  Equipment/Devices:  Discharge Condition: stable  CODE STATUS:DNR Diet recommendation: Heart Healthy / Carb Modified  Brief/Interim Summary: HPI was taken from Dr. Cleatus:  Sherryll Skoczylas Branscom is a 83 y.o. female with medical history significant for COPD, chronic HFrEF, HTN, IDDM, PAF on Eliquis , CKD stage IIIa, dementia, being admitted with left lower extremity cellulitis and electrolyte derangement presenting with altered mental status and increased agitation.  She was sent in by her facility [facility]. On arrival she was hypothermic to 95.8 mildly tachycardic to 120 with otherwise normal vitals. Hypokalemia of 2.9 and hypomagnesemia of 1.5 TSH normal with slightly elevated T4 1.92 Creatinine at 1.31, slightly above baseline of 1.08 with mild metabolic acidosis of 21 and increased anion gap of 19 CK normal WBC normal, lactic acid normal, UA not consistent with infection and chest x-ray not done EKG showed a junctional rhythm at 67 and prolonged QT of 656 Patient treated with an NS bolus, oral potassium Rocephin  started Placed on Bair hugger   Admission requested     Discharge Diagnoses:  Principal Problem:   Acute metabolic encephalopathy Active Problems:   Cellulitis of left lower extremity   Dementia with behavioral disturbance (HCC)   Hypokalemia   Hypomagnesemia   Prolonged QT interval   Chronic HFrEF (heart failure with reduced ejection fraction) (HCC)   Hypertension   Stage 3a chronic kidney disease (HCC)   Paroxysmal atrial fibrillation (HCC)   Diabetes mellitus without complication (HCC)   Hypothyroidism   COPD (chronic  obstructive pulmonary disease) (HCC)  Acute metabolic encephalopathy: likely secondary to infection, LLE cellulitis. Hx of dementia. Re-orient prn. Completed abx course   Dementia: w/ behavioral disturbance. Still refusing to take most meds. Does not meet inpatient psych criteria as per psych. Psych signed off 07/21/24. Haldol prn    Cellulitis of left lower extremity: w/ superficial skin ulcer. Completed abx course    Hypokalemia: WNL today    Hypomagnesemia: WNL today    Prolonged QT interval: continue on tele.    Chronic systolic CHF: appears euvolemic. Continue on home dose of losartan , metoprolol , torsemide . Monitor I/Os   HTN: continue on metoprolol , losartan     CKDIIIa: Cr is labile. Avoid nephrotoxic meds    PAF: continue on metoprolol , eliquis    DM2: fair control, HbA1c 7.1. Restart home anti-DM meds at d/c   Hypothyroidism: continue on home dose of levothyroxine     COPD: w/o exacerbation. Bronchodilators prn   Discharge Instructions  Discharge Instructions     Diet - low sodium heart healthy   Complete by: As directed    Diet Carb Modified   Complete by: As directed    Discharge instructions   Complete by: As directed    F/u w/ PCP in 1-2 weeks. F/u w/ cardio, Dr. Gollan, in 1-2 weeks   Increase activity slowly   Complete by: As directed       Allergies as of 07/26/2024       Reactions   Penicillins Rash        Medication List     STOP taking these medications    Digoxin  62.5 MCG Tabs       TAKE  these medications    acetaminophen  650 MG CR tablet Commonly known as: TYLENOL  Take 650 mg by mouth every 8 (eight) hours as needed for pain.   apixaban  5 MG Tabs tablet Commonly known as: ELIQUIS  Take 1 tablet (5 mg total) by mouth 2 (two) times daily.   ascorbic acid  500 MG tablet Commonly known as: VITAMIN C  Take 500 mg by mouth daily.   calcium carbonate 1500 (600 Ca) MG Tabs tablet Commonly known as: OSCAL Take 1,500 mg by mouth in  the morning and at bedtime.   cefadroxil 500 MG capsule Commonly known as: DURICEF Take 1 capsule (500 mg total) by mouth 2 (two) times daily for 2 days. Start taking on: July 27, 2024   docusate sodium  100 MG capsule Commonly known as: COLACE Take 100 mg by mouth 2 (two) times daily.   feeding supplement Liqd Take 237 mLs by mouth 2 (two) times daily between meals.   Fish Oil 300 MG Caps Take 300-1,000 mg by mouth daily.   fluticasone  50 MCG/ACT nasal spray Commonly known as: FLONASE  Place 1 spray into both nostrils daily.   insulin  glargine-yfgn 100 UNIT/ML injection Commonly known as: SEMGLEE  Inject 0.08 mLs (8 Units total) into the skin daily.   levothyroxine  75 MCG tablet Commonly known as: SYNTHROID  Take 75 mcg by mouth daily before breakfast.   loratadine  10 MG tablet Commonly known as: CLARITIN  Take 10 mg by mouth daily.   LORazepam  0.5 MG tablet Commonly known as: ATIVAN  Take 0.5 mg by mouth 2 (two) times daily as needed for sedation.   losartan  50 MG tablet Commonly known as: COZAAR  Take 50 mg by mouth daily.   lovastatin 10 MG tablet Commonly known as: MEVACOR TAKE 1 TABLET ONCE A DAY ORALLY   metoprolol  tartrate 25 MG tablet Commonly known as: LOPRESSOR  Take 1 tablet (25 mg total) by mouth 2 (two) times daily.   multivitamin with minerals Tabs tablet Take 1 tablet by mouth daily.   nystatin powder Apply 1 Application topically 2 (two) times daily. Apply Nystatin powder to red area under right breast and place ABD pad between breast and chest twice daily   ondansetron  4 MG disintegrating tablet Commonly known as: ZOFRAN -ODT Take 4 mg by mouth every 8 (eight) hours as needed for nausea or vomiting.   phenol 1.4 % Liqd Commonly known as: CHLORASEPTIC Use as directed 1 spray in the mouth or throat every 2 (two) hours as needed for throat irritation / pain.   polyethylene glycol 17 g packet Commonly known as: MIRALAX  / GLYCOLAX  Take 17 g by  mouth daily as needed for mild constipation.   torsemide  20 MG tablet Commonly known as: DEMADEX  Take 1 tablet (20 mg total) by mouth daily.   triamcinolone  ointment 0.5 % Commonly known as: KENALOG  Apply 1 Application topically 2 (two) times daily.   trolamine salicylate 10 % cream Commonly known as: ASPERCREME Apply 1 Application topically 3 (three) times daily. Apply to right knee three times a day.        Contact information for follow-up providers     Zenaida Morene PARAS, MD Follow up.   Specialty: Cardiology Why: Hospital follow up Contact information: [address], [address] [place] [address] [phone]              Contact information for after-discharge care     Destination     [facility] [place], [place]. [place]   Service: Skilled Paramedic information: [address]  [place]  [address] [phone]                    Allergies  Allergen Reactions   Penicillins Rash    Consultations: Psych    Procedures/Studies: ECHOCARDIOGRAM LIMITED Result Date: 06/30/2024    ECHOCARDIOGRAM LIMITED REPORT   Patient Name:   ZIANA HEYLIGER Date of Exam: 06/30/2024 Medical Rec #:  [id number]    Height:       65.0 in Accession #:    [id number]   Weight:       163.8 lb Date of Birth:  1941-04-29    BSA:          1.817 m Patient Age:    83 years     BP:           145/65 mmHg Patient Gender: F            HR:           73 bpm. Exam Location:  [facility] Procedure: Limited Echo, Color Doppler and Cardiac Doppler (Both Spectral and            Color Flow Doppler were utilized during procedure). Indications:     Cardiomyopathy-unspecified I42.9  History:         Patient has prior history of Echocardiogram examinations, most                  recent 05/12/2024. CHF; Risk Factors:Diabetes and Hypertension.  Sonographer:     Christopher Furnace Referring Phys:  [phone] CHRISTOPHER END Diagnosing Phys: Evalene Lunger MD  Sonographer Comments: Technically challenging  study due to limited acoustic windows and no apical window. IMPRESSIONS  1. Left ventricular ejection fraction, by estimation, is 55 to 60%. The left ventricle has normal function. The left ventricle has no regional wall motion abnormalities. Left ventricular diastolic parameters are indeterminate.  2. Right ventricular systolic function is normal. The right ventricular size is normal. There is normal pulmonary artery systolic pressure. The estimated right ventricular systolic pressure is 32.5 mmHg.  3. The mitral valve is normal in structure. Mild to moderate mitral valve regurgitation. Unable to exclude stenosis, no gradient measured.  4. The aortic valve is normal in structure. There is moderate calcification of the aortic valve. Aortic valve regurgitation is not visualized. Aortic valve sclerosis/calcification is present, unable to exclude stenosis as gradient not measured.  5. The inferior vena cava is normal in size with greater than 50% respiratory variability, suggesting right atrial pressure of 3 mmHg. FINDINGS  Left Ventricle: Left ventricular ejection fraction, by estimation, is 55 to 60%. The left ventricle has normal function. The left ventricle has no regional wall motion abnormalities. The left ventricular internal cavity size was normal in size. There is  no left ventricular hypertrophy. Left ventricular diastolic parameters are indeterminate. Right Ventricle: The right ventricular size is normal. No increase in right ventricular wall thickness. Right ventricular systolic function is normal. There is normal pulmonary artery systolic pressure. The tricuspid regurgitant velocity is 2.62 m/s, and  with an assumed right atrial pressure of 5 mmHg, the estimated right ventricular systolic pressure is 32.5 mmHg. Left Atrium: Left atrial size was normal in size. Right Atrium: Right atrial size was normal in size. Pericardium: There is no evidence of pericardial effusion. Mitral Valve: The mitral valve is  normal in structure. There is moderate calcification of the mitral valve leaflet(s). Mild mitral annular calcification. Mild to moderate mitral valve regurgitation. No evidence of mitral valve stenosis.  Tricuspid Valve: The tricuspid valve is normal in structure. Tricuspid valve regurgitation is not demonstrated. No evidence of tricuspid stenosis. Aortic Valve: The aortic valve is normal in structure. There is moderate calcification of the aortic valve. Aortic valve regurgitation is not visualized. Aortic valve sclerosis/calcification is present, without any evidence of aortic stenosis. Pulmonic Valve: The pulmonic valve was normal in structure. Pulmonic valve regurgitation is not visualized. No evidence of pulmonic stenosis. Aorta: The aortic root is normal in size and structure. Venous: The inferior vena cava is normal in size with greater than 50% respiratory variability, suggesting right atrial pressure of 3 mmHg. IAS/Shunts: No atrial level shunt detected by color flow Doppler. Additional Comments: Spectral Doppler performed. Color Doppler performed.  LEFT VENTRICLE PLAX 2D LVIDd:         4.90 cm LVIDs:         2.80 cm LV PW:         0.80 cm LV IVS:        1.40 cm LVOT diam:     2.00 cm LVOT Area:     3.14 cm  LEFT ATRIUM         Index LA diam:    4.20 cm 2.31 cm/m   AORTA Ao Root diam: 2.70 cm TRICUSPID VALVE TR Peak grad:   27.5 mmHg TR Vmax:        262.00 cm/s  SHUNTS Systemic Diam: 2.00 cm Evalene Lunger MD Electronically signed by Evalene Lunger MD Signature Date/Time: 06/30/2024/5:28:03 PM    Final    CT Cervical Spine Wo Contrast Result Date: 06/27/2024 EXAM: CT CERVICAL SPINE WITHOUT CONTRAST 06/27/2024 11:43:07 AM TECHNIQUE: CT of the cervical spine was performed without the administration of intravenous contrast. Multiplanar reformatted images are provided for review. Automated exposure control, iterative reconstruction, and/or weight based adjustment of the mA/kV was utilized to reduce the  radiation dose to as low as reasonably achievable. COMPARISON: CT of the cervical spine dated 01/03/2018. CLINICAL HISTORY: Neck trauma (Age >= 65y). Pt brought in by EMS from home for AMS/confusion. Per family, pt kicked them out of the house the other day. Family cleaned the house when they left. Today the house is trashed and pt is accusing family of moving things so she cannot find them. FINDINGS: CERVICAL SPINE: BONES AND ALIGNMENT: No acute fracture or traumatic malalignment. There is incomplete fusion of the atlantoaxial joint. There is incomplete fusion of the facet joints throughout the cervical spine, except at C6-C7. At C6-C7, there is slight degenerative anterolisthesis. DEGENERATIVE CHANGES: At C6-C7, there is moderate bilateral facet arthrosis. Otherwise, no significant degenerative changes. SOFT TISSUES: No prevertebral soft tissue swelling. IMPRESSION: 1. No acute abnormality of the cervical spine related to the reported neck trauma. 2. Degenerative changes at C6-7 with slight anterolisthesis and moderate bilateral facet arthrosis. Electronically signed by: Evalene Coho MD 06/27/2024 11:58 AM EDT RP Workstation: [id number]   CT HEAD WO CONTRAST ( ) Result Date: 06/27/2024 EXAM: CT HEAD WITHOUT CONTRAST 06/27/2024 11:43:07 AM TECHNIQUE: CT of the head was performed without the administration of intravenous contrast. Automated exposure control, iterative reconstruction, and/or weight based adjustment of the mA/kV was utilized to reduce the radiation dose to as low as reasonably achievable. COMPARISON: None available. CLINICAL HISTORY: Head trauma, minor (Age >= 65y). Pt brought in by EMS from home for AMS/confusion. Per family, pt kicked them out of the house the other day. Family cleaned the house when they left. Today the house is trashed and pt is accusing family  of moving things so she cannot find them. FINDINGS: BRAIN AND VENTRICLES: No acute hemorrhage. No evidence of acute infarct.  No hydrocephalus. No extra-axial collection. No mass effect or midline shift. Age-related atrophy. Mild periventricular white matter disease. Calcification within the carotid siphons. ORBITS: No acute abnormality. The patient is status post bilateral lens replacement. SINUSES: No acute abnormality. SOFT TISSUES AND SKULL: No acute soft tissue abnormality. No skull fracture. IMPRESSION: 1. No acute intracranial abnormality. 2. Age-related cerebral atrophy and mild chronic microvascular white matter disease. Electronically signed by: Evalene Coho MD 06/27/2024 11:54 AM EDT RP Workstation: [id number]   DG Chest Portable 1 View Result Date: 06/27/2024 EXAM: 1 VIEW(S) XRAY OF THE CHEST 06/27/2024 10:51:36 AM COMPARISON: 05/09/2024 CLINICAL HISTORY: AMS. Pt brought in by EMS from home for AMS/confusion. Per family, pt kicked them out of the house the other day. Family cleaned the house when they left. Today the house is trashed and pt is accusing family of moving things so she cannot find them. FINDINGS: LUNGS AND PLEURA: There are mildly prominent interstitial markings present bilaterally. There has been interval improved aeration of the left lung base. No focal pulmonary opacity. No pulmonary edema. No pleural effusion. No pneumothorax. HEART AND MEDIASTINUM: Mild cardiomegaly, stable. Aortic atherosclerosis. BONES AND SOFT TISSUES: No acute osseous abnormality. IMPRESSION: 1. Mildly prominent bilateral interstitial markings. 2. Interval improved aeration of the left lung base. 3. Mild cardiomegaly, stable. Electronically signed by: Evalene Coho MD 06/27/2024 11:14 AM EDT RP Workstation: [id number]   (Echo, Carotid, EGD, Colonoscopy, ERCP)    Subjective: Pt is pleasantly confused   Discharge Exam: Vitals:   07/26/24 0353 07/26/24 0805  BP: 126/87 124/62  Pulse: 70 66  Resp:  14  Temp: 98 F (36.7 C) 97.7 F (36.5 C)  SpO2: 100% 100%   Vitals:   07/25/24 1300 07/25/24 1558 07/26/24 0353  07/26/24 0805  BP: 132/83 139/66 126/87 124/62  Pulse: 85 80 70 66  Resp: 16 17  14   Temp: 98.6 F (37 C)  98 F (36.7 C) 97.7 F (36.5 C)  TempSrc: Oral   Oral  SpO2: 100% 100% 100% 100%  Weight:      Height:        General: Pt is alert, awake, not in acute distress Cardiovascular: S1/S2 +, no rubs, no gallops Respiratory: CTA bilaterally, no wheezing, no rhonchi Abdominal: Soft, NT, ND, bowel sounds + Extremities: no cyanosis    The results of significant diagnostics from this hospitalization (including imaging, microbiology, ancillary and laboratory) are listed below for reference.     Microbiology: Recent Results (from the past 240 hours)  Culture, blood (routine x 2)     Status: None   Collection Time: 07/21/24  2:15 AM   Specimen: BLOOD  Result Value Ref Range Status   Specimen Description BLOOD RIGHT ARM  Final   Special Requests   Final    BOTTLES DRAWN AEROBIC AND ANAEROBIC Blood Culture results may not be optimal due to an inadequate volume of blood received in culture bottles   Culture   Final    NO GROWTH 5 DAYS Performed at [facility], [address] [place], [place] [address]    Report Status 07/26/2024 FINAL  Final  Culture, blood (routine x 2)     Status: None   Collection Time: 07/21/24  2:16 AM   Specimen: BLOOD  Result Value Ref Range Status   Specimen Description BLOOD LEFT ARM  Final   Special Requests  Final    BOTTLES DRAWN AEROBIC AND ANAEROBIC Blood Culture adequate volume   Culture   Final    NO GROWTH 5 DAYS Performed at [facility], [address] [place], [place] [address]    Report Status 07/26/2024 FINAL  Final     Labs: BNP (last 3 results) Recent Labs    01/28/24 0914 05/09/24 0420 06/27/24 0957  BNP 251.9* 337.3* 162.7*   Basic Metabolic Panel: Recent Labs  Lab 07/22/24 0419 07/23/24 0344 07/24/24 0520 07/25/24 0551 07/26/24 0547  NA 138 140 135 137 137  K 3.9 3.6 3.7 3.3* 3.6  CL 105  109 103 107 105  CO2 25 24 23  21* 22  GLUCOSE 105* 99 105* 89 89  BUN 10 9 9 9 9   CREATININE 1.08* 1.17* 1.02* 1.25* 1.21*  CALCIUM 8.5* 8.4* 8.3* 8.3* 8.3*  MG 1.7 1.5* 2.1 1.9 1.9  PHOS 2.7 3.4 4.1 3.9 2.9   Liver Function Tests: Recent Labs  Lab 07/20/24 1755  AST 43*  ALT 33  ALKPHOS 39  BILITOT 1.1  PROT 7.3  ALBUMIN 3.9   No results for input(s): LIPASE, AMYLASE in the last 168 hours. No results for input(s): AMMONIA in the last 168 hours. CBC: Recent Labs  Lab 07/22/24 0419 07/23/24 0344 07/24/24 0520 07/25/24 0551 07/26/24 0547  WBC 7.9 5.6 8.1 5.7 6.6  HGB 12.0 11.4* 12.7 11.2* 11.7*  HCT 38.0 35.3* 39.5 34.2* 35.9*  MCV 92.0 91.5 91.0 90.7 89.8  PLT 218 175 245 187 234   Cardiac Enzymes: Recent Labs  Lab 07/20/24 1939  CKTOTAL 61   BNP: Invalid input(s): POCBNP CBG: Recent Labs  Lab 07/25/24 0804 07/25/24 1246 07/25/24 1716 07/25/24 1940 07/26/24 0807  GLUCAP 76 91 71 99 81   D-Dimer No results for input(s): DDIMER in the last 72 hours. Hgb A1c No results for input(s): HGBA1C in the last 72 hours. Lipid Profile No results for input(s): CHOL, HDL, LDLCALC, TRIG, CHOLHDL, LDLDIRECT in the last 72 hours. Thyroid  function studies No results for input(s): TSH, T4TOTAL, T3FREE, THYROIDAB in the last 72 hours.  Invalid input(s): FREET3 Anemia work up No results for input(s): VITAMINB12, FOLATE, FERRITIN, TIBC, IRON, RETICCTPCT in the last 72 hours. Urinalysis    Component Value Date/Time   COLORURINE YELLOW (A) 07/20/2024 1931   APPEARANCEUR CLEAR (A) 07/20/2024 1931   LABSPEC 1.017 07/20/2024 1931   PHURINE 5.0 07/20/2024 1931   GLUCOSEU NEGATIVE 07/20/2024 1931   HGBUR NEGATIVE 07/20/2024 1931   BILIRUBINUR NEGATIVE 07/20/2024 1931   KETONESUR 5 (A) 07/20/2024 1931   PROTEINUR 30 (A) 07/20/2024 1931   NITRITE NEGATIVE 07/20/2024 1931   LEUKOCYTESUR NEGATIVE 07/20/2024 1931   Sepsis  Labs Recent Labs  Lab 07/23/24 0344 07/24/24 0520 07/25/24 0551 07/26/24 0547  WBC 5.6 8.1 5.7 6.6   Microbiology Recent Results (from the past 240 hours)  Culture, blood (routine x 2)     Status: None   Collection Time: 07/21/24  2:15 AM   Specimen: BLOOD  Result Value Ref Range Status   Specimen Description BLOOD RIGHT ARM  Final   Special Requests   Final    BOTTLES DRAWN AEROBIC AND ANAEROBIC Blood Culture results may not be optimal due to an inadequate volume of blood received in culture bottles   Culture   Final    NO GROWTH 5 DAYS Performed at [facility], [address], [place], [place] [address]    Report Status 07/26/2024 FINAL  Final  Culture, blood (routine x 2)     Status: None   Collection Time: 07/21/24  2:16 AM   Specimen: BLOOD  Result Value Ref Range Status   Specimen Description BLOOD LEFT ARM  Final   Special Requests   Final    BOTTLES DRAWN AEROBIC AND ANAEROBIC Blood Culture adequate volume   Culture   Final    NO GROWTH 5 DAYS Performed at [facility], [address], [place], [place] [address]    Report Status 07/26/2024 FINAL  Final     Time coordinating discharge: 33 minutes  SIGNED:   Anthony CHRISTELLA Pouch, MD  [facility] 07/26/2024, 1:03 PM Pager   If 7PM-7AM, please contact night-coverage [email or web address]

## 2024-07-26 NOTE — TOC Transition Note (Addendum)
 Transition of Care [facility]) - Discharge Note   Patient Details  Name: Tanya Frye MRN: [id number] Date of Birth: Nov 28, 1940  [facility] CM/SW Contact:  Dalia GORMAN Fuse, RN Phone Number: 07/26/2024, 1:44 PM   Clinical Narrative:     Patient is medically clear to transition back to LTC bed at [facility]. TOC spoke with the patient's daughter in law and she is in agreement with the discharge plan. [facility] to transport.    Barriers to Discharge: Continued Medical Work up   Patient Goals and CMS Choice     Choice offered to / list presented to : Adult Children      Discharge Placement                       Discharge Plan and Services Additional resources added to the After Visit Summary for       Post Acute Care Choice: Resumption of Svcs/PTA Provider                               Social Drivers of Health (SDOH) Interventions SDOH Screenings   Food Insecurity: No Food Insecurity (06/27/2024)  Housing: Unknown (06/27/2024)  Transportation Needs: Patient Unable To Answer (07/21/2024)  Utilities: Patient Unable To Answer (07/21/2024)  Alcohol Screen: Low Risk  (09/05/2021)  Depression (PHQ2-9): Low Risk  (06/14/2024)  Financial Resource Strain: Low Risk  (09/05/2021)  Physical Activity: Inactive (09/05/2021)  Social Connections: Patient Unable To Answer (07/21/2024)  Stress: No Stress Concern Present (09/05/2021)  Tobacco Use: Low Risk  (07/20/2024)     Readmission Risk Interventions     No data to display

## 2024-07-26 NOTE — Progress Notes (Signed)
 PT Cancellation Note  Patient Details Name: Tanya Frye MRN: [id number] DOB: 26-Sep-1940   Cancelled Treatment:    Reason Eval/Treat Not Completed: Patient declined, no reason specified Patient declined and states I was just out of the bed with the nursing staff, I'm too tired now to do anything. Physical therapy will check back at later time.    Jazmene Racz A Maudry Zeidan 07/26/2024, 10:09 AM

## 2024-07-26 NOTE — Progress Notes (Signed)
 Mobility Specialist Progress Note:    07/26/24 0957  Mobility  Activity Stood at bedside;Pivoted/transferred to/from [facility]  Level of Assistance Minimal assist, patient does 75% or more  Assistive Device BSC  Range of Motion/Exercises Active;All extremities  Activity Response Tolerated well  Mobility visit 1 Mobility  Mobility Specialist Start Time (ACUTE ONLY) [id number]  Mobility Specialist Stop Time (ACUTE ONLY) 0935  Mobility Specialist Time Calculation (min) (ACUTE ONLY) 10 min   Pt received requesting assistance. Required MinA to stand and transfer with one person hand-held assist. Tolerated well, asx throughout. Left on BSC with call bell per pt's request. All needs met.  Sherrilee Ditty Mobility Specialist Please contact via Special Educational Needs Teacher or  Rehab office at [phone]

## 2024-07-26 NOTE — Progress Notes (Signed)
 Occupational Therapy Treatment Patient Details Name: Tanya Frye MRN: [id number] DOB: 11-Oct-1940 Today's Date: 07/26/2024   History of present illness Tanya Frye is a 83 y.o. female with medical history significant for COPD, chronic HFrEF, HTN, IDDM, PAF on Eliquis , CKD stage IIIa, dementia, being admitted with left lower extremity cellulitis and electrolyte derangement presenting with altered mental status and increased agitation.  She was sent in by her facility [facility].   OT comments  Upon entering the room, pt supine in bed and performs supine >sit with supervision. Pt is very demanding and asking therapist to move things around in room and throw items away. Therapist asking her to change gown. When gown is untied, she becomes agitated and starts yelling to put it on the chair and she isn't going to do it. Unable to redirect pt. She asks for washcloth and cleans tray table but perseverates on it and is unable to be redirected. She refuses to engage in any other activity. Session terminated at this time. Bed alarm activated. Call bell within reach.       If plan is discharge home, recommend the following:  A little help with walking and/or transfers;A little help with bathing/dressing/bathroom;Assistance with cooking/housework;Direct supervision/assist for medications management;Direct supervision/assist for financial management;Assist for transportation;Supervision due to cognitive status;Help with stairs or ramp for entrance   Equipment Recommendations  Other (comment) (defer to next venue of care)       Precautions / Restrictions Precautions Precautions: Fall Recall of Precautions/Restrictions: Impaired       Mobility Bed Mobility Overal bed mobility: Needs Assistance Bed Mobility: Supine to Sit     Supine to sit: Supervision          Transfers                         Balance Overall balance assessment: Modified Independent Sitting-balance support:  Feet supported, No upper extremity supported Sitting balance-Leahy Scale: Good                                     ADL either performed or assessed with clinical judgement   ADL                                         General ADL Comments: pt refusal    Extremity/Trunk Assessment Upper Extremity Assessment Upper Extremity Assessment: Generalized weakness   Lower Extremity Assessment Lower Extremity Assessment: Generalized weakness        Vision Patient Visual Report: No change from baseline           Communication Communication Communication: Impaired Factors Affecting Communication: Hearing impaired   Cognition Arousal: Alert Behavior During Therapy: Restless, Agitated Cognition: Cognition impaired, History of cognitive impairments   Orientation impairments: Place, Situation, Time Awareness: Intellectual awareness impaired       OT - Cognition Comments: pt with low frusteration tolerance, does not allow OT to initiate tasks, pt is self-directed throughout session and argumentitive                 Following commands: Impaired Following commands impaired: Follows one step commands inconsistently      Cueing   Cueing Techniques: Verbal cues, Tactile cues, Gestural cues  Pertinent Vitals/ Pain       Pain Assessment Pain Assessment: No/denies pain         Frequency  Min 1X/week        Progress Toward Goals  OT Goals(current goals can now be found in the care plan section)  Progress towards OT goals: Progressing toward goals      AM-PAC OT 6 Clicks Daily Activity     Outcome Measure   Help from another person eating meals?: None Help from another person taking care of personal grooming?: None Help from another person toileting, which includes using toliet, bedpan, or urinal?: A Little Help from another person bathing (including washing, rinsing, drying)?: A Little Help from another person to  put on and taking off regular upper body clothing?: A Little Help from another person to put on and taking off regular lower body clothing?: A Little 6 Click Score: 20    End of Session    OT Visit Diagnosis: Other abnormalities of gait and mobility (R26.89);Muscle weakness (generalized) (M62.81);Other symptoms and signs involving cognitive function   Activity Tolerance Other (comment) (limited by cognition)   Patient Left in bed;with call bell/phone within reach;with bed alarm set   Nurse Communication Mobility status        Time: 8872-8857 OT Time Calculation (min): 15 min  Charges: OT General Charges $OT Visit: 1 Visit OT Treatments $Therapeutic Activity: 8-22 mins  Izetta Claude, MS, OTR/L , CBIS ascom [phone]  07/26/24, 12:44 PM

## 2024-08-09 ENCOUNTER — Ambulatory Visit: Admitting: Family

## 2024-09-01 ENCOUNTER — Telehealth: Payer: Self-pay | Admitting: Family

## 2024-09-01 NOTE — Telephone Encounter (Signed)
 Called to confirm/remind patient of their appointment at [facility] on 09/02/24.   Appointment:   [x] Confirmed  [] Left mess   [] No answer/No voice mail  [] VM Full/unable to leave message  [] Phone not in service  Patient reminded to bring all medications and/or complete list.  Confirmed patient has transportation. Gave directions, instructed to utilize valet parking.

## 2024-09-02 ENCOUNTER — Encounter: Payer: Self-pay | Admitting: Family

## 2024-09-02 ENCOUNTER — Ambulatory Visit: Attending: Family | Admitting: Family

## 2024-09-02 VITALS — BP 132/63 | HR 97 | Wt 160.4 lb

## 2024-09-02 DIAGNOSIS — I480 Paroxysmal atrial fibrillation: Secondary | ICD-10-CM

## 2024-09-02 DIAGNOSIS — I420 Dilated cardiomyopathy: Secondary | ICD-10-CM | POA: Diagnosis not present

## 2024-09-02 MED ORDER — TORSEMIDE 20 MG PO TABS: ORAL_TABLET | ORAL | 1 refills | Status: AC

## 2024-09-02 NOTE — Progress Notes (Signed)
 ADVANCED HEART FAILURE FOLLOW UP CLINIC NOTE  Referring Physician:  Primary Care:  Primary Cardiologist: HF Provider: Odis Brownie, MD  Chief Complaint: fatigue   HPI: Tanya Frye is a 83 y.o. female who presents for follow up of chronic systolic heart failure.   Patient  presented to the hospital in May 2025 with shortness of breath.  She developed profound cardiogenic shock requiring multiple vasopressors and inotropes.  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  After prolonged ventilatory wean and aggressive IV diuresis she was extubated after discussion with palliative care and hospice. She improved slowly throughout the hospitalization and was discharged to rehab.  Admitted 05/09/24 with worsening SOB. HR 119 (sinus tach), CXR with vascular congestion and left-sided pleural effusion. She was given Duonebs, Lasix , and Solumedrol with concern for CHF exacerbation. O2 sats here not <93%, stable on RA. Echo 05/12/24: EF 30-35%, mildly reduced RV, severe LAE, left lateral moderate pleural effusion, moderate/severe MR, mild/ moderate TR. Seen by cardiology, treated with digoxin , beta-blocker and amiodarone  for atrial flutter. IV diuresed.   Admitted 06/27/24 with aggressive behavior, diarrhea and frequent accidents of loose bowel movements over the past 3 to 4 days. Feeling weak, apparently she was taking a whole lot of stool softeners and laxative after being at home. Decreased p.o. intake for the past 2 days. Potassium of 2.6 and magnesium  of 1.6. TSH normal, UA with significant proteinuria, ketonuria, positive nitrite and leukocytes. UDS positive for benzodiazepine. Psych consult obtained. Echo 06/30/24: EF 55-60%, normal RV, mild/ moderate MR. Persistent borderline bradycardia, home metoprolol  and amiodarone  both were discontinued by cardiology. Oral diuresing done as patient lost IV access and didn't want it replaced. Hold amiodarone / digoxin . Awaiting SNF placement.   Admitted  07/20/24 with admitted with left lower extremity cellulitis and electrolyte derangement presenting with altered mental status and increased agitation. Hypothermic to 95.8 mildly tachycardic to 120 with otherwise normal vitals. Hypokalemia of 2.9 and hypomagnesemia of 1.5. EKG showed a junctional rhythm at 67 and prolonged QT of 656. Patient treated with an NS bolus, oral potassium. Rocephin  started. Placed on [employer] Patient refusing to take meds, psych consulted.   SUBJECTIVE:  She presents today for a HF follow-up visit with a chief complaint of minimal fatigue. Denies shortness of breath, chest pain, palpitations, dizziness, swelling or difficulty sleeping. Says that she's able to get herself from her wheelchair to the commode / bed without assistance by using grab bars. Overall, she says that she's feeling good and that she's being taken well cared for.   ROS: All systems negative except what is listed in HPI, PMH and Problem List  PMH, current medications, allergies, social history, and family history reviewed in epic.  PHYSICAL EXAM:  General: Frail appearing elderly female in wheelchair.  Cor: No JVD. Irregular rhythm, rate.  Lungs: clear Abdomen: soft, nontender, nondistended. Extremities: 2+ pitting edema BLE Neuro:. 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  Fairly well compensated currently, symptoms difficult to differentiate from debility following long ICU admission. - NYHA class II - fluid up with pedal edema - Increase torsemide  to 40mg  M, W, F & 20mg  the other 4 days of the week. Staff to put compression socks on daily w/ removal at bedtime. Weigh daily.  - Continue  losartan  50mg  daily - BB has been held due to bradycardia at recent admission. Consider resuming at next  visit - holding spironolactone   due to recent hypotension. Consider resuming at future visits - BMET 07/26/24 reviewed: sodium 137, potassium 3.6, creatinine 1.21, GFR 45 - order written for facility to do BMET in 10 days and fax results to our office - BNP 06/27/24 was 162.7  Afib: Persistent, rate controlled at this time.  Risk-benefit discussion previously with patient and family, would not pursue cardioversion or procedures at this time. - Amiodarone  being held due to recent bradycardia - Consider starting beta-blocker at next visit - Continue apixaban  5 mg twice daily  DMII:  - Continue insulin  - being managed by PCP at SNF   Return in 6 weeks, sooner if needed.   I spent 35 minutes reviewing records, interviewing/ examing patient and managing plan/ orders.   [name] Class [facility] 09/02/2024

## 2024-09-20 ENCOUNTER — Ambulatory Visit: Admitting: Cardiology

## 2024-09-21 ENCOUNTER — Ambulatory Visit: Admitting: Cardiology

## 2024-10-14 ENCOUNTER — Ambulatory Visit: Admitting: Family

## 2024-10-22 ENCOUNTER — Telehealth: Payer: Self-pay | Admitting: Family

## 2024-10-22 NOTE — Telephone Encounter (Signed)
 Called to confirm/remind patient of their appointment at the [facility] on 10/25/24.  Appt in question due to weather transportation will call back   Appointment:   [] Confirmed  [] Left mess   [] No answer/No voice mail  [] VM Full/unable to leave message  [] Phone not in service  Patient reminded to bring all medications and/or complete list.  Confirmed patient has transportation. Gave directions, instructed to utilize valet parking.

## 2024-10-24 NOTE — Progress Notes (Unsigned)
 "  ADVANCED HEART FAILURE FOLLOW UP CLINIC NOTE  Referring Physician:  Primary Care:  Primary Cardiologist: HF Provider: Odis Brownie, MD  Chief Complaint: fatigue   HPI: Tanya Frye is a 84 y.o. female who presents for follow up of chronic systolic heart failure.   Patient  presented to the hospital in May 2025 with shortness of breath.  She developed profound cardiogenic shock requiring multiple vasopressors and inotropes.  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  After prolonged ventilatory wean and aggressive IV diuresis she was extubated after discussion with palliative care and hospice. She improved slowly throughout the hospitalization and was discharged to rehab.  Admitted 05/09/24 with worsening SOB. HR 119 (sinus tach), CXR with vascular congestion and left-sided pleural effusion. She was given Duonebs, Lasix , and Solumedrol with concern for CHF exacerbation. O2 sats here not <93%, stable on RA. Echo 05/12/24: EF 30-35%, mildly reduced RV, severe LAE, left lateral moderate pleural effusion, moderate/severe MR, mild/ moderate TR. Seen by cardiology, treated with digoxin , beta-blocker and amiodarone  for atrial flutter. IV diuresed.   Admitted 06/27/24 with aggressive behavior, diarrhea and frequent accidents of loose bowel movements over the past 3 to 4 days. Feeling weak, apparently she was taking a whole lot of stool softeners and laxative after being at home. Decreased p.o. intake for the past 2 days. Potassium of 2.6 and magnesium  of 1.6. TSH normal, UA with significant proteinuria, ketonuria, positive nitrite and leukocytes. UDS positive for benzodiazepine. Psych consult obtained. Echo 06/30/24: EF 55-60%, normal RV, mild/ moderate MR. Persistent borderline bradycardia, home metoprolol  and amiodarone  both were discontinued by cardiology. Oral diuresing done as patient lost IV access and didn't want it replaced. Hold amiodarone / digoxin . Awaiting SNF placement.   Admitted  07/20/24 with admitted with left lower extremity cellulitis and electrolyte derangement presenting with altered mental status and increased agitation. Hypothermic to 95.8 mildly tachycardic to 120 with otherwise normal vitals. Hypokalemia of 2.9 and hypomagnesemia of 1.5. EKG showed a junctional rhythm at 67 and prolonged QT of 656. Patient treated with an NS bolus, oral potassium. Rocephin  started. Placed on [employer] Patient refusing to take meds, psych consulted.   SUBJECTIVE:  She presents today for a HF follow-up visit with a chief complaint of minimal fatigue. Denies shortness of breath, chest pain, palpitations, dizziness, swelling or difficulty sleeping. Says that she's able to get herself from her wheelchair to the commode / bed without assistance by using grab bars. Overall, she says that she's feeling good and that she's being taken well cared for.   ROS: All systems negative except what is listed in HPI, PMH and Problem List  PMH, current medications, allergies, social history, and family history reviewed in epic.  PHYSICAL EXAM:  General: Frail appearing elderly female in wheelchair.  Cor: No JVD. Irregular rhythm, rate.  Lungs: clear Abdomen: soft, nontender, nondistended. Extremities: 2+ pitting edema BLE Neuro:. 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  Fairly well compensated currently, symptoms difficult to differentiate from debility following long ICU admission. - NYHA class II - fluid up with pedal edema - Increase torsemide  to 40mg  M, W, F & 20mg  the other 4 days of the week. Staff to put compression socks on daily w/ removal at bedtime. Weigh daily.  - Continue  losartan  50mg  daily - BB has been held due to bradycardia at recent admission. Consider resuming at  next visit - holding spironolactone   due to recent hypotension. Consider resuming at future visits - BMET 07/26/24 reviewed: sodium 137, potassium 3.6, creatinine 1.21, GFR 45 - order written for facility to do BMET in 10 days and fax results to our office - BNP 06/27/24 was 162.7  Afib: Persistent, rate controlled at this time.  Risk-benefit discussion previously with patient and family, would not pursue cardioversion or procedures at this time. - Amiodarone  being held due to recent bradycardia - Consider starting beta-blocker at next visit - Continue apixaban  5 mg twice daily  DMII:  - Continue insulin  - being managed by PCP at SNF   Return in 6 weeks, sooner if needed.   I spent 35 minutes reviewing records, interviewing/ examing patient and managing plan/ orders.   [name] Class FNP-C 10/24/24 "

## 2024-10-25 ENCOUNTER — Ambulatory Visit: Admitting: Family

## 2024-10-26 ENCOUNTER — Emergency Department

## 2024-10-26 ENCOUNTER — Emergency Department: Admission: EM | Admit: 2024-10-26 | Discharge: 2024-10-26 | Discharge status: Against Medical Advice

## 2024-10-26 ENCOUNTER — Other Ambulatory Visit: Payer: Self-pay

## 2024-10-26 DIAGNOSIS — Z5321 Procedure and treatment not carried out due to patient leaving prior to being seen by health care provider: Secondary | ICD-10-CM | POA: Insufficient documentation

## 2024-10-26 DIAGNOSIS — M7989 Other specified soft tissue disorders: Secondary | ICD-10-CM | POA: Insufficient documentation

## 2024-10-26 LAB — BASIC METABOLIC PANEL WITH GFR
Anion gap: 11 (ref 5–15)
BUN: 30 mg/dL — ABNORMAL HIGH (ref 8–23)
CO2: 29 mmol/L (ref 22–32)
Calcium: 9.1 mg/dL (ref 8.9–10.3)
Chloride: 98 mmol/L (ref 98–111)
Creatinine, Ser: 1.46 mg/dL — ABNORMAL HIGH (ref 0.44–1.00)
GFR, Estimated: 36 mL/min — ABNORMAL LOW
Glucose, Bld: 107 mg/dL — ABNORMAL HIGH (ref 70–99)
Potassium: 4.6 mmol/L (ref 3.5–5.1)
Sodium: 139 mmol/L (ref 135–145)

## 2024-10-26 LAB — CBC
HCT: 35.7 % — ABNORMAL LOW (ref 36.0–46.0)
Hemoglobin: 11.3 g/dL — ABNORMAL LOW (ref 12.0–15.0)
MCH: 28.5 pg (ref 26.0–34.0)
MCHC: 31.7 g/dL (ref 30.0–36.0)
MCV: 89.9 fL (ref 80.0–100.0)
Platelets: 223 10*3/uL (ref 150–400)
RBC: 3.97 MIL/uL (ref 3.87–5.11)
RDW: 14.3 % (ref 11.5–15.5)
WBC: 7.2 10*3/uL (ref 4.0–10.5)
nRBC: 0 % (ref 0.0–0.2)

## 2024-10-26 LAB — PRO BRAIN NATRIURETIC PEPTIDE: Pro Brain Natriuretic Peptide: 1133 pg/mL — ABNORMAL HIGH

## 2024-10-27 ENCOUNTER — Telehealth: Payer: Self-pay | Admitting: Emergency Medicine

## 2024-10-27 NOTE — Telephone Encounter (Signed)
 Called the [facility] [place] and left message for them to call me back.  Called patient due to left emergency department before provider exam to inquire about condition and follow up plans. Spoke with grandson Tanya Frye.  He didn't know her pcp and gave me his mothers number.  Called Tanya Frye and she said they use the facility provider.  She is going over there now.  She agrees to inform staff that Tanya Frye labs need to be reviewed by their provider.

## 2024-11-09 ENCOUNTER — Ambulatory Visit: Admitting: Family
